# Patient Record
Sex: Female | Born: 1970
Health system: Southern US, Community
[De-identification: ages and names within clinical notes are randomized; demographics above are authoritative.]

## PROBLEM LIST (undated history)

## (undated) DIAGNOSIS — M5430 Sciatica, unspecified side: Secondary | ICD-10-CM

## (undated) DIAGNOSIS — M719 Bursopathy, unspecified: Secondary | ICD-10-CM

## (undated) DIAGNOSIS — R569 Unspecified convulsions: Secondary | ICD-10-CM

## (undated) DIAGNOSIS — F329 Major depressive disorder, single episode, unspecified: Secondary | ICD-10-CM

## (undated) DIAGNOSIS — F4310 Post-traumatic stress disorder, unspecified: Secondary | ICD-10-CM

## (undated) DIAGNOSIS — D259 Leiomyoma of uterus, unspecified: Secondary | ICD-10-CM

## (undated) DIAGNOSIS — I10 Essential (primary) hypertension: Secondary | ICD-10-CM

## (undated) DIAGNOSIS — M1990 Unspecified osteoarthritis, unspecified site: Secondary | ICD-10-CM

## (undated) DIAGNOSIS — M62838 Other muscle spasm: Secondary | ICD-10-CM

## (undated) DIAGNOSIS — K219 Gastro-esophageal reflux disease without esophagitis: Secondary | ICD-10-CM

## (undated) DIAGNOSIS — F32A Depression, unspecified: Secondary | ICD-10-CM

## (undated) DIAGNOSIS — F419 Anxiety disorder, unspecified: Secondary | ICD-10-CM

## (undated) HISTORY — PX: OTHER SURGICAL HISTORY: SHX169

---

## 1998-01-03 ENCOUNTER — Other Ambulatory Visit: Admission: RE | Admit: 1998-01-03 | Discharge: 1998-01-03 | Payer: Self-pay | Admitting: Obstetrics

## 1998-11-27 ENCOUNTER — Other Ambulatory Visit: Admission: RE | Admit: 1998-11-27 | Discharge: 1998-11-27 | Payer: Self-pay | Admitting: Registered Nurse

## 1999-02-18 ENCOUNTER — Inpatient Hospital Stay (HOSPITAL_COMMUNITY): Admission: AD | Admit: 1999-02-18 | Discharge: 1999-02-18 | Payer: Self-pay | Admitting: Obstetrics

## 1999-03-08 ENCOUNTER — Inpatient Hospital Stay (HOSPITAL_COMMUNITY): Admission: AD | Admit: 1999-03-08 | Discharge: 1999-03-08 | Payer: Self-pay | Admitting: Obstetrics

## 1999-04-08 ENCOUNTER — Encounter: Payer: Self-pay | Admitting: Emergency Medicine

## 1999-04-08 ENCOUNTER — Emergency Department (HOSPITAL_COMMUNITY): Admission: EM | Admit: 1999-04-08 | Discharge: 1999-04-08 | Payer: Self-pay | Admitting: Emergency Medicine

## 1999-05-27 ENCOUNTER — Inpatient Hospital Stay (HOSPITAL_COMMUNITY): Admission: AD | Admit: 1999-05-27 | Discharge: 1999-05-27 | Payer: Self-pay | Admitting: Obstetrics

## 1999-05-28 ENCOUNTER — Inpatient Hospital Stay (HOSPITAL_COMMUNITY): Admission: AD | Admit: 1999-05-28 | Discharge: 1999-05-28 | Payer: Self-pay | Admitting: Obstetrics

## 1999-06-22 ENCOUNTER — Inpatient Hospital Stay (HOSPITAL_COMMUNITY): Admission: AD | Admit: 1999-06-22 | Discharge: 1999-06-24 | Payer: Self-pay | Admitting: Obstetrics

## 1999-06-22 ENCOUNTER — Encounter (INDEPENDENT_AMBULATORY_CARE_PROVIDER_SITE_OTHER): Payer: Self-pay | Admitting: Specialist

## 2000-04-28 ENCOUNTER — Emergency Department (HOSPITAL_COMMUNITY): Admission: EM | Admit: 2000-04-28 | Discharge: 2000-04-28 | Payer: Self-pay | Admitting: Internal Medicine

## 2000-10-05 ENCOUNTER — Ambulatory Visit (HOSPITAL_COMMUNITY): Admission: RE | Admit: 2000-10-05 | Discharge: 2000-10-05 | Payer: Self-pay | Admitting: *Deleted

## 2000-10-06 ENCOUNTER — Ambulatory Visit (HOSPITAL_COMMUNITY): Admission: RE | Admit: 2000-10-06 | Discharge: 2000-10-06 | Payer: Self-pay | Admitting: *Deleted

## 2000-10-12 ENCOUNTER — Ambulatory Visit (HOSPITAL_COMMUNITY): Admission: RE | Admit: 2000-10-12 | Discharge: 2000-10-12 | Payer: Self-pay | Admitting: *Deleted

## 2000-10-18 ENCOUNTER — Emergency Department (HOSPITAL_COMMUNITY): Admission: EM | Admit: 2000-10-18 | Discharge: 2000-10-19 | Payer: Self-pay | Admitting: Emergency Medicine

## 2003-07-24 ENCOUNTER — Encounter: Admission: RE | Admit: 2003-07-24 | Discharge: 2003-08-24 | Payer: Self-pay | Admitting: Family Medicine

## 2003-08-06 ENCOUNTER — Encounter: Admission: RE | Admit: 2003-08-06 | Discharge: 2003-11-04 | Payer: Self-pay | Admitting: Family Medicine

## 2004-08-15 ENCOUNTER — Emergency Department (HOSPITAL_COMMUNITY): Admission: EM | Admit: 2004-08-15 | Discharge: 2004-08-15 | Payer: Self-pay | Admitting: Emergency Medicine

## 2004-10-23 ENCOUNTER — Ambulatory Visit (HOSPITAL_COMMUNITY): Admission: RE | Admit: 2004-10-23 | Discharge: 2004-10-23 | Payer: Self-pay | Admitting: Obstetrics

## 2004-10-31 ENCOUNTER — Ambulatory Visit (HOSPITAL_COMMUNITY): Admission: RE | Admit: 2004-10-31 | Discharge: 2004-10-31 | Payer: Self-pay | Admitting: Internal Medicine

## 2005-09-08 ENCOUNTER — Emergency Department (HOSPITAL_COMMUNITY): Admission: EM | Admit: 2005-09-08 | Discharge: 2005-09-08 | Payer: Self-pay | Admitting: *Deleted

## 2006-12-08 ENCOUNTER — Emergency Department (HOSPITAL_COMMUNITY): Admission: EM | Admit: 2006-12-08 | Discharge: 2006-12-09 | Payer: Self-pay | Admitting: Emergency Medicine

## 2007-07-25 ENCOUNTER — Emergency Department: Payer: Self-pay | Admitting: Emergency Medicine

## 2007-08-05 ENCOUNTER — Emergency Department (HOSPITAL_COMMUNITY): Admission: EM | Admit: 2007-08-05 | Discharge: 2007-08-05 | Payer: Self-pay | Admitting: Emergency Medicine

## 2008-07-20 ENCOUNTER — Emergency Department (HOSPITAL_COMMUNITY): Admission: EM | Admit: 2008-07-20 | Discharge: 2008-07-20 | Payer: Self-pay | Admitting: Emergency Medicine

## 2008-12-26 ENCOUNTER — Emergency Department (HOSPITAL_COMMUNITY): Admission: EM | Admit: 2008-12-26 | Discharge: 2008-12-26 | Payer: Self-pay | Admitting: Emergency Medicine

## 2009-02-20 ENCOUNTER — Emergency Department (HOSPITAL_COMMUNITY): Admission: EM | Admit: 2009-02-20 | Discharge: 2009-02-20 | Payer: Self-pay | Admitting: Emergency Medicine

## 2009-12-02 ENCOUNTER — Ambulatory Visit (HOSPITAL_COMMUNITY): Admission: RE | Admit: 2009-12-02 | Discharge: 2009-12-02 | Payer: Self-pay | Admitting: Internal Medicine

## 2010-01-18 ENCOUNTER — Emergency Department (HOSPITAL_COMMUNITY): Admission: EM | Admit: 2010-01-18 | Discharge: 2010-01-19 | Payer: Self-pay | Admitting: Emergency Medicine

## 2010-04-02 ENCOUNTER — Emergency Department (HOSPITAL_COMMUNITY)
Admission: EM | Admit: 2010-04-02 | Discharge: 2010-04-02 | Payer: Self-pay | Source: Home / Self Care | Admitting: Emergency Medicine

## 2010-05-04 ENCOUNTER — Encounter: Payer: Self-pay | Admitting: Obstetrics

## 2010-05-29 ENCOUNTER — Emergency Department (HOSPITAL_COMMUNITY): Payer: Medicaid Other

## 2010-05-29 ENCOUNTER — Emergency Department (HOSPITAL_COMMUNITY)
Admission: EM | Admit: 2010-05-29 | Discharge: 2010-05-29 | Disposition: A | Discharge status: Home / Self Care | Payer: Medicaid Other | Attending: Emergency Medicine | Admitting: Emergency Medicine

## 2010-05-29 DIAGNOSIS — R059 Cough, unspecified: Secondary | ICD-10-CM | POA: Insufficient documentation

## 2010-05-29 DIAGNOSIS — F329 Major depressive disorder, single episode, unspecified: Secondary | ICD-10-CM | POA: Insufficient documentation

## 2010-05-29 DIAGNOSIS — M069 Rheumatoid arthritis, unspecified: Secondary | ICD-10-CM | POA: Insufficient documentation

## 2010-05-29 DIAGNOSIS — K219 Gastro-esophageal reflux disease without esophagitis: Secondary | ICD-10-CM | POA: Insufficient documentation

## 2010-05-29 DIAGNOSIS — R05 Cough: Secondary | ICD-10-CM | POA: Insufficient documentation

## 2010-05-29 DIAGNOSIS — I10 Essential (primary) hypertension: Secondary | ICD-10-CM | POA: Insufficient documentation

## 2010-05-29 DIAGNOSIS — F3289 Other specified depressive episodes: Secondary | ICD-10-CM | POA: Insufficient documentation

## 2010-05-29 DIAGNOSIS — J069 Acute upper respiratory infection, unspecified: Secondary | ICD-10-CM | POA: Insufficient documentation

## 2010-06-23 LAB — POCT I-STAT, CHEM 8
Calcium, Ion: 1.18 mmol/L (ref 1.12–1.32)
Glucose, Bld: 90 mg/dL (ref 70–99)
HCT: 39 % (ref 36.0–46.0)
Sodium: 141 mEq/L (ref 135–145)

## 2010-06-26 LAB — WET PREP, GENITAL: Trich, Wet Prep: NONE SEEN

## 2010-06-26 LAB — GC/CHLAMYDIA PROBE AMP, GENITAL: Chlamydia, DNA Probe: NEGATIVE

## 2010-07-23 LAB — CBC
HCT: 39.2 % (ref 36.0–46.0)
Hemoglobin: 13.1 g/dL (ref 12.0–15.0)
MCV: 84.4 fL (ref 78.0–100.0)
RBC: 4.65 MIL/uL (ref 3.87–5.11)

## 2010-07-23 LAB — URINALYSIS, ROUTINE W REFLEX MICROSCOPIC
Ketones, ur: NEGATIVE mg/dL
Protein, ur: NEGATIVE mg/dL

## 2010-07-23 LAB — DIFFERENTIAL
Basophils Relative: 2 % — ABNORMAL HIGH (ref 0–1)
Eosinophils Absolute: 0.1 10*3/uL (ref 0.0–0.7)
Lymphocytes Relative: 17 % (ref 12–46)
Lymphs Abs: 1.8 10*3/uL (ref 0.7–4.0)
Monocytes Relative: 4 % (ref 3–12)
Neutro Abs: 8.2 10*3/uL — ABNORMAL HIGH (ref 1.7–7.7)
Neutrophils Relative %: 77 % (ref 43–77)

## 2010-07-23 LAB — POCT I-STAT, CHEM 8
BUN: 4 mg/dL — ABNORMAL LOW (ref 6–23)
Calcium, Ion: 1.18 mmol/L (ref 1.12–1.32)
Chloride: 106 mEq/L (ref 96–112)
HCT: 43 % (ref 36.0–46.0)
Hemoglobin: 14.6 g/dL (ref 12.0–15.0)
Potassium: 4.1 mEq/L (ref 3.5–5.1)

## 2010-07-23 LAB — GC/CHLAMYDIA PROBE AMP, GENITAL
Chlamydia, DNA Probe: NEGATIVE
GC Probe Amp, Genital: NEGATIVE

## 2010-07-23 LAB — WET PREP, GENITAL: Clue Cells Wet Prep HPF POC: NONE SEEN

## 2010-07-23 LAB — POCT PREGNANCY, URINE: Preg Test, Ur: NEGATIVE

## 2010-08-29 NOTE — Op Note (Signed)
[facility]  Patient:    Margaret Owens, Margaret Owens                      MRN: [id number] Proc. Date: 06/23/99 Adm. Date:  [id number] Attending:  Venita Sheffield                           Operative Report  PREOPERATIVE DIAGNOSIS:       Multiparity.  OPERATION:                    Postpartum tubal ligation.  ANESTHESIA:                   Epidural.  DESCRIPTION OF PROCEDURE:     Using epidural with the patient in the supine position the abdomen was prepared and draped.  The bladder was emptied with a straight catheter.  A midline subumbilical incision was made and carried out to the fascia and the fascia cleaned, grasped with two corticals of the fascia and the  peritoneum opened with the Mayo scissors.  The left tube was traced to the fimbriated end.  A 0 plain suture was placed in the mesosalpinx below the portion of tube within the Babcock clamp.  Approximately 1 inch of tube was transected ith hemostasis satisfactory.  The procedure was done in a similar fashion on the other side.  The abdomen closed ______ peritoneum fascia continuous of 0 Dexon and the skin closed with subcuticular suture of 3-0 plain.  The patient tolerated the procedure well and was taken to the recovery room in good condition. DD:  06/23/99 TD:  06/23/99 Job: [id number]

## 2010-08-29 NOTE — Discharge Summary (Signed)
[facility]  Patient:    Margaret Owens, Margaret Owens                      MRN: [id number] Adm. Date:  [id number] Disc. Date: 06/24/99 Attending:  Venita Sheffield                           Discharge Summary  HISTORY OF PRESENT ILLNESS:   The patient is a 40 year old, gravida 3, para 2-0-0-2, [facility] June 23, 1999, admitted in labor.  HOSPITAL COURSE:              She had a normal spontaneous vaginal delivery of  female, Apgars of 9 and 9.  First degree perineal repaired with 2-0 Vicryl.  The patient did well and desired a postpartum tubal ligation.  This she underwent on March 12.  Her hemoglobin was 11.1.  She did well and was discharged home on the second postpartum day, ambulatory, on a regular diet, and on Tylenol No.3 one q.4h. p.r.n.  She is to see me in six weeks.  DISCHARGE DIAGNOSIS:          Status post normal spontaneous vaginal delivery at term and postpartum tubal ligation. DD:  06/24/99 TD:  06/24/99 Job: [id number]

## 2011-01-06 LAB — RPR: RPR Ser Ql: NONREACTIVE

## 2011-01-06 LAB — URINALYSIS, ROUTINE W REFLEX MICROSCOPIC
Glucose, UA: NEGATIVE
Protein, ur: NEGATIVE
Specific Gravity, Urine: 1.031 — ABNORMAL HIGH

## 2011-01-06 LAB — WET PREP, GENITAL
Trich, Wet Prep: NONE SEEN
Yeast Wet Prep HPF POC: NONE SEEN

## 2011-01-06 LAB — POCT PREGNANCY, URINE: Operator id: 28014

## 2011-01-06 LAB — GC/CHLAMYDIA PROBE AMP, GENITAL: Chlamydia, DNA Probe: NEGATIVE

## 2011-01-23 LAB — COMPREHENSIVE METABOLIC PANEL
ALT: 13
AST: 16
CO2: 26
Calcium: 9.5
GFR calc Af Amer: >60
GFR calc non Af Amer: >60
Glucose, Bld: 97
Potassium: 4.2
Sodium: 140
Total Protein: 8.1

## 2011-01-23 LAB — COMPREHENSIVE METABOLIC PANEL WITH GFR
Albumin: 3.7
Alkaline Phosphatase: 67
BUN: 6
Chloride: 107
Creatinine, Ser: 0.63
Total Bilirubin: 0.6

## 2011-01-23 LAB — URINALYSIS, ROUTINE W REFLEX MICROSCOPIC
Bilirubin Urine: NEGATIVE
Glucose, UA: NEGATIVE
Hgb urine dipstick: NEGATIVE
Ketones, ur: NEGATIVE
Nitrite: NEGATIVE
Protein, ur: NEGATIVE
Specific Gravity, Urine: 1.021
Urobilinogen, UA: 1
pH: 6

## 2011-01-23 LAB — DIFFERENTIAL
Basophils Absolute: 0.1
Basophils Relative: 1
Eosinophils Absolute: 0.2
Eosinophils Relative: 2
Lymphocytes Relative: 27
Lymphs Abs: 2.8
Monocytes Absolute: 0.6
Monocytes Relative: 5
Neutro Abs: 6.8
Neutrophils Relative %: 65

## 2011-01-23 LAB — CBC
HCT: 38
Hemoglobin: 12.7
MCHC: 33.5
MCV: 83.5
Platelets: 445 — ABNORMAL HIGH
RBC: 4.55
RDW: 14.1 — ABNORMAL HIGH
WBC: 10.5

## 2011-01-23 LAB — RPR: RPR Ser Ql: NONREACTIVE

## 2011-01-23 LAB — GC/CHLAMYDIA PROBE AMP, GENITAL
Chlamydia, DNA Probe: NEGATIVE
GC Probe Amp, Genital: NEGATIVE

## 2011-01-23 LAB — WET PREP, GENITAL: Yeast Wet Prep HPF POC: NONE SEEN

## 2011-01-23 LAB — LIPASE, BLOOD: Lipase: 18

## 2011-01-23 LAB — POCT PREGNANCY, URINE
Operator id: 244461
Preg Test, Ur: NEGATIVE

## 2011-07-23 ENCOUNTER — Other Ambulatory Visit (HOSPITAL_COMMUNITY): Payer: Self-pay | Admitting: Obstetrics

## 2011-07-23 DIAGNOSIS — Z1231 Encounter for screening mammogram for malignant neoplasm of breast: Secondary | ICD-10-CM

## 2011-08-20 ENCOUNTER — Ambulatory Visit (HOSPITAL_COMMUNITY)
Admission: RE | Admit: 2011-08-20 | Discharge: 2011-08-20 | Disposition: A | Discharge status: Home / Self Care | Payer: Medicaid Other | Source: Ambulatory Visit | Attending: Obstetrics | Admitting: Obstetrics

## 2011-08-20 DIAGNOSIS — Z1231 Encounter for screening mammogram for malignant neoplasm of breast: Secondary | ICD-10-CM | POA: Insufficient documentation

## 2011-08-30 ENCOUNTER — Emergency Department (HOSPITAL_COMMUNITY)
Admission: EM | Admit: 2011-08-30 | Discharge: 2011-08-30 | Disposition: A | Discharge status: Home / Self Care | Payer: Medicaid Other | Attending: Emergency Medicine | Admitting: Emergency Medicine

## 2011-08-30 ENCOUNTER — Encounter (HOSPITAL_COMMUNITY): Payer: Self-pay

## 2011-08-30 DIAGNOSIS — S025XXA Fracture of tooth (traumatic), initial encounter for closed fracture: Secondary | ICD-10-CM

## 2011-08-30 DIAGNOSIS — M129 Arthropathy, unspecified: Secondary | ICD-10-CM | POA: Insufficient documentation

## 2011-08-30 DIAGNOSIS — I10 Essential (primary) hypertension: Secondary | ICD-10-CM | POA: Insufficient documentation

## 2011-08-30 DIAGNOSIS — J45909 Unspecified asthma, uncomplicated: Secondary | ICD-10-CM | POA: Insufficient documentation

## 2011-08-30 DIAGNOSIS — X58XXXA Exposure to other specified factors, initial encounter: Secondary | ICD-10-CM | POA: Insufficient documentation

## 2011-08-30 HISTORY — DX: Bursopathy, unspecified: M71.9

## 2011-08-30 HISTORY — DX: Unspecified osteoarthritis, unspecified site: M19.90

## 2011-08-30 HISTORY — DX: Essential (primary) hypertension: I10

## 2011-08-30 HISTORY — DX: Gastro-esophageal reflux disease without esophagitis: K21.9

## 2011-08-30 NOTE — ED Notes (Signed)
Pt states eating peanuts and broke tooth. Today having increase in pain.

## 2011-08-30 NOTE — ED Notes (Signed)
Pt ambulates without distress. NAD

## 2011-08-30 NOTE — Discharge Instructions (Signed)
Apply orthodontic wax to your broken tooth to make it smooth.  Take ibuprofen for tongue irritation.  Call the dentist in the morning to schedule a follow up appointment.  You may return to the ER if your symptoms change or worsen.

## 2011-08-30 NOTE — ED Provider Notes (Signed)
History     CSN: [id number]  Arrival date & time 08/30/11  1112   First MD Initiated Contact with Patient 08/30/11 1224      Chief Complaint  Patient presents with  . Dental Injury    (Consider location/radiation/quality/duration/timing/severity/associated sxs/prior treatment) HPI History provided by pt.   Pt broke a piece of left lower tooth off yesterday evening while eating [employer].  Tooth is not painful but remaining tooth is sharp and irritating the left side of her tongue.  She is unable to eat d/t irritation.  No associated sx. Has not taken anything for pain.  Does not currently have a dentist.    Past Medical History  Diagnosis Date  . Arthritis   . Asthma   . Bursitis   . GERD (gastroesophageal reflux disease)   . Hypertension     No past surgical history on file.  Family History  Problem Relation Age of Onset  . Osteoarthritis Mother   . Cancer Mother     History  Substance Use Topics  . Smoking status: Never Smoker   . Smokeless tobacco: Not on file  . Alcohol Use: No    OB History    Grav Para Term Preterm Abortions TAB SAB Ect Mult Living                  Review of Systems  All other systems reviewed and are negative.    Allergies  Penicillins  Home Medications   Current Outpatient Rx  Name Route Sig Dispense Refill  . CETIRIZINE HCL 10 MG PO TABS Oral Take 10 mg by mouth daily.    Marland Kitchen CITALOPRAM HYDROBROMIDE 10 MG PO TABS Oral Take 10 mg by mouth daily.    Marland Kitchen CLONIDINE HCL 0.1 MG PO TABS Oral Take 0.1 mg by mouth 2 (two) times daily.    Marland Kitchen FLUTICASONE PROPIONATE 50 MCG/ACT NA SUSP Nasal Place 2 sprays into the nose daily.    . MELOXICAM 15 MG PO TABS Oral Take 15 mg by mouth daily.    Marland Kitchen OMEPRAZOLE 20 MG PO CPDR Oral Take 20 mg by mouth daily.    Marland Kitchen PHENTERMINE HCL 37.5 MG PO CAPS Oral Take 37.5 mg by mouth every morning.    . TRAZODONE HCL 50 MG PO TABS Oral Take 50 mg by mouth at bedtime.      BP 114/60  Pulse 74  Temp(Src)  98.6 F (37 C) (Oral)  Resp 18  Ht 5\' 9"  (1.753 m)  Wt 235 lb (106.595 kg)  BMI 34.70 kg/m2  SpO2 100%  LMP 08/11/2011  Physical Exam  Nursing note and vitals reviewed. Constitutional: She is oriented to person, place, and time. She appears well-developed and well-nourished. No distress.  HENT:  Head: Normocephalic and atraumatic.       Partial avulsion medial left lower 1st molar.  Mild decay of remaining tooth.  Mildly ttp.  Tongue appears nml.    Eyes:       Normal appearance  Neck: Normal range of motion.  Pulmonary/Chest: Effort normal.  Musculoskeletal: Normal range of motion.  Neurological: She is alert and oriented to person, place, and time.  Psychiatric: She has a normal mood and affect. Her behavior is normal.    ED Course  Procedures (including critical care time)  Labs Reviewed - No data to display No results found.   1. Avulsion fracture of tooth       MDM  41yo F presents w/ partial  avulsion left lower 1st molar and remaining fragment is irritating her tongue.  Recommended ortho wax to cover tooth and chloraseptic spray to relieve tongue pain.  Referred to dentist on call and provided w/ list of several low cost dentists.        Arie Sabina [place], [place] 08/30/11 1701

## 2011-09-02 NOTE — ED Provider Notes (Signed)
Medical screening examination/treatment/procedure(s) were performed by non-physician practitioner and as supervising physician I was immediately available for consultation/collaboration.  Noe Goyer, MD 09/02/11 0910 

## 2011-12-18 ENCOUNTER — Other Ambulatory Visit: Payer: Self-pay | Admitting: Sports Medicine

## 2011-12-18 DIAGNOSIS — M545 Low back pain, unspecified: Secondary | ICD-10-CM

## 2011-12-18 DIAGNOSIS — M25559 Pain in unspecified hip: Secondary | ICD-10-CM

## 2011-12-24 ENCOUNTER — Other Ambulatory Visit: Payer: Medicaid Other

## 2011-12-24 ENCOUNTER — Inpatient Hospital Stay: Admission: RE | Admit: 2011-12-24 | Payer: Medicaid Other | Source: Ambulatory Visit

## 2012-01-14 ENCOUNTER — Ambulatory Visit: Payer: Medicaid Other | Attending: Sports Medicine | Admitting: Physical Therapy

## 2012-01-14 DIAGNOSIS — R293 Abnormal posture: Secondary | ICD-10-CM | POA: Insufficient documentation

## 2012-01-14 DIAGNOSIS — M2550 Pain in unspecified joint: Secondary | ICD-10-CM | POA: Insufficient documentation

## 2012-01-14 DIAGNOSIS — IMO0001 Reserved for inherently not codable concepts without codable children: Secondary | ICD-10-CM | POA: Insufficient documentation

## 2012-01-29 ENCOUNTER — Ambulatory Visit: Payer: Medicaid Other | Admitting: Rehabilitation

## 2012-02-05 ENCOUNTER — Ambulatory Visit: Payer: Medicaid Other | Admitting: Rehabilitation

## 2012-02-12 ENCOUNTER — Encounter: Payer: Medicaid Other | Admitting: Rehabilitation

## 2012-02-19 ENCOUNTER — Ambulatory Visit: Payer: Medicaid Other | Attending: Sports Medicine | Admitting: Rehabilitation

## 2012-02-19 DIAGNOSIS — R293 Abnormal posture: Secondary | ICD-10-CM | POA: Insufficient documentation

## 2012-02-19 DIAGNOSIS — M2550 Pain in unspecified joint: Secondary | ICD-10-CM | POA: Insufficient documentation

## 2012-02-19 DIAGNOSIS — IMO0001 Reserved for inherently not codable concepts without codable children: Secondary | ICD-10-CM | POA: Insufficient documentation

## 2012-10-23 ENCOUNTER — Encounter (HOSPITAL_COMMUNITY): Payer: Self-pay

## 2012-10-23 ENCOUNTER — Emergency Department (HOSPITAL_COMMUNITY)
Admission: EM | Admit: 2012-10-23 | Discharge: 2012-10-23 | Disposition: A | Discharge status: Home / Self Care | Payer: Medicaid Other | Attending: Emergency Medicine | Admitting: Emergency Medicine

## 2012-10-23 DIAGNOSIS — K219 Gastro-esophageal reflux disease without esophagitis: Secondary | ICD-10-CM | POA: Insufficient documentation

## 2012-10-23 DIAGNOSIS — G8929 Other chronic pain: Secondary | ICD-10-CM | POA: Insufficient documentation

## 2012-10-23 DIAGNOSIS — B351 Tinea unguium: Secondary | ICD-10-CM | POA: Insufficient documentation

## 2012-10-23 DIAGNOSIS — Z79899 Other long term (current) drug therapy: Secondary | ICD-10-CM | POA: Insufficient documentation

## 2012-10-23 DIAGNOSIS — Z880 Allergy status to penicillin: Secondary | ICD-10-CM | POA: Insufficient documentation

## 2012-10-23 DIAGNOSIS — IMO0002 Reserved for concepts with insufficient information to code with codable children: Secondary | ICD-10-CM | POA: Insufficient documentation

## 2012-10-23 DIAGNOSIS — M2550 Pain in unspecified joint: Secondary | ICD-10-CM | POA: Insufficient documentation

## 2012-10-23 DIAGNOSIS — M549 Dorsalgia, unspecified: Secondary | ICD-10-CM | POA: Insufficient documentation

## 2012-10-23 DIAGNOSIS — J45909 Unspecified asthma, uncomplicated: Secondary | ICD-10-CM | POA: Insufficient documentation

## 2012-10-23 DIAGNOSIS — M25559 Pain in unspecified hip: Secondary | ICD-10-CM | POA: Insufficient documentation

## 2012-10-23 DIAGNOSIS — I10 Essential (primary) hypertension: Secondary | ICD-10-CM | POA: Insufficient documentation

## 2012-10-23 MED ORDER — CICLOPIROX OLAMINE 0.77 % EX CREA: TOPICAL_CREAM | Freq: Two times a day (BID) | CUTANEOUS | Status: DC

## 2012-10-23 MED ORDER — MELOXICAM 15 MG PO TABS: 15.0000 mg | ORAL_TABLET | Freq: Every day | ORAL | Status: DC

## 2012-10-23 MED ORDER — HYDROCODONE-ACETAMINOPHEN 5-325 MG PO TABS: 1.0000 | ORAL_TABLET | Freq: Four times a day (QID) | ORAL | Status: DC | PRN

## 2012-10-23 NOTE — ED Notes (Signed)
MD at bedside.  EDPA Herbert Seta present to evaluate this pt

## 2012-10-23 NOTE — ED Notes (Signed)
She c/o worsening of chronic bilat. Hip pain x ~2 weeks.  She sees Dr. Farris Has (ortho) for this.  She also tells [place] she has run out of her hydrocodone recently.  She denies any recent trauma, but cites much walking.

## 2012-10-23 NOTE — ED Provider Notes (Signed)
History  This chart was scribed for non-physician practitioner working with Gerhard Munch, MD by Greggory Stallion, ED scribe. This patient was seen in room WTR7/WTR7 and the patient's care was started at 3:58 PM.  CSN: [id number] Arrival date & time 10/23/12  1205   Chief Complaint  Patient presents with  . Hip Pain   The history is provided by the patient and medical records. No language interpreter was used.    HPI Comments: Margaret Owens is a 42 y.o. female with h/o HTN and arthritis who presents to the Emergency Department complaining of chronic bilateral hip pain that started 2 weeks ago. She states the pain feels like she pulled a muscle. Pt denies injury or trauma. She states she has been having to walk a lot more because she doesn't have her car. 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 She is treated by Dr. Farris Has for her pain management. She also takes Mobic daily and uses both here and gel which are helping. Walking makes the pain worse. She also reports mild low back pain. Pt states the 1st digit on her right foot has some pain because her toenail is loose. She states she has not snagged her toe on anything. Pt states she has run out of her hydrocodone recently. Pt states the orthopedist is calling in more medication. She states she does not have an appointment any time soon with Dr. Farris Has.   Orthopaedist is Dr. Pati Gallo   Past Medical History  Diagnosis Date  . Arthritis   . Asthma   . Bursitis   . GERD (gastroesophageal reflux disease)   . Hypertension    History reviewed. No pertinent past surgical history. Family History  Problem Relation Age of Onset  . Osteoarthritis Mother   . Cancer Mother    History  Substance Use Topics  . Smoking status: Never Smoker   . Smokeless tobacco: Not on file  . Alcohol Use: No   OB History   Grav Para Term Preterm Abortions TAB SAB Ect Mult Living                  Review of Systems  Musculoskeletal: Positive for back pain and arthralgias. Negative for joint swelling.  All other systems reviewed and are negative.    Allergies  Penicillins  Home Medications   Current Outpatient Rx  Name  Route  Sig  Dispense  Refill  . albuterol (PROVENTIL HFA;VENTOLIN HFA) 108 (90 BASE) MCG/ACT inhaler   Inhalation   Inhale 2 puffs into the lungs every 6 (six) hours as needed for wheezing.         . cetirizine (ZYRTEC) 10 MG tablet   Oral   Take 10 mg by mouth daily.         . citalopram (CELEXA) 10 MG tablet   Oral   Take 10 mg by mouth daily.         . diclofenac sodium (VOLTAREN) 1 % GEL   Topical   Apply 2 g topically 4 (four) times daily as needed (apply to leg).         . fluticasone (FLONASE) 50 MCG/ACT nasal spray   Nasal   Place 2 sprays into the nose daily.         Marland Kitchen omeprazole (PRILOSEC) 20 MG capsule   Oral   Take 20 mg by mouth daily.         . phentermine 37.5  MG capsule   Oral   Take 37.5 mg by mouth every morning.         . traZODone (DESYREL) 50 MG tablet   Oral   Take 50 mg by mouth at bedtime.         . ciclopirox (LOPROX) 0.77 % cream   Topical   Apply topically 2 (two) times daily.   15 g   0   . HYDROcodone-acetaminophen (NORCO/VICODIN) 5-325 MG per tablet   Oral   Take 1 tablet by mouth every 6 (six) hours as needed for pain.   10 tablet   0   . meloxicam (MOBIC) 15 MG tablet   Oral   Take 1 tablet (15 mg total) by mouth daily.   30 tablet   0    BP 122/86  Pulse 73  Temp(Src) 98.4 F (36.9 C) (Oral)  Resp 14  SpO2 100%  Physical Exam  Nursing note and vitals reviewed. Constitutional: She appears well-developed and well-nourished. No distress.  HENT:  Head: Normocephalic and atraumatic.  Mouth/Throat: Oropharynx is clear and moist. No oropharyngeal exudate.  Eyes: Conjunctivae are normal.  Neck: Normal range of motion. Neck supple.  Full ROM without pain No paraspinal or  midline tenderness  Cardiovascular: Normal rate, regular rhythm, normal heart sounds and intact distal pulses.   No murmur heard. Capillary refill < 3   Pulmonary/Chest: Effort normal and breath sounds normal. No respiratory distress. She has no wheezes.  Clear and equal breath sounds.   Musculoskeletal: She exhibits tenderness. She exhibits no edema.  Full range of motion of the T-spine and L-spine No tenderness to palpation of the spinous processes of the T-spine or L-spine Mild tenderness to palpation of the paraspinous muscles of the L-spine ROM: full ROM of hips and knees.  Tender to palpation on lateral hips.   Lymphadenopathy:    She has no cervical adenopathy.  Neurological: She is alert. She has normal reflexes. Coordination normal.  Speech is clear and goal oriented, follows commands Normal strength in upper and lower extremities bilaterally including dorsiflexion and plantar flexion, strong and equal grip strength Sensation normal to light and sharp touch Moves extremities without ataxia, coordination intact Normal gait Normal balance   Skin: Skin is warm and dry. No rash noted. She is not diaphoretic. No erythema.  No tenting of the skin Right great toe with discolored (white-brown), thickened, opacified nail; dystrophic edges, hyperkeratosis and mild onycholysis; no other nail involvement   Psychiatric: She has a normal mood and affect.    ED Course  Procedures (including critical care time)  DIAGNOSTIC STUDIES: Oxygen Saturation is 100% on RA, normal by my interpretation.    COORDINATION OF CARE: 4:07 PM-Discussed treatment plan which includes pain medication and fungal cream for her right toe with pt at bedside and pt agreed to plan.   Labs Reviewed - No data to display No results found. 1. Chronic hip pain, left   2. Chronic hip pain, right   3. Onychomycosis     MDM  Margaret Owens presents with acute exacerbation of chronic hip pain likely due to  significant increase in the amount of walking she is doing each day.  X-rays not indicated as there has been no trauma, fall or specific injury. Pain managed in ED. Pt advised to follow up with orthopedics for further chronic pain management. Patient also with onchomycosis of the right great toenail without evidence of spread to other nails. We'll begin topical treatment and  have her discuss with primary care for oral treatment options. Conservative therapy recommended and discussed. Patient will be dc home & is agreeable with above plan.  I personally performed the services described in this documentation, which was scribed in my presence. The recorded information has been reviewed and is accurate.    Dahlia Client Katianna Mcclenney, PA-C 10/23/12 1620

## 2012-10-24 NOTE — ED Provider Notes (Signed)
  Medical screening examination/treatment/procedure(s) were performed by non-physician practitioner and as supervising physician I was immediately available for consultation/collaboration.    Shishir Krantz, MD 10/24/12 0001 

## 2013-01-27 ENCOUNTER — Other Ambulatory Visit: Payer: Self-pay | Admitting: Sports Medicine

## 2013-01-27 DIAGNOSIS — M545 Low back pain, unspecified: Secondary | ICD-10-CM

## 2013-01-27 DIAGNOSIS — M25552 Pain in left hip: Secondary | ICD-10-CM

## 2013-02-05 ENCOUNTER — Ambulatory Visit
Admission: RE | Admit: 2013-02-05 | Discharge: 2013-02-05 | Disposition: A | Discharge status: Home / Self Care | Payer: Medicaid Other | Source: Ambulatory Visit | Attending: Sports Medicine | Admitting: Sports Medicine

## 2013-02-05 ENCOUNTER — Ambulatory Visit: Payer: Medicaid Other

## 2013-02-05 DIAGNOSIS — M25552 Pain in left hip: Secondary | ICD-10-CM

## 2013-02-05 DIAGNOSIS — M545 Low back pain, unspecified: Secondary | ICD-10-CM

## 2013-02-09 ENCOUNTER — Ambulatory Visit
Admission: RE | Admit: 2013-02-09 | Discharge: 2013-02-09 | Disposition: A | Discharge status: Home / Self Care | Payer: Medicaid Other | Source: Ambulatory Visit | Attending: Sports Medicine | Admitting: Sports Medicine

## 2013-02-09 DIAGNOSIS — M545 Low back pain, unspecified: Secondary | ICD-10-CM

## 2013-02-09 DIAGNOSIS — M25552 Pain in left hip: Secondary | ICD-10-CM

## 2013-03-22 ENCOUNTER — Other Ambulatory Visit: Payer: Self-pay | Admitting: Internal Medicine

## 2013-03-22 DIAGNOSIS — K3189 Other diseases of stomach and duodenum: Secondary | ICD-10-CM

## 2013-03-30 ENCOUNTER — Other Ambulatory Visit: Payer: Self-pay

## 2013-03-30 DIAGNOSIS — Z1231 Encounter for screening mammogram for malignant neoplasm of breast: Secondary | ICD-10-CM

## 2013-04-11 ENCOUNTER — Ambulatory Visit
Admission: RE | Admit: 2013-04-11 | Discharge: 2013-04-11 | Disposition: A | Discharge status: Home / Self Care | Payer: Medicaid Other | Source: Ambulatory Visit

## 2013-04-11 DIAGNOSIS — Z1231 Encounter for screening mammogram for malignant neoplasm of breast: Secondary | ICD-10-CM

## 2013-04-19 ENCOUNTER — Ambulatory Visit
Admission: RE | Admit: 2013-04-19 | Discharge: 2013-04-19 | Disposition: A | Discharge status: Home / Self Care | Payer: Medicaid Other | Source: Ambulatory Visit | Attending: Internal Medicine | Admitting: Internal Medicine

## 2013-04-19 DIAGNOSIS — R1013 Epigastric pain: Principal | ICD-10-CM

## 2013-04-19 DIAGNOSIS — K3189 Other diseases of stomach and duodenum: Secondary | ICD-10-CM

## 2013-04-30 ENCOUNTER — Emergency Department (HOSPITAL_COMMUNITY): Payer: Medicaid Other

## 2013-04-30 ENCOUNTER — Emergency Department (HOSPITAL_COMMUNITY)
Admission: EM | Admit: 2013-04-30 | Discharge: 2013-04-30 | Disposition: A | Discharge status: Home / Self Care | Payer: Medicaid Other | Attending: Emergency Medicine | Admitting: Emergency Medicine

## 2013-04-30 ENCOUNTER — Encounter (HOSPITAL_COMMUNITY): Payer: Self-pay | Admitting: Emergency Medicine

## 2013-04-30 DIAGNOSIS — R52 Pain, unspecified: Secondary | ICD-10-CM | POA: Insufficient documentation

## 2013-04-30 DIAGNOSIS — I10 Essential (primary) hypertension: Secondary | ICD-10-CM | POA: Insufficient documentation

## 2013-04-30 DIAGNOSIS — M129 Arthropathy, unspecified: Secondary | ICD-10-CM | POA: Insufficient documentation

## 2013-04-30 DIAGNOSIS — M7150 Other bursitis, not elsewhere classified, unspecified site: Secondary | ICD-10-CM | POA: Insufficient documentation

## 2013-04-30 DIAGNOSIS — Z880 Allergy status to penicillin: Secondary | ICD-10-CM | POA: Insufficient documentation

## 2013-04-30 DIAGNOSIS — J029 Acute pharyngitis, unspecified: Secondary | ICD-10-CM | POA: Insufficient documentation

## 2013-04-30 DIAGNOSIS — J3489 Other specified disorders of nose and nasal sinuses: Secondary | ICD-10-CM | POA: Insufficient documentation

## 2013-04-30 DIAGNOSIS — Z79899 Other long term (current) drug therapy: Secondary | ICD-10-CM | POA: Insufficient documentation

## 2013-04-30 DIAGNOSIS — J111 Influenza due to unidentified influenza virus with other respiratory manifestations: Secondary | ICD-10-CM

## 2013-04-30 DIAGNOSIS — R69 Illness, unspecified: Secondary | ICD-10-CM

## 2013-04-30 DIAGNOSIS — J45909 Unspecified asthma, uncomplicated: Secondary | ICD-10-CM | POA: Insufficient documentation

## 2013-04-30 DIAGNOSIS — R109 Unspecified abdominal pain: Secondary | ICD-10-CM | POA: Insufficient documentation

## 2013-04-30 DIAGNOSIS — R197 Diarrhea, unspecified: Secondary | ICD-10-CM | POA: Insufficient documentation

## 2013-04-30 DIAGNOSIS — R509 Fever, unspecified: Secondary | ICD-10-CM | POA: Insufficient documentation

## 2013-04-30 DIAGNOSIS — K219 Gastro-esophageal reflux disease without esophagitis: Secondary | ICD-10-CM | POA: Insufficient documentation

## 2013-04-30 DIAGNOSIS — R112 Nausea with vomiting, unspecified: Secondary | ICD-10-CM | POA: Insufficient documentation

## 2013-04-30 LAB — PREGNANCY, URINE: PREG TEST UR: NEGATIVE

## 2013-04-30 LAB — CBC WITH DIFFERENTIAL/PLATELET
Basophils Absolute: 0 10*3/uL (ref 0.0–0.1)
Basophils Relative: 0 % (ref 0–1)
EOS PCT: 1 % (ref 0–5)
Eosinophils Absolute: 0.1 10*3/uL (ref 0.0–0.7)
HEMATOCRIT: 37.5 % (ref 36.0–46.0)
HEMOGLOBIN: 12.6 g/dL (ref 12.0–15.0)
LYMPHS ABS: 1.8 10*3/uL (ref 0.7–4.0)
LYMPHS PCT: 35 % (ref 12–46)
MCH: 28.8 pg (ref 26.0–34.0)
MCHC: 33.6 g/dL (ref 30.0–36.0)
MCV: 85.8 fL (ref 78.0–100.0)
MONO ABS: 0.5 10*3/uL (ref 0.1–1.0)
Monocytes Relative: 9 % (ref 3–12)
Neutro Abs: 2.8 10*3/uL (ref 1.7–7.7)
Neutrophils Relative %: 54 % (ref 43–77)
PLATELETS: 335 10*3/uL (ref 150–400)
RBC: 4.37 MIL/uL (ref 3.87–5.11)
RDW: 13.3 % (ref 11.5–15.5)
WBC: 5.2 10*3/uL (ref 4.0–10.5)

## 2013-04-30 LAB — URINALYSIS, ROUTINE W REFLEX MICROSCOPIC
BILIRUBIN URINE: NEGATIVE
Glucose, UA: NEGATIVE mg/dL
Hgb urine dipstick: NEGATIVE
KETONES UR: NEGATIVE mg/dL
Leukocytes, UA: NEGATIVE
NITRITE: NEGATIVE
PROTEIN: NEGATIVE mg/dL
SPECIFIC GRAVITY, URINE: 1.025 (ref 1.005–1.030)
UROBILINOGEN UA: 1 mg/dL (ref 0.0–1.0)
pH: 5 (ref 5.0–8.0)

## 2013-04-30 LAB — COMPREHENSIVE METABOLIC PANEL
ALK PHOS: 65 U/L (ref 39–117)
ALT: 12 U/L (ref 0–35)
AST: 15 U/L (ref 0–37)
Albumin: 3.7 g/dL (ref 3.5–5.2)
BUN: 7 mg/dL (ref 6–23)
CALCIUM: 8.4 mg/dL (ref 8.4–10.5)
CO2: 21 meq/L (ref 19–32)
Chloride: 99 mEq/L (ref 96–112)
Creatinine, Ser: 0.58 mg/dL (ref 0.50–1.10)
GLUCOSE: 84 mg/dL (ref 70–99)
Potassium: 3.6 mEq/L — ABNORMAL LOW (ref 3.7–5.3)
SODIUM: 136 meq/L — AB (ref 137–147)
Total Bilirubin: <0.2 mg/dL — ABNORMAL LOW (ref 0.3–1.2)
Total Protein: 7.9 g/dL (ref 6.0–8.3)

## 2013-04-30 LAB — PRO B NATRIURETIC PEPTIDE: Pro B Natriuretic peptide (BNP): <5 pg/mL (ref 0–125)

## 2013-04-30 LAB — LIPASE, BLOOD: Lipase: 18 U/L (ref 11–59)

## 2013-04-30 LAB — POCT PREGNANCY, URINE: PREG TEST UR: NEGATIVE

## 2013-04-30 MED ORDER — IBUPROFEN 800 MG PO TABS
800.0000 mg | ORAL_TABLET | Freq: Once | ORAL | Status: AC
  Administered 2013-04-30: 800 mg via ORAL
  Filled 2013-04-30: qty 1

## 2013-04-30 MED ORDER — ONDANSETRON 4 MG PO TBDP
4.0000 mg | ORAL_TABLET | Freq: Once | ORAL | Status: AC
  Administered 2013-04-30: 4 mg via ORAL
  Filled 2013-04-30: qty 1

## 2013-04-30 NOTE — Discharge Instructions (Signed)

## 2013-04-30 NOTE — ED Provider Notes (Signed)
CSN: [id number]     Arrival date & time 04/30/13  1143 History   First MD Initiated Contact with Patient 04/30/13 1433     Chief Complaint  Patient presents with  . Cough  . Flu like symptoms    (Consider location/radiation/quality/duration/timing/severity/associated sxs/prior Treatment) HPI Comments: Patient presents with three-day history of nonproductive cough, nausea, vomiting, subjective fevers, rhinorrhea and sore throat. Again Thursday night and progressively got worse. She's had chills at home and checked her temperature. She denies any chest pain, shortness of breath. She has some diffuse abdominal pain and diarrhea. She denies any dysuria hematuria. Denies any vaginal discharge. Denies any sick contacts. She did not get a flu shot. She's a history of asthma and has not been using her medications at home.  The history is provided by the patient.    Past Medical History  Diagnosis Date  . Arthritis   . Asthma   . Bursitis   . GERD (gastroesophageal reflux disease)   . Hypertension    History reviewed. No pertinent past surgical history. Family History  Problem Relation Age of Onset  . Osteoarthritis Mother   . Cancer Mother    History  Substance Use Topics  . Smoking status: Never Smoker   . Smokeless tobacco: Not on file  . Alcohol Use: No   OB History   Grav Para Term Preterm Abortions TAB SAB Ect Mult Living                 Review of Systems  Constitutional: Positive for chills, activity change, appetite change and fatigue. Negative for fever.  HENT: Positive for congestion, rhinorrhea and sore throat.   Respiratory: Positive for cough. Negative for chest tightness and shortness of breath.   Cardiovascular: Negative for chest pain.  Gastrointestinal: Positive for nausea, vomiting, abdominal pain and diarrhea.  Genitourinary: Negative for dysuria, hematuria, vaginal bleeding and vaginal discharge.  Musculoskeletal: Positive for arthralgias and joint swelling.   Neurological: Positive for weakness. Negative for dizziness and headaches.  A complete 10 system review of systems was obtained and all systems are negative except as noted in the HPI and PMH.    Allergies  Penicillins  Home Medications   Current Outpatient Rx  Name  Route  Sig  Dispense  Refill  . albuterol (PROVENTIL HFA;VENTOLIN HFA) 108 (90 BASE) MCG/ACT inhaler   Inhalation   Inhale 2 puffs into the lungs every 6 (six) hours as needed for wheezing.         . cetirizine (ZYRTEC) 10 MG tablet   Oral   Take 10 mg by mouth daily.         . citalopram (CELEXA) 10 MG tablet   Oral   Take 10 mg by mouth daily.         Marland Kitchen desonide (DESOWEN) 0.05 % cream   Topical   Apply 1 application topically 2 (two) times daily.         . diclofenac sodium (VOLTAREN) 1 % GEL   Topical   Apply 2 g topically 4 (four) times daily as needed (apply to leg).         . fluticasone (FLONASE) 50 MCG/ACT nasal spray   Nasal   Place 2 sprays into the nose daily.         Marland Kitchen HYDROcodone-acetaminophen (NORCO/VICODIN) 5-325 MG per tablet   Oral   Take 1 tablet by mouth every 6 (six) hours as needed for pain.   10 tablet   0   .  meloxicam (MOBIC) 15 MG tablet   Oral   Take 1 tablet (15 mg total) by mouth daily.   30 tablet   0   . omeprazole (PRILOSEC) 20 MG capsule   Oral   Take 20 mg by mouth daily.         . phentermine 37.5 MG capsule   Oral   Take 37.5 mg by mouth every morning.         . traZODone (DESYREL) 50 MG tablet   Oral   Take 50 mg by mouth at bedtime.          BP 101/60  Pulse 94  Temp(Src) 98.2 F (36.8 C) (Oral)  Resp 20  SpO2 100%  LMP 03/27/2013 Physical Exam  Constitutional: She is oriented to person, place, and time. She appears well-developed and well-nourished. No distress.  HENT:  Head: Normocephalic and atraumatic.  Right Ear: External ear normal.  Left Ear: External ear normal.  Mouth/Throat: Oropharynx is clear and moist. No  oropharyngeal exudate.  Rhinorrhea and boggy nasal turbinates  Eyes: Conjunctivae and EOM are normal. Pupils are equal, round, and reactive to light.  Neck: Normal range of motion. Neck supple.  No meningismus  Cardiovascular: Normal rate, regular rhythm and normal heart sounds.   No murmur heard. Pulmonary/Chest: Effort normal and breath sounds normal. No respiratory distress. She has no wheezes.  Abdominal: Soft. There is tenderness. There is no rebound and no guarding.  Mild diffuse tenderness, no guarding or rebound  Musculoskeletal: Normal range of motion. She exhibits no edema and no tenderness.  Neurological: She is alert and oriented to person, place, and time. No cranial nerve deficit. She exhibits normal muscle tone. Coordination normal.  Skin: Skin is warm.    ED Course  Procedures (including critical care time) Labs Review Labs Reviewed  COMPREHENSIVE METABOLIC PANEL - Abnormal; Notable for the following:    Sodium 136 (*)    Potassium 3.6 (*)    Total Bilirubin <0.2 (*)    All other components within normal limits  CBC WITH DIFFERENTIAL  PRO B NATRIURETIC PEPTIDE  LIPASE, BLOOD  URINALYSIS, ROUTINE W REFLEX MICROSCOPIC  PREGNANCY, URINE  POCT PREGNANCY, URINE   Imaging Review Dg Chest 2 View  04/30/2013   CLINICAL DATA:  Cough and congestion, history of asthma  EXAM: CHEST  2 VIEW  COMPARISON:  PA and lateral chest x-ray of May 29, 2010  FINDINGS: The lungs are adequately inflated. There is no focal infiltrate. No pulmonary parenchymal mass is demonstrated. The cardiac silhouette is normal in size. The pulmonary vascularity is not engorged. The mediastinum is normal in width. There is no pleural effusion.  IMPRESSION: There is no evidence of pneumonia nor CHF nor other acute cardiopulmonary abnormality. One cannot exclude acute bronchitis in the appropriate clinical setting.   Electronically Signed   By: David  Martinique   On: 04/30/2013 13:41    EKG Interpretation     Date/Time:  Sunday April 30 2013 15:15:36 EST Ventricular Rate:  76 PR Interval:  152 QRS Duration: 88 QT Interval:  403 QTC Calculation: 453 R Axis:   80 Text Interpretation:  Sinus rhythm Borderline T abnormalities, anterior leads No previous ECGs available Confirmed by Wyvonnia Dusky  MD, Mikel Hardgrove ([id number]) on 04/30/2013 3:21:39 PM            MDM   1. Influenza-like illness    3 history of body aches, cough, abdominal pain, vomiting, sore throat and rhinorrhea. Chills at home. She received  a flu shot. Patient appears nontoxic. Vitals are stable. No meningismus. Lungs are clear to auscultation.  Chest x-ray is negative. Patient's lungs are clear. She's able to ambulate and maintain saturations at 100%. She's tolerating by mouth and had no vomiting in ED. Abdomen soft and benign.  Suspect viral syndrome influenza like illness. She is out of the window for Tamiflu to be effective. Supportive care at home with antipyretics and by mouth fluids. Follow up with PCP. Return precautions discussed.   BP 101/60  Pulse 94  Temp(Src) 98.2 F (36.8 C) (Oral)  Resp 20  SpO2 100%  LMP 03/27/2013   Ezequiel Essex, MD 04/30/13 [phone]

## 2013-04-30 NOTE — ED Notes (Signed)
Pt maintained an oxygen saturation of 100% during ambulation. Pt stated she was slightly winded from walking but other than that she had no difficulty.

## 2013-04-30 NOTE — ED Notes (Signed)
Pt reports a cough, abd pain and vomiting for the past 3 days. States she has been unable to keep anything down. Reports a fever at home.

## 2013-08-14 ENCOUNTER — Encounter (HOSPITAL_COMMUNITY): Payer: Self-pay | Admitting: Emergency Medicine

## 2013-08-14 ENCOUNTER — Emergency Department (HOSPITAL_COMMUNITY)
Admission: EM | Admit: 2013-08-14 | Discharge: 2013-08-14 | Disposition: A | Discharge status: Home / Self Care | Payer: Medicaid Other | Attending: Emergency Medicine | Admitting: Emergency Medicine

## 2013-08-14 DIAGNOSIS — L03119 Cellulitis of unspecified part of limb: Secondary | ICD-10-CM | POA: Insufficient documentation

## 2013-08-14 DIAGNOSIS — M129 Arthropathy, unspecified: Secondary | ICD-10-CM | POA: Insufficient documentation

## 2013-08-14 DIAGNOSIS — J45909 Unspecified asthma, uncomplicated: Secondary | ICD-10-CM | POA: Insufficient documentation

## 2013-08-14 DIAGNOSIS — L0291 Cutaneous abscess, unspecified: Secondary | ICD-10-CM

## 2013-08-14 DIAGNOSIS — Z91040 Latex allergy status: Secondary | ICD-10-CM | POA: Insufficient documentation

## 2013-08-14 DIAGNOSIS — K219 Gastro-esophageal reflux disease without esophagitis: Secondary | ICD-10-CM | POA: Insufficient documentation

## 2013-08-14 DIAGNOSIS — I10 Essential (primary) hypertension: Secondary | ICD-10-CM | POA: Insufficient documentation

## 2013-08-14 DIAGNOSIS — Z79899 Other long term (current) drug therapy: Secondary | ICD-10-CM | POA: Insufficient documentation

## 2013-08-14 DIAGNOSIS — IMO0002 Reserved for concepts with insufficient information to code with codable children: Secondary | ICD-10-CM | POA: Insufficient documentation

## 2013-08-14 DIAGNOSIS — L02419 Cutaneous abscess of limb, unspecified: Secondary | ICD-10-CM | POA: Insufficient documentation

## 2013-08-14 DIAGNOSIS — Z791 Long term (current) use of non-steroidal anti-inflammatories (NSAID): Secondary | ICD-10-CM | POA: Insufficient documentation

## 2013-08-14 DIAGNOSIS — Z880 Allergy status to penicillin: Secondary | ICD-10-CM | POA: Insufficient documentation

## 2013-08-14 NOTE — ED Provider Notes (Signed)
Medical screening examination/treatment/procedure(s) were performed by non-physician practitioner and as supervising physician I was immediately available for consultation/collaboration.   EKG Interpretation None        Malvin Johns, MD 08/14/13 2354

## 2013-08-14 NOTE — ED Notes (Signed)
DSD applied

## 2013-08-14 NOTE — Discharge Instructions (Signed)
Abscess Care After An abscess (also called a boil or furuncle) is an infected area that contains a collection of pus. Signs and symptoms of an abscess include pain, tenderness, redness, or hardness, or you may feel a moveable soft area under your skin. An abscess can occur anywhere in the body. The infection may spread to surrounding tissues causing cellulitis. A cut (incision) by the surgeon was made over your abscess and the pus was drained out. Gauze may have been packed into the space to provide a drain that will allow the cavity to heal from the inside outwards. The boil may be painful for 5 to 7 days. Most people with a boil do not have high fevers. Your abscess, if seen early, may not have localized, and may not have been lanced. If not, another appointment may be required for this if it does not get better on its own or with medications. HOME CARE INSTRUCTIONS   Only take over-the-counter or prescription medicines for pain, discomfort, or fever as directed by your caregiver.  When you bathe, soak and then remove gauze or iodoform packs at least daily or as directed by your caregiver. You may then wash the wound gently with mild soapy water. Repack with gauze or do as your caregiver directs. SEEK IMMEDIATE MEDICAL CARE IF:   You develop increased pain, swelling, redness, drainage, or bleeding in the wound site.  You develop signs of generalized infection including muscle aches, chills, fever, or a general ill feeling.  An oral temperature above 102 F (38.9 C) develops, not controlled by medication. See your caregiver for a recheck if you develop any of the symptoms described above. If medications (antibiotics) were prescribed, take them as directed. Document Released: 10/16/2004 Document Revised: 06/22/2011 Document Reviewed: 06/13/2007 [facility] Patient Information 2014 [place]. As discussed.  The wound will drain bloody discharge for the next one to 2 days.  Small piece of packing  has been placed.  Please remove this in 2 days, and allow the skin to heal if you develop worsening pain.  Red streaking, fever, please return for immediate evaluation.  Otherwise, followup with your primary care physician.  Next week

## 2013-08-14 NOTE — ED Notes (Signed)
Pt. reports progressing abscess at right anterior thigh onset 2 weeks ago with swelling / no drainage . Denies fever or chills.

## 2013-08-14 NOTE — ED Notes (Signed)
Pt st's noticed a bump on right upper thigh last week.  St's area has gotton bigger and painful.

## 2013-08-14 NOTE — ED Provider Notes (Signed)
CSN: [id number]     Arrival date & time 08/14/13  1926 History   First MD Initiated Contact with Patient 08/14/13 2315     Chief Complaint  Patient presents with  . Abscess     (Consider location/radiation/quality/duration/timing/severity/associated sxs/prior Treatment) HPI Comments: She noticed an area just adjacent to a mole on the anterior portion of her right thigh.  That has become burning, itching, and painful, with some redness.  That has progressively gotten worse over the past, week, and a half to 2 weeks.  She has a primary care physician, but has not made an appointment with this physician.  She, states she has a history of eczema, so she's been using the Eucerin cream topically without any relief.  Denies any fever  Patient is a 43 y.o. female presenting with abscess. The history is provided by the patient.  Abscess Location:  Leg Leg abscess location:  R upper leg Abscess quality: fluctuance, induration, itching, painful, redness and warmth   Abscess quality: not draining and not weeping   Red streaking: no   Duration:  2 weeks Progression:  Worsening Pain details:    Quality:  Dull, pressure and throbbing   Severity:  Mild   Duration:  2 weeks   Timing:  Constant   Progression:  Worsening Chronicity:  New Context: diabetes   Context: not immunosuppression, not injected drug use, not insect bite/sting and not skin injury   Associated symptoms: no fever     Past Medical History  Diagnosis Date  . Arthritis   . Asthma   . Bursitis   . GERD (gastroesophageal reflux disease)   . Hypertension    History reviewed. No pertinent past surgical history. Family History  Problem Relation Age of Onset  . Osteoarthritis Mother   . Cancer Mother    History  Substance Use Topics  . Smoking status: Never Smoker   . Smokeless tobacco: Not on file  . Alcohol Use: No   OB History   Grav Para Term Preterm Abortions TAB SAB Ect Mult Living                 Review of  Systems  Constitutional: Negative for fever.  Skin: Positive for wound.  All other systems reviewed and are negative.     Allergies  Latex and Penicillins  Home Medications   Prior to Admission medications   Medication Sig Start Date End Date Taking? Authorizing Provider  albuterol (PROVENTIL HFA;VENTOLIN HFA) 108 (90 BASE) MCG/ACT inhaler Inhale 2 puffs into the lungs every 6 (six) hours as needed for wheezing.    Historical Provider, MD  cetirizine (ZYRTEC) 10 MG tablet Take 10 mg by mouth daily.    Historical Provider, MD  citalopram (CELEXA) 10 MG tablet Take 10 mg by mouth daily.    Historical Provider, MD  desonide (DESOWEN) 0.05 % cream Apply 1 application topically 2 (two) times daily.    Historical Provider, MD  diclofenac sodium (VOLTAREN) 1 % GEL Apply 2 g topically 4 (four) times daily as needed (apply to leg).    Historical Provider, MD  fluticasone (FLONASE) 50 MCG/ACT nasal spray Place 2 sprays into the nose daily.    Historical Provider, MD  HYDROcodone-acetaminophen (NORCO/VICODIN) 5-325 MG per tablet Take 1 tablet by mouth every 6 (six) hours as needed for pain. 10/23/12   Hannah Muthersbaugh, PA-C  meloxicam (MOBIC) 15 MG tablet Take 1 tablet (15 mg total) by mouth daily. 10/23/12   Jarrett Soho Muthersbaugh, PA-C  omeprazole (PRILOSEC) 20 MG capsule Take 20 mg by mouth daily.    Historical Provider, MD  phentermine 37.5 MG capsule Take 37.5 mg by mouth every morning.    Historical Provider, MD  traZODone (DESYREL) 50 MG tablet Take 50 mg by mouth at bedtime.    Historical Provider, MD   BP 114/75  Pulse 84  Temp(Src) 98.3 F (36.8 C)  Resp 20  Ht 5\' 9"  (1.753 m)  Wt 245 lb (111.131 kg)  BMI 36.16 kg/m2  SpO2 96%  LMP 07/26/2013 Physical Exam  Nursing note and vitals reviewed. Constitutional: She is oriented to person, place, and time. She appears well-developed and well-nourished.  HENT:  Head: Normocephalic.  Eyes: Pupils are equal, round, and reactive to light.   Neck: Normal range of motion.  Cardiovascular: Normal rate.   Pulmonary/Chest: Effort normal.  Musculoskeletal: Normal range of motion.  Neurological: She is alert and oriented to person, place, and time.  Skin: Skin is warm. There is erythema.       ED Course  INCISION AND DRAINAGE Date/Time: 08/14/2013 11:31 PM Performed by: Garald Balding Authorized by: Garald Balding Consent: Verbal consent obtained. written consent not obtained. Risks and benefits: risks, benefits and alternatives were discussed Consent given by: patient Patient understanding: patient states understanding of the procedure being performed Patient identity confirmed: verbally with patient Type: abscess Body area: lower extremity Location details: right leg Anesthesia: local infiltration Local anesthetic: lidocaine 1% without epinephrine Anesthetic total: 2 ml Patient sedated: no Scalpel size: 11 Needle gauge: 22 Incision type: single straight Complexity: simple Drainage: purulent Drainage amount: moderate Packing material: 1/4 in iodoform gauze Patient tolerance: Patient tolerated the procedure well with no immediate complications.   (including critical care time) Labs Review Labs Reviewed - No data to display  Imaging Review No results found.   EKG Interpretation None      MDM  Small, anterior thigh abscess.  I indeed, with a moderate amount of early drainage 1/4 inch packing placed patient has been instructed to remove this packing in 2 days.  Followup with her primary care physician Final diagnoses:  Abscess         Garald Balding, NP 08/14/13 2333

## 2013-10-13 DIAGNOSIS — Z91040 Latex allergy status: Secondary | ICD-10-CM | POA: Insufficient documentation

## 2013-10-13 DIAGNOSIS — I10 Essential (primary) hypertension: Secondary | ICD-10-CM | POA: Diagnosis not present

## 2013-10-13 DIAGNOSIS — M545 Low back pain, unspecified: Secondary | ICD-10-CM | POA: Insufficient documentation

## 2013-10-13 DIAGNOSIS — G8929 Other chronic pain: Secondary | ICD-10-CM | POA: Insufficient documentation

## 2013-10-13 DIAGNOSIS — Z880 Allergy status to penicillin: Secondary | ICD-10-CM | POA: Insufficient documentation

## 2013-10-13 DIAGNOSIS — Z791 Long term (current) use of non-steroidal anti-inflammatories (NSAID): Secondary | ICD-10-CM | POA: Diagnosis not present

## 2013-10-13 DIAGNOSIS — K219 Gastro-esophageal reflux disease without esophagitis: Secondary | ICD-10-CM | POA: Insufficient documentation

## 2013-10-13 DIAGNOSIS — J45909 Unspecified asthma, uncomplicated: Secondary | ICD-10-CM | POA: Diagnosis not present

## 2013-10-13 DIAGNOSIS — M129 Arthropathy, unspecified: Secondary | ICD-10-CM | POA: Insufficient documentation

## 2013-10-13 DIAGNOSIS — Z3202 Encounter for pregnancy test, result negative: Secondary | ICD-10-CM | POA: Insufficient documentation

## 2013-10-14 ENCOUNTER — Emergency Department (HOSPITAL_COMMUNITY)
Admission: EM | Admit: 2013-10-14 | Discharge: 2013-10-14 | Disposition: A | Discharge status: Home / Self Care | Payer: Medicaid Other | Attending: Emergency Medicine | Admitting: Emergency Medicine

## 2013-10-14 ENCOUNTER — Encounter (HOSPITAL_COMMUNITY): Payer: Self-pay | Admitting: Emergency Medicine

## 2013-10-14 DIAGNOSIS — G8929 Other chronic pain: Secondary | ICD-10-CM

## 2013-10-14 DIAGNOSIS — M549 Dorsalgia, unspecified: Secondary | ICD-10-CM

## 2013-10-14 LAB — COMPREHENSIVE METABOLIC PANEL
ALT: 13 U/L (ref 0–35)
AST: 16 U/L (ref 0–37)
Albumin: 3.9 g/dL (ref 3.5–5.2)
Alkaline Phosphatase: 67 U/L (ref 39–117)
Anion gap: 17 — ABNORMAL HIGH (ref 5–15)
BUN: 8 mg/dL (ref 6–23)
CALCIUM: 9.2 mg/dL (ref 8.4–10.5)
CO2: 22 mEq/L (ref 19–32)
Chloride: 101 mEq/L (ref 96–112)
Creatinine, Ser: 0.58 mg/dL (ref 0.50–1.10)
GLUCOSE: 107 mg/dL — AB (ref 70–99)
Potassium: 3.5 mEq/L — ABNORMAL LOW (ref 3.7–5.3)
Sodium: 140 mEq/L (ref 137–147)
TOTAL PROTEIN: 8 g/dL (ref 6.0–8.3)
Total Bilirubin: 0.3 mg/dL (ref 0.3–1.2)

## 2013-10-14 LAB — CBC WITH DIFFERENTIAL/PLATELET
Basophils Absolute: 0.1 10*3/uL (ref 0.0–0.1)
Basophils Relative: 0 % (ref 0–1)
EOS ABS: 0.1 10*3/uL (ref 0.0–0.7)
EOS PCT: 1 % (ref 0–5)
HEMATOCRIT: 36.5 % (ref 36.0–46.0)
Hemoglobin: 12 g/dL (ref 12.0–15.0)
LYMPHS ABS: 2.7 10*3/uL (ref 0.7–4.0)
LYMPHS PCT: 25 % (ref 12–46)
MCH: 27.8 pg (ref 26.0–34.0)
MCHC: 32.9 g/dL (ref 30.0–36.0)
MCV: 84.7 fL (ref 78.0–100.0)
MONO ABS: 0.5 10*3/uL (ref 0.1–1.0)
Monocytes Relative: 5 % (ref 3–12)
Neutro Abs: 7.8 10*3/uL — ABNORMAL HIGH (ref 1.7–7.7)
Neutrophils Relative %: 69 % (ref 43–77)
Platelets: 421 10*3/uL — ABNORMAL HIGH (ref 150–400)
RBC: 4.31 MIL/uL (ref 3.87–5.11)
RDW: 13.4 % (ref 11.5–15.5)
WBC: 11.2 10*3/uL — AB (ref 4.0–10.5)

## 2013-10-14 LAB — URINALYSIS, ROUTINE W REFLEX MICROSCOPIC
Bilirubin Urine: NEGATIVE
GLUCOSE, UA: NEGATIVE mg/dL
Ketones, ur: 15 mg/dL — AB
Leukocytes, UA: NEGATIVE
Nitrite: NEGATIVE
Protein, ur: NEGATIVE mg/dL
Specific Gravity, Urine: 1.03 (ref 1.005–1.030)
Urobilinogen, UA: 1 mg/dL (ref 0.0–1.0)
pH: 5 (ref 5.0–8.0)

## 2013-10-14 LAB — URINE MICROSCOPIC-ADD ON

## 2013-10-14 LAB — I-STAT TROPONIN, ED: TROPONIN I, POC: 0 ng/mL (ref 0.00–0.08)

## 2013-10-14 LAB — POC URINE PREG, ED: Preg Test, Ur: NEGATIVE

## 2013-10-14 LAB — LIPASE, BLOOD: Lipase: 19 U/L (ref 11–59)

## 2013-10-14 MED ORDER — IBUPROFEN 400 MG PO TABS
600.0000 mg | ORAL_TABLET | Freq: Once | ORAL | Status: AC
  Administered 2013-10-14: 600 mg via ORAL
  Filled 2013-10-14 (×2): qty 1

## 2013-10-14 MED ORDER — OXYCODONE-ACETAMINOPHEN 5-325 MG PO TABS
1.0000 | ORAL_TABLET | Freq: Once | ORAL | Status: AC
  Administered 2013-10-14: 1 via ORAL
  Filled 2013-10-14: qty 1

## 2013-10-14 NOTE — ED Notes (Signed)
Pt stating back pain, chest pain, and nausea and SOB.

## 2013-10-14 NOTE — ED Notes (Signed)
The pt arrived by gems  With lower back pain and lt ankle pain for 2 days. lmp thursday

## 2013-10-14 NOTE — ED Provider Notes (Signed)
MSE was initiated and I personally evaluated the patient and placed orders (if any) at  1:06 AM on October 14, 2013.  Margaret Owens is a 43 year old female with past medical history of arthritis, asthma, hypertension, bursitis presenting to the ED with chest pain, shortness of breath, nausea and abdominal pain. Reported that the chest pain started 2 days ago localized to the Center for chest described as a dull aching sensation with intermittent sharp pain worse with motion. Stated that she's been having intermittent shortness of breath. Stated that she's been using her albuterol inhaler with minimal relief. Stated that she's been feeling weak. Stated that she's been a space in generalized abdominal pain with nausea-denied episodes of emesis. Denied fall, injury, cough, fever, chills, neck pain, neck stiffness, travel.  Patient presents to the ED with multiple complaints. Patient is concerned regarding chest pain. Labs ordered. Patient does not meet fast or criteria. Patient to be moved to main ED for further assessment to be performed.  The patient appears stable so that the remainder of the MSE may be completed by another provider.  Jamse Mead, PA-C 10/14/13 [phone]

## 2013-10-14 NOTE — ED Provider Notes (Signed)
CSN: [id number]     Arrival date & time 10/13/13  2356 History   First MD Initiated Contact with Patient 10/14/13 0024     Chief Complaint  Patient presents with  . Back Pain      HPI Patient presents with worsening low back pain over the past 2 days.  She has a long-standing history of chronic back pain with sciatica.  She is normally managed with narcotic medications by her orthopedic team.  She is currently out.  She reports worsening pain over the past several days.  She's tried anti-inflammatories without improvement in her symptoms.  No fevers or chills.  Denies abdominal discomfort.  She stated to nursing staff earlier that she had chest pain nausea shortness of breath however she denies this at time of my evaluation.  She denies new weakness of her lower extremities.  No fevers or chills.  No recent injury or trauma.  She reports pain in her low back with radiation down her bilateral buttocks consistent with her prior sciatica.  She has also received injections by her orthopedic team in the past.   Past Medical History  Diagnosis Date  . Arthritis   . Asthma   . Bursitis   . GERD (gastroesophageal reflux disease)   . Hypertension    History reviewed. No pertinent past surgical history. Family History  Problem Relation Age of Onset  . Osteoarthritis Mother   . Cancer Mother    History  Substance Use Topics  . Smoking status: Never Smoker   . Smokeless tobacco: Not on file  . Alcohol Use: No   OB History   Grav Para Term Preterm Abortions TAB SAB Ect Mult Living                 Review of Systems  All other systems reviewed and are negative.     Allergies  Latex and Penicillins  Home Medications   Prior to Admission medications   Medication Sig Start Date End Date Taking? Authorizing Provider  albuterol (PROVENTIL HFA;VENTOLIN HFA) 108 (90 BASE) MCG/ACT inhaler Inhale 2 puffs into the lungs every 6 (six) hours as needed for wheezing.   Yes Historical Provider,  MD  budesonide-formoterol (SYMBICORT) 160-4.5 MCG/ACT inhaler Inhale 2 puffs into the lungs 2 (two) times daily.   Yes Historical Provider, MD  cetirizine (ZYRTEC) 10 MG tablet Take 10 mg by mouth daily.   Yes Historical Provider, MD  citalopram (CELEXA) 10 MG tablet Take 10 mg by mouth daily.   Yes Historical Provider, MD  desonide (DESOWEN) 0.05 % cream Apply 1 application topically 2 (two) times daily.   Yes Historical Provider, MD  diclofenac sodium (VOLTAREN) 1 % GEL Apply 2 g topically 4 (four) times daily as needed (apply to leg).   Yes Historical Provider, MD  hydroquinone 4 % cream Apply 1 application topically daily as needed (dark spots).   Yes Historical Provider, MD  meloxicam (MOBIC) 15 MG tablet Take 1 tablet (15 mg total) by mouth daily. 10/23/12  Yes Hannah Muthersbaugh, PA-C  pantoprazole (PROTONIX) 40 MG tablet Take 40 mg by mouth daily.   Yes Historical Provider, MD  phentermine (ADIPEX-P) 37.5 MG tablet Take 37.5 mg by mouth daily before breakfast.   Yes Historical Provider, MD  traZODone (DESYREL) 50 MG tablet Take 50 mg by mouth at bedtime as needed for sleep.    Yes Historical Provider, MD   BP 111/65  Pulse 95  Temp(Src) 97.8 F (36.6 C) (Oral)  Ht  5\' 9"  (1.753 m)  Wt 240 lb (108.863 kg)  BMI 35.43 kg/m2  SpO2 100%  LMP 10/12/2013 Physical Exam  Nursing note and vitals reviewed. Constitutional: She is oriented to person, place, and time. She appears well-developed and well-nourished. No distress.  HENT:  Head: Normocephalic and atraumatic.  Eyes: EOM are normal.  Neck: Normal range of motion.  Cardiovascular: Normal rate, regular rhythm and normal heart sounds.   Pulmonary/Chest: Effort normal and breath sounds normal.  Abdominal: Soft. She exhibits no distension. There is no tenderness.  Musculoskeletal: Normal range of motion.  Mild paralumbar tenderness without spasm.  No thoracic or lumbar point tenderness.  No overlying skin changes.  5 out of 5 strength  in bilateral lower extremity major muscle groups  Neurological: She is alert and oriented to person, place, and time.  Skin: Skin is warm and dry.  Psychiatric: She has a normal mood and affect. Judgment normal.    ED Course  Procedures (including critical care time) Labs Review Labs Reviewed  URINALYSIS, ROUTINE W REFLEX MICROSCOPIC - Abnormal; Notable for the following:    Hgb urine dipstick LARGE (*)    Ketones, ur 15 (*)    All other components within normal limits  CBC WITH DIFFERENTIAL - Abnormal; Notable for the following:    WBC 11.2 (*)    Platelets 421 (*)    Neutro Abs 7.8 (*)    All other components within normal limits  COMPREHENSIVE METABOLIC PANEL - Abnormal; Notable for the following:    Potassium 3.5 (*)    Glucose, Bld 107 (*)    Anion gap 17 (*)    All other components within normal limits  URINE MICROSCOPIC-ADD ON - Abnormal; Notable for the following:    Crystals CA OXALATE CRYSTALS (*)    All other components within normal limits  LIPASE, BLOOD  POC URINE PREG, ED  I-STAT TROPOININ, ED    Imaging Review No results found.  ECG interpretation   Date: 10/14/2013  Rate: 91  Rhythm: normal sinus rhythm  QRS Axis: normal  Intervals: normal  ST/T Wave abnormalities: normal  Conduction Disutrbances: none  Narrative Interpretation:   Old EKG Reviewed: No significant changes noted     MDM   Final diagnoses:  None    This appears to be exacerbation of chronic back pain.  Discharge home with primary care and orthopedic followup.  She understands return to the ER for new or worsening symptoms    Hoy Morn, MD 10/14/13 [phone]

## 2013-10-15 NOTE — ED Provider Notes (Signed)
Medical screening examination/treatment/procedure(s) were performed by non-physician practitioner and as supervising physician I was immediately available for consultation/collaboration.   Leota Jacobsen, MD 10/15/13 [phone]

## 2013-11-04 ENCOUNTER — Emergency Department (HOSPITAL_COMMUNITY)
Admission: EM | Admit: 2013-11-04 | Discharge: 2013-11-04 | Disposition: A | Discharge status: Home / Self Care | Payer: Medicaid Other | Attending: Emergency Medicine | Admitting: Emergency Medicine

## 2013-11-04 ENCOUNTER — Encounter (HOSPITAL_COMMUNITY): Payer: Self-pay | Admitting: Emergency Medicine

## 2013-11-04 DIAGNOSIS — M25579 Pain in unspecified ankle and joints of unspecified foot: Secondary | ICD-10-CM | POA: Diagnosis not present

## 2013-11-04 DIAGNOSIS — Z91040 Latex allergy status: Secondary | ICD-10-CM | POA: Diagnosis not present

## 2013-11-04 DIAGNOSIS — E669 Obesity, unspecified: Secondary | ICD-10-CM | POA: Insufficient documentation

## 2013-11-04 DIAGNOSIS — J45909 Unspecified asthma, uncomplicated: Secondary | ICD-10-CM | POA: Insufficient documentation

## 2013-11-04 DIAGNOSIS — M129 Arthropathy, unspecified: Secondary | ICD-10-CM | POA: Diagnosis not present

## 2013-11-04 DIAGNOSIS — IMO0002 Reserved for concepts with insufficient information to code with codable children: Secondary | ICD-10-CM | POA: Insufficient documentation

## 2013-11-04 DIAGNOSIS — M25571 Pain in right ankle and joints of right foot: Secondary | ICD-10-CM

## 2013-11-04 DIAGNOSIS — K219 Gastro-esophageal reflux disease without esophagitis: Secondary | ICD-10-CM | POA: Insufficient documentation

## 2013-11-04 DIAGNOSIS — Z880 Allergy status to penicillin: Secondary | ICD-10-CM | POA: Diagnosis not present

## 2013-11-04 DIAGNOSIS — Z791 Long term (current) use of non-steroidal anti-inflammatories (NSAID): Secondary | ICD-10-CM | POA: Diagnosis not present

## 2013-11-04 DIAGNOSIS — I10 Essential (primary) hypertension: Secondary | ICD-10-CM | POA: Insufficient documentation

## 2013-11-04 DIAGNOSIS — Z79899 Other long term (current) drug therapy: Secondary | ICD-10-CM | POA: Diagnosis not present

## 2013-11-04 MED ORDER — OXYCODONE-ACETAMINOPHEN 5-325 MG PO TABS: ORAL_TABLET | ORAL | Status: DC

## 2013-11-04 MED ORDER — IBUPROFEN 800 MG PO TABS
800.0000 mg | ORAL_TABLET | Freq: Once | ORAL | Status: AC
  Administered 2013-11-04: 800 mg via ORAL
  Filled 2013-11-04: qty 1

## 2013-11-04 MED ORDER — OXYCODONE-ACETAMINOPHEN 5-325 MG PO TABS
1.0000 | ORAL_TABLET | Freq: Once | ORAL | Status: AC
  Administered 2013-11-04: 1 via ORAL
  Filled 2013-11-04: qty 1

## 2013-11-04 NOTE — Discharge Instructions (Signed)
Rest, Ice intermittently (in the first 24-48 hours), Gentle compression with an Ace wrap, and elevate (Limb above the level of the heart)   Take up to 800mg  of ibuprofen (that is usually 4 over the counter pills)  3 times a day for 5 days. Take with food.  Take percocet for breakthrough pain, do not drink alcohol, drive, care for children or do other critical tasks while taking percocet.  Please follow with your primary care doctor in the next 2 days for a check-up. They must obtain records for further management.   Do not hesitate to return to the Emergency Department for any new, worsening or concerning symptoms.    Ankle Pain Ankle pain is a common symptom. The bones, cartilage, tendons, and muscles of the ankle joint perform a lot of work each day. The ankle joint holds your body weight and allows you to move around. Ankle pain can occur on either side or back of 1 or both ankles. Ankle pain may be sharp and burning or dull and aching. There may be tenderness, stiffness, redness, or warmth around the ankle. The pain occurs more often when a person walks or puts pressure on the ankle. CAUSES  There are many reasons ankle pain can develop. It is important to work with your caregiver to identify the cause since many conditions can impact the bones, cartilage, muscles, and tendons. Causes for ankle pain include:  Injury, including a break (fracture), sprain, or strain often due to a fall, sports, or a high-impact activity.  Swelling (inflammation) of a tendon (tendonitis).  Achilles tendon rupture.  Ankle instability after repeated sprains and strains.  Poor foot alignment.  Pressure on a nerve (tarsal tunnel syndrome).  Arthritis in the ankle or the lining of the ankle.  Crystal formation in the ankle (gout or pseudogout). DIAGNOSIS  A diagnosis is based on your medical history, your symptoms, results of your physical exam, and results of diagnostic tests. Diagnostic tests may  include X-ray exams or a computerized magnetic scan (magnetic resonance imaging, MRI). TREATMENT  Treatment will depend on the cause of your ankle pain and may include:  Keeping pressure off the ankle and limiting activities.  Using crutches or other walking support (a cane or brace).  Using rest, ice, compression, and elevation.  Participating in physical therapy or home exercises.  Wearing shoe inserts or special shoes.  Losing weight.  Taking medications to reduce pain or swelling or receiving an injection.  Undergoing surgery. HOME CARE INSTRUCTIONS   Only take over-the-counter or prescription medicines for pain, discomfort, or fever as directed by your caregiver.  Put ice on the injured area.  Put ice in a plastic bag.  Place a towel between your skin and the bag.  Leave the ice on for 15-20 minutes at a time, 03-04 times a day.  Keep your leg raised (elevated) when possible to lessen swelling.  Avoid activities that cause ankle pain.  Follow specific exercises as directed by your caregiver.  Record how often you have ankle pain, the location of the pain, and what it feels like. This information may be helpful to you and your caregiver.  Ask your caregiver about returning to work or sports and whether you should drive.  Follow up with your caregiver for further examination, therapy, or testing as directed. SEEK MEDICAL CARE IF:   Pain or swelling continues or worsens beyond 1 week.  You have an oral temperature above 102 F (38.9 C).  You are feeling  unwell or have chills.  You are having an increasingly difficult time with walking.  You have loss of sensation or other new symptoms.  You have questions or concerns. MAKE SURE YOU:   Understand these instructions.  Will watch your condition.  Will get help right away if you are not doing well or get worse. Document Released: 09/17/2009 Document Revised: 06/22/2011 Document Reviewed:  09/17/2009 [facility] Patient Information 2015 [name], [name]. This information is not intended to replace advice given to you by your health care provider. Make sure you discuss any questions you have with your health care provider.

## 2013-11-04 NOTE — ED Provider Notes (Signed)
CSN: [id number]     Arrival date & time 11/04/13  1256 History  This chart was scribed for non-physician practitioner, Margaret Blitz, PA-C,working with Margaret Rank, MD, by Margaret Owens, ED Scribe.  This patient was seen in room WTR5/WTR5 and the patient's care was started at 1:32 PM.  Chief Complaint  Patient presents with  . Ankle Pain   The history is provided by the patient. No language interpreter was used.   HPI Comments:  Margaret Owens is a 43 y.o. obese female who presents to the Emergency Department complaining of right ankle pain that started yesterday. Pt states she believes she may have sprained it, but does not recall any specific trauma. She states the pain started as a cramp she describes as dull, sharp, and throbbing and has been worsening since. She rates the pain as 10/10. Pt states bearing weight makes the pain worse. She reports taking Hydrocodone and Meloxicam for the pain with no relief. She states she recently hurt her left knee. She denies numbness or tingling. She denies any trauma, injury, or fall. Her orthopedist is Dr. Alfonso Owens.  Past Medical History  Diagnosis Date  . Arthritis   . Asthma   . Bursitis   . GERD (gastroesophageal reflux disease)   . Hypertension    History reviewed. No pertinent past surgical history. Family History  Problem Relation Age of Onset  . Osteoarthritis Mother   . Cancer Mother    History  Substance Use Topics  . Smoking status: Never Smoker   . Smokeless tobacco: Not on file  . Alcohol Use: No   OB History   Grav Para Term Preterm Abortions TAB SAB Ect Mult Living                 Review of Systems  Musculoskeletal: Positive for arthralgias.  Neurological: Negative for numbness.  All other systems reviewed and are negative.   Allergies  Latex and Penicillins  Home Medications   Prior to Admission medications   Medication Sig Start Date End Date Taking? Authorizing Provider  albuterol (PROVENTIL HFA;VENTOLIN  HFA) 108 (90 BASE) MCG/ACT inhaler Inhale 2 puffs into the lungs every 6 (six) hours as needed for wheezing.   Yes Historical Provider, MD  budesonide-formoterol (SYMBICORT) 160-4.5 MCG/ACT inhaler Inhale 2 puffs into the lungs 2 (two) times daily as needed (shortness of breath.).    Yes Historical Provider, MD  cetirizine (ZYRTEC) 10 MG tablet Take 10 mg by mouth daily.   Yes Historical Provider, MD  citalopram (CELEXA) 10 MG tablet Take 10 mg by mouth daily.   Yes Historical Provider, MD  desonide (DESOWEN) 0.05 % cream Apply 1 application topically 2 (two) times daily.   Yes Historical Provider, MD  diclofenac sodium (VOLTAREN) 1 % GEL Apply 2 g topically 4 (four) times daily as needed (apply to leg).   Yes Historical Provider, MD  hydroquinone 4 % cream Apply 1 application topically daily as needed (dark spots).   Yes Historical Provider, MD  meloxicam (MOBIC) 15 MG tablet Take 1 tablet (15 mg total) by mouth daily. 10/23/12  Yes Margaret Muthersbaugh, PA-C  pantoprazole (PROTONIX) 40 MG tablet Take 40 mg by mouth daily.   Yes Historical Provider, MD  phentermine (ADIPEX-P) 37.5 MG tablet Take 37.5 mg by mouth daily before breakfast.   Yes Historical Provider, MD  traZODone (DESYREL) 50 MG tablet Take 50 mg by mouth at bedtime as needed for sleep.    Yes Historical Provider, MD  oxyCODONE-acetaminophen (  PERCOCET/ROXICET) 5-325 MG per tablet 1 to 2 tabs PO q6hrs  PRN for pain 11/04/13   Margaret Blitz, PA-C   Triage Vitals: BP 132/102  Pulse 92  Temp(Src) 98.4 F (36.9 C) (Oral)  Resp 16  SpO2 100%  LMP 10/12/2013 Physical Exam  Nursing note and vitals reviewed. Constitutional: She is oriented to person, place, and time. She appears well-developed and well-nourished.  Obese  HENT:  Head: Normocephalic and atraumatic.  Eyes: EOM are normal.  Neck: Normal range of motion.  Cardiovascular: Normal rate, regular rhythm and intact distal pulses.   Pulmonary/Chest: Effort normal.  Abdominal:  Soft.  Musculoskeletal: Normal range of motion.  Right ankle with no deformity, trace swelling to lateral malleolus, diffusely tender to palpation along the lateral, medial malleoli addition to dorsum. She is neurovascularly intact. Excellent range of motion to both toes and ankle  Neurological: She is alert and oriented to person, place, and time.  Skin: Skin is warm and dry.  Psychiatric: She has a normal mood and affect. Her behavior is normal.    ED Course  Procedures (including critical care time) DIAGNOSTIC STUDIES: Oxygen Saturation is 100% on RA, normal by my interpretation.   COORDINATION OF CARE: 1:37 PM- Will give Percocet prior to discharge and will give prescription to go home with. Advised pt to d/c Meloxicam and take OTC Ibuprofen 800 mg three times daily. Will provide crutches. Advised to RICE the ankle and follow up with Margaret Owens on Monday. Pt verbalizes understanding and agrees to plan.  Medications  oxyCODONE-acetaminophen (PERCOCET/ROXICET) 5-325 MG per tablet 1 tablet (1 tablet Oral Given 11/04/13 1346)  ibuprofen (ADVIL,MOTRIN) tablet 800 mg (800 mg Oral Given 11/04/13 1346)    Labs Review Labs Reviewed - No data to display  Imaging Review No results found.   EKG Interpretation None      MDM   Final diagnoses:  Arthralgia of right ankle    Filed Vitals:   11/04/13 1311 11/04/13 1405  BP: 132/102 129/89  Pulse: 92   Temp: 98.4 F (36.9 C)   TempSrc: Oral   Resp: 16   SpO2: 100%     Medications  oxyCODONE-acetaminophen (PERCOCET/ROXICET) 5-325 MG per tablet 1 tablet (1 tablet Oral Given 11/04/13 1346)  ibuprofen (ADVIL,MOTRIN) tablet 800 mg (800 mg Oral Given 11/04/13 1346)    Margaret Owens is a 43 y.o. female presenting with atraumatic right full pain. I think this is likely secondary to altered mechanics from pain in the contralateral knee. I will give the patient crutches and pain control. I have asked her to follow with her orthopedist and  discussed physical therapy.  Evaluation does not show pathology that would require ongoing emergent intervention or inpatient treatment. Pt is hemodynamically stable and mentating appropriately. Discussed findings and plan with patient/guardian, who agrees with care plan. All questions answered. Return precautions discussed and outpatient follow up given.   Discharge Medication List as of 11/04/2013  1:52 PM    START taking these medications   Details  oxyCODONE-acetaminophen (PERCOCET/ROXICET) 5-325 MG per tablet 1 to 2 tabs PO q6hrs  PRN for pain, Print           I personally performed the services described in this documentation, which was scribed in my presence. The recorded information has been reviewed and is accurate.    Margaret Blitz, PA-C 11/04/13 1744

## 2013-11-04 NOTE — ED Notes (Signed)
Pt A+Ox4, presents with c/o 10/10 R ankle pain since yesterday, onset "felt like a cramp", pain worsening this AM with ambulation.  Pt denies injuries, reports "degenerative joints".  Pt denies other complaints.  Skin PWD.  MAEI, ambulatory with steady gait.  No obvious deformities or swelling noted.  Speaking full/clear sentences.  NAD.

## 2013-11-04 NOTE — Progress Notes (Signed)
Orthopedic Tech Progress Note Patient Details:  Margaret Owens [birth year] [id number] Fit crutches and taught pt. use of same.  Ortho Devices Type of Ortho Device: Crutches   Darrol Poke 11/04/2013, 1:56 PM

## 2013-11-04 NOTE — Progress Notes (Signed)
Orthopedic Tech Progress Note Patient Details:  Margaret Owens [birth year] [id number] Applied ACE bandage to Rt. ankle and foot.  Pulses, sensation, motion intact before and after bandaging.  Capillary refill less than 2 seconds before and after bandaging. Ortho Devices Type of Ortho Device: Ace wrap Ortho Device/Splint Location: RLE Ortho Device/Splint Interventions: Application   Darrol Poke 11/04/2013, 2:10 PM

## 2013-11-05 NOTE — ED Provider Notes (Signed)
Medical screening examination/treatment/procedure(s) were performed by non-physician practitioner and as supervising physician I was immediately available for consultation/collaboration.   Dorie Rank, MD 11/05/13 [phone]

## 2013-12-16 ENCOUNTER — Emergency Department (HOSPITAL_COMMUNITY)
Admission: EM | Admit: 2013-12-16 | Discharge: 2013-12-16 | Disposition: A | Discharge status: Home / Self Care | Payer: Medicaid Other | Attending: Emergency Medicine | Admitting: Emergency Medicine

## 2013-12-16 ENCOUNTER — Encounter (HOSPITAL_COMMUNITY): Payer: Self-pay | Admitting: Emergency Medicine

## 2013-12-16 DIAGNOSIS — I10 Essential (primary) hypertension: Secondary | ICD-10-CM | POA: Insufficient documentation

## 2013-12-16 DIAGNOSIS — M549 Dorsalgia, unspecified: Secondary | ICD-10-CM

## 2013-12-16 DIAGNOSIS — J45909 Unspecified asthma, uncomplicated: Secondary | ICD-10-CM | POA: Diagnosis not present

## 2013-12-16 DIAGNOSIS — R197 Diarrhea, unspecified: Secondary | ICD-10-CM | POA: Insufficient documentation

## 2013-12-16 DIAGNOSIS — M545 Low back pain, unspecified: Secondary | ICD-10-CM | POA: Insufficient documentation

## 2013-12-16 DIAGNOSIS — Z91040 Latex allergy status: Secondary | ICD-10-CM | POA: Diagnosis not present

## 2013-12-16 DIAGNOSIS — Z880 Allergy status to penicillin: Secondary | ICD-10-CM | POA: Insufficient documentation

## 2013-12-16 DIAGNOSIS — R109 Unspecified abdominal pain: Secondary | ICD-10-CM | POA: Insufficient documentation

## 2013-12-16 DIAGNOSIS — K219 Gastro-esophageal reflux disease without esophagitis: Secondary | ICD-10-CM | POA: Diagnosis not present

## 2013-12-16 DIAGNOSIS — G8929 Other chronic pain: Secondary | ICD-10-CM | POA: Insufficient documentation

## 2013-12-16 DIAGNOSIS — Z79899 Other long term (current) drug therapy: Secondary | ICD-10-CM | POA: Diagnosis not present

## 2013-12-16 DIAGNOSIS — M79609 Pain in unspecified limb: Secondary | ICD-10-CM | POA: Insufficient documentation

## 2013-12-16 DIAGNOSIS — M129 Arthropathy, unspecified: Secondary | ICD-10-CM | POA: Diagnosis not present

## 2013-12-16 DIAGNOSIS — Z791 Long term (current) use of non-steroidal anti-inflammatories (NSAID): Secondary | ICD-10-CM | POA: Diagnosis not present

## 2013-12-16 DIAGNOSIS — IMO0002 Reserved for concepts with insufficient information to code with codable children: Secondary | ICD-10-CM | POA: Insufficient documentation

## 2013-12-16 MED ORDER — HYDROMORPHONE HCL PF 1 MG/ML IJ SOLN
1.0000 mg | Freq: Once | INTRAMUSCULAR | Status: AC
  Administered 2013-12-16: 1 mg via INTRAMUSCULAR
  Filled 2013-12-16: qty 1

## 2013-12-16 MED ORDER — DIAZEPAM 5 MG PO TABS
5.0000 mg | ORAL_TABLET | Freq: Once | ORAL | Status: AC
  Administered 2013-12-16: 5 mg via ORAL
  Filled 2013-12-16: qty 1

## 2013-12-16 MED ORDER — OXYCODONE-ACETAMINOPHEN 5-325 MG PO TABS: 1.0000 | ORAL_TABLET | ORAL | Status: DC | PRN

## 2013-12-16 MED ORDER — DIAZEPAM 5 MG PO TABS: 5.0000 mg | ORAL_TABLET | Freq: Three times a day (TID) | ORAL | Status: DC | PRN

## 2013-12-16 NOTE — ED Provider Notes (Signed)
CSN: [id number]     Arrival date & time 12/16/13  1816 History   None   This chart was scribed for non-physician practitioner Jeannett Senior, PA-C working with Threasa Beards, MD, by Thea Alken, ED Scribe. This patient was seen in room WTR7/WTR7 and the patient's care was started at 6:37 PM. Chief Complaint  Patient presents with  . Leg Pain   The history is provided by the patient. No language interpreter was used.   Margaret Owens is a 43 y.o. female brought in by EMS who presents to the Emergency Department complaining of worsening left leg pain with associated back pain, and  abdominal cramping. Pt reports her left leg gave out PTA but was able to catch herself. She reports upper back pain and worsening lower back pain that shoots down left leg. Pt reports pain worsened within the last 3-4 days. Pt has taken maloxicam and hydrocodone with relief . Pt states she was taking prednisone 2 months ago for similar pain. Pt denies new injury. Pt denies fever and loss of bowels.  Pt has h/o of bulging disc in which she had an MRI last year.    Past Medical History  Diagnosis Date  . Arthritis   . Asthma   . Bursitis   . GERD (gastroesophageal reflux disease)   . Hypertension    History reviewed. No pertinent past surgical history. Family History  Problem Relation Age of Onset  . Osteoarthritis Mother   . Cancer Mother    History  Substance Use Topics  . Smoking status: Never Smoker   . Smokeless tobacco: Not on file  . Alcohol Use: No   OB History   Grav Para Term Preterm Abortions TAB SAB Ect Mult Living                 Review of Systems  Constitutional: Negative for fever and chills.  Gastrointestinal: Positive for abdominal pain and diarrhea.  Musculoskeletal: Positive for back pain and myalgias.   Allergies  Latex and Penicillins  Home Medications   Prior to Admission medications   Medication Sig Start Date End Date Taking? Authorizing Provider  albuterol  (PROVENTIL HFA;VENTOLIN HFA) 108 (90 BASE) MCG/ACT inhaler Inhale 2 puffs into the lungs every 6 (six) hours as needed for wheezing.    Historical Provider, MD  budesonide-formoterol (SYMBICORT) 160-4.5 MCG/ACT inhaler Inhale 2 puffs into the lungs 2 (two) times daily as needed (shortness of breath.).     Historical Provider, MD  cetirizine (ZYRTEC) 10 MG tablet Take 10 mg by mouth daily.    Historical Provider, MD  citalopram (CELEXA) 10 MG tablet Take 10 mg by mouth daily.    Historical Provider, MD  desonide (DESOWEN) 0.05 % cream Apply 1 application topically 2 (two) times daily.    Historical Provider, MD  diclofenac sodium (VOLTAREN) 1 % GEL Apply 2 g topically 4 (four) times daily as needed (apply to leg).    Historical Provider, MD  hydroquinone 4 % cream Apply 1 application topically daily as needed (dark spots).    Historical Provider, MD  meloxicam (MOBIC) 15 MG tablet Take 1 tablet (15 mg total) by mouth daily. 10/23/12   Hannah Muthersbaugh, PA-C  oxyCODONE-acetaminophen (PERCOCET/ROXICET) 5-325 MG per tablet 1 to 2 tabs PO q6hrs  PRN for pain 11/04/13   Elmyra Ricks Pisciotta, PA-C  pantoprazole (PROTONIX) 40 MG tablet Take 40 mg by mouth daily.    Historical Provider, MD  phentermine (ADIPEX-P) 37.5 MG tablet Take 37.5  mg by mouth daily before breakfast.    Historical Provider, MD  traZODone (DESYREL) 50 MG tablet Take 50 mg by mouth at bedtime as needed for sleep.     Historical Provider, MD   BP 133/58  Pulse 67  Temp(Src) 98.7 F (37.1 C) (Oral)  Resp 20  SpO2 100% Physical Exam  Nursing note and vitals reviewed. Constitutional: She is oriented to person, place, and time. She appears well-developed and well-nourished. No distress.  HENT:  Head: Normocephalic and atraumatic.  Eyes: Conjunctivae and EOM are normal.  Neck: Neck supple.  Cardiovascular: Normal rate.   Pulmonary/Chest: Effort normal.  Musculoskeletal: Normal range of motion.  Diffuse tenderness over spinal muscles  over entire back. No midline tenderness over thoracic or lumbar spine. Pain with left straight leg raise. Normal right straight leg raise.  Neurological: She is alert and oriented to person, place, and time.  5/5 and equal lower extremity strength. 2+ and equal patellar reflexes bilaterally. Pt able to dorsiflex bilateral toes and feet with good strength against resistance. Equal sensation bilaterally over thighs and lower legs.   Skin: Skin is warm and dry.  Psychiatric: She has a normal mood and affect. Her behavior is normal.    ED Course  Procedures (including critical care time) DIAGNOSTIC STUDIES: Oxygen Saturation is 100% on RA, normal by my interpretation.    COORDINATION OF CARE: 6:48 PM- Pt advised of plan for treatment including a muscle relaxer and pt agrees.  Labs Review Labs Reviewed - No data to display  Imaging Review No results found.   EKG Interpretation None      MDM   Final diagnoses:  Bilateral back pain, unspecified location    patient is here with chronic back pain, but states pain is worsening, all over her back. I suspect given exam findings, patient has spasms. Will treat with IM Dilaudid, Valium 5 mg by mouth in emergency department. Will reassess. I this time no signs of cauda equina, no fever, do not think she needs any further imaging  Patient's pain improved, states it is down to 8 out of 10 from 10 out of 10. I this time patient is stable for discharge, we'll prescribe Percocet for a few days, advised not to take together with Vicodin. Also will prescribe Valium. Home with followup  Filed Vitals:   12/16/13 1822  BP: 133/58  Pulse: 67  Temp: 98.7 F (37.1 C)  TempSrc: Oral  Resp: 20  SpO2: 100%    I personally performed the services described in this documentation, which was scribed in my presence. The recorded information has been reviewed and is accurate.       Renold Genta, PA-C 12/16/13 1956

## 2013-12-16 NOTE — Discharge Instructions (Signed)
Percocet for severe pain. Valium for spasms. Try heating pads. Rest. Follow up with your doctor.   Back Pain, Adult Low back pain is very common. About 1 in 5 people have back pain.The cause of low back pain is rarely dangerous. The pain often gets better over time.About half of people with a sudden onset of back pain feel better in just 2 weeks. About 8 in 10 people feel better by 6 weeks.  CAUSES Some common causes of back pain include:  Strain of the muscles or ligaments supporting the spine.  Wear and tear (degeneration) of the spinal discs.  Arthritis.  Direct injury to the back. DIAGNOSIS Most of the time, the direct cause of low back pain is not known.However, back pain can be treated effectively even when the exact cause of the pain is unknown.Answering your caregiver's questions about your overall health and symptoms is one of the most accurate ways to make sure the cause of your pain is not dangerous. If your caregiver needs more information, he or she may order lab work or imaging tests (X-rays or MRIs).However, even if imaging tests show changes in your back, this usually does not require surgery. HOME CARE INSTRUCTIONS For many people, back pain returns.Since low back pain is rarely dangerous, it is often a condition that people can learn to [facility] their own.   Remain active. It is stressful on the back to sit or stand in one place. Do not sit, drive, or stand in one place for more than 30 minutes at a time. Take short walks on level surfaces as soon as pain allows.Try to increase the length of time you walk each day.  Do not stay in bed.Resting more than 1 or 2 days can delay your recovery.  Do not avoid exercise or work.Your body is made to move.It is not dangerous to be active, even though your back may hurt.Your back will likely heal faster if you return to being active before your pain is gone.  Pay attention to your body when you bend and lift. Many people  have less discomfortwhen lifting if they bend their knees, keep the load close to their bodies,and avoid twisting. Often, the most comfortable positions are those that put less stress on your recovering back.  Find a comfortable position to sleep. Use a firm mattress and lie on your side with your knees slightly bent. If you lie on your back, put a pillow under your knees.  Only take over-the-counter or prescription medicines as directed by your caregiver. Over-the-counter medicines to reduce pain and inflammation are often the most helpful.Your caregiver may prescribe muscle relaxant drugs.These medicines help dull your pain so you can more quickly return to your normal activities and healthy exercise.  Put ice on the injured area.  Put ice in a plastic bag.  Place a towel between your skin and the bag.  Leave the ice on for 15-20 minutes, 03-04 times a day for the first 2 to 3 days. After that, ice and heat may be alternated to reduce pain and spasms.  Ask your caregiver about trying back exercises and gentle massage. This may be of some benefit.  Avoid feeling anxious or stressed.Stress increases muscle tension and can worsen back pain.It is important to recognize when you are anxious or stressed and learn ways to manage it.Exercise is a great option. SEEK MEDICAL CARE IF:  You have pain that is not relieved with rest or medicine.  You have pain that does  not improve in 1 week.  You have new symptoms.  You are generally not feeling well. SEEK IMMEDIATE MEDICAL CARE IF:   You have pain that radiates from your back into your legs.  You develop new bowel or bladder control problems.  You have unusual weakness or numbness in your arms or legs.  You develop nausea or vomiting.  You develop abdominal pain.  You feel faint. Document Released: 03/30/2005 Document Revised: 09/29/2011 Document Reviewed: 08/01/2013 [facility] Patient Information 2015 [place], [name]. This  information is not intended to replace advice given to you by your health care provider. Make sure you discuss any questions you have with your health care provider.  Back Exercises Back exercises help treat and prevent back injuries. The goal of back exercises is to increase the strength of your abdominal and back muscles and the flexibility of your back. These exercises should be started when you no longer have back pain. Back exercises include:  Pelvic Tilt. Lie on your back with your knees bent. Tilt your pelvis until the lower part of your back is against the floor. Hold this position 5 to 10 sec and repeat 5 to 10 times.  Knee to Chest. Pull first 1 knee up against your chest and hold for 20 to 30 seconds, repeat this with the other knee, and then both knees. This may be done with the other leg straight or bent, whichever feels better.  Sit-Ups or Curl-Ups. Bend your knees 90 degrees. Start with tilting your pelvis, and do a partial, slow sit-up, lifting your trunk only 30 to 45 degrees off the floor. Take at least 2 to 3 seconds for each sit-up. Do not do sit-ups with your knees out straight. If partial sit-ups are difficult, simply do the above but with only tightening your abdominal muscles and holding it as directed.  Hip-Lift. Lie on your back with your knees flexed 90 degrees. Push down with your feet and shoulders as you raise your hips a couple inches off the floor; hold for 10 seconds, repeat 5 to 10 times.  Back arches. Lie on your stomach, propping yourself up on bent elbows. Slowly press on your hands, causing an arch in your low back. Repeat 3 to 5 times. Any initial stiffness and discomfort should lessen with repetition over time.  Shoulder-Lifts. Lie face down with arms beside your body. Keep hips and torso pressed to floor as you slowly lift your head and shoulders off the floor. Do not overdo your exercises, especially in the beginning. Exercises may cause you some mild back  discomfort which lasts for a few minutes; however, if the pain is more severe, or lasts for more than 15 minutes, do not continue exercises until you see your caregiver. Improvement with exercise therapy for back problems is slow.  See your caregivers for assistance with developing a proper back exercise program. Document Released: 05/07/2004 Document Revised: 06/22/2011 Document Reviewed: 01/29/2011 [facility] Patient Information 2015 [place], [place]. This information is not intended to replace advice given to you by your health care provider. Make sure you discuss any questions you have with your health care provider.

## 2013-12-16 NOTE — ED Provider Notes (Signed)
Medical screening examination/treatment/procedure(s) were performed by non-physician practitioner and as supervising physician I was immediately available for consultation/collaboration.   EKG Interpretation None       Threasa Beards, MD 12/16/13 2003

## 2013-12-16 NOTE — ED Notes (Addendum)
Per EMS: Pt reports having bilateral leg pain that starts in the hips and radiates downwards. Pt states her left knee gave out while in the shower, but caught herself before falling causing the left leg pain. Pt states the right leg pain began today. Pt is ambulatory, however reports increased pain. Pt also reports generalized pain.

## 2014-06-22 ENCOUNTER — Ambulatory Visit (HOSPITAL_BASED_OUTPATIENT_CLINIC_OR_DEPARTMENT_OTHER): Payer: Medicaid Other | Attending: Internal Medicine | Admitting: Radiology

## 2014-06-22 VITALS — Ht 69.0 in | Wt 240.0 lb

## 2014-06-22 DIAGNOSIS — G4730 Sleep apnea, unspecified: Secondary | ICD-10-CM | POA: Diagnosis not present

## 2014-06-23 ENCOUNTER — Ambulatory Visit (HOSPITAL_BASED_OUTPATIENT_CLINIC_OR_DEPARTMENT_OTHER): Payer: Medicaid Other | Admitting: Internal Medicine

## 2014-06-23 DIAGNOSIS — G4730 Sleep apnea, unspecified: Secondary | ICD-10-CM

## 2014-06-23 NOTE — Sleep Study (Signed)
   NAME: Margaret Owens DATE OF BIRTH:  05/22/1970 MEDICAL RECORD NUMBER [id number]  LOCATION: [facility]  PHYSICIAN: Blaise Grieshaber D  DATE OF STUDY: 06/22/2014  SLEEP STUDY TYPE: Nocturnal Polysomnogram               REFERRING PHYSICIAN: Nolene Ebbs, MD  INDICATION FOR STUDY: Insomnia with sleep apnea  EPWORTH SLEEPINESS SCORE:   19/24 HEIGHT: $RemoveBefor'5\' 9"'DkLWVaadzXNP$  (175.3 cm)  WEIGHT: 240 lb (108.863 kg)    Body mass index is 35.43 kg/(m^2).  NECK SIZE: 15 in.  MEDICATIONS: Charted for review  SLEEP ARCHITECTURE: Total sleep time 365.5 minutes with sleep efficiency 92.8%. Stage I was 7.8%, stage II 86.9%, stage III absent, REM 5.3% of total sleep time. Sleep latency 7.5 minutes, REM latency 284 minutes, awake after sleep onset 19.5 minutes, arousal index 9.8, bedtime medication: Cetirizine, ibuprofen, tizanidine  RESPIRATORY DATA: Apnea hypopnea index (AHI) 0.0 per hour. No respiratory events met scoring criteria.  OXYGEN DATA: Mild snoring with oxygen desaturation to a nadir of 95% and mean saturation 97.2% on room air  CARDIAC DATA: Sinus rhythm with PVCs  MOVEMENT/PARASOMNIA: No significant movement disturbance, no bathroom trips  IMPRESSION/ RECOMMENDATION:   1) No significant respiratory sleep disturbance. AHI 0.0 per hour. The normal range for adults is an AHI from 0-5 events per hour. Mild snoring with oxygen desaturation to a nadir of 95% and mean saturation 97.2% on room air 2) Sleep architecture reflected bedtime medications as listed above, with preponderance of stage II sleep.   Deneise Lever Diplomate, American Board of Sleep Medicine  ELECTRONICALLY SIGNED ON:  06/23/2014, 11:39 AM [place] PH: [phone]   FX: [phone] [place]

## 2014-06-29 ENCOUNTER — Emergency Department (HOSPITAL_COMMUNITY): Payer: Medicaid Other

## 2014-06-29 ENCOUNTER — Encounter (HOSPITAL_COMMUNITY): Payer: Self-pay | Admitting: Emergency Medicine

## 2014-06-29 ENCOUNTER — Emergency Department (HOSPITAL_COMMUNITY)
Admission: EM | Admit: 2014-06-29 | Discharge: 2014-06-29 | Disposition: A | Discharge status: Home / Self Care | Payer: Medicaid Other | Attending: Emergency Medicine | Admitting: Emergency Medicine

## 2014-06-29 DIAGNOSIS — Z91040 Latex allergy status: Secondary | ICD-10-CM | POA: Diagnosis not present

## 2014-06-29 DIAGNOSIS — Z791 Long term (current) use of non-steroidal anti-inflammatories (NSAID): Secondary | ICD-10-CM | POA: Diagnosis not present

## 2014-06-29 DIAGNOSIS — M1990 Unspecified osteoarthritis, unspecified site: Secondary | ICD-10-CM | POA: Insufficient documentation

## 2014-06-29 DIAGNOSIS — Z79899 Other long term (current) drug therapy: Secondary | ICD-10-CM | POA: Diagnosis not present

## 2014-06-29 DIAGNOSIS — I10 Essential (primary) hypertension: Secondary | ICD-10-CM | POA: Diagnosis not present

## 2014-06-29 DIAGNOSIS — Z7952 Long term (current) use of systemic steroids: Secondary | ICD-10-CM | POA: Insufficient documentation

## 2014-06-29 DIAGNOSIS — M79644 Pain in right finger(s): Secondary | ICD-10-CM | POA: Insufficient documentation

## 2014-06-29 DIAGNOSIS — Z880 Allergy status to penicillin: Secondary | ICD-10-CM | POA: Insufficient documentation

## 2014-06-29 DIAGNOSIS — Z8719 Personal history of other diseases of the digestive system: Secondary | ICD-10-CM | POA: Diagnosis not present

## 2014-06-29 MED ORDER — CEPHALEXIN 500 MG PO CAPS: 500.0000 mg | ORAL_CAPSULE | Freq: Four times a day (QID) | ORAL | Status: DC

## 2014-06-29 MED ORDER — CEPHALEXIN 500 MG PO CAPS
500.0000 mg | ORAL_CAPSULE | Freq: Once | ORAL | Status: AC
  Administered 2014-06-29: 500 mg via ORAL
  Filled 2014-06-29: qty 1

## 2014-06-29 NOTE — ED Provider Notes (Signed)
CSN: [id number]     Arrival date & time 06/29/14  1914 History  This chart was scribed for Charlann Lange, PA-C, working with Noemi Chapel, MD by Starleen Arms, ED Scribe. This patient was seen in room [place] and the patient's care was started at 8:30 PM.   Chief Complaint  Patient presents with  . Hand Pain  . Finger Injury   The history is provided by the patient. No language interpreter was used.   HPI Comments: Margaret Owens is a 44 y.o. female with a history of osteoarthritis who presents to the Emergency Department complaining of right 3rd finger pain described as throbbing onset 2 days ago.  She is unable to remove her ring from the finger of complaint.  Patient has taken 800 mg ibuprofen, meloxicam, and hydrocodone.  Patient denies history of rheumatoid arthritis.  Patient does not recall an injury  Orthopeadist: Dr. Alfonso Ramus Past Medical History  Diagnosis Date  . Arthritis   . Asthma   . Bursitis   . GERD (gastroesophageal reflux disease)   . Hypertension    No past surgical history on file. Family History  Problem Relation Age of Onset  . Osteoarthritis Mother   . Cancer Mother    History  Substance Use Topics  . Smoking status: Never Smoker   . Smokeless tobacco: Not on file  . Alcohol Use: No   OB History    No data available     Review of Systems  Musculoskeletal: Positive for joint swelling and arthralgias.      Allergies  Latex and Penicillins  Home Medications   Prior to Admission medications   Medication Sig Start Date End Date Taking? Authorizing Provider  albuterol (PROVENTIL HFA;VENTOLIN HFA) 108 (90 BASE) MCG/ACT inhaler Inhale 2 puffs into the lungs every 6 (six) hours as needed for wheezing.    Historical Provider, MD  budesonide-formoterol (SYMBICORT) 160-4.5 MCG/ACT inhaler Inhale 2 puffs into the lungs 2 (two) times daily as needed (shortness of breath.).     Historical Provider, MD  cetirizine (ZYRTEC) 10 MG tablet Take 10 mg by mouth  daily.    Historical Provider, MD  citalopram (CELEXA) 10 MG tablet Take 10 mg by mouth daily.    Historical Provider, MD  desonide (DESOWEN) 0.05 % cream Apply 1 application topically 2 (two) times daily.    Historical Provider, MD  diazepam (VALIUM) 5 MG tablet Take 1 tablet (5 mg total) by mouth every 8 (eight) hours as needed for anxiety. 12/16/13   Tatyana Kirichenko, PA-C  diclofenac sodium (VOLTAREN) 1 % GEL Apply 2 g topically 4 (four) times daily as needed (apply to leg).    Historical Provider, MD  hydroquinone 4 % cream Apply 1 application topically daily as needed (dark spots).    Historical Provider, MD  meloxicam (MOBIC) 15 MG tablet Take 1 tablet (15 mg total) by mouth daily. 10/23/12   Hannah Muthersbaugh, PA-C  oxyCODONE-acetaminophen (PERCOCET) 5-325 MG per tablet Take 1 tablet by mouth every 4 (four) hours as needed for severe pain. 12/16/13   Tatyana Kirichenko, PA-C  oxyCODONE-acetaminophen (PERCOCET/ROXICET) 5-325 MG per tablet 1 to 2 tabs PO q6hrs  PRN for pain 11/04/13   Elmyra Ricks Pisciotta, PA-C  pantoprazole (PROTONIX) 40 MG tablet Take 40 mg by mouth daily.    Historical Provider, MD  phentermine (ADIPEX-P) 37.5 MG tablet Take 37.5 mg by mouth daily before breakfast.    Historical Provider, MD  traZODone (DESYREL) 50 MG tablet Take 50 mg by  mouth at bedtime as needed for sleep.     Historical Provider, MD   There were no vitals taken for this visit. Physical Exam  Constitutional: She is oriented to person, place, and time. She appears well-developed and well-nourished. No distress.  HENT:  Head: Normocephalic and atraumatic.  Eyes: Conjunctivae and EOM are normal.  Neck: Neck supple. No tracheal deviation present.  Cardiovascular: Normal rate.   Pulmonary/Chest: Effort normal. No respiratory distress.  Musculoskeletal: Normal range of motion.  Neurological: She is alert and oriented to person, place, and time.  Skin: Skin is warm and dry.  Psychiatric: She has a normal mood  and affect. Her behavior is normal.  Nursing note and vitals reviewed.   ED Course  Procedures (including critical care time)  DIAGNOSTIC STUDIES: Oxygen Saturation is 100% on RA, normal by my interpretation.    COORDINATION OF CARE:  8:36 PM Discussed treatment plan with patient at bedside.  Patient acknowledges and agrees with plan.    Labs Review Labs Reviewed - No data to display  Imaging Review No results found.   EKG Interpretation None      MDM   Final diagnoses:  None     1. Finger pain  No evidence fracture on x-ray. Dr. Sabra Heck in to see patient. Will opt to cover with abx considering tenderness. No focal redness or abscess. No paronychia. No tendon deficit. Minimal redness, but very tender.     I personally performed the services described in this documentation, which was scribed in my presence. The recorded information has been reviewed and is accurate.     Charlann Lange, PA-C 06/29/14 2153  Noemi Chapel, MD 06/30/14 Lurline Hare

## 2014-06-29 NOTE — ED Notes (Signed)
Per EMS- c/o right middle finger pain-believes she "fractured it." Full ROM. No obvious deformities/bruising. VS: 132/70 HR 90 RR 16.

## 2014-06-29 NOTE — ED Notes (Signed)
Patient came in by EMS for right middle finger injury and pain.  Patient describes it as throbbing at at an 8/10.  States she does not remember an exact account of an injury to cause it to be painful.  The finger is slightly enlarged compared to the other middle finger and it has a mild curvature noticed.  The patient believes it could have been from lifting her bag yesterday.  Patient denies taking any pain medication for it; however she did take her arthritis medication which included ibuprofen 800 mg last night before she went to bed.  The right middle finger has limited range of motion. Her hands have good capillary refill <3seconds.

## 2014-06-29 NOTE — Discharge Instructions (Signed)
FOLLOW UP WITH YOUR DOCTOR FOR RECHECK IF NO BETTER IN 2 DAYS.

## 2014-08-23 ENCOUNTER — Emergency Department (HOSPITAL_COMMUNITY)
Admission: EM | Admit: 2014-08-23 | Discharge: 2014-08-23 | Disposition: A | Discharge status: Home / Self Care | Payer: Medicaid Other | Attending: Emergency Medicine | Admitting: Emergency Medicine

## 2014-08-23 ENCOUNTER — Encounter (HOSPITAL_COMMUNITY): Payer: Self-pay | Admitting: *Deleted

## 2014-08-23 DIAGNOSIS — K219 Gastro-esophageal reflux disease without esophagitis: Secondary | ICD-10-CM | POA: Insufficient documentation

## 2014-08-23 DIAGNOSIS — Z91040 Latex allergy status: Secondary | ICD-10-CM | POA: Insufficient documentation

## 2014-08-23 DIAGNOSIS — G8929 Other chronic pain: Secondary | ICD-10-CM | POA: Diagnosis not present

## 2014-08-23 DIAGNOSIS — M1990 Unspecified osteoarthritis, unspecified site: Secondary | ICD-10-CM | POA: Diagnosis not present

## 2014-08-23 DIAGNOSIS — Z880 Allergy status to penicillin: Secondary | ICD-10-CM | POA: Insufficient documentation

## 2014-08-23 DIAGNOSIS — H109 Unspecified conjunctivitis: Secondary | ICD-10-CM | POA: Diagnosis not present

## 2014-08-23 DIAGNOSIS — I10 Essential (primary) hypertension: Secondary | ICD-10-CM | POA: Diagnosis not present

## 2014-08-23 DIAGNOSIS — J45909 Unspecified asthma, uncomplicated: Secondary | ICD-10-CM | POA: Insufficient documentation

## 2014-08-23 DIAGNOSIS — M25551 Pain in right hip: Secondary | ICD-10-CM | POA: Insufficient documentation

## 2014-08-23 DIAGNOSIS — M25552 Pain in left hip: Secondary | ICD-10-CM

## 2014-08-23 DIAGNOSIS — Z791 Long term (current) use of non-steroidal anti-inflammatories (NSAID): Secondary | ICD-10-CM | POA: Insufficient documentation

## 2014-08-23 DIAGNOSIS — Z760 Encounter for issue of repeat prescription: Secondary | ICD-10-CM | POA: Insufficient documentation

## 2014-08-23 DIAGNOSIS — Z7951 Long term (current) use of inhaled steroids: Secondary | ICD-10-CM | POA: Diagnosis not present

## 2014-08-23 DIAGNOSIS — Z79899 Other long term (current) drug therapy: Secondary | ICD-10-CM | POA: Diagnosis not present

## 2014-08-23 HISTORY — DX: Sciatica, unspecified side: M54.30

## 2014-08-23 MED ORDER — ERYTHROMYCIN 5 MG/GM OP OINT: TOPICAL_OINTMENT | OPHTHALMIC | Status: DC

## 2014-08-23 MED ORDER — FLUORESCEIN SODIUM 1 MG OP STRP
1.0000 | ORAL_STRIP | Freq: Once | OPHTHALMIC | Status: AC
  Administered 2014-08-23: 1 via OPHTHALMIC
  Filled 2014-08-23: qty 1

## 2014-08-23 MED ORDER — ERYTHROMYCIN 5 MG/GM OP OINT
1.0000 "application " | TOPICAL_OINTMENT | Freq: Once | OPHTHALMIC | Status: AC
  Administered 2014-08-23: 1 via OPHTHALMIC
  Filled 2014-08-23: qty 3.5

## 2014-08-23 MED ORDER — OXYCODONE-ACETAMINOPHEN 5-325 MG PO TABS
2.0000 | ORAL_TABLET | Freq: Once | ORAL | Status: AC
  Administered 2014-08-23: 2 via ORAL
  Filled 2014-08-23: qty 2

## 2014-08-23 MED ORDER — IBUPROFEN 800 MG PO TABS: 800.0000 mg | ORAL_TABLET | Freq: Three times a day (TID) | ORAL | Status: DC

## 2014-08-23 MED ORDER — IBUPROFEN 800 MG PO TABS
800.0000 mg | ORAL_TABLET | Freq: Once | ORAL | Status: AC
  Administered 2014-08-23: 800 mg via ORAL
  Filled 2014-08-23: qty 1

## 2014-08-23 MED ORDER — TETRACAINE HCL 0.5 % OP SOLN
2.0000 [drp] | Freq: Once | OPHTHALMIC | Status: AC
  Administered 2014-08-23: 2 [drp] via OPHTHALMIC
  Filled 2014-08-23: qty 2

## 2014-08-23 NOTE — Discharge Instructions (Signed)

## 2014-08-23 NOTE — ED Notes (Signed)
MD at bedside. 

## 2014-08-23 NOTE — ED Notes (Signed)
Pt presents via POV c/o waking up this AM with her eye swollen shut, red and itchy.  Also c/o BL hip pain and running out of her ibuprofen and oxycodone.  Pt states she has bursitis in her hips.  Pt a x 4, NAD.

## 2014-08-23 NOTE — ED Provider Notes (Signed)
CSN: [id number]     Arrival date & time 08/23/14  1884 History   First MD Initiated Contact with Patient 08/23/14 [phone]     Chief Complaint  Patient presents with  . Hip Pain  . Medication Refill  . Eye Problem     (Consider location/radiation/quality/duration/timing/severity/associated sxs/prior Treatment) HPI Comments: Out of her medications, is having worsening chronic hip pain.  R eye irritation began a few days ago, patient is having worsening clear discharge today and new redness.  Patient is a 44 y.o. female presenting with hip pain, eye problem, and eye pain. The history is provided by the patient.  Hip Pain This is a chronic problem. The current episode started more than 1 week ago. The problem occurs constantly. The problem has been gradually worsening. Pertinent negatives include no abdominal pain and no shortness of breath. Nothing aggravates the symptoms. Nothing relieves the symptoms.  Eye Problem Associated symptoms: no vomiting   Eye Pain This is a new problem. The current episode started more than 2 days ago. The problem occurs constantly. The problem has been gradually worsening. Pertinent negatives include no abdominal pain and no shortness of breath. Nothing aggravates the symptoms. Nothing relieves the symptoms. She has tried nothing for the symptoms.    Past Medical History  Diagnosis Date  . Arthritis   . Asthma   . Bursitis   . GERD (gastroesophageal reflux disease)   . Hypertension   . Sciatica    Past Surgical History  Procedure Laterality Date  . Colonoscopy     Family History  Problem Relation Age of Onset  . Osteoarthritis Mother   . Cancer Mother    History  Substance Use Topics  . Smoking status: Never Smoker   . Smokeless tobacco: Not on file  . Alcohol Use: No   OB History    No data available     Review of Systems  Constitutional: Negative for fever.  Eyes: Positive for pain.  Respiratory: Negative for cough and shortness of  breath.   Gastrointestinal: Negative for vomiting and abdominal pain.  All other systems reviewed and are negative.     Allergies  Latex and Penicillins  Home Medications   Prior to Admission medications   Medication Sig Start Date End Date Taking? Authorizing Provider  albuterol (PROVENTIL HFA;VENTOLIN HFA) 108 (90 BASE) MCG/ACT inhaler Inhale 2 puffs into the lungs every 6 (six) hours as needed for wheezing (wheezing).     Historical Provider, MD  budesonide-formoterol (SYMBICORT) 160-4.5 MCG/ACT inhaler Inhale 2 puffs into the lungs 2 (two) times daily as needed (shortness of breath.).     Historical Provider, MD  cephALEXin (KEFLEX) 500 MG capsule Take 1 capsule (500 mg total) by mouth 4 (four) times daily. 06/29/14   Charlann Lange, PA-C  cetirizine (ZYRTEC) 10 MG tablet Take 10 mg by mouth daily.    Historical Provider, MD  citalopram (CELEXA) 10 MG tablet Take 10 mg by mouth daily.    Historical Provider, MD  desonide (DESOWEN) 0.05 % cream Apply 1 application topically 2 (two) times daily.    Historical Provider, MD  diazepam (VALIUM) 5 MG tablet Take 1 tablet (5 mg total) by mouth every 8 (eight) hours as needed for anxiety. Patient not taking: Reported on 06/29/2014 12/16/13   Jeannett Senior, PA-C  diclofenac sodium (VOLTAREN) 1 % GEL Apply 2 g topically 4 (four) times daily.     Historical Provider, MD  hydroquinone 4 % cream Apply 1 application topically daily  as needed (dark spots).    Historical Provider, MD  ibuprofen (ADVIL,MOTRIN) 800 MG tablet Take 800 mg by mouth 2 (two) times daily as needed for moderate pain (pain).    Historical Provider, MD  meloxicam (MOBIC) 15 MG tablet Take 1 tablet (15 mg total) by mouth daily. 10/23/12   Hannah Muthersbaugh, PA-C  oxyCODONE-acetaminophen (PERCOCET) 5-325 MG per tablet Take 1 tablet by mouth every 4 (four) hours as needed for severe pain. Patient not taking: Reported on 06/29/2014 12/16/13   Jeannett Senior, PA-C   oxyCODONE-acetaminophen (PERCOCET/ROXICET) 5-325 MG per tablet 1 to 2 tabs PO q6hrs  PRN for pain Patient not taking: Reported on 06/29/2014 11/04/13   Elmyra Ricks Pisciotta, PA-C  pantoprazole (PROTONIX) 40 MG tablet Take 40 mg by mouth daily.    Historical Provider, MD  phentermine (ADIPEX-P) 37.5 MG tablet Take 37.5 mg by mouth daily before breakfast.    Historical Provider, MD  tiZANidine (ZANAFLEX) 4 MG tablet Take 4 mg by mouth at bedtime as needed for muscle spasms (muscle spasms).    Historical Provider, MD  traZODone (DESYREL) 50 MG tablet Take 50 mg by mouth at bedtime as needed for sleep (sleep).     Historical Provider, MD   BP 128/75 mmHg  Pulse 66  Temp(Src) 98.3 F (36.8 C) (Oral)  Resp 16  Ht 5\' 9"  (1.753 m)  Wt 248 lb (112.492 kg)  BMI 36.61 kg/m2  SpO2 100%  LMP 08/08/2014 Physical Exam  Constitutional: She is oriented to person, place, and time. She appears well-developed and well-nourished. No distress.  HENT:  Head: Normocephalic and atraumatic.  Mouth/Throat: Oropharynx is clear and moist.  Eyes: EOM are normal. Pupils are equal, round, and reactive to light. Right conjunctiva is injected (moderate). Right conjunctiva has no hemorrhage. Left conjunctiva is not injected. Left conjunctiva has no hemorrhage.  Neck: Normal range of motion. Neck supple.  Cardiovascular: Normal rate and regular rhythm.  Exam reveals no friction rub.   No murmur heard. Pulmonary/Chest: Effort normal and breath sounds normal. No respiratory distress. She has no wheezes. She has no rales.  Abdominal: Soft. She exhibits no distension. There is no tenderness. There is no rebound.  Musculoskeletal: Normal range of motion. She exhibits no edema.  Neurological: She is alert and oriented to person, place, and time.  Skin: She is not diaphoretic.  Nursing note and vitals reviewed.   ED Course  Procedures (including critical care time) Labs Review Labs Reviewed - No data to display  Imaging  Review No results found.   EKG Interpretation None      MDM   Final diagnoses:  Conjunctivitis, right eye  Hip pain, bilateral    53F here with multiple complaints. R eye irritation: No sick contacts. Began as itching sensation in her R eye, she has rubbed it and it has progressed to a very injected R eye. No vision complaints. No trauma. On exam, R eye severely injected, normal EOMs and pupillary response. Will stain her eyes. Fluorescein stain negative. Given erythromycin.   Bilateral hip pain: Chronic, she has run out of her meds - ibuprofen and percocet. She reports she has hydrocodone at home. Will give PO oxycodone here, however informed her I cannot refill her chronic pain prescription. She is comfortable with this plan. She denies any new trauma.  Stable for discharge.   Evelina Bucy, MD 08/23/14 1051

## 2014-09-27 ENCOUNTER — Encounter (HOSPITAL_COMMUNITY): Payer: Self-pay | Admitting: *Deleted

## 2014-09-27 DIAGNOSIS — Z791 Long term (current) use of non-steroidal anti-inflammatories (NSAID): Secondary | ICD-10-CM | POA: Diagnosis not present

## 2014-09-27 DIAGNOSIS — J45909 Unspecified asthma, uncomplicated: Secondary | ICD-10-CM | POA: Insufficient documentation

## 2014-09-27 DIAGNOSIS — Z91040 Latex allergy status: Secondary | ICD-10-CM | POA: Insufficient documentation

## 2014-09-27 DIAGNOSIS — G8929 Other chronic pain: Secondary | ICD-10-CM | POA: Insufficient documentation

## 2014-09-27 DIAGNOSIS — Z880 Allergy status to penicillin: Secondary | ICD-10-CM | POA: Diagnosis not present

## 2014-09-27 DIAGNOSIS — Z7951 Long term (current) use of inhaled steroids: Secondary | ICD-10-CM | POA: Insufficient documentation

## 2014-09-27 DIAGNOSIS — M545 Low back pain: Secondary | ICD-10-CM | POA: Insufficient documentation

## 2014-09-27 DIAGNOSIS — M1990 Unspecified osteoarthritis, unspecified site: Secondary | ICD-10-CM | POA: Diagnosis not present

## 2014-09-27 DIAGNOSIS — Z79899 Other long term (current) drug therapy: Secondary | ICD-10-CM | POA: Insufficient documentation

## 2014-09-27 DIAGNOSIS — K219 Gastro-esophageal reflux disease without esophagitis: Secondary | ICD-10-CM | POA: Diagnosis not present

## 2014-09-27 DIAGNOSIS — Z792 Long term (current) use of antibiotics: Secondary | ICD-10-CM | POA: Insufficient documentation

## 2014-09-27 DIAGNOSIS — I10 Essential (primary) hypertension: Secondary | ICD-10-CM | POA: Insufficient documentation

## 2014-09-27 NOTE — ED Notes (Signed)
Pt presents with c/o back spasms x 3 days, hx of same. Pt has tried meds and heat with no relief. Ambulatory with assistance.

## 2014-09-28 ENCOUNTER — Emergency Department (HOSPITAL_COMMUNITY)
Admission: EM | Admit: 2014-09-28 | Discharge: 2014-09-28 | Disposition: A | Discharge status: Home / Self Care | Payer: Medicaid Other | Attending: Emergency Medicine | Admitting: Emergency Medicine

## 2014-09-28 DIAGNOSIS — M545 Low back pain, unspecified: Secondary | ICD-10-CM

## 2014-09-28 DIAGNOSIS — G8929 Other chronic pain: Secondary | ICD-10-CM

## 2014-09-28 MED ORDER — PREDNISONE 20 MG PO TABS: 40.0000 mg | ORAL_TABLET | Freq: Every day | ORAL | Status: DC

## 2014-09-28 MED ORDER — KETOROLAC TROMETHAMINE 60 MG/2ML IM SOLN
60.0000 mg | Freq: Once | INTRAMUSCULAR | Status: AC
  Administered 2014-09-28: 60 mg via INTRAMUSCULAR
  Filled 2014-09-28: qty 2

## 2014-09-28 MED ORDER — HYDROMORPHONE HCL 1 MG/ML IJ SOLN
1.0000 mg | Freq: Once | INTRAMUSCULAR | Status: AC
  Administered 2014-09-28: 1 mg via INTRAMUSCULAR
  Filled 2014-09-28: qty 1

## 2014-09-28 NOTE — Discharge Instructions (Signed)
Back Pain, Adult Low back pain is very common. About 1 in 5 people have back pain.The cause of low back pain is rarely dangerous. The pain often gets better over time.About half of people with a sudden onset of back pain feel better in just 2 weeks. About 8 in 10 people feel better by 6 weeks.  CAUSES Some common causes of back pain include:  Strain of the muscles or ligaments supporting the spine.  Wear and tear (degeneration) of the spinal discs.  Arthritis.  Direct injury to the back. DIAGNOSIS Most of the time, the direct cause of low back pain is not known.However, back pain can be treated effectively even when the exact cause of the pain is unknown.Answering your caregiver's questions about your overall health and symptoms is one of the most accurate ways to make sure the cause of your pain is not dangerous. If your caregiver needs more information, he or she may order lab work or imaging tests (X-rays or MRIs).However, even if imaging tests show changes in your back, this usually does not require surgery. HOME CARE INSTRUCTIONS For many people, back pain returns.Since low back pain is rarely dangerous, it is often a condition that people can learn to manageon their own.   Remain active. It is stressful on the back to sit or stand in one place. Do not sit, drive, or stand in one place for more than 30 minutes at a time. Take short walks on level surfaces as soon as pain allows.Try to increase the length of time you walk each day.  Do not stay in bed.Resting more than 1 or 2 days can delay your recovery.  Do not avoid exercise or work.Your body is made to move.It is not dangerous to be active, even though your back may hurt.Your back will likely heal faster if you return to being active before your pain is gone.  Pay attention to your body when you bend and lift. Many people have less discomfortwhen lifting if they bend their knees, keep the load close to their bodies,and  avoid twisting. Often, the most comfortable positions are those that put less stress on your recovering back.  Find a comfortable position to sleep. Use a firm mattress and lie on your side with your knees slightly bent. If you lie on your back, put a pillow under your knees.  Only take over-the-counter or prescription medicines as directed by your caregiver. Over-the-counter medicines to reduce pain and inflammation are often the most helpful.Your caregiver may prescribe muscle relaxant drugs.These medicines help dull your pain so you can more quickly return to your normal activities and healthy exercise.  Put ice on the injured area.  Put ice in a plastic bag.  Place a towel between your skin and the bag.  Leave the ice on for 15-20 minutes, 03-04 times a day for the first 2 to 3 days. After that, ice and heat may be alternated to reduce pain and spasms.  Ask your caregiver about trying back exercises and gentle massage. This may be of some benefit.  Avoid feeling anxious or stressed.Stress increases muscle tension and can worsen back pain.It is important to recognize when you are anxious or stressed and learn ways to manage it.Exercise is a great option. SEEK MEDICAL CARE IF:  You have pain that is not relieved with rest or medicine.  You have pain that does not improve in 1 week.  You have new symptoms.  You are generally not feeling well. SEEK   IMMEDIATE MEDICAL CARE IF:   You have pain that radiates from your back into your legs.  You develop new bowel or bladder control problems.  You have unusual weakness or numbness in your arms or legs.  You develop nausea or vomiting.  You develop abdominal pain.  You feel faint. Document Released: 03/30/2005 Document Revised: 09/29/2011 Document Reviewed: 08/01/2013 [employer] Patient Information 2015 [employer]. This information is not intended to replace advice given to you by your health care provider. Make sure you  discuss any questions you have with your health care provider.  

## 2014-09-28 NOTE — ED Provider Notes (Signed)
CSN: [id number]     Arrival date & time 09/27/14  2224 History   First MD Initiated Contact with Patient 09/28/14 0109     Chief Complaint  Patient presents with  . Back spasms      (Consider location/radiation/quality/duration/timing/severity/associated sxs/prior Treatment) HPI Comments: 44 year old female with a history of arthritis, asthma, esophageal reflux, hypertension, and sciatica presents to the emergency department for further evaluation of back pain. Patient states that she has had worsening back pain over the last 3-5 days which has been shooting to her bilateral feet. She feels pain in her right and left hip as well. She reports similar pain with past exacerbations of her known back pain. Patient is followed by Dr. Alfonso Ramus orthopedics who provides her cortisone shots in her spine. She has not been able to receive a shot in many months. Patient states that she has been taking hydrocodone, ibuprofen, and Zanaflex for symptoms without relief. No associated fever, dysuria, hematuria, extremity numbness/weakness, or bowel/bladder incontinence. No new trauma or injury to patient's low back.  The history is provided by the patient. No language interpreter was used.    Past Medical History  Diagnosis Date  . Arthritis   . Asthma   . Bursitis   . GERD (gastroesophageal reflux disease)   . Hypertension   . Sciatica    Past Surgical History  Procedure Laterality Date  . Colonoscopy     Family History  Problem Relation Age of Onset  . Osteoarthritis Mother   . Cancer Mother    History  Substance Use Topics  . Smoking status: Never Smoker   . Smokeless tobacco: Not on file  . Alcohol Use: No   OB History    No data available      Review of Systems  Musculoskeletal: Positive for myalgias, back pain and arthralgias.  All other systems reviewed and are negative.   Allergies  Latex and Penicillins  Home Medications   Prior to Admission medications   Medication Sig  Start Date End Date Taking? Authorizing Provider  albuterol (PROVENTIL HFA;VENTOLIN HFA) 108 (90 BASE) MCG/ACT inhaler Inhale 2 puffs into the lungs every 6 (six) hours as needed for wheezing (wheezing).    Yes Historical Provider, MD  budesonide-formoterol (SYMBICORT) 160-4.5 MCG/ACT inhaler Inhale 2 puffs into the lungs 2 (two) times daily as needed (shortness of breath.).    Yes Historical Provider, MD  cephALEXin (KEFLEX) 500 MG capsule Take 1 capsule (500 mg total) by mouth 4 (four) times daily. 06/29/14  Yes Shari Upstill, PA-C  cetirizine (ZYRTEC) 10 MG tablet Take 10 mg by mouth daily.   Yes Historical Provider, MD  citalopram (CELEXA) 10 MG tablet Take 10 mg by mouth daily.   Yes Historical Provider, MD  desonide (DESOWEN) 0.05 % cream Apply 1 application topically 2 (two) times daily.   Yes Historical Provider, MD  diclofenac sodium (VOLTAREN) 1 % GEL Apply 2 g topically 4 (four) times daily.    Yes Historical Provider, MD  erythromycin ophthalmic ointment Place a 1/2 inch ribbon of ointment into the lower eyelid twice daily 08/23/14  Yes Evelina Bucy, MD  fluticasone [facility]) 50 MCG/ACT nasal spray Place 2 sprays into both nostrils daily.   Yes Historical Provider, MD  HYDROcodone-acetaminophen (NORCO) 10-325 MG per tablet Take 1 tablet by mouth every 6 (six) hours as needed for moderate pain.   Yes Historical Provider, MD  hydroquinone 4 % cream Apply 1 application topically daily as needed (dark spots).   Yes  Historical Provider, MD  ibuprofen (ADVIL,MOTRIN) 800 MG tablet Take 800 mg by mouth 2 (two) times daily as needed for moderate pain (pain).   Yes Historical Provider, MD  ibuprofen (ADVIL,MOTRIN) 800 MG tablet Take 1 tablet (800 mg total) by mouth 3 (three) times daily. 08/23/14  Yes Evelina Bucy, MD  meloxicam (MOBIC) 15 MG tablet Take 1 tablet (15 mg total) by mouth daily. 10/23/12  Yes Hannah Muthersbaugh, PA-C  pantoprazole (PROTONIX) 40 MG tablet Take 40 mg by mouth daily.   Yes  Historical Provider, MD  phentermine (ADIPEX-P) 37.5 MG tablet Take 37.5 mg by mouth daily before breakfast.   Yes Historical Provider, MD  tiZANidine (ZANAFLEX) 4 MG tablet Take 4 mg by mouth at bedtime as needed for muscle spasms (muscle spasms).   Yes Historical Provider, MD  traZODone (DESYREL) 50 MG tablet Take 50 mg by mouth at bedtime as needed for sleep (sleep).    Yes Historical Provider, MD  Vitamin D, Ergocalciferol, (DRISDOL) 50000 UNITS CAPS capsule Take 50,000 Units by mouth every 7 (seven) days.   Yes Historical Provider, MD  diazepam (VALIUM) 5 MG tablet Take 1 tablet (5 mg total) by mouth every 8 (eight) hours as needed for anxiety. Patient not taking: Reported on 06/29/2014 12/16/13   Jeannett Senior, PA-C  oxyCODONE-acetaminophen (PERCOCET) 5-325 MG per tablet Take 1 tablet by mouth every 4 (four) hours as needed for severe pain. Patient not taking: Reported on 06/29/2014 12/16/13   Jeannett Senior, PA-C  oxyCODONE-acetaminophen (PERCOCET/ROXICET) 5-325 MG per tablet 1 to 2 tabs PO q6hrs  PRN for pain Patient not taking: Reported on 06/29/2014 11/04/13   Elmyra Ricks Pisciotta, PA-C  predniSONE (DELTASONE) 20 MG tablet Take 2 tablets (40 mg total) by mouth daily. 09/28/14   Antonietta Breach, PA-C   BP 131/109 mmHg  Pulse 64  Temp(Src) 98.5 F (36.9 C) (Oral)  Resp 16  SpO2 100%  LMP 09/03/2014 (Approximate)   Physical Exam  Constitutional: She is oriented to person, place, and time. She appears well-developed and well-nourished. No distress.  Nontoxic/nonseptic appearing  HENT:  Head: Normocephalic and atraumatic.  Eyes: Conjunctivae and EOM are normal. No scleral icterus.  Neck: Normal range of motion.  Cardiovascular: Normal rate, regular rhythm and intact distal pulses.   DP and PT pulses 2+ bilaterally  Pulmonary/Chest: Effort normal. No respiratory distress.  Respirations even and unlabored  Musculoskeletal: Normal range of motion. She exhibits tenderness.       Thoracic  back: Normal.       Lumbar back: She exhibits tenderness and pain. She exhibits normal range of motion, no swelling, no deformity and no spasm.       Back:  Tenderness to palpation to right lumbar paraspinal muscles. No bony deformities, step-offs, or crepitus. Positive straight leg raise and crossed straight leg raise.  Neurological: She is alert and oriented to person, place, and time. She exhibits normal muscle tone. Coordination normal.  Sensation to light touch intact in bilateral lower extremities. Patient moving all extremities without ataxia.  Skin: Skin is warm and dry. No rash noted. She is not diaphoretic. No erythema. No pallor.  Psychiatric: She has a normal mood and affect. Her behavior is normal.  Nursing note and vitals reviewed.   ED Course  Procedures (including critical care time) Labs Review Labs Reviewed - No data to display  Imaging Review No results found.   EKG Interpretation None      MDM   Final diagnoses:  Acute exacerbation of  chronic low back pain    Patient with back pain. She has a history of chronic back pain and reports that her pain today feels similar to exacerbation of her chronic back pain. Patient neurovascularly intact in the emergency department. No loss of bowel or bladder control. No concern for cauda equina. No reported fevers, history of cancer, or history of IV drug use. No recent falls.   RICE protocol and pain medicine indicated and discussed with patient. Will discharge with course of prednisone as patient is already on an anti-inflammatory, muscle relaxer, and narcotic pain medication for management of her back pain. Orthopedic follow-up recommended and return precautions given. Patient agreeable to plan with no unaddressed concerns. Patient discharged in good condition.   Filed Vitals:   09/27/14 2226 09/28/14 0352  BP: 135/75 131/109  Pulse: 72 64  Temp: 98.5 F (36.9 C)   TempSrc: Oral   Resp: 20 16  SpO2: 99% 100%      Antonietta Breach, PA-C 09/28/14 1660  Linton Flemings, MD 09/28/14 [phone]

## 2014-09-28 NOTE — ED Notes (Signed)
Pt ambulating independently w/ steady gait on d/c in no acute distress, A&Ox4. D/c instructions reviewed w/ pt - pt denies any further questions or concerns at present. Rx given x1  

## 2014-10-05 ENCOUNTER — Other Ambulatory Visit: Payer: Self-pay | Admitting: Sports Medicine

## 2014-10-05 DIAGNOSIS — M545 Low back pain: Secondary | ICD-10-CM

## 2014-10-14 ENCOUNTER — Ambulatory Visit
Admission: RE | Admit: 2014-10-14 | Discharge: 2014-10-14 | Disposition: A | Discharge status: Home / Self Care | Payer: Medicaid Other | Source: Ambulatory Visit | Attending: Sports Medicine | Admitting: Sports Medicine

## 2014-10-14 DIAGNOSIS — M545 Low back pain: Secondary | ICD-10-CM

## 2015-01-02 ENCOUNTER — Emergency Department (HOSPITAL_COMMUNITY)
Admission: EM | Admit: 2015-01-02 | Discharge: 2015-01-02 | Disposition: A | Discharge status: Home / Self Care | Payer: Medicaid Other | Attending: Emergency Medicine | Admitting: Emergency Medicine

## 2015-01-02 ENCOUNTER — Emergency Department (HOSPITAL_COMMUNITY): Payer: Medicaid Other

## 2015-01-02 ENCOUNTER — Encounter (HOSPITAL_COMMUNITY): Payer: Self-pay | Admitting: *Deleted

## 2015-01-02 DIAGNOSIS — Z7951 Long term (current) use of inhaled steroids: Secondary | ICD-10-CM | POA: Diagnosis not present

## 2015-01-02 DIAGNOSIS — R2231 Localized swelling, mass and lump, right upper limb: Secondary | ICD-10-CM | POA: Insufficient documentation

## 2015-01-02 DIAGNOSIS — Z7952 Long term (current) use of systemic steroids: Secondary | ICD-10-CM | POA: Insufficient documentation

## 2015-01-02 DIAGNOSIS — R202 Paresthesia of skin: Secondary | ICD-10-CM | POA: Diagnosis not present

## 2015-01-02 DIAGNOSIS — Z91040 Latex allergy status: Secondary | ICD-10-CM | POA: Diagnosis not present

## 2015-01-02 DIAGNOSIS — I10 Essential (primary) hypertension: Secondary | ICD-10-CM | POA: Insufficient documentation

## 2015-01-02 DIAGNOSIS — M79644 Pain in right finger(s): Secondary | ICD-10-CM | POA: Diagnosis not present

## 2015-01-02 DIAGNOSIS — J45909 Unspecified asthma, uncomplicated: Secondary | ICD-10-CM | POA: Insufficient documentation

## 2015-01-02 DIAGNOSIS — Z792 Long term (current) use of antibiotics: Secondary | ICD-10-CM | POA: Diagnosis not present

## 2015-01-02 DIAGNOSIS — K219 Gastro-esophageal reflux disease without esophagitis: Secondary | ICD-10-CM | POA: Diagnosis not present

## 2015-01-02 DIAGNOSIS — M1990 Unspecified osteoarthritis, unspecified site: Secondary | ICD-10-CM | POA: Insufficient documentation

## 2015-01-02 DIAGNOSIS — Z791 Long term (current) use of non-steroidal anti-inflammatories (NSAID): Secondary | ICD-10-CM | POA: Diagnosis not present

## 2015-01-02 DIAGNOSIS — Z880 Allergy status to penicillin: Secondary | ICD-10-CM | POA: Diagnosis not present

## 2015-01-02 DIAGNOSIS — Z79899 Other long term (current) drug therapy: Secondary | ICD-10-CM | POA: Insufficient documentation

## 2015-01-02 NOTE — ED Provider Notes (Signed)
CSN: [id number]     Arrival date & time 01/02/15  1244 History  This chart was scribed for non-physician practitioner, Okey Regal, PA-C working with Deno Etienne, DO by Tula Nakayama, ED scribe. This patient was seen in room TR08C/TR08C and the patient's care was started at 2:33 PM    Chief Complaint  Patient presents with  . Finger Injury   The history is provided by the patient. No language interpreter was used.    HPI Comments:  Margaret Owens is a 44 y.o. female who presents to the Emergency Department complaining of constant, moderate, throbbing right 2nd finger pain that started this morning. She states intermittent tingling as an associated symptom. Pt notes intermittent coldness of her fingers, but denies any trigger for her symptoms. She denies smoking or any recent injuries or falls. Pt also denies numbness. Patient has not tried any over-the-counter therapies. No insect bites, no signs of infection.    Past Medical History  Diagnosis Date  . Arthritis   . Asthma   . Bursitis   . GERD (gastroesophageal reflux disease)   . Hypertension   . Sciatica    Past Surgical History  Procedure Laterality Date  . Colonoscopy     Family History  Problem Relation Age of Onset  . Osteoarthritis Mother   . Cancer Mother    Social History  Substance Use Topics  . Smoking status: Never Smoker   . Smokeless tobacco: None  . Alcohol Use: No   OB History    No data available     Review of Systems  All other systems reviewed and are negative.   Allergies  Latex and Penicillins  Home Medications   Prior to Admission medications   Medication Sig Start Date End Date Taking? Authorizing Provider  albuterol (PROVENTIL HFA;VENTOLIN HFA) 108 (90 BASE) MCG/ACT inhaler Inhale 2 puffs into the lungs every 6 (six) hours as needed for wheezing (wheezing).     Historical Provider, MD  budesonide-formoterol (SYMBICORT) 160-4.5 MCG/ACT inhaler Inhale 2 puffs into the lungs 2 (two)  times daily as needed (shortness of breath.).     Historical Provider, MD  cephALEXin (KEFLEX) 500 MG capsule Take 1 capsule (500 mg total) by mouth 4 (four) times daily. 06/29/14   Charlann Lange, PA-C  cetirizine (ZYRTEC) 10 MG tablet Take 10 mg by mouth daily.    Historical Provider, MD  citalopram (CELEXA) 10 MG tablet Take 10 mg by mouth daily.    Historical Provider, MD  desonide (DESOWEN) 0.05 % cream Apply 1 application topically 2 (two) times daily.    Historical Provider, MD  diazepam (VALIUM) 5 MG tablet Take 1 tablet (5 mg total) by mouth every 8 (eight) hours as needed for anxiety. Patient not taking: Reported on 06/29/2014 12/16/13   Jeannett Senior, PA-C  diclofenac sodium (VOLTAREN) 1 % GEL Apply 2 g topically 4 (four) times daily.     Historical Provider, MD  erythromycin ophthalmic ointment Place a 1/2 inch ribbon of ointment into the lower eyelid twice daily 08/23/14   Evelina Bucy, MD  fluticasone [facility]) 50 MCG/ACT nasal spray Place 2 sprays into both nostrils daily.    Historical Provider, MD  HYDROcodone-acetaminophen (NORCO) 10-325 MG per tablet Take 1 tablet by mouth every 6 (six) hours as needed for moderate pain.    Historical Provider, MD  hydroquinone 4 % cream Apply 1 application topically daily as needed (dark spots).    Historical Provider, MD  ibuprofen (ADVIL,MOTRIN) 800 MG tablet  Take 800 mg by mouth 2 (two) times daily as needed for moderate pain (pain).    Historical Provider, MD  ibuprofen (ADVIL,MOTRIN) 800 MG tablet Take 1 tablet (800 mg total) by mouth 3 (three) times daily. 08/23/14   Evelina Bucy, MD  meloxicam (MOBIC) 15 MG tablet Take 1 tablet (15 mg total) by mouth daily. 10/23/12   Hannah Muthersbaugh, PA-C  oxyCODONE-acetaminophen (PERCOCET) 5-325 MG per tablet Take 1 tablet by mouth every 4 (four) hours as needed for severe pain. Patient not taking: Reported on 06/29/2014 12/16/13   Jeannett Senior, PA-C  oxyCODONE-acetaminophen (PERCOCET/ROXICET) 5-325  MG per tablet 1 to 2 tabs PO q6hrs  PRN for pain Patient not taking: Reported on 06/29/2014 11/04/13   Elmyra Ricks Pisciotta, PA-C  pantoprazole (PROTONIX) 40 MG tablet Take 40 mg by mouth daily.    Historical Provider, MD  phentermine (ADIPEX-P) 37.5 MG tablet Take 37.5 mg by mouth daily before breakfast.    Historical Provider, MD  predniSONE (DELTASONE) 20 MG tablet Take 2 tablets (40 mg total) by mouth daily. 09/28/14   Antonietta Breach, PA-C  tiZANidine (ZANAFLEX) 4 MG tablet Take 4 mg by mouth at bedtime as needed for muscle spasms (muscle spasms).    Historical Provider, MD  traZODone (DESYREL) 50 MG tablet Take 50 mg by mouth at bedtime as needed for sleep (sleep).     Historical Provider, MD  Vitamin D, Ergocalciferol, (DRISDOL) 50000 UNITS CAPS capsule Take 50,000 Units by mouth every 7 (seven) days.    Historical Provider, MD   BP 135/79 mmHg  Pulse 85  Temp(Src) 97.9 F (36.6 C) (Oral)  Resp 18  Ht 5\' 9"  (1.753 m)  Wt 243 lb (110.224 kg)  BMI 35.87 kg/m2  SpO2 100%  LMP 12/27/2014   Physical Exam  Constitutional: She appears well-developed and well-nourished. No distress.  HENT:  Head: Normocephalic and atraumatic.  Eyes: Conjunctivae and EOM are normal.  Neck: Neck supple. No tracheal deviation present.  Cardiovascular: Normal rate.   Pulmonary/Chest: Effort normal. No respiratory distress.  Musculoskeletal: Normal range of motion. She exhibits tenderness.  Very minimal swelling of the right second digit, only slightly larger compared to the left No redness, warmth or signs of infection Sensation intact Cap refill less than 3 s Pain with palpation of entire finger Intact ROM  Skin: Skin is warm and dry.  Psychiatric: She has a normal mood and affect. Her behavior is normal.  Nursing note and vitals reviewed.   ED Course  Procedures   DIAGNOSTIC STUDIES: Oxygen Saturation is 100% on RA, normal by my interpretation.    COORDINATION OF CARE: 2:39 PM Discussed treatment  plan with pt. Pt agreed to plan.  Imaging Review Dg Finger Index Right  01/02/2015   CLINICAL DATA:  Right second digit pain and swelling, no trauma  EXAM: RIGHT INDEX FINGER 2+V  COMPARISON:  None.  FINDINGS: There is no evidence of fracture or dislocation. There is no evidence of arthropathy or other focal bone abnormality. Soft tissues are unremarkable.  IMPRESSION: Negative.   Electronically Signed   By: Conchita Paris M.D.   On: 01/02/2015 14:21   I have personally reviewed and evaluated these images as part of my medical decision-making.  MDM   Final diagnoses:  Finger pain, right   Labs:    Imaging: x-ray right index finger- negative  Consults:   Therapeutics:   Discharge Meds:   Assessment/Plan: Discussed with pt no signs of infection and x-ray negative for fracture.  Pt to use OTC Ibuprofen and Tylenol for pain, as needed. Apply ice and warm compresses. Pt agreed to plan and has no further questions at this time.  I personally performed the services described in this documentation, which was scribed in my presence. The recorded information has been reviewed and is accurate.    Okey Regal, PA-C 01/03/15 [place], DO 01/03/15 2339

## 2015-01-02 NOTE — ED Notes (Signed)
Pt reports rt index finger pain/swelling that extends up arm. Pt unsure of any injury.

## 2015-01-02 NOTE — Discharge Instructions (Signed)
Please monitor for new or worsening signs or symptoms, if any present please return for further evaluation and management.

## 2015-01-05 ENCOUNTER — Emergency Department (HOSPITAL_COMMUNITY)
Admission: EM | Admit: 2015-01-05 | Discharge: 2015-01-06 | Disposition: A | Discharge status: Home / Self Care | Payer: Medicaid Other | Attending: Emergency Medicine | Admitting: Emergency Medicine

## 2015-01-05 DIAGNOSIS — Z3202 Encounter for pregnancy test, result negative: Secondary | ICD-10-CM | POA: Insufficient documentation

## 2015-01-05 DIAGNOSIS — Z91040 Latex allergy status: Secondary | ICD-10-CM | POA: Insufficient documentation

## 2015-01-05 DIAGNOSIS — K219 Gastro-esophageal reflux disease without esophagitis: Secondary | ICD-10-CM | POA: Insufficient documentation

## 2015-01-05 DIAGNOSIS — Z7951 Long term (current) use of inhaled steroids: Secondary | ICD-10-CM | POA: Insufficient documentation

## 2015-01-05 DIAGNOSIS — N939 Abnormal uterine and vaginal bleeding, unspecified: Secondary | ICD-10-CM | POA: Diagnosis present

## 2015-01-05 DIAGNOSIS — Z880 Allergy status to penicillin: Secondary | ICD-10-CM | POA: Diagnosis not present

## 2015-01-05 DIAGNOSIS — M549 Dorsalgia, unspecified: Secondary | ICD-10-CM | POA: Insufficient documentation

## 2015-01-05 DIAGNOSIS — Z79899 Other long term (current) drug therapy: Secondary | ICD-10-CM | POA: Diagnosis not present

## 2015-01-05 DIAGNOSIS — J45909 Unspecified asthma, uncomplicated: Secondary | ICD-10-CM | POA: Diagnosis not present

## 2015-01-05 DIAGNOSIS — A5901 Trichomonal vulvovaginitis: Secondary | ICD-10-CM | POA: Diagnosis not present

## 2015-01-05 DIAGNOSIS — A599 Trichomoniasis, unspecified: Secondary | ICD-10-CM

## 2015-01-05 DIAGNOSIS — M1990 Unspecified osteoarthritis, unspecified site: Secondary | ICD-10-CM | POA: Diagnosis not present

## 2015-01-05 DIAGNOSIS — R42 Dizziness and giddiness: Secondary | ICD-10-CM | POA: Diagnosis not present

## 2015-01-05 DIAGNOSIS — R197 Diarrhea, unspecified: Secondary | ICD-10-CM | POA: Insufficient documentation

## 2015-01-05 DIAGNOSIS — I10 Essential (primary) hypertension: Secondary | ICD-10-CM | POA: Insufficient documentation

## 2015-01-05 DIAGNOSIS — Z792 Long term (current) use of antibiotics: Secondary | ICD-10-CM | POA: Insufficient documentation

## 2015-01-05 DIAGNOSIS — N938 Other specified abnormal uterine and vaginal bleeding: Secondary | ICD-10-CM

## 2015-01-05 NOTE — ED Notes (Signed)
Pt called to be triage with no response.  RN notified.  Will call pt again.

## 2015-01-06 ENCOUNTER — Encounter (HOSPITAL_COMMUNITY): Payer: Self-pay | Admitting: Emergency Medicine

## 2015-01-06 LAB — CBC WITH DIFFERENTIAL/PLATELET
Basophils Absolute: 0 10*3/uL (ref 0.0–0.1)
Basophils Relative: 0 %
EOS PCT: 0 %
Eosinophils Absolute: 0 10*3/uL (ref 0.0–0.7)
HEMATOCRIT: 39.5 % (ref 36.0–46.0)
Hemoglobin: 13.2 g/dL (ref 12.0–15.0)
Lymphocytes Relative: 30 %
Lymphs Abs: 2.7 10*3/uL (ref 0.7–4.0)
MCH: 29 pg (ref 26.0–34.0)
MCHC: 33.4 g/dL (ref 30.0–36.0)
MCV: 86.8 fL (ref 78.0–100.0)
MONOS PCT: 7 %
Monocytes Absolute: 0.7 10*3/uL (ref 0.1–1.0)
NEUTROS ABS: 5.8 10*3/uL (ref 1.7–7.7)
Neutrophils Relative %: 63 %
PLATELETS: 409 10*3/uL — AB (ref 150–400)
RBC: 4.55 MIL/uL (ref 3.87–5.11)
RDW: 14.1 % (ref 11.5–15.5)
WBC: 9.3 10*3/uL (ref 4.0–10.5)

## 2015-01-06 LAB — URINALYSIS, ROUTINE W REFLEX MICROSCOPIC
Glucose, UA: NEGATIVE mg/dL
KETONES UR: NEGATIVE mg/dL
Leukocytes, UA: NEGATIVE
NITRITE: NEGATIVE
Protein, ur: NEGATIVE mg/dL
Specific Gravity, Urine: 1.034 — ABNORMAL HIGH (ref 1.005–1.030)
UROBILINOGEN UA: 1 mg/dL (ref 0.0–1.0)
pH: 5 (ref 5.0–8.0)

## 2015-01-06 LAB — RPR: RPR: NONREACTIVE

## 2015-01-06 LAB — URINE MICROSCOPIC-ADD ON

## 2015-01-06 LAB — BASIC METABOLIC PANEL
Anion gap: 12 (ref 5–15)
BUN: 11 mg/dL (ref 6–20)
CALCIUM: 9.1 mg/dL (ref 8.9–10.3)
CO2: 23 mmol/L (ref 22–32)
CREATININE: 0.65 mg/dL (ref 0.44–1.00)
Chloride: 106 mmol/L (ref 101–111)
GFR calc Af Amer: >60 mL/min (ref >60)
GLUCOSE: 103 mg/dL — AB (ref 65–99)
Potassium: 3.7 mmol/L (ref 3.5–5.1)
Sodium: 141 mmol/L (ref 135–145)

## 2015-01-06 LAB — WET PREP, GENITAL
Clue Cells Wet Prep HPF POC: NONE SEEN
Yeast Wet Prep HPF POC: NONE SEEN

## 2015-01-06 LAB — I-STAT BETA HCG BLOOD, ED (MC, WL, AP ONLY): I-stat hCG, quantitative: <5 m[IU]/mL (ref <5)

## 2015-01-06 LAB — HIV ANTIBODY (ROUTINE TESTING W REFLEX): HIV Screen 4th Generation wRfx: NONREACTIVE

## 2015-01-06 MED ORDER — NAPROXEN 500 MG PO TABS: ORAL_TABLET | ORAL | Status: DC

## 2015-01-06 MED ORDER — KETOROLAC TROMETHAMINE 30 MG/ML IJ SOLN
30.0000 mg | Freq: Once | INTRAMUSCULAR | Status: AC
  Administered 2015-01-06: 30 mg via INTRAVENOUS
  Filled 2015-01-06: qty 1

## 2015-01-06 MED ORDER — METRONIDAZOLE 500 MG PO TABS: 500.0000 mg | ORAL_TABLET | Freq: Two times a day (BID) | ORAL | Status: DC

## 2015-01-06 NOTE — ED Provider Notes (Signed)
CSN: [id number]     Arrival date & time 01/05/15  2308 History  This chart was scribed for Margaret Porter, MD by Hansel Feinstein, ED Scribe. This patient was seen in room WA17/WA17 and the patient's care was started at 1:20 AM.     Chief Complaint  Patient presents with  . Vaginal Bleeding  . Abdominal Pain   The history is provided by the patient. No language interpreter was used.    HPI Comments: Margaret Owens is a 44 y.o. female who presents to the Emergency Department complaining of moderate vaginal bleeding, described as an extended menstrual cycle, starting on September 14th with associated abdominal pain, lower back pain, one episode of sharp right flank pain, a feeling in her throat like she wants throw up, diarrhea (2x the day before yesterday), frequency of urination onset 2 weeks ago, intermittent lightheadedness and dizziness. She notes that her normal periods last 5 days and her current cycle has intermittent heaviness increased from normal. She also notes that she had 2 irregular cycles in August. LNMP in July. She has taken Tylenol with moderate relief of abdominal pain. No Hx of similar irregularity in her menstrual cycles. She has not been seen by her PCP for her current Sx. Pt is not on oral contraceptives, is not currently sexually active. No hx of tubal ligation. Hx of uterine fibroids, cysts. Hx of anemia when pregnant controlled by Vitamin B-2. She denies vomiting, nausea, dysuria.   G: 3 P: 3 A: 0     PCP is Dr. Jeanie Cooks No current OB/GYN; prior Ob was Dr. Ruthann Cancer.   Past Medical History  Diagnosis Date  . Arthritis   . Asthma   . Bursitis   . GERD (gastroesophageal reflux disease)   . Hypertension   . Sciatica    Past Surgical History  Procedure Laterality Date  . Colonoscopy     Family History  Problem Relation Age of Onset  . Osteoarthritis Mother   . Cancer Mother    Social History  Substance Use Topics  . Smoking status: Never Smoker   . Smokeless tobacco:  None  . Alcohol Use: No     OB History    No data available     Review of Systems  Gastrointestinal: Positive for diarrhea. Negative for nausea and vomiting.  Genitourinary: Positive for frequency, flank pain and vaginal bleeding. Negative for dysuria.  Musculoskeletal: Positive for back pain.  Neurological: Positive for dizziness and light-headedness.  All other systems reviewed and are negative.  Allergies  Latex and Penicillins  Home Medications   Prior to Admission medications   Medication Sig Start Date End Date Taking? Authorizing Provider  albuterol (PROVENTIL HFA;VENTOLIN HFA) 108 (90 BASE) MCG/ACT inhaler Inhale 2 puffs into the lungs every 6 (six) hours as needed for wheezing (wheezing).    Yes Historical Provider, MD  budesonide-formoterol (SYMBICORT) 160-4.5 MCG/ACT inhaler Inhale 2 puffs into the lungs 2 (two) times daily as needed (shortness of breath.).    Yes Historical Provider, MD  cetirizine (ZYRTEC) 10 MG tablet Take 10 mg by mouth daily.   Yes Historical Provider, MD  citalopram (CELEXA) 10 MG tablet Take 10 mg by mouth daily.   Yes Historical Provider, MD  desonide (DESOWEN) 0.05 % cream Apply 1 application topically 2 (two) times daily.   Yes Historical Provider, MD  diclofenac sodium (VOLTAREN) 1 % GEL Apply 2 g topically 4 (four) times daily as needed (apply to leg).    Yes Historical Provider,  MD  fluticasone (FLONASE) 50 MCG/ACT nasal spray Place 2 sprays into both nostrils daily.   Yes Historical Provider, MD  hydroquinone 4 % cream Apply 1 application topically daily as needed (dark spots).   Yes Historical Provider, MD  oxyCODONE-acetaminophen (PERCOCET/ROXICET) 5-325 MG per tablet 1 to 2 tabs PO q6hrs  PRN for pain Patient taking differently: Take 1 tablet by mouth every 8 (eight) hours as needed for severe pain. 1 to 2 tabs PO q6hrs  PRN for pain 11/04/13  Yes Nicole Pisciotta, PA-C  pantoprazole (PROTONIX) 40 MG tablet Take 40 mg by mouth daily.   Yes  Historical Provider, MD  phentermine (ADIPEX-P) 37.5 MG tablet Take 37.5 mg by mouth daily before breakfast.   Yes Historical Provider, MD  tiZANidine (ZANAFLEX) 4 MG tablet Take 4 mg by mouth at bedtime as needed for muscle spasms (muscle spasms).   Yes Historical Provider, MD  traZODone (DESYREL) 50 MG tablet Take 50 mg by mouth at bedtime as needed for sleep (sleep).    Yes Historical Provider, MD  Vitamin D, Ergocalciferol, (DRISDOL) 50000 UNITS CAPS capsule Take 50,000 Units by mouth every 7 (seven) days.   Yes Historical Provider, MD  cephALEXin (KEFLEX) 500 MG capsule Take 1 capsule (500 mg total) by mouth 4 (four) times daily. 06/29/14   Charlann Lange, PA-C  diazepam (VALIUM) 5 MG tablet Take 1 tablet (5 mg total) by mouth every 8 (eight) hours as needed for anxiety. Patient not taking: Reported on 06/29/2014 12/16/13   Jeannett Senior, PA-C  erythromycin ophthalmic ointment Place a 1/2 inch ribbon of ointment into the lower eyelid twice daily Patient not taking: Reported on 01/06/2015 08/23/14   Evelina Bucy, MD  ibuprofen (ADVIL,MOTRIN) 800 MG tablet Take 1 tablet (800 mg total) by mouth 3 (three) times daily. Patient not taking: Reported on 01/06/2015 08/23/14   Evelina Bucy, MD  meloxicam (MOBIC) 15 MG tablet Take 1 tablet (15 mg total) by mouth daily. Patient not taking: Reported on 01/06/2015 10/23/12   Jarrett Soho Muthersbaugh, PA-C  oxyCODONE-acetaminophen (PERCOCET) 5-325 MG per tablet Take 1 tablet by mouth every 4 (four) hours as needed for severe pain. Patient not taking: Reported on 06/29/2014 12/16/13   Lahoma Rocker Kirichenko, PA-C  predniSONE (DELTASONE) 20 MG tablet Take 2 tablets (40 mg total) by mouth daily. Patient not taking: Reported on 01/06/2015 09/28/14   Antonietta Breach, PA-C   BP 136/98 mmHg  Temp(Src) 98.1 F (36.7 C) (Oral)  Resp 16  SpO2 99%  LMP 12/27/2014 (Exact Date)  Vital signs normal    Physical Exam  Constitutional: She is oriented to person, place, and time. She  appears well-developed and well-nourished.  Non-toxic appearance. She does not appear ill. No distress.  HENT:  Head: Normocephalic and atraumatic.  Right Ear: External ear normal.  Left Ear: External ear normal.  Nose: Nose normal. No mucosal edema or rhinorrhea.  Mouth/Throat: Oropharynx is clear and moist and mucous membranes are normal. No dental abscesses or uvula swelling.  Eyes: Conjunctivae and EOM are normal. Pupils are equal, round, and reactive to light.  Neck: Normal range of motion and full passive range of motion without pain. Neck supple.  Cardiovascular: Normal rate, regular rhythm and normal heart sounds.  Exam reveals no gallop and no friction rub.   No murmur heard. Pulmonary/Chest: Effort normal and breath sounds normal. No respiratory distress. She has no wheezes. She has no rhonchi. She has no rales. She exhibits no tenderness and no crepitus.  Abdominal: Soft.  Normal appearance and bowel sounds are normal. She exhibits no distension. There is no tenderness. There is no rebound and no guarding.  Genitourinary:  There is some dark blood in the vault. Her uterus felt mildly enlarged but was non tender. She had bilateral adenexal tender without enlargement.   Musculoskeletal: Normal range of motion. She exhibits no edema or tenderness.  Moves all extremities well.   Neurological: She is alert and oriented to person, place, and time. She has normal strength. No cranial nerve deficit.  Skin: Skin is warm, dry and intact. No rash noted. No erythema. No pallor.  Psychiatric: She has a normal mood and affect. Her speech is normal and behavior is normal. Her mood appears not anxious.  Nursing note and vitals reviewed.   ED Course  Procedures (including critical care time)  Medications  ketorolac (TORADOL) 30 MG/ML injection 30 mg (30 mg Intravenous Given 01/06/15 0148)    DIAGNOSTIC STUDIES: Oxygen Saturation is 99% on RA, normal by my interpretation.    COORDINATION OF  CARE: 1:31 AM Discussed treatment plan with pt at bedside and pt agreed to plan.   Recheck at time of discharge. Patient was given her test results. She states the Toradol has helped with her pain. She's advised to follow-up with Dr. Ruthann Cancer she's not improving. She also was advised if her symptoms get worse to go to [facility] to the maternity admissions unit.  Labs Review Results for orders placed or performed during the hospital encounter of 01/05/15  Wet prep, genital  Result Value Ref Range   Yeast Wet Prep HPF POC NONE SEEN NONE SEEN   Trich, Wet Prep FEW (A) NONE SEEN   Clue Cells Wet Prep HPF POC NONE SEEN NONE SEEN   WBC, Wet Prep HPF POC FEW (A) NONE SEEN  CBC with Differential  Result Value Ref Range   WBC 9.3 4.0 - 10.5 K/uL   RBC 4.55 3.87 - 5.11 MIL/uL   Hemoglobin 13.2 12.0 - 15.0 g/dL   HCT 39.5 36.0 - 46.0 %   MCV 86.8 78.0 - 100.0 fL   MCH 29.0 26.0 - 34.0 pg   MCHC 33.4 30.0 - 36.0 g/dL   RDW 14.1 11.5 - 15.5 %   Platelets 409 (H) 150 - 400 K/uL   Neutrophils Relative % 63 %   Neutro Abs 5.8 1.7 - 7.7 K/uL   Lymphocytes Relative 30 %   Lymphs Abs 2.7 0.7 - 4.0 K/uL   Monocytes Relative 7 %   Monocytes Absolute 0.7 0.1 - 1.0 K/uL   Eosinophils Relative 0 %   Eosinophils Absolute 0.0 0.0 - 0.7 K/uL   Basophils Relative 0 %   Basophils Absolute 0.0 0.0 - 0.1 K/uL  Basic metabolic panel  Result Value Ref Range   Sodium 141 135 - 145 mmol/L   Potassium 3.7 3.5 - 5.1 mmol/L   Chloride 106 101 - 111 mmol/L   CO2 23 22 - 32 mmol/L   Glucose, Bld 103 (H) 65 - 99 mg/dL   BUN 11 6 - 20 mg/dL   Creatinine, Ser 0.65 0.44 - 1.00 mg/dL   Calcium 9.1 8.9 - 10.3 mg/dL   GFR calc non Af Amer >60 >60 mL/min   GFR calc Af Amer >60 >60 mL/min   Anion gap 12 5 - 15  Urinalysis, Routine w reflex microscopic  Result Value Ref Range   Color, Urine YELLOW YELLOW   APPearance CLOUDY (A) CLEAR   Specific Gravity, Urine  1.034 (H) 1.005 - 1.030   pH 5.0 5.0 - 8.0    Glucose, UA NEGATIVE NEGATIVE mg/dL   Hgb urine dipstick SMALL (A) NEGATIVE   Bilirubin Urine SMALL (A) NEGATIVE   Ketones, ur NEGATIVE NEGATIVE mg/dL   Protein, ur NEGATIVE NEGATIVE mg/dL   Urobilinogen, UA 1.0 0.0 - 1.0 mg/dL   Nitrite NEGATIVE NEGATIVE   Leukocytes, UA NEGATIVE NEGATIVE  Urine microscopic-add on  Result Value Ref Range   RBC / HPF 0-2 <3 RBC/hpf   Bacteria, UA MANY (A) RARE   Urine-Other MUCOUS PRESENT   I-Stat beta hCG blood, ED  Result Value Ref Range   I-stat hCG, quantitative <5.0 <5 mIU/mL   Comment 3           Laboratory interpretation all normal except concentrated urine, some hematuria however patient voided in a cup while on menses, plus trichomoniasis   MDM   Final diagnoses:  DUB (dysfunctional uterine bleeding)  Trichomoniasis    New Prescriptions   METRONIDAZOLE (FLAGYL) 500 MG TABLET    Take 1 tablet (500 mg total) by mouth 2 (two) times daily.   NAPROXEN (NAPROSYN) 500 MG TABLET    Take 1 po BID with food prn pain    Plan discharge  Margaret Porter, MD, Barbette Or, MD 01/06/15 [phone]

## 2015-01-06 NOTE — Discharge Instructions (Signed)
Take the naproxen for pain and bleeding. Take the antibiotics until gone. Please call Dr. Marcheta Grammes office to get an appointment to have him recheck you this week. However if you seem to be getting worse instead of better, go to [facility] to the maternity admissions unit. Abnormal Uterine Bleeding Abnormal uterine bleeding can affect women at various stages in life, including teenagers, women in their reproductive years, pregnant women, and women who have reached menopause. Several kinds of uterine bleeding are considered abnormal, including:  Bleeding or spotting between periods.   Bleeding after sexual intercourse.   Bleeding that is heavier or more than normal.   Periods that last longer than usual.  Bleeding after menopause.  Many cases of abnormal uterine bleeding are minor and simple to treat, while others are more serious. Any type of abnormal bleeding should be evaluated by your health care provider. Treatment will depend on the cause of the bleeding. HOME CARE INSTRUCTIONS Monitor your condition for any changes. The following actions may help to alleviate any discomfort you are experiencing:  Avoid the use of tampons and douches as directed by your health care provider.  Change your pads frequently. You should get regular pelvic exams and Pap tests. Keep all follow-up appointments for diagnostic tests as directed by your health care provider.  SEEK MEDICAL CARE IF:   Your bleeding lasts more than 1 week.   You feel dizzy at times.  SEEK IMMEDIATE MEDICAL CARE IF:   You pass out.   You are changing pads every 15 to 30 minutes.   You have abdominal pain.  You have a fever.   You become sweaty or weak.   You are passing large blood clots from the vagina.   You start to feel nauseous and vomit. MAKE SURE YOU:   Understand these instructions.  Will watch your condition.  Will get help right away if you are not doing well or get worse. Document  Released: 03/30/2005 Document Revised: 04/04/2013 Document Reviewed: 10/27/2012 [facility] Patient Information 2015 [place], [name]. This information is not intended to replace advice given to you by your health care provider. Make sure you discuss any questions you have with your health care provider.

## 2015-01-06 NOTE — ED Notes (Signed)
Pt aware urine specimen is needed. 

## 2015-01-06 NOTE — ED Notes (Signed)
Pt states that she began her normal cycle on the 9/14. Says her cramping, bleeding, discomfort usually pass after no more than 5 days. This time though, she has continued to have cramping pain, nausea, discomfort and vaginal bleeding. Approximates using a new pad every 3hrs each day. Has had imaging done in the past which revealed a "fibroid and a cyst in the uterus and on an ovary". She worries that one of them may have ruptured.

## 2015-01-06 NOTE — ED Notes (Signed)
Pt alert,oriented, and ambulatory upon DC. She was advised to follow up with OB-GYN this week adn ot go to [facility] if not better.

## 2015-01-07 LAB — GC/CHLAMYDIA PROBE AMP (~~LOC~~) NOT AT ARMC
CHLAMYDIA, DNA PROBE: NEGATIVE
NEISSERIA GONORRHEA: NEGATIVE

## 2015-01-28 LAB — PROCEDURE REPORT - SCANNED: Pap: NEGATIVE

## 2015-02-13 ENCOUNTER — Other Ambulatory Visit (HOSPITAL_COMMUNITY): Payer: Self-pay | Admitting: Obstetrics

## 2015-02-13 DIAGNOSIS — N938 Other specified abnormal uterine and vaginal bleeding: Secondary | ICD-10-CM

## 2015-02-18 ENCOUNTER — Ambulatory Visit (HOSPITAL_COMMUNITY)
Admission: RE | Admit: 2015-02-18 | Discharge: 2015-02-18 | Disposition: A | Discharge status: Home / Self Care | Payer: Medicaid Other | Source: Ambulatory Visit | Attending: Obstetrics | Admitting: Obstetrics

## 2015-02-18 DIAGNOSIS — D251 Intramural leiomyoma of uterus: Secondary | ICD-10-CM | POA: Diagnosis not present

## 2015-02-18 DIAGNOSIS — N938 Other specified abnormal uterine and vaginal bleeding: Secondary | ICD-10-CM | POA: Diagnosis not present

## 2015-03-04 ENCOUNTER — Emergency Department (HOSPITAL_COMMUNITY): Payer: Medicaid Other

## 2015-03-04 ENCOUNTER — Emergency Department (HOSPITAL_COMMUNITY)
Admission: EM | Admit: 2015-03-04 | Discharge: 2015-03-05 | Disposition: A | Discharge status: Home / Self Care | Payer: Medicaid Other | Attending: Emergency Medicine | Admitting: Emergency Medicine

## 2015-03-04 ENCOUNTER — Encounter (HOSPITAL_COMMUNITY): Payer: Self-pay | Admitting: *Deleted

## 2015-03-04 DIAGNOSIS — M5442 Lumbago with sciatica, left side: Secondary | ICD-10-CM

## 2015-03-04 DIAGNOSIS — S79911A Unspecified injury of right hip, initial encounter: Secondary | ICD-10-CM | POA: Diagnosis not present

## 2015-03-04 DIAGNOSIS — Z91040 Latex allergy status: Secondary | ICD-10-CM | POA: Insufficient documentation

## 2015-03-04 DIAGNOSIS — G8929 Other chronic pain: Secondary | ICD-10-CM | POA: Diagnosis not present

## 2015-03-04 DIAGNOSIS — Z7951 Long term (current) use of inhaled steroids: Secondary | ICD-10-CM | POA: Diagnosis not present

## 2015-03-04 DIAGNOSIS — J45909 Unspecified asthma, uncomplicated: Secondary | ICD-10-CM | POA: Diagnosis not present

## 2015-03-04 DIAGNOSIS — M7061 Trochanteric bursitis, right hip: Secondary | ICD-10-CM | POA: Diagnosis not present

## 2015-03-04 DIAGNOSIS — Z79899 Other long term (current) drug therapy: Secondary | ICD-10-CM | POA: Diagnosis not present

## 2015-03-04 DIAGNOSIS — Z880 Allergy status to penicillin: Secondary | ICD-10-CM | POA: Insufficient documentation

## 2015-03-04 DIAGNOSIS — K219 Gastro-esophageal reflux disease without esophagitis: Secondary | ICD-10-CM | POA: Diagnosis not present

## 2015-03-04 DIAGNOSIS — S3992XA Unspecified injury of lower back, initial encounter: Secondary | ICD-10-CM | POA: Diagnosis not present

## 2015-03-04 DIAGNOSIS — Y9289 Other specified places as the place of occurrence of the external cause: Secondary | ICD-10-CM | POA: Insufficient documentation

## 2015-03-04 DIAGNOSIS — S79912A Unspecified injury of left hip, initial encounter: Secondary | ICD-10-CM | POA: Diagnosis not present

## 2015-03-04 DIAGNOSIS — Z3202 Encounter for pregnancy test, result negative: Secondary | ICD-10-CM | POA: Insufficient documentation

## 2015-03-04 DIAGNOSIS — Z792 Long term (current) use of antibiotics: Secondary | ICD-10-CM | POA: Diagnosis not present

## 2015-03-04 DIAGNOSIS — Z7952 Long term (current) use of systemic steroids: Secondary | ICD-10-CM | POA: Diagnosis not present

## 2015-03-04 DIAGNOSIS — M1990 Unspecified osteoarthritis, unspecified site: Secondary | ICD-10-CM | POA: Insufficient documentation

## 2015-03-04 DIAGNOSIS — Y998 Other external cause status: Secondary | ICD-10-CM | POA: Diagnosis not present

## 2015-03-04 DIAGNOSIS — W108XXA Fall (on) (from) other stairs and steps, initial encounter: Secondary | ICD-10-CM | POA: Diagnosis not present

## 2015-03-04 DIAGNOSIS — M7062 Trochanteric bursitis, left hip: Secondary | ICD-10-CM | POA: Diagnosis not present

## 2015-03-04 DIAGNOSIS — M25552 Pain in left hip: Secondary | ICD-10-CM

## 2015-03-04 DIAGNOSIS — S80812A Abrasion, left lower leg, initial encounter: Secondary | ICD-10-CM | POA: Insufficient documentation

## 2015-03-04 DIAGNOSIS — M5441 Lumbago with sciatica, right side: Secondary | ICD-10-CM

## 2015-03-04 DIAGNOSIS — Y9301 Activity, walking, marching and hiking: Secondary | ICD-10-CM | POA: Diagnosis not present

## 2015-03-04 DIAGNOSIS — M25551 Pain in right hip: Secondary | ICD-10-CM

## 2015-03-04 DIAGNOSIS — M5440 Lumbago with sciatica, unspecified side: Secondary | ICD-10-CM | POA: Diagnosis not present

## 2015-03-04 DIAGNOSIS — I10 Essential (primary) hypertension: Secondary | ICD-10-CM | POA: Insufficient documentation

## 2015-03-04 HISTORY — DX: Other muscle spasm: M62.838

## 2015-03-04 MED ORDER — HYDROCODONE-ACETAMINOPHEN 5-325 MG PO TABS
2.0000 | ORAL_TABLET | Freq: Once | ORAL | Status: AC
  Administered 2015-03-05: 2 via ORAL
  Filled 2015-03-04: qty 2

## 2015-03-04 NOTE — ED Notes (Signed)
Pt talking on phone at this time. Pt was told she has medications for pain whenever she is finished with her phone conversation

## 2015-03-04 NOTE — ED Notes (Signed)
Pt was told she needs to give a urine sample prior to her xray. Pt still on cell phone

## 2015-03-04 NOTE — ED Notes (Signed)
PT arrives to the ER via EMS for complaints of bilateral hip pain; pt states that she has chronic hip pain with arthritis and bursitis to her hips; pt states that she fell going down the stairs approx 1 hr ago and states that it has aggravated her chronic pain; pt c/o throbbing and burning to rt hip and thigh area

## 2015-03-05 ENCOUNTER — Emergency Department (HOSPITAL_COMMUNITY): Payer: Medicaid Other

## 2015-03-05 LAB — POC URINE PREG, ED: PREG TEST UR: NEGATIVE

## 2015-03-05 MED ORDER — METHOCARBAMOL 500 MG PO TABS: 500.0000 mg | ORAL_TABLET | Freq: Two times a day (BID) | ORAL | Status: DC

## 2015-03-05 MED ORDER — NAPROXEN 500 MG PO TABS: 500.0000 mg | ORAL_TABLET | Freq: Two times a day (BID) | ORAL | Status: DC

## 2015-03-05 NOTE — ED Provider Notes (Signed)
CSN: [id number]     Arrival date & time 03/04/15  2242 History   First MD Initiated Contact with Patient 03/04/15 2340     Chief Complaint  Patient presents with  . Hip Pain     (Consider location/radiation/quality/duration/timing/severity/associated sxs/prior Treatment) Patient is a 44 y.o. female presenting with hip pain. The history is provided by the patient and medical records. No language interpreter was used.  Hip Pain Associated symptoms include arthralgias. Pertinent negatives include no abdominal pain, chest pain, coughing, diaphoresis, fatigue, fever, headaches, nausea, rash or vomiting.     Margaret Owens is a 44 y.o. female  with a hx of arthritis, asthma, bursitis, GERD, hypertension, sciatica, chronic muscle spasms of both lower extremities presents to the Emergency Department complaining of an acute fall approx 1 hour PTA.  Pt reports a long history of bilateral hip pain or back pain and sciatica. She reports she normally walks with a cane. She reports she was walking down a set of stairs when she had a muscle spasm of her right leg causing her to fall down 2 stairs. She denies hitting her head or loss of consciousness. She denies neck pain or headache. She does report low back pain. Patient reports she has had intermittent radiation of pain and paresthesias from the low back down the bilateral posterior thighs for the last several days.  No treatments prior to arrival. No known aggravating or alleviating factors. Nothing seems to make the symptoms better or worse. Patient also reports that she has a history of chronic trochanteric bursitis in the fall today has aggravated her hip pain. She reports an increase in right hip pain described as throbbing and burning, rated at a 6/10.  She denies numbness in her legs, weakness, loss of bowel or bladder control, foot drop, increased difficulty walking.  She reports an associated abrasion to the left lower leg.  Past Medical History   Diagnosis Date  . Arthritis   . Asthma   . Bursitis   . GERD (gastroesophageal reflux disease)   . Hypertension   . Sciatica   . Muscle spasms of both lower extremities    Past Surgical History  Procedure Laterality Date  . Colonoscopy     Family History  Problem Relation Age of Onset  . Osteoarthritis Mother   . Cancer Mother    Social History  Substance Use Topics  . Smoking status: Never Smoker   . Smokeless tobacco: None  . Alcohol Use: No   OB History    No data available     Review of Systems  Constitutional: Negative for fever, diaphoresis, appetite change, fatigue and unexpected weight change.  HENT: Negative for mouth sores.   Eyes: Negative for visual disturbance.  Respiratory: Negative for cough, chest tightness, shortness of breath and wheezing.   Cardiovascular: Negative for chest pain.  Gastrointestinal: Negative for nausea, vomiting, abdominal pain, diarrhea and constipation.  Endocrine: Negative for polydipsia, polyphagia and polyuria.  Genitourinary: Negative for dysuria, urgency, frequency and hematuria.  Musculoskeletal: Positive for back pain and arthralgias. Negative for neck stiffness.       Hip pain  Skin: Positive for wound. Negative for rash.  Allergic/Immunologic: Negative for immunocompromised state.  Neurological: Negative for syncope, light-headedness and headaches.  Hematological: Does not bruise/bleed easily.  Psychiatric/Behavioral: Negative for sleep disturbance. The patient is not nervous/anxious.       Allergies  Latex and Penicillins  Home Medications   Prior to Admission medications   Medication Sig  Start Date End Date Taking? Authorizing Provider  albuterol (PROVENTIL HFA;VENTOLIN HFA) 108 (90 BASE) MCG/ACT inhaler Inhale 2 puffs into the lungs every 6 (six) hours as needed for wheezing (wheezing).     Historical Provider, MD  budesonide-formoterol (SYMBICORT) 160-4.5 MCG/ACT inhaler Inhale 2 puffs into the lungs 2 (two)  times daily as needed (shortness of breath.).     Historical Provider, MD  cephALEXin (KEFLEX) 500 MG capsule Take 1 capsule (500 mg total) by mouth 4 (four) times daily. 06/29/14   Charlann Lange, PA-C  cetirizine (ZYRTEC) 10 MG tablet Take 10 mg by mouth daily.    Historical Provider, MD  citalopram (CELEXA) 10 MG tablet Take 10 mg by mouth daily.    Historical Provider, MD  desonide (DESOWEN) 0.05 % cream Apply 1 application topically 2 (two) times daily.    Historical Provider, MD  diclofenac sodium (VOLTAREN) 1 % GEL Apply 2 g topically 4 (four) times daily as needed (apply to leg).     Historical Provider, MD  fluticasone (FLONASE) 50 MCG/ACT nasal spray Place 2 sprays into both nostrils daily.    Historical Provider, MD  hydroquinone 4 % cream Apply 1 application topically daily as needed (dark spots).    Historical Provider, MD  methocarbamol (ROBAXIN) 500 MG tablet Take 1 tablet (500 mg total) by mouth 2 (two) times daily. 03/05/15   Natali Lavallee, PA-C  metroNIDAZOLE (FLAGYL) 500 MG tablet Take 1 tablet (500 mg total) by mouth 2 (two) times daily. 01/06/15   [name], MD  naproxen (NAPROSYN) 500 MG tablet Take 1 tablet (500 mg total) by mouth 2 (two) times daily with a meal. 03/05/15   Omaya Nieland, PA-C  pantoprazole (PROTONIX) 40 MG tablet Take 40 mg by mouth daily.    Historical Provider, MD  phentermine (ADIPEX-P) 37.5 MG tablet Take 37.5 mg by mouth daily before breakfast.    Historical Provider, MD  tiZANidine (ZANAFLEX) 4 MG tablet Take 4 mg by mouth at bedtime as needed for muscle spasms (muscle spasms).    Historical Provider, MD  traZODone (DESYREL) 50 MG tablet Take 50 mg by mouth at bedtime as needed for sleep (sleep).     Historical Provider, MD  Vitamin D, Ergocalciferol, (DRISDOL) 50000 UNITS CAPS capsule Take 50,000 Units by mouth every 7 (seven) days.    Historical Provider, MD   BP 106/77 mmHg  Pulse 66  Temp(Src) 98.3 F (36.8 C) (Oral)  Resp 16  Ht 5\' 9"   (1.753 m)  Wt 110.224 kg  BMI 35.87 kg/m2  SpO2 100%  LMP 02/15/2015 Physical Exam  Constitutional: She appears well-developed and well-nourished. No distress.  HENT:  Head: Normocephalic and atraumatic.  Mouth/Throat: Oropharynx is clear and moist. No oropharyngeal exudate.  Eyes: Conjunctivae are normal.  Neck: Normal range of motion. Neck supple.  Full ROM without pain  Cardiovascular: Normal rate, regular rhythm, normal heart sounds and intact distal pulses.   Pulmonary/Chest: Effort normal and breath sounds normal. No respiratory distress. She has no wheezes.  Abdominal: Soft. She exhibits no distension. There is no tenderness.  Musculoskeletal:  Full range of motion of the T-spine and L-spine No tenderness to palpation of the spinous processes of the T-spine or L-spine Mild tenderness to palpation of the paraspinous muscles of the L-spine Full range of motion of all joints of the lower extremity including hips, knees and ankles  Lymphadenopathy:    She has no cervical adenopathy.  Neurological: She is alert. She has normal reflexes.  Reflex Scores:      Bicep reflexes are 2+ on the right side and 2+ on the left side.      Brachioradialis reflexes are 2+ on the right side and 2+ on the left side.      Patellar reflexes are 2+ on the right side and 2+ on the left side.      Achilles reflexes are 2+ on the right side and 2+ on the left side. Speech is clear and goal oriented, follows commands Normal 5/5 strength in upper and lower extremities bilaterally including dorsiflexion and plantar flexion, strong and equal grip strength Sensation normal to light and sharp touch Moves extremities without ataxia, coordination intact Antalgic gait with cane; no foot drop or dragging of either leg Normal balance No Clonus   Skin: Skin is warm and dry. No rash noted. She is not diaphoretic. No erythema.  Small abrasion to the left lower leg  Psychiatric: She has a normal mood and affect.  Her behavior is normal.  Nursing note and vitals reviewed.   ED Course  Procedures (including critical care time) Labs Review Labs Reviewed  POC URINE PREG, ED    Imaging Review Dg Lumbar Spine Complete  03/05/2015  CLINICAL DATA:  Acutely worsening chronic lower back pain. Initial encounter. EXAM: LUMBAR SPINE - COMPLETE 4+ VIEW COMPARISON:  MRI of the lumbar spine performed 10/14/2014 FINDINGS: There is no evidence of acute fracture or subluxation. Vertebral bodies demonstrate normal height. Minimal grade 1 anterolisthesis is noted of L4 on L5, with mild associated disc space narrowing. The visualized neural foramina are grossly unremarkable in appearance. The visualized bowel gas pattern is unremarkable in appearance; air and stool are noted within the colon. The sacroiliac joints are within normal limits. IMPRESSION: No evidence of acute fracture or subluxation along the lumbar spine. Electronically Signed   By: Garald Balding M.D.   On: 03/05/2015 01:54   Dg Hips Bilat With Pelvis 2v  03/05/2015  CLINICAL DATA:  Acute onset of bilateral hip pain, worse on the right, status post fall down stairs. Initial encounter. EXAM: DG HIP (WITH OR WITHOUT PELVIS) 2V BILAT COMPARISON:  None. FINDINGS: There is no evidence of fracture or dislocation. Both femoral heads are seated normally within their respective acetabula. The proximal femurs appear intact. No significant degenerative change is appreciated. The sacroiliac joints are unremarkable in appearance. The visualized bowel gas pattern is grossly unremarkable in appearance. IMPRESSION: No evidence of fracture or dislocation. Electronically Signed   By: Garald Balding M.D.   On: 03/05/2015 00:59   I have personally reviewed and evaluated these images and lab results as part of my medical decision-making.   EKG Interpretation None      MDM   Final diagnoses:  Bilateral hip pain  Bilateral low back pain with sciatica, sciatica laterality  unspecified  Trochanteric bursitis of both hips   Margaret Owens presents with exacerbation of her chronic pain after fall today. Patient with history of sciatica and has had radiation of paresthesias done the bilateral legs for the last several days. On exam legs are strong at a 5/5 and sensation is intact.  X-rays of the hip and low back show no acute fracture or subluxation. No palpable deformity to either.  3:49 AM Pt ambulatory without difficulty in the ED.  No further paresthesia of the legs.  No weakness.  Pain controlled. Recommend close follow-up with primary care physician.  BP 106/77 mmHg  Pulse 66  Temp(Src) 98.3 F (  36.8 C) (Oral)  Resp 16  Ht 5\' 9"  (1.753 m)  Wt 110.224 kg  BMI 35.87 kg/m2  SpO2 100%  LMP 02/15/2015   Abigail Rosete, PA-C 03/05/15 [id number]  [name], MD 03/05/15 [phone]

## 2015-03-05 NOTE — ED Notes (Signed)
Pt ambulated with her cane for about 15 ft.

## 2015-03-05 NOTE — Discharge Instructions (Signed)

## 2015-04-01 ENCOUNTER — Encounter (HOSPITAL_COMMUNITY): Payer: Self-pay | Admitting: Emergency Medicine

## 2015-04-01 ENCOUNTER — Emergency Department (HOSPITAL_COMMUNITY): Payer: Medicaid Other

## 2015-04-01 ENCOUNTER — Emergency Department (HOSPITAL_COMMUNITY)
Admission: EM | Admit: 2015-04-01 | Discharge: 2015-04-01 | Disposition: A | Discharge status: Home / Self Care | Payer: Medicaid Other | Attending: Emergency Medicine | Admitting: Emergency Medicine

## 2015-04-01 DIAGNOSIS — Z880 Allergy status to penicillin: Secondary | ICD-10-CM | POA: Insufficient documentation

## 2015-04-01 DIAGNOSIS — M1990 Unspecified osteoarthritis, unspecified site: Secondary | ICD-10-CM | POA: Insufficient documentation

## 2015-04-01 DIAGNOSIS — J45909 Unspecified asthma, uncomplicated: Secondary | ICD-10-CM | POA: Diagnosis not present

## 2015-04-01 DIAGNOSIS — Z79899 Other long term (current) drug therapy: Secondary | ICD-10-CM | POA: Insufficient documentation

## 2015-04-01 DIAGNOSIS — Z91040 Latex allergy status: Secondary | ICD-10-CM | POA: Insufficient documentation

## 2015-04-01 DIAGNOSIS — M79645 Pain in left finger(s): Secondary | ICD-10-CM | POA: Insufficient documentation

## 2015-04-01 DIAGNOSIS — Z792 Long term (current) use of antibiotics: Secondary | ICD-10-CM | POA: Insufficient documentation

## 2015-04-01 DIAGNOSIS — Z7951 Long term (current) use of inhaled steroids: Secondary | ICD-10-CM | POA: Insufficient documentation

## 2015-04-01 DIAGNOSIS — I10 Essential (primary) hypertension: Secondary | ICD-10-CM | POA: Insufficient documentation

## 2015-04-01 DIAGNOSIS — Z791 Long term (current) use of non-steroidal anti-inflammatories (NSAID): Secondary | ICD-10-CM | POA: Diagnosis not present

## 2015-04-01 MED ORDER — IBUPROFEN 800 MG PO TABS
800.0000 mg | ORAL_TABLET | Freq: Once | ORAL | Status: AC
  Administered 2015-04-01: 800 mg via ORAL
  Filled 2015-04-01: qty 1

## 2015-04-01 MED ORDER — DOXYCYCLINE HYCLATE 100 MG PO CAPS: 100.0000 mg | ORAL_CAPSULE | Freq: Two times a day (BID) | ORAL | Status: DC

## 2015-04-01 NOTE — ED Provider Notes (Signed)
CSN: [id number]     Arrival date & time 04/01/15  1009 History   First MD Initiated Contact with Patient 04/01/15 1024     Chief Complaint  Patient presents with  . index finger pain     HPI   Margaret Owens is a 43 y.o. female with a PMH of arthritis, bursitis, HTN who presents to the ED with left index finger pain x 3 days. She states she initially noticed her pain after waking up in the morning. She reports her pain is constant. She denies exacerbating factors. She has not tried anything for symptom relief. She denies numbness, weakness, paresthesia, recent injury.   Past Medical History  Diagnosis Date  . Arthritis   . Asthma   . Bursitis   . GERD (gastroesophageal reflux disease)   . Hypertension   . Sciatica   . Muscle spasms of both lower extremities    Past Surgical History  Procedure Laterality Date  . Colonoscopy     Family History  Problem Relation Age of Onset  . Osteoarthritis Mother   . Cancer Mother    Social History  Substance Use Topics  . Smoking status: Never Smoker   . Smokeless tobacco: None  . Alcohol Use: No   OB History    No data available      Review of Systems  Musculoskeletal: Positive for arthralgias.  Neurological: Negative for weakness and numbness.      Allergies  Latex and Penicillins  Home Medications   Prior to Admission medications   Medication Sig Start Date End Date Taking? Authorizing Provider  albuterol (PROVENTIL HFA;VENTOLIN HFA) 108 (90 BASE) MCG/ACT inhaler Inhale 2 puffs into the lungs every 6 (six) hours as needed for wheezing (wheezing).     Historical Provider, MD  budesonide-formoterol (SYMBICORT) 160-4.5 MCG/ACT inhaler Inhale 2 puffs into the lungs 2 (two) times daily as needed (shortness of breath.).     Historical Provider, MD  cephALEXin (KEFLEX) 500 MG capsule Take 1 capsule (500 mg total) by mouth 4 (four) times daily. 06/29/14   Charlann Lange, PA-C  cetirizine (ZYRTEC) 10 MG tablet Take 10 mg by mouth  daily.    Historical Provider, MD  citalopram (CELEXA) 10 MG tablet Take 10 mg by mouth daily.    Historical Provider, MD  desonide (DESOWEN) 0.05 % cream Apply 1 application topically 2 (two) times daily.    Historical Provider, MD  diclofenac sodium (VOLTAREN) 1 % GEL Apply 2 g topically 4 (four) times daily as needed (apply to leg).     Historical Provider, MD  doxycycline (VIBRAMYCIN) 100 MG capsule Take 1 capsule (100 mg total) by mouth 2 (two) times daily. 04/01/15   Marella Chimes, PA-C  fluticasone (FLONASE) 50 MCG/ACT nasal spray Place 2 sprays into both nostrils daily.    Historical Provider, MD  hydroquinone 4 % cream Apply 1 application topically daily as needed (dark spots).    Historical Provider, MD  methocarbamol (ROBAXIN) 500 MG tablet Take 1 tablet (500 mg total) by mouth 2 (two) times daily. 03/05/15   Hannah Muthersbaugh, PA-C  metroNIDAZOLE (FLAGYL) 500 MG tablet Take 1 tablet (500 mg total) by mouth 2 (two) times daily. 01/06/15   [name], MD  naproxen (NAPROSYN) 500 MG tablet Take 1 tablet (500 mg total) by mouth 2 (two) times daily with a meal. 03/05/15   Hannah Muthersbaugh, PA-C  pantoprazole (PROTONIX) 40 MG tablet Take 40 mg by mouth daily.    Historical Provider,  MD  phentermine (ADIPEX-P) 37.5 MG tablet Take 37.5 mg by mouth daily before breakfast.    Historical Provider, MD  tiZANidine (ZANAFLEX) 4 MG tablet Take 4 mg by mouth at bedtime as needed for muscle spasms (muscle spasms).    Historical Provider, MD  traZODone (DESYREL) 50 MG tablet Take 50 mg by mouth at bedtime as needed for sleep (sleep).     Historical Provider, MD  Vitamin D, Ergocalciferol, (DRISDOL) 50000 UNITS CAPS capsule Take 50,000 Units by mouth every 7 (seven) days.    Historical Provider, MD    BP 114/92 mmHg  Pulse 93  Temp(Src) 98.2 F (36.8 C) (Oral)  Resp 16  SpO2 98%  LMP 03/03/2015 Physical Exam  Constitutional: She is oriented to person, place, and time. She appears  well-developed and well-nourished. No distress.  HENT:  Head: Normocephalic and atraumatic.  Right Ear: External ear normal.  Left Ear: External ear normal.  Nose: Nose normal.  Eyes: Conjunctivae and EOM are normal. Right eye exhibits no discharge. Left eye exhibits no discharge. No scleral icterus.  Neck: Normal range of motion. Neck supple.  Cardiovascular: Normal rate and regular rhythm.   Pulmonary/Chest: Effort normal and breath sounds normal. No respiratory distress.  Musculoskeletal: Normal range of motion. She exhibits no edema or tenderness.  Diffuse TTP of palmar aspect of distal left index finger with decreased ROM of DIP due to pain. Mild erythema. No fluctuance. Cap refill <3 seconds.  Neurological: She is alert and oriented to person, place, and time.  Skin: Skin is warm and dry. She is not diaphoretic.  Psychiatric: She has a normal mood and affect. Her behavior is normal.  Nursing note and vitals reviewed.   ED Course  Procedures (including critical care time)  Labs Review Labs Reviewed - No data to display  Imaging Review Dg Hand Complete Left  04/01/2015  CLINICAL DATA:  Worsening of left index finger pain for 3 days. No known injury. Initial encounter. EXAM: LEFT HAND - COMPLETE 3+ VIEW COMPARISON:  None. FINDINGS: There is no evidence of fracture or dislocation. There is no evidence of arthropathy or other focal bone abnormality. Soft tissues are unremarkable. IMPRESSION: Normal examination. Electronically Signed   By: Inge Rise M.D.   On: 04/01/2015 11:31   I have personally reviewed and evaluated these images as part of my medical decision-making.   EKG Interpretation None      MDM   Final diagnoses:  Finger pain, left    44 year old female presents with left index finger pain x 3 days. Denies injury. Denies numbness, weakness, paresthesia. Patient is afebrile. Vital signs stable. On exam, patient has diffuse tenderness to palpation of the  palmar aspect of her distal left index finger with decreased range of motion of DIP due to pain and mild erythema. No fluctuance. Capillary refill less than 3 seconds. Imaging of left hand negative for fracture, dislocation, arthropathy, focal bone abnormality, soft tissue swelling. No evidence of abscess, however given erythema, will treat with doxycycline for possible cellulitis. Patient to follow up with hand surgery for persistent or worsening symptoms. Return precautions discussed. Patient verbalizes her understanding and is in agreement with plan.  BP 114/92 mmHg  Pulse 93  Temp(Src) 98.2 F (36.8 C) (Oral)  Resp 16  SpO2 98%  LMP 03/03/2015     Marella Chimes, PA-C 04/01/15 1303  Pattricia Boss, MD 04/03/15 [phone]

## 2015-04-01 NOTE — ED Notes (Signed)
Per pt, states left index finger pain for 3 days-no injury

## 2015-04-01 NOTE — Discharge Instructions (Signed)
1. Medications: doxycycline, usual home medications 2. Treatment: rest, drink plenty of fluids, warm compresses, elevate 3. Follow Up: please followup with your primary doctor for discussion of your diagnoses and further evaluation after today's visit; please follow-up with hand surgery for persistent symptoms; if you do not have a primary care doctor use the resource guide provided to find one; please return to the ER for new or worsening symptoms, increased pain/redness/swelling   Emergency Department Resource Guide 1) Find a Doctor and Pay Out of Pocket Although you won't have to find out who is covered by your insurance plan, it is a good idea to ask around and get recommendations. You will then need to call the office and see if the doctor you have chosen will accept you as a new patient and what types of options they offer for patients who are self-pay. Some doctors offer discounts or will set up payment plans for their patients who do not have insurance, but you will need to ask so you aren't surprised when you get to your appointment.  2) Contact Your Local Health Department Not all health departments have doctors that can see patients for sick visits, but many do, so it is worth a call to see if yours does. If you don't know where your local health department is, you can check in your phone book. The CDC also has a tool to help you locate your state's health department, and many state websites also have listings of all of their local health departments.  3) Find a [facility] If your illness is not likely to be very severe or complicated, you may want to try a walk in clinic. These are popping up all over the country in pharmacies, drugstores, and shopping centers. They're usually staffed by nurse practitioners or physician assistants that have been trained to treat common illnesses and complaints. They're usually fairly quick and inexpensive. However, if you have serious medical issues or  chronic medical problems, these are probably not your best option.  No Primary Care Doctor: - Call Health Connect at  [phone] - they can help you locate a primary care doctor that  accepts your insurance, provides certain services, etc. - Physician Referral Service- [phone]  Chronic Pain Problems: Organization         Address  Phone   Notes  [facility]  [phone] Patients need to be referred by their primary care doctor.   Medication Assistance: Organization         Address  Phone   Notes  [facility] Medication [facility] [address] [place], [place] [address] [phone] --Must be a resident of [facility] -- Must have NO insurance coverage whatsoever (no Medicaid/ Medicare, etc.) -- The pt. MUST have a primary care doctor that directs their care regularly and follows them in the community   MedAssist  [phone]   [employer]  [phone]    Agencies that provide inexpensive medical care: Organization         Address  Phone   Notes  [place]  [phone]   [facility]    [phone]   [facility] [place], [place] [address] [phone]   [facility] [address], [place] [phone]   Planned Parenthood    [phone]   [facility]    [phone]   Community  Health and [place]  [address], [place] Phone:  [phone], Fax:  [phone] Hours of Operation:  9 am - 6 pm, M-F.  Also accepts Medicaid/Medicare and self-pay.  [facility] for [place] [place], [address], [place] Phone: [phone], Fax: [phone]. Hours of Operation:  8:30 am - 5:30 pm, M-F.  Also accepts Medicaid and self-pay.  [facility] [place] [address], [place] Phone: [phone]   [place], [place], [place]  [phone], Ext. [phone] Mondays & Thursdays: 7-9 AM.  First 15 patients are seen on a first come, first serve basis.    [place] Providers:  Organization         Address  Phone   Notes  [facility] [address], [place] [phone] Also accepts self-pay patients.  [facility] [id number] [place], [place]  [phone]   [place], [address], [place] [phone]   [facility] [address], [place] [phone]   Lucianne Lei [address], [place]   [phone] Only accepts [place] Access [place] patients after they have their name applied to their card.   Self-Pay (no insurance) in [facility]:  Organization         Address  Phone   Notes  Sickle Cell Patients, [facility] [place] [phone]   [facility] [place] [phone]   [facility] [place]  [place], [place], [place] [phone]   [facility]/Dr. Osei-Bonsu  [address], [place] or [address], [place] [phone] Phone number for both [place] and [place] locations is the same.  Urgent Medical and [facility] [address] [place] [phone]   [facility] [address] [place] or [address] [phone]   [facility] [address], [place] [phone], phone; [phone], fax Sees patients 1st and 3rd Saturday of every month.  Must not qualify for public or private insurance (i.e. Medicaid, Medicare, [place] Health Choice, Veterans' Benefits)  Household income should be no more than 200% of the poverty level The clinic cannot treat you if you are pregnant or think you are pregnant  Sexually transmitted  diseases are not treated at the clinic.    Dental Care: Organization         Address  Phone  Notes  [facility] Department of [facility] [phone] Accepts children up to age 98 who are enrolled in [place] or [place]; pregnant women with a Medicaid card; and children who have applied for Medicaid or [place] Health Choice, but were declined, whose parents can pay a reduced fee at time of service.  [facility] Department of [facility]  [address], [place] [phone] Accepts children up to age 60 who are enrolled in [place] or [place]; pregnant women with a Medicaid card; and children who have applied for Medicaid or [place] Health Choice, but were declined, whose parents can pay a reduced fee at time of service.  [place] Adult Dental Access PROGRAM  [phone]  Lady [place], [place] [phone] Patients are seen by appointment only. Walk-ins are not accepted. [place] will see patients 61 years of age and older. Monday - Tuesday (8am-5pm) Most Wednesdays (8:30-5pm) $30 per visit, cash only  [facility] Adult Dental Access PROGRAM  [address], [facility] [phone] Patients are seen by appointment only. Walk-ins are not accepted. [place] will see patients 27 years of age and older. One Wednesday Evening (Monthly: Volunteer Based).  $30 per visit, cash only  [place]  [phone] for adults; Children under age 16, call Graduate Pediatric Dentistry at [phone]. Children aged 44-14, please call [phone] to request a pediatric application.  Dental services are provided in all areas of dental care including fillings, crowns and bridges, complete and partial dentures, implants, gum treatment, root canals, and extractions. Preventive care is also provided. Treatment is provided to both adults and children. Patients are selected via a  lottery and there is often a waiting list.   [facility] [address] [place]  [phone] [email or web address]   [facility] [address] [place], [place] [phone], Ext. [phone] Second and Fourth Thursday of each month, opens at 6:30 AM; Clinic ends at 9 AM.  Patients are seen on a first-come first-served basis, and a limited number are seen during each clinic.   [facility]  [address] Hillard Danker [place], [place] [phone]   Eligibility Requirements You must have lived in [place], [place], or [place] counties for at least the last three months.   You cannot be eligible for state or federal sponsored [employer], including [employer], [place], or [employer].   You generally cannot be eligible for healthcare insurance through your employer.    How to apply: Eligibility screenings are held every Tuesday and Wednesday afternoon from 1:00 pm until 4:00 pm. You do not need an appointment for the interview!  [facility] [address] [place], [place]   [place]  [place] Department  [place]  [phone]    Behavioral Health Resources in the Community: Intensive Outpatient Programs Organization         Address  Phone  Notes  [place]. [address] [place], [place] [phone]   [facility] [address], [place], [place]   ADS: Alcohol & Drug S[facility] [address] [place], [place]   [place] [address],  [place], [place] or [phone]   Substance Abuse Resources Organization         Address  Phone  Notes  Alcohol and Drug Services  [phone]   [place]  [phone]   [facility] [place]   Chinita Pester  [phone]   Residential &  Outpatient Substance Abuse Program  [phone]   Psychological Services Organization         Address  Phone  Notes  [facility] [place]  [place]  [phone]   [place] [address] [place] or [phone]    Mobile Crisis Teams Organization         Address  Phone  Notes  [facility], Mobile Crisis Care Unit  [phone]   [facility]  [address] [place], [place]   Ivin Booty  [place] [phone]    Self-Help/Support Groups Organization         Address  Phone             Notes  [facility] [place] - variety of support groups  [place] Call for more information  Narcotics Anonymous (NA), [facility] [address], [employer] [place]  2 meetings at this location   Special educational needs teacher         Address  Phone  Notes  [facility] Residential [facility] [place],    [place]  [phone]   [facility]  [address], [place] [address], [place], [place]   [place], [place] [phone] Admissions: 8am-3pm M-F  Incentives Substance [place] [address],    [place], [place] [phone]   [facility] [address] [place], [place], [place]   [facility] [address]  [place], [place]   [facility] - Intensive Outpatient [address] Kristeen Mans [address], [place], [place]   [facility] ([place].) [place].,  [place], [place] [phone] or [phone]   [facility] (RTS) [address] [place], [place] Accepts Medicaid  [facility] [address]  [place] [phone] Substance Abuse/Addiction Treatment   [facility] Organization          Address  Phone  Notes  [facility]  [phone]   Domenic Schwab, PhD [address] Arlis Porta [place], [place]   [phone] or [phone]   [place] Champion [place], [place] [phone]   [facility] [address] [place], [place] [phone] Insurance/Medicaid/sponsorship through [facility] and Families [address], [address] [place]                                    [place], [place] [phone] [place] [address], [place] [phone]    Dr. Adele Schilder  [phone]   [facility] Dept. 1) [address] [place] 2) [place] 3)  [place] [address], [place] [phone]  [phone]   [place] [phone] or [phone] (After Hours)

## 2015-04-27 ENCOUNTER — Emergency Department (HOSPITAL_COMMUNITY)
Admission: EM | Admit: 2015-04-27 | Discharge: 2015-04-28 | Disposition: A | Discharge status: Home / Self Care | Payer: Medicaid Other | Attending: Emergency Medicine | Admitting: Emergency Medicine

## 2015-04-27 ENCOUNTER — Encounter (HOSPITAL_COMMUNITY): Payer: Self-pay | Admitting: Emergency Medicine

## 2015-04-27 DIAGNOSIS — Z91040 Latex allergy status: Secondary | ICD-10-CM | POA: Diagnosis not present

## 2015-04-27 DIAGNOSIS — Z792 Long term (current) use of antibiotics: Secondary | ICD-10-CM | POA: Diagnosis not present

## 2015-04-27 DIAGNOSIS — M1990 Unspecified osteoarthritis, unspecified site: Secondary | ICD-10-CM | POA: Diagnosis not present

## 2015-04-27 DIAGNOSIS — I10 Essential (primary) hypertension: Secondary | ICD-10-CM | POA: Insufficient documentation

## 2015-04-27 DIAGNOSIS — K219 Gastro-esophageal reflux disease without esophagitis: Secondary | ICD-10-CM | POA: Insufficient documentation

## 2015-04-27 DIAGNOSIS — Z79899 Other long term (current) drug therapy: Secondary | ICD-10-CM | POA: Insufficient documentation

## 2015-04-27 DIAGNOSIS — J45901 Unspecified asthma with (acute) exacerbation: Secondary | ICD-10-CM | POA: Insufficient documentation

## 2015-04-27 DIAGNOSIS — Z880 Allergy status to penicillin: Secondary | ICD-10-CM | POA: Insufficient documentation

## 2015-04-27 DIAGNOSIS — R0602 Shortness of breath: Secondary | ICD-10-CM | POA: Diagnosis present

## 2015-04-27 DIAGNOSIS — R062 Wheezing: Secondary | ICD-10-CM

## 2015-04-27 DIAGNOSIS — Z7951 Long term (current) use of inhaled steroids: Secondary | ICD-10-CM | POA: Insufficient documentation

## 2015-04-27 MED ORDER — PREDNISONE 20 MG PO TABS
60.0000 mg | ORAL_TABLET | Freq: Once | ORAL | Status: AC
Start: 2015-04-27 — End: 2015-04-27
  Administered 2015-04-27: 60 mg via ORAL
  Filled 2015-04-27: qty 3

## 2015-04-27 MED ORDER — PREDNISONE 20 MG PO TABS: 40.0000 mg | ORAL_TABLET | Freq: Every day | ORAL | Status: DC

## 2015-04-27 NOTE — ED Notes (Signed)
Brought in by EMS from home with c/o shortness of breath.  Pt reports that she started having shortness of breath an hour ago while "cleaning".  Pt took her "rescue inhaler" but without relief.  Pt was wheezing on EMS' arrival.  Was given Albuterol neb tx initially, then Albuterol plus Atrovent neb tx en route.  Arrived to ED with decreased wheezing and relief of shortness of breath.

## 2015-04-27 NOTE — ED Provider Notes (Signed)
CSN: [id number]     Arrival date & time 04/27/15  2253 History   First MD Initiated Contact with Patient 04/27/15 2302     Chief Complaint  Patient presents with  . Shortness of Breath     (Consider location/radiation/quality/duration/timing/severity/associated sxs/prior Treatment) HPI Comments: Patient presents to the emergency department with chief complaint of wheezing and shortness of breath. Symptoms started earlier this evening. She states that she was cleaning. She reports that part of her ceiling collapsed, and she believes that the dust caused her to have an asthma attack. She used rescue inhaler with no relief. Called EMS, and was given a nebulizer treatment. Her symptoms have improved dramatically. She denies any more chest tightness. States the wheezing has resolved. She has an inhaler at home. Denies any fevers or chills or productive cough.  The history is provided by the patient. No language interpreter was used.    Past Medical History  Diagnosis Date  . Arthritis   . Asthma   . Bursitis   . GERD (gastroesophageal reflux disease)   . Hypertension   . Sciatica   . Muscle spasms of both lower extremities    Past Surgical History  Procedure Laterality Date  . Colonoscopy     Family History  Problem Relation Age of Onset  . Osteoarthritis Mother   . Cancer Mother    Social History  Substance Use Topics  . Smoking status: Never Smoker   . Smokeless tobacco: None  . Alcohol Use: No   OB History    No data available     Review of Systems  Constitutional: Negative for fever and chills.  Respiratory: Positive for shortness of breath and wheezing.   Cardiovascular: Negative for chest pain.  Gastrointestinal: Negative for nausea, vomiting, diarrhea and constipation.  Genitourinary: Negative for dysuria.      Allergies  Latex and Penicillins  Home Medications   Prior to Admission medications   Medication Sig Start Date End Date Taking? Authorizing  Provider  albuterol (PROVENTIL HFA;VENTOLIN HFA) 108 (90 BASE) MCG/ACT inhaler Inhale 2 puffs into the lungs every 6 (six) hours as needed for wheezing (wheezing).     Historical Provider, MD  budesonide-formoterol (SYMBICORT) 160-4.5 MCG/ACT inhaler Inhale 2 puffs into the lungs 2 (two) times daily as needed (shortness of breath.).     Historical Provider, MD  cephALEXin (KEFLEX) 500 MG capsule Take 1 capsule (500 mg total) by mouth 4 (four) times daily. 06/29/14   Charlann Lange, PA-C  cetirizine (ZYRTEC) 10 MG tablet Take 10 mg by mouth daily.    Historical Provider, MD  citalopram (CELEXA) 10 MG tablet Take 10 mg by mouth daily.    Historical Provider, MD  desonide (DESOWEN) 0.05 % cream Apply 1 application topically 2 (two) times daily.    Historical Provider, MD  diclofenac sodium (VOLTAREN) 1 % GEL Apply 2 g topically 4 (four) times daily as needed (apply to leg).     Historical Provider, MD  doxycycline (VIBRAMYCIN) 100 MG capsule Take 1 capsule (100 mg total) by mouth 2 (two) times daily. 04/01/15   Marella Chimes, PA-C  fluticasone (FLONASE) 50 MCG/ACT nasal spray Place 2 sprays into both nostrils daily.    Historical Provider, MD  hydroquinone 4 % cream Apply 1 application topically daily as needed (dark spots).    Historical Provider, MD  methocarbamol (ROBAXIN) 500 MG tablet Take 1 tablet (500 mg total) by mouth 2 (two) times daily. 03/05/15   Jarrett Soho Muthersbaugh, PA-C  metroNIDAZOLE (FLAGYL) 500 MG tablet Take 1 tablet (500 mg total) by mouth 2 (two) times daily. 01/06/15   [name], MD  naproxen (NAPROSYN) 500 MG tablet Take 1 tablet (500 mg total) by mouth 2 (two) times daily with a meal. 03/05/15   Hannah Muthersbaugh, PA-C  pantoprazole (PROTONIX) 40 MG tablet Take 40 mg by mouth daily.    Historical Provider, MD  phentermine (ADIPEX-P) 37.5 MG tablet Take 37.5 mg by mouth daily before breakfast.    Historical Provider, MD  tiZANidine (ZANAFLEX) 4 MG tablet Take 4 mg by mouth at  bedtime as needed for muscle spasms (muscle spasms).    Historical Provider, MD  traZODone (DESYREL) 50 MG tablet Take 50 mg by mouth at bedtime as needed for sleep (sleep).     Historical Provider, MD  Vitamin D, Ergocalciferol, (DRISDOL) 50000 UNITS CAPS capsule Take 50,000 Units by mouth every 7 (seven) days.    Historical Provider, MD   BP 102/66 mmHg  Pulse 84  Temp(Src) 98.1 F (36.7 C) (Oral)  Resp 20  SpO2 100%  LMP 03/03/2015 Physical Exam  Constitutional: She is oriented to person, place, and time. She appears well-developed and well-nourished.  HENT:  Head: Normocephalic and atraumatic.  Eyes: Conjunctivae and EOM are normal. Pupils are equal, round, and reactive to light.  Neck: Normal range of motion. Neck supple.  Cardiovascular: Normal rate and regular rhythm.  Exam reveals no gallop and no friction rub.   No murmur heard. Pulmonary/Chest: Effort normal and breath sounds normal. No respiratory distress. She has no wheezes. She has no rales. She exhibits no tenderness.  CTAB  Abdominal: Soft. Bowel sounds are normal. She exhibits no distension and no mass. There is no tenderness. There is no rebound and no guarding.  Musculoskeletal: Normal range of motion. She exhibits no edema or tenderness.  Neurological: She is alert and oriented to person, place, and time.  Skin: Skin is warm and dry.  Psychiatric: She has a normal mood and affect. Her behavior is normal. Judgment and thought content normal.  Nursing note and vitals reviewed.   ED Course  Procedures (including critical care time)   MDM   Final diagnoses:  Wheezing    Patient with wheezing, following being exposed to some dust. Wheezing and chest tightness resolved after nebulizer treatment. She is clear to auscultation now. Her O2 sats ration is 100%. She is well-appearing. Will discharged home.    Montine Circle, PA-C 04/27/15 2347  Tanna Furry, MD 05/01/15 [phone]

## 2015-04-27 NOTE — Discharge Instructions (Signed)
Bronchospasm, Adult A bronchospasm is when the tubes that carry air in and out of your lungs (airways) spasm or tighten. During a bronchospasm it is hard to breathe. This is because the airways get smaller. A bronchospasm can be triggered by:  Allergies. These may be to animals, pollen, food, or mold.  Infection. This is a common cause of bronchospasm.  Exercise.  Irritants. These include pollution, cigarette smoke, strong odors, aerosol sprays, and paint fumes.  Weather changes.  Stress.  Being emotional. HOME CARE   Always have a plan for getting help. Know when to call your doctor and local emergency services (911 in the [place] Know where you can get emergency care.  Only take medicines as told by your doctor.  If you were prescribed an inhaler or nebulizer machine, ask your doctor how to use it correctly. Always use a spacer with your inhaler if you were given one.  Stay calm during an attack. Try to relax and breathe more slowly.  Control your home environment:  Change your heating and air conditioning filter at least once a month.  Limit your use of fireplaces and wood stoves.  Do not  smoke. Do not  allow smoking in your home.  Avoid perfumes and fragrances.  Get rid of pests (such as roaches and mice) and their droppings.  Throw away plants if you see mold on them.  Keep your house clean and dust free.  Replace carpet with wood, tile, or vinyl flooring. Carpet can trap dander and dust.  Use allergy-proof pillows, mattress covers, and box spring covers.  Wash bed sheets and blankets every week in hot water. Dry them in a dryer.  Use blankets that are made of polyester or cotton.  Wash hands frequently. GET HELP IF:  You have muscle aches.  You have chest pain.  The thick spit you spit or cough up (sputum) changes from clear or white to yellow, green, gray, or bloody.  The thick spit you spit or cough up gets thicker.  There are problems that may be  related to the medicine you are given such as:  A rash.  Itching.  Swelling.  Trouble breathing. GET HELP RIGHT AWAY IF:  You feel you cannot breathe or catch your breath.  You cannot stop coughing.  Your treatment is not helping you breathe better.  You have very bad chest pain. MAKE SURE YOU:   Understand these instructions.  Will watch your condition.  Will get help right away if you are not doing well or get worse.   This information is not intended to replace advice given to you by your health care provider. Make sure you discuss any questions you have with your health care provider.   Document Released: 01/25/2009 Document Revised: 04/20/2014 Document Reviewed: 09/20/2012 Elsevier Interactive Patient Education 2016 Elsevier Inc.  

## 2015-04-27 NOTE — ED Notes (Signed)
Bed: [id number] Expected date:  Expected time:  Means of arrival:  Comments: EMS 44y/oF SOB

## 2015-05-24 ENCOUNTER — Emergency Department (HOSPITAL_COMMUNITY)
Admission: EM | Admit: 2015-05-24 | Discharge: 2015-05-24 | Disposition: A | Discharge status: Home / Self Care | Payer: Medicaid Other | Attending: Emergency Medicine | Admitting: Emergency Medicine

## 2015-05-24 ENCOUNTER — Encounter (HOSPITAL_COMMUNITY): Payer: Self-pay | Admitting: Oncology

## 2015-05-24 DIAGNOSIS — X58XXXA Exposure to other specified factors, initial encounter: Secondary | ICD-10-CM | POA: Diagnosis not present

## 2015-05-24 DIAGNOSIS — Y998 Other external cause status: Secondary | ICD-10-CM | POA: Diagnosis not present

## 2015-05-24 DIAGNOSIS — Z880 Allergy status to penicillin: Secondary | ICD-10-CM | POA: Diagnosis not present

## 2015-05-24 DIAGNOSIS — Y9289 Other specified places as the place of occurrence of the external cause: Secondary | ICD-10-CM | POA: Insufficient documentation

## 2015-05-24 DIAGNOSIS — K219 Gastro-esophageal reflux disease without esophagitis: Secondary | ICD-10-CM | POA: Insufficient documentation

## 2015-05-24 DIAGNOSIS — Z79899 Other long term (current) drug therapy: Secondary | ICD-10-CM | POA: Diagnosis not present

## 2015-05-24 DIAGNOSIS — Z91040 Latex allergy status: Secondary | ICD-10-CM | POA: Insufficient documentation

## 2015-05-24 DIAGNOSIS — J45909 Unspecified asthma, uncomplicated: Secondary | ICD-10-CM | POA: Diagnosis not present

## 2015-05-24 DIAGNOSIS — M5432 Sciatica, left side: Secondary | ICD-10-CM | POA: Insufficient documentation

## 2015-05-24 DIAGNOSIS — Z7951 Long term (current) use of inhaled steroids: Secondary | ICD-10-CM | POA: Insufficient documentation

## 2015-05-24 DIAGNOSIS — Z791 Long term (current) use of non-steroidal anti-inflammatories (NSAID): Secondary | ICD-10-CM | POA: Diagnosis not present

## 2015-05-24 DIAGNOSIS — M1990 Unspecified osteoarthritis, unspecified site: Secondary | ICD-10-CM | POA: Diagnosis not present

## 2015-05-24 DIAGNOSIS — Y9301 Activity, walking, marching and hiking: Secondary | ICD-10-CM | POA: Diagnosis not present

## 2015-05-24 DIAGNOSIS — S79921A Unspecified injury of right thigh, initial encounter: Secondary | ICD-10-CM | POA: Insufficient documentation

## 2015-05-24 DIAGNOSIS — I10 Essential (primary) hypertension: Secondary | ICD-10-CM | POA: Insufficient documentation

## 2015-05-24 DIAGNOSIS — S3992XA Unspecified injury of lower back, initial encounter: Secondary | ICD-10-CM | POA: Diagnosis present

## 2015-05-24 DIAGNOSIS — M5431 Sciatica, right side: Secondary | ICD-10-CM

## 2015-05-24 MED ORDER — PREDNISONE 20 MG PO TABS: ORAL_TABLET | ORAL | Status: DC

## 2015-05-24 MED ORDER — PREDNISONE 20 MG PO TABS
60.0000 mg | ORAL_TABLET | Freq: Once | ORAL | Status: AC
  Administered 2015-05-24: 60 mg via ORAL
  Filled 2015-05-24: qty 3

## 2015-05-24 NOTE — ED Provider Notes (Signed)
CSN: [id number]     Arrival date & time 05/24/15  1939 History  By signing my name below, I, Jolayne Panther, attest that this documentation has been prepared under the direction and in the presence of Junius Creamer, NP. Electronically Signed: Jolayne Panther, Scribe. 05/24/2015. 9:05 PM.   Chief Complaint  Patient presents with  . Back Pain   The history is provided by the patient. No language interpreter was used.   HPI Comments: Margaret Owens is a 45 y.o. female with a hx of sciatica and lower back spasms and spasms in her lateral right thigh, who presents to the Emergency Department complaining of sudden onset, 10/10, throbbing back pain and lateral right thigh pain which began this morning while she was walking to class and felt something "twist" in her right thigh. Pt normally takes a muscle relaxer three times a day which she has taken twice today so far and she takes 800 mg ibuprofen. She reports that she also received an injection of steroids three weeks ago, that only lasted 2 weeks,and has also taken prednisone by mouth in the past for treatment of her pain.   Past Medical History  Diagnosis Date  . Arthritis   . Asthma   . Bursitis   . GERD (gastroesophageal reflux disease)   . Hypertension   . Sciatica   . Muscle spasms of both lower extremities    Past Surgical History  Procedure Laterality Date  . Colonoscopy     Family History  Problem Relation Age of Onset  . Osteoarthritis Mother   . Cancer Mother    Social History  Substance Use Topics  . Smoking status: Never Smoker   . Smokeless tobacco: Never Used  . Alcohol Use: No   OB History    No data available     Review of Systems  Respiratory: Negative for cough and shortness of breath.   Gastrointestinal: Negative for diarrhea and constipation.  Genitourinary: Negative for dysuria, urgency and decreased urine volume.  Musculoskeletal: Positive for myalgias and arthralgias.       Lower  back Lateral right thigh  Neurological: Negative for weakness.  All other systems reviewed and are negative.  Allergies  Latex and Penicillins  Home Medications   Prior to Admission medications   Medication Sig Start Date End Date Taking? Authorizing Provider  albuterol (PROVENTIL HFA;VENTOLIN HFA) 108 (90 BASE) MCG/ACT inhaler Inhale 2 puffs into the lungs every 6 (six) hours as needed for wheezing (wheezing).     Historical Provider, MD  budesonide-formoterol (SYMBICORT) 160-4.5 MCG/ACT inhaler Inhale 2 puffs into the lungs 2 (two) times daily as needed (shortness of breath.).     Historical Provider, MD  cetirizine (ZYRTEC) 10 MG tablet Take 10 mg by mouth daily.    Historical Provider, MD  citalopram (CELEXA) 10 MG tablet Take 10 mg by mouth daily.    Historical Provider, MD  desonide (DESOWEN) 0.05 % cream Apply 1 application topically 2 (two) times daily.    Historical Provider, MD  diclofenac sodium (VOLTAREN) 1 % GEL Apply 2 g topically 4 (four) times daily as needed (apply to leg).     Historical Provider, MD  doxycycline (VIBRAMYCIN) 100 MG capsule Take 1 capsule (100 mg total) by mouth 2 (two) times daily. Patient not taking: Reported on 04/27/2015 04/01/15   Marella Chimes, PA-C  fluticasone [facility]) 50 MCG/ACT nasal spray Place 2 sprays into both nostrils daily.    Historical Provider, MD  ibuprofen (  ADVIL,MOTRIN) 800 MG tablet Take 800 mg by mouth every 8 (eight) hours as needed.    Historical Provider, MD  meloxicam (MOBIC) 15 MG tablet Take 15 mg by mouth daily.    Historical Provider, MD  methocarbamol (ROBAXIN) 500 MG tablet Take 1 tablet (500 mg total) by mouth 2 (two) times daily. Patient not taking: Reported on 04/27/2015 03/05/15   Jarrett Soho Muthersbaugh, PA-C  metroNIDAZOLE (FLAGYL) 500 MG tablet Take 1 tablet (500 mg total) by mouth 2 (two) times daily. Patient not taking: Reported on 04/27/2015 01/06/15   [name], MD  naproxen (NAPROSYN) 500 MG tablet Take 1  tablet (500 mg total) by mouth 2 (two) times daily with a meal. Patient not taking: Reported on 04/27/2015 03/05/15   Jarrett Soho Muthersbaugh, PA-C  pantoprazole (PROTONIX) 40 MG tablet Take 40 mg by mouth 2 (two) times daily.     Historical Provider, MD  phentermine (ADIPEX-P) 37.5 MG tablet Take 37.5 mg by mouth daily before breakfast.    Historical Provider, MD  predniSONE (DELTASONE) 20 MG tablet 3 Tabs PO Days 1-3, then 2 tabs PO Days 4-6, then 1 tab PO Day 7-9, then Half Tab PO Day 10-12 05/24/15   Junius Creamer, NP  tiZANidine (ZANAFLEX) 4 MG tablet Take 4 mg by mouth at bedtime as needed for muscle spasms (muscle spasms).    Historical Provider, MD  traZODone (DESYREL) 50 MG tablet Take 50 mg by mouth at bedtime as needed for sleep (sleep).     Historical Provider, MD  Vitamin D, Ergocalciferol, (DRISDOL) 50000 UNITS CAPS capsule Take 50,000 Units by mouth every 7 (seven) days.    Historical Provider, MD   BP 130/80 mmHg  Pulse 78  Temp(Src) 98.1 F (36.7 C) (Oral)  Resp 20  Ht 5\' 9"  (1.753 m)  Wt 111.131 kg  BMI 36.16 kg/m2  SpO2 100%  LMP 05/08/2015 Physical Exam  Constitutional: She is oriented to person, place, and time. She appears well-developed and well-nourished. No distress.  HENT:  Head: Normocephalic and atraumatic.  Eyes: Pupils are equal, round, and reactive to light.  Neck: Normal range of motion.  Cardiovascular: Normal rate, regular rhythm and normal heart sounds.   Pulmonary/Chest: Effort normal and breath sounds normal.  Abdominal: Soft. She exhibits no distension. There is no tenderness.  Musculoskeletal: Normal range of motion. She exhibits tenderness. She exhibits no edema.       Back:  Neurological: She is alert and oriented to person, place, and time.  Skin: Skin is warm and dry.  Psychiatric: She has a normal mood and affect.  Nursing note and vitals reviewed.   ED Course  Procedures  DIAGNOSTIC STUDIES:    Oxygen Saturation is 100% on RA, normal by my  interpretation.   COORDINATION OF CARE:  9:06 PM Will administer pt course of steroids. Discussed treatment plan with pt at bedside and pt agreed to plan.  Will treat with PO Prednisone patient cautioned to decrease use of Ibuprofen while on Prednisone.  MDM   Final diagnoses:  Sciatica neuralgia, right   I personally performed the services described in this documentation, which was scribed in my presence. The recorded information has been reviewed and is accurate.   Junius Creamer, NP 05/24/15 2121  Lacretia Leigh, MD 05/24/15 [phone]

## 2015-05-24 NOTE — Discharge Instructions (Signed)
You have been given a prescription for Prednisone that this as directed until the entire course of medication is complete   Cut down on the Ibuprofen as these are both antiinflammatories  Follow up with  Your PCP

## 2015-05-24 NOTE — ED Notes (Addendum)
Pt stated "I had the spinal injection about 3 wks ago.  I take tizanidine and I took one today."

## 2015-05-24 NOTE — ED Notes (Signed)
Per pt as she was walking to class when she felt something "twist" in her right thigh.  Pt is ambulatory is triage.  Rates pain 10/10, throbbing in nature.

## 2015-08-08 ENCOUNTER — Emergency Department (HOSPITAL_COMMUNITY)
Admission: EM | Admit: 2015-08-08 | Discharge: 2015-08-08 | Disposition: A | Discharge status: Home / Self Care | Payer: Medicaid Other | Attending: Emergency Medicine | Admitting: Emergency Medicine

## 2015-08-08 ENCOUNTER — Encounter (HOSPITAL_COMMUNITY): Payer: Self-pay | Admitting: Emergency Medicine

## 2015-08-08 ENCOUNTER — Emergency Department (HOSPITAL_COMMUNITY): Payer: Medicaid Other

## 2015-08-08 DIAGNOSIS — Z791 Long term (current) use of non-steroidal anti-inflammatories (NSAID): Secondary | ICD-10-CM | POA: Insufficient documentation

## 2015-08-08 DIAGNOSIS — J189 Pneumonia, unspecified organism: Secondary | ICD-10-CM

## 2015-08-08 DIAGNOSIS — I10 Essential (primary) hypertension: Secondary | ICD-10-CM | POA: Diagnosis not present

## 2015-08-08 DIAGNOSIS — R000 Tachycardia, unspecified: Secondary | ICD-10-CM | POA: Diagnosis not present

## 2015-08-08 DIAGNOSIS — J159 Unspecified bacterial pneumonia: Secondary | ICD-10-CM | POA: Diagnosis not present

## 2015-08-08 DIAGNOSIS — Z7951 Long term (current) use of inhaled steroids: Secondary | ICD-10-CM | POA: Insufficient documentation

## 2015-08-08 DIAGNOSIS — Z880 Allergy status to penicillin: Secondary | ICD-10-CM | POA: Diagnosis not present

## 2015-08-08 DIAGNOSIS — Z3202 Encounter for pregnancy test, result negative: Secondary | ICD-10-CM | POA: Diagnosis not present

## 2015-08-08 DIAGNOSIS — M1990 Unspecified osteoarthritis, unspecified site: Secondary | ICD-10-CM | POA: Diagnosis not present

## 2015-08-08 DIAGNOSIS — K219 Gastro-esophageal reflux disease without esophagitis: Secondary | ICD-10-CM | POA: Diagnosis not present

## 2015-08-08 DIAGNOSIS — Z79899 Other long term (current) drug therapy: Secondary | ICD-10-CM | POA: Diagnosis not present

## 2015-08-08 DIAGNOSIS — J45901 Unspecified asthma with (acute) exacerbation: Secondary | ICD-10-CM | POA: Diagnosis not present

## 2015-08-08 DIAGNOSIS — Z91040 Latex allergy status: Secondary | ICD-10-CM | POA: Insufficient documentation

## 2015-08-08 DIAGNOSIS — R05 Cough: Secondary | ICD-10-CM | POA: Diagnosis present

## 2015-08-08 LAB — URINALYSIS, ROUTINE W REFLEX MICROSCOPIC
Glucose, UA: NEGATIVE mg/dL
Ketones, ur: 15 mg/dL — AB
NITRITE: NEGATIVE
PROTEIN: 30 mg/dL — AB
Specific Gravity, Urine: 1.036 — ABNORMAL HIGH (ref 1.005–1.030)
pH: 5 (ref 5.0–8.0)

## 2015-08-08 LAB — URINE MICROSCOPIC-ADD ON

## 2015-08-08 LAB — I-STAT TROPONIN, ED: Troponin i, poc: 0 ng/mL (ref 0.00–0.08)

## 2015-08-08 LAB — CBC
HCT: 35.2 % — ABNORMAL LOW (ref 36.0–46.0)
Hemoglobin: 12.1 g/dL (ref 12.0–15.0)
MCH: 28.1 pg (ref 26.0–34.0)
MCHC: 34.4 g/dL (ref 30.0–36.0)
MCV: 81.9 fL (ref 78.0–100.0)
PLATELETS: 400 10*3/uL (ref 150–400)
RBC: 4.3 MIL/uL (ref 3.87–5.11)
RDW: 13.3 % (ref 11.5–15.5)
WBC: 24.9 10*3/uL — AB (ref 4.0–10.5)

## 2015-08-08 LAB — BASIC METABOLIC PANEL
Anion gap: 12 (ref 5–15)
BUN: 10 mg/dL (ref 6–20)
CHLORIDE: 103 mmol/L (ref 101–111)
CO2: 23 mmol/L (ref 22–32)
CREATININE: 0.72 mg/dL (ref 0.44–1.00)
Calcium: 9.1 mg/dL (ref 8.9–10.3)
GFR calc Af Amer: >60 mL/min (ref >60)
GFR calc non Af Amer: >60 mL/min (ref >60)
Glucose, Bld: 103 mg/dL — ABNORMAL HIGH (ref 65–99)
Potassium: 3.8 mmol/L (ref 3.5–5.1)
SODIUM: 138 mmol/L (ref 135–145)

## 2015-08-08 LAB — CBG MONITORING, ED: Glucose-Capillary: 93 mg/dL (ref 65–99)

## 2015-08-08 LAB — POC URINE PREG, ED: Preg Test, Ur: NEGATIVE

## 2015-08-08 MED ORDER — AMOXICILLIN-POT CLAVULANATE 875-125 MG PO TABS: 1.0000 | ORAL_TABLET | Freq: Two times a day (BID) | ORAL | Status: DC

## 2015-08-08 MED ORDER — HYDROCODONE-HOMATROPINE 5-1.5 MG/5ML PO SYRP
5.0000 mL | ORAL_SOLUTION | Freq: Once | ORAL | Status: AC
  Administered 2015-08-08: 5 mL via ORAL
  Filled 2015-08-08: qty 5

## 2015-08-08 MED ORDER — SODIUM CHLORIDE 0.9 % IV BOLUS (SEPSIS)
1000.0000 mL | Freq: Once | INTRAVENOUS | Status: AC
  Administered 2015-08-08: 1000 mL via INTRAVENOUS

## 2015-08-08 MED ORDER — HYDROCODONE-HOMATROPINE 5-1.5 MG/5ML PO SYRP: 5.0000 mL | ORAL_SOLUTION | Freq: Four times a day (QID) | ORAL | Status: DC | PRN

## 2015-08-08 MED ORDER — ONDANSETRON HCL 4 MG/2ML IJ SOLN
4.0000 mg | Freq: Once | INTRAMUSCULAR | Status: AC
  Administered 2015-08-08: 4 mg via INTRAVENOUS
  Filled 2015-08-08: qty 2

## 2015-08-08 MED ORDER — AMOXICILLIN-POT CLAVULANATE 875-125 MG PO TABS
1.0000 | ORAL_TABLET | Freq: Once | ORAL | Status: AC
  Administered 2015-08-08: 1 via ORAL
  Filled 2015-08-08: qty 1

## 2015-08-08 MED ORDER — IPRATROPIUM-ALBUTEROL 0.5-2.5 (3) MG/3ML IN SOLN
3.0000 mL | Freq: Once | RESPIRATORY_TRACT | Status: AC
  Administered 2015-08-08: 3 mL via RESPIRATORY_TRACT
  Filled 2015-08-08: qty 3

## 2015-08-08 NOTE — ED Notes (Signed)
Pt states she's feeling SOB and having generalized aching, also c/o cough, runny nose, sore throat, weakness x 1 week.

## 2015-08-08 NOTE — ED Notes (Signed)
Below orders not completed by EW. 

## 2015-08-08 NOTE — Discharge Instructions (Signed)

## 2015-08-08 NOTE — ED Provider Notes (Signed)
CSN: [id number]     Arrival date & time 08/08/15  1539 History   First MD Initiated Contact with Patient 08/08/15 1713     Chief Complaint  Patient presents with  . Shortness of Breath  . Generalized Body Aches  . Cough     (Consider location/radiation/quality/duration/timing/severity/associated sxs/prior Treatment) HPI Comments: Patient with past medical history of asthma, hypertension, and GERD presents emergency department with chief complaint of cough, shortness of breath, and generalized body aches. She states that she has had worsening symptoms for the past 4-5 days. She reports some associated nausea and vomiting. She also reports productive cough. There are no modifying factors. She has not tried taking anything to alleviate her symptoms. She denies any associated fever.  The history is provided by the patient. No language interpreter was used.    Past Medical History  Diagnosis Date  . Arthritis   . Asthma   . Bursitis   . GERD (gastroesophageal reflux disease)   . Hypertension   . Sciatica   . Muscle spasms of both lower extremities    Past Surgical History  Procedure Laterality Date  . Colonoscopy     Family History  Problem Relation Age of Onset  . Osteoarthritis Mother   . Cancer Mother    Social History  Substance Use Topics  . Smoking status: Never Smoker   . Smokeless tobacco: Never Used  . Alcohol Use: No   OB History    No data available     Review of Systems  Constitutional: Positive for fever and chills.  HENT: Positive for postnasal drip, rhinorrhea, sinus pressure, sneezing and sore throat.   Respiratory: Positive for cough. Negative for shortness of breath.   Cardiovascular: Negative for chest pain.  Gastrointestinal: Positive for nausea and vomiting. Negative for abdominal pain, diarrhea and constipation.  Genitourinary: Negative for dysuria.  All other systems reviewed and are negative.     Allergies  Latex and Penicillins  Home  Medications   Prior to Admission medications   Medication Sig Start Date End Date Taking? Authorizing Provider  budesonide-formoterol (SYMBICORT) 160-4.5 MCG/ACT inhaler Inhale 2 puffs into the lungs 2 (two) times daily as needed (shortness of breath.).    Yes Historical Provider, MD  cetirizine (ZYRTEC) 10 MG tablet Take 10 mg by mouth daily.   Yes Historical Provider, MD  citalopram (CELEXA) 10 MG tablet Take 10 mg by mouth daily.   Yes Historical Provider, MD  desonide (DESOWEN) 0.05 % cream Apply 1 application topically 2 (two) times daily.   Yes Historical Provider, MD  diclofenac sodium (VOLTAREN) 1 % GEL Apply 2 g topically 4 (four) times daily as needed (apply to leg).    Yes Historical Provider, MD  fluticasone (FLONASE) 50 MCG/ACT nasal spray Place 2 sprays into both nostrils daily.   Yes Historical Provider, MD  ibuprofen (ADVIL,MOTRIN) 800 MG tablet Take 800 mg by mouth every 8 (eight) hours as needed.   Yes Historical Provider, MD  meloxicam (MOBIC) 15 MG tablet Take 15 mg by mouth daily.   Yes Historical Provider, MD  naproxen sodium (ANAPROX) 220 MG tablet Take 220 mg by mouth 2 (two) times daily as needed (body aches.).   Yes Historical Provider, MD  pantoprazole (PROTONIX) 40 MG tablet Take 40 mg by mouth 2 (two) times daily.    Yes Historical Provider, MD  phentermine (ADIPEX-P) 37.5 MG tablet Take 37.5 mg by mouth daily before breakfast.   Yes Historical Provider, MD  tiZANidine (ZANAFLEX)  4 MG tablet Take 4 mg by mouth at bedtime as needed for muscle spasms (muscle spasms).   Yes Historical Provider, MD  traZODone (DESYREL) 50 MG tablet Take 50 mg by mouth at bedtime as needed for sleep (sleep).    Yes Historical Provider, MD  Vitamin D, Ergocalciferol, (DRISDOL) 50000 UNITS CAPS capsule Take 50,000 Units by mouth every 7 (seven) days.   Yes Historical Provider, MD  albuterol (PROVENTIL HFA;VENTOLIN HFA) 108 (90 BASE) MCG/ACT inhaler Inhale 2 puffs into the lungs every 6 (six)  hours as needed for wheezing (wheezing).     Historical Provider, MD  doxycycline (VIBRAMYCIN) 100 MG capsule Take 1 capsule (100 mg total) by mouth 2 (two) times daily. Patient not taking: Reported on 04/27/2015 04/01/15   Marella Chimes, PA-C  methocarbamol (ROBAXIN) 500 MG tablet Take 1 tablet (500 mg total) by mouth 2 (two) times daily. Patient not taking: Reported on 04/27/2015 03/05/15   Jarrett Soho Muthersbaugh, PA-C  metroNIDAZOLE (FLAGYL) 500 MG tablet Take 1 tablet (500 mg total) by mouth 2 (two) times daily. Patient not taking: Reported on 04/27/2015 01/06/15   [name], MD  naproxen (NAPROSYN) 500 MG tablet Take 1 tablet (500 mg total) by mouth 2 (two) times daily with a meal. Patient not taking: Reported on 04/27/2015 03/05/15   Jarrett Soho Muthersbaugh, PA-C  predniSONE (DELTASONE) 20 MG tablet 3 Tabs PO Days 1-3, then 2 tabs PO Days 4-6, then 1 tab PO Day 7-9, then Half Tab PO Day 10-12 Patient not taking: Reported on 08/08/2015 05/24/15   Junius Creamer, NP   BP 115/63 mmHg  Pulse 110  Temp(Src) 99 F (37.2 C) (Oral)  Resp 26  SpO2 100% Physical Exam  Constitutional: She appears well-developed and well-nourished. No distress.  HENT:  Head: Normocephalic.  Right Ear: External ear normal.  Left Ear: External ear normal.  Mildly erythematous, no tonsillar exudate, no abscess, no stridor, uvula is midline  TMs clear bilaterally  Eyes: Conjunctivae and EOM are normal. Pupils are equal, round, and reactive to light.  Neck: Normal range of motion. Neck supple.  Cardiovascular: Regular rhythm and normal heart sounds.  Exam reveals no gallop and no friction rub.   No murmur heard. tachycardic  Pulmonary/Chest: Effort normal and breath sounds normal. No stridor. No respiratory distress. She has no wheezes. She has no rales. She exhibits no tenderness.  Diminished lung sounds, no wheezes  Abdominal: Soft. Bowel sounds are normal. She exhibits no distension and no mass. There is no  tenderness. There is no rebound and no guarding.  Musculoskeletal: Normal range of motion. She exhibits no tenderness.  Neurological: She is alert.  Skin: Skin is warm and dry. No rash noted. She is not diaphoretic.  Psychiatric: She has a normal mood and affect. Her behavior is normal. Judgment and thought content normal.  Nursing note and vitals reviewed.   ED Course  Procedures (including critical care time) Results for orders placed or performed during the hospital encounter of [id number]  Basic metabolic panel  Result Value Ref Range   Sodium 138 135 - 145 mmol/L   Potassium 3.8 3.5 - 5.1 mmol/L   Chloride 103 101 - 111 mmol/L   CO2 23 22 - 32 mmol/L   Glucose, Bld 103 (H) 65 - 99 mg/dL   BUN 10 6 - 20 mg/dL   Creatinine, Ser 0.72 0.44 - 1.00 mg/dL   Calcium 9.1 8.9 - 10.3 mg/dL   GFR calc non Af Amer >60 >60 mL/min  GFR calc Af Amer >60 >60 mL/min   Anion gap 12 5 - 15  CBC  Result Value Ref Range   WBC 24.9 (H) 4.0 - 10.5 K/uL   RBC 4.30 3.87 - 5.11 MIL/uL   Hemoglobin 12.1 12.0 - 15.0 g/dL   HCT 35.2 (L) 36.0 - 46.0 %   MCV 81.9 78.0 - 100.0 fL   MCH 28.1 26.0 - 34.0 pg   MCHC 34.4 30.0 - 36.0 g/dL   RDW 13.3 11.5 - 15.5 %   Platelets 400 150 - 400 K/uL  Urinalysis, Routine w reflex microscopic (not at [facility])  Result Value Ref Range   Color, Urine AMBER (A) YELLOW   APPearance CLOUDY (A) CLEAR   Specific Gravity, Urine 1.036 (H) 1.005 - 1.030   pH 5.0 5.0 - 8.0   Glucose, UA NEGATIVE NEGATIVE mg/dL   Hgb urine dipstick SMALL (A) NEGATIVE   Bilirubin Urine SMALL (A) NEGATIVE   Ketones, ur 15 (A) NEGATIVE mg/dL   Protein, ur 30 (A) NEGATIVE mg/dL   Nitrite NEGATIVE NEGATIVE   Leukocytes, UA TRACE (A) NEGATIVE  Urine microscopic-add on  Result Value Ref Range   Squamous Epithelial / LPF 0-5 (A) NONE SEEN   WBC, UA 0-5 0 - 5 WBC/hpf   RBC / HPF 6-30 0 - 5 RBC/hpf   Bacteria, UA RARE (A) NONE SEEN   Urine-Other MUCOUS PRESENT   CBG monitoring, ED  Result Value  Ref Range   Glucose-Capillary 93 65 - 99 mg/dL  POC urine preg, ED (not at [facility])  Result Value Ref Range   Preg Test, Ur NEGATIVE NEGATIVE  I-Stat Troponin, ED (not at [facility])  Result Value Ref Range   Troponin i, poc 0.00 0.00 - 0.08 ng/mL   Comment 3           Dg Chest 2 View  08/08/2015  CLINICAL DATA:  Cough and chest pain for 2 days.  Fever. EXAM: CHEST  2 VIEW COMPARISON:  04/30/2013 FINDINGS: Left lower lobe consolidation in the retrocardiac position especially involving the posterior basal segment. Linear subsegmental atelectasis at the right lung base. Cardiac and mediastinal margins appear normal. IMPRESSION: 1. Consolidation in the left lower lobe. In this clinical context the appearance favors pneumonia. Followup PA and lateral chest X-ray is recommended in 3-4 weeks following trial of antibiotic therapy to ensure resolution and exclude underlying malignancy. Electronically Signed   By: Van Clines M.D.   On: 08/08/2015 18:30    I have personally reviewed and evaluated these images and lab results as part of my medical decision-making.    MDM   Final diagnoses:  Community acquired pneumonia    Patient with cough, SOB.  Hx of asthma. Generalized body aches.  WBC is 24.9.  Will check CXR, give fluids and reassess.  Chest x-ray consistent with focal pneumonia. Will start patient on Augmentin. Her vital signs are stable. She is not in any apparent distress. Will ambulate with pulse ox. Plan for discharge to home on Augmentin. Recheck with primary care provider.  Patient ambulates maintaining greater than 95% pulse oxygenation. Negative to be discharged home. Strict return precautions given. Patient is stable and ready for discharge.    Montine Circle, PA-C 08/08/15 1952

## 2015-08-08 NOTE — ED Notes (Signed)
Pt ambulated without assistance Pule rate was at 110 and O2 stayed between 96-100

## 2015-09-26 ENCOUNTER — Ambulatory Visit: Payer: Medicaid Other | Attending: Internal Medicine

## 2015-09-26 DIAGNOSIS — M545 Low back pain, unspecified: Secondary | ICD-10-CM

## 2015-09-26 DIAGNOSIS — R293 Abnormal posture: Secondary | ICD-10-CM | POA: Insufficient documentation

## 2015-09-26 DIAGNOSIS — M6281 Muscle weakness (generalized): Secondary | ICD-10-CM

## 2015-09-26 NOTE — Patient Instructions (Addendum)

## 2015-09-26 NOTE — Therapy (Signed)
[place], [place], [address] Phone: [phone]   Fax:  [phone]  Physical Therapy Evaluation/Discharge  Patient Details  Name: Margaret Owens MRN: [id number] Date of Birth: 04/18/1970 Referring Provider: Nolene Ebbs, MD  Encounter Date: 09/26/2015      PT End of Session - 09/26/15 1322    Visit Number 1   Number of Visits 1   Authorization Type Medicaid   PT Start Time 1228   PT Stop Time 1320   PT Time Calculation (min) 52 min   Activity Tolerance Patient tolerated treatment well   Behavior During Therapy [facility] for tasks assessed/performed      Past Medical History  Diagnosis Date  . Arthritis   . Asthma   . Bursitis   . GERD (gastroesophageal reflux disease)   . Hypertension   . Sciatica   . Muscle spasms of both lower extremities     Past Surgical History  Procedure Laterality Date  . Colonoscopy      There were no vitals filed for this visit.       Subjective Assessment - 09/26/15 1231    Subjective She reports back pain with DDD and sciatica. She was seen in past and now has TENS unit.  She reports she does yoga in community.   Wants to have some exercises from PT. Does home tasks as able . She walks daily 15-20 min.    Limitations --  She limits her time with activity but limits with fatigue and pain.    Diagnostic tests None lately   Patient Stated Goals Exervcises to improve   Currently in Pain? Yes   Pain Score 8    Pain Location Back   Pain Orientation Lower;Right;Left   Pain Descriptors / Indicators Dull;Sharp  aggravating   Pain Type Chronic pain   Pain Onset More than a month ago   Pain Frequency Constant   Aggravating Factors  Lying down, standing   Pain Relieving Factors medication,  TENS   Multiple Pain Sites No            [facility] PT Assessment - 09/26/15 1240    Assessment   Medical Diagnosis DDD, chronic back pain   Referring Provider Nolene Ebbs, MD   Onset Date/Surgical Date --  3 years, worsening in past year   Next MD Visit 10/2015   Prior Therapy Yes with limited benefit   Precautions   Precautions None   Restrictions   Weight Bearing Restrictions No   Balance Screen   Has the patient fallen in the past 6 months No   Has the patient had a decrease in activity level because of a fear of falling?  No   Is the patient reluctant to leave their home because of a fear of falling?  No   Home Environment   Living Environment Private residence   Living Arrangements Children   Type of Home Apartment   Home Access Level entry   Home Layout One level   Prior Function   Level of Independence Independent   Cognition   Overall Cognitive Status Within Functional Limits for tasks assessed   Posture/Postural Control   Posture Comments Head tilted RT and RT shoulder lower than LT   pedulous abdomen   ROM / Strength   AROM / PROM / Strength AROM;Strength   AROM   AROM Assessment Site Lumbar   Lumbar Flexion 60   Lumbar Extension 10   Lumbar -  Right Side Bend 10   Lumbar - Left Side Bend 10   Strength   Overall Strength Comments Both quads and ahmstrings 4=/5 ankle 4+/5 , hips 4/5                           PT Education - 09/26/15 1321    Education provided Yes   Education Details Medicaid limits and HEP   Person(s) Educated Patient   Methods Explanation;Demonstration;Tactile cues;Verbal cues;Handout   Comprehension Returned demonstration;Verbalized understanding                    Plan - 09/26/15 1322    Clinical Impression Statement [name] presents with chronic back pain , DDD of 3 years time with back pain weakness and abnormal posture limitng tolerance to activity. She was She was encouraged to be consistent with exercise to see results in 6-8 weeks.  She did not want to be self pay without source of income.  instructed in HEP and limiting bending and stretching and improtance of  posture.    PT Frequency One time visit   PT Treatment/Interventions Patient/family education;Therapeutic exercise   PT Next Visit Plan N   PT Home Exercise Plan Eval and HEP only    Consulted and Agree with Plan of Care Patient      Patient will benefit from skilled therapeutic intervention in order to improve the following deficits and impairments:  Decreased activity tolerance, Decreased strength, Postural dysfunction, Pain, Increased muscle spasms, Decreased endurance, Decreased range of motion  Visit Diagnosis: Bilateral low back pain without sciatica - Plan: PT plan of care cert/re-cert  Abnormal posture - Plan: PT plan of care cert/re-cert  Muscle weakness (generalized) - Plan: PT plan of care cert/re-cert     Problem List There are no active problems to display for this patient.   Darrel Hoover PT  09/26/2015, 1:27 PM  [facility] [address] [place], [place], [address] Phone: [phone]   Fax:  [phone]  Name: Margaret Owens MRN: [id number] Date of Birth: 10-28-1970

## 2015-10-23 NOTE — ED Provider Notes (Signed)
Medical screening examination/treatment/procedure(s) were performed by non-physician practitioner and as supervising physician I was immediately available for consultation/collaboration.   EKG Interpretation   Date/Time:  Thursday August 08 2015 15:51:29 EDT Ventricular Rate:  101 PR Interval:  138 QRS Duration: 65 QT Interval:  418 QTC Calculation: 542 R Axis:   79 Text Interpretation:  Sinus tachycardia Right atrial enlargement  Borderline repolarization abnormality Prolonged QT interval No significant  change since last tracing Confirmed by YAO  MD, DAVID ([id number]) on 08/10/2015  1:27:00 PM       Isla Pence, MD 10/23/15 1402

## 2016-01-03 ENCOUNTER — Other Ambulatory Visit: Payer: Self-pay | Admitting: Internal Medicine

## 2016-01-03 DIAGNOSIS — Z1231 Encounter for screening mammogram for malignant neoplasm of breast: Secondary | ICD-10-CM

## 2016-01-06 ENCOUNTER — Encounter: Payer: Self-pay | Admitting: *Deleted

## 2016-01-10 ENCOUNTER — Ambulatory Visit
Admission: RE | Admit: 2016-01-10 | Discharge: 2016-01-10 | Disposition: A | Discharge status: Home / Self Care | Payer: Medicaid Other | Source: Ambulatory Visit | Attending: Internal Medicine | Admitting: Internal Medicine

## 2016-01-10 DIAGNOSIS — Z1231 Encounter for screening mammogram for malignant neoplasm of breast: Secondary | ICD-10-CM

## 2016-02-09 ENCOUNTER — Emergency Department (HOSPITAL_COMMUNITY): Payer: Medicaid Other

## 2016-02-09 ENCOUNTER — Encounter (HOSPITAL_COMMUNITY): Payer: Self-pay

## 2016-02-09 ENCOUNTER — Emergency Department (HOSPITAL_COMMUNITY)
Admission: EM | Admit: 2016-02-09 | Discharge: 2016-02-10 | Disposition: A | Discharge status: Home / Self Care | Payer: Medicaid Other | Attending: Emergency Medicine | Admitting: Emergency Medicine

## 2016-02-09 DIAGNOSIS — Z91040 Latex allergy status: Secondary | ICD-10-CM | POA: Insufficient documentation

## 2016-02-09 DIAGNOSIS — I10 Essential (primary) hypertension: Secondary | ICD-10-CM | POA: Insufficient documentation

## 2016-02-09 DIAGNOSIS — J040 Acute laryngitis: Secondary | ICD-10-CM | POA: Diagnosis not present

## 2016-02-09 DIAGNOSIS — B9789 Other viral agents as the cause of diseases classified elsewhere: Secondary | ICD-10-CM

## 2016-02-09 DIAGNOSIS — J45909 Unspecified asthma, uncomplicated: Secondary | ICD-10-CM | POA: Insufficient documentation

## 2016-02-09 DIAGNOSIS — R05 Cough: Secondary | ICD-10-CM | POA: Diagnosis present

## 2016-02-09 MED ORDER — PREDNISONE 20 MG PO TABS: 40.0000 mg | ORAL_TABLET | Freq: Every day | ORAL | 0 refills | Status: DC

## 2016-02-09 MED ORDER — HYDROCODONE-ACETAMINOPHEN 5-325 MG PO TABS: ORAL_TABLET | ORAL | 0 refills | Status: DC

## 2016-02-09 MED ORDER — ONDANSETRON 8 MG PO TBDP
8.0000 mg | ORAL_TABLET | Freq: Once | ORAL | Status: DC
  Filled 2016-02-09: qty 1

## 2016-02-09 MED ORDER — SODIUM CHLORIDE 0.9 % IV BOLUS (SEPSIS): 30.0000 mL/kg | Freq: Once | INTRAVENOUS | Status: DC

## 2016-02-09 NOTE — Discharge Instructions (Signed)
Your x-ray today does not show any pneumonia.  Please perform vocal rest and do not talk as much as possible over the next 48 hours, drink plenty of water and humidify the air.  Take vicodin for breakthrough pain, do not drink alcohol, drive, care for children or do other critical tasks while taking vicodin.  Please follow with your primary care doctor in the next 2 days for a check-up. They must obtain records for further management.   Do not hesitate to return to the Emergency Department for any new, worsening or concerning symptoms.

## 2016-02-09 NOTE — ED Notes (Signed)
Bed: [id number] Expected date:  Expected time:  Means of arrival:  Comments: 45 yo/F Cough/congestion

## 2016-02-09 NOTE — ED Triage Notes (Signed)
Per EMS- Pt c/o cough with congestion for 3 days. No SOB currently. Hx of pneumonia.

## 2016-02-09 NOTE — ED Provider Notes (Signed)
[facility] DEPT Provider Note   CSN: [id number] Arrival date & time: 02/09/16  2121     History   Chief Complaint Chief Complaint  Patient presents with  . Cough    HPI   Blood pressure 128/70, pulse 72, temperature 98.2 F (36.8 C), temperature source Oral, resp. rate 20, height 5\' 9"  (1.753 m), weight 108.9 kg, SpO2 100 %.  Margaret Owens is a 45 y.o. female complaining of dry cough, pleuritic chest pain, hoarse voice worsening over the course of 3 days. Patient denies fever, chills, nausea, vomiting, shortness of breath. She has history of asthma but has not been using her inhaler more frequently than normal. No change in bowel or bladder habits. She is concerned because she had pneumonia in the past.  Past Medical History:  Diagnosis Date  . Arthritis   . Asthma   . Bursitis   . GERD (gastroesophageal reflux disease)   . Hypertension   . Muscle spasms of both lower extremities   . Sciatica     There are no active problems to display for this patient.   Past Surgical History:  Procedure Laterality Date  . colonoscopy      OB History    No data available       Home Medications    Prior to Admission medications   Medication Sig Start Date End Date Taking? Authorizing Provider  albuterol (PROVENTIL HFA;VENTOLIN HFA) 108 (90 BASE) MCG/ACT inhaler Inhale 2 puffs into the lungs every 6 (six) hours as needed for wheezing (wheezing).    Yes Historical Provider, MD  cetirizine (ZYRTEC) 10 MG tablet Take 10 mg by mouth daily.   Yes Historical Provider, MD  citalopram (CELEXA) 10 MG tablet Take 10 mg by mouth daily.   Yes Historical Provider, MD  desonide (DESOWEN) 0.05 % cream Apply 1 application topically 2 (two) times daily.   Yes Historical Provider, MD  diclofenac sodium (VOLTAREN) 1 % GEL Apply 2 g topically 4 (four) times daily as needed (apply to leg).    Yes Historical Provider, MD  fluticasone (FLONASE) 50 MCG/ACT nasal spray Place 2 sprays into both  nostrils daily.   Yes Historical Provider, MD  LYRICA 25 MG capsule Take 25 mg by mouth 3 (three) times daily as needed for pain. 02/03/16  Yes Historical Provider, MD  meloxicam (MOBIC) 15 MG tablet Take 15 mg by mouth daily.   Yes Historical Provider, MD  naproxen sodium (ANAPROX) 220 MG tablet Take 220 mg by mouth 2 (two) times daily as needed (body aches.). Reported on 09/26/2015   Yes Historical Provider, MD  oxyCODONE-acetaminophen (PERCOCET/ROXICET) 5-325 MG tablet Take 1 tablet by mouth every 4 (four) hours as needed for severe pain.    Yes Historical Provider, MD  pantoprazole (PROTONIX) 40 MG tablet Take 40 mg by mouth 2 (two) times daily.    Yes Historical Provider, MD  phentermine (ADIPEX-P) 37.5 MG tablet Take 37.5 mg by mouth daily before breakfast.   Yes Historical Provider, MD  tiZANidine (ZANAFLEX) 4 MG tablet Take 4 mg by mouth at bedtime as needed for muscle spasms (muscle spasms).   Yes Historical Provider, MD  traZODone (DESYREL) 50 MG tablet Take 50 mg by mouth at bedtime as needed for sleep (sleep).    Yes Historical Provider, MD  Vitamin D, Ergocalciferol, (DRISDOL) 50000 UNITS CAPS capsule Take 50,000 Units by mouth every 7 (seven) days.   Yes Historical Provider, MD  amoxicillin-clavulanate (AUGMENTIN) 875-125 MG tablet Take 1 tablet  by mouth every 12 (twelve) hours. Patient not taking: Reported on 02/09/2016 08/08/15   Montine Circle, PA-C  budesonide-formoterol [facility]) 160-4.5 MCG/ACT inhaler Inhale 2 puffs into the lungs 2 (two) times daily as needed (shortness of breath.).     Historical Provider, MD  HYDROcodone-acetaminophen (NORCO/VICODIN) 5-325 MG tablet Take 1-2 tablets by mouth every 6 hours as needed for pain and/or cough. 02/09/16   Beckem Tomberlin, PA-C  HYDROcodone-homatropine (HYCODAN) 5-1.5 MG/5ML syrup Take 5 mLs by mouth every 6 (six) hours as needed for cough. Patient not taking: Reported on 02/09/2016 08/08/15   Montine Circle, PA-C  predniSONE  (DELTASONE) 20 MG tablet Take 2 tablets (40 mg total) by mouth daily. 02/09/16   Elmyra Ricks Phoenix Riesen, PA-C    Family History Family History  Problem Relation Age of Onset  . Osteoarthritis Mother   . Cancer Mother     Social History Social History  Substance Use Topics  . Smoking status: Never Smoker  . Smokeless tobacco: Never Used  . Alcohol use No     Allergies   Latex and Penicillins   Review of Systems Review of Systems  10 systems reviewed and found to be negative, except as noted in the HPI.   Physical Exam Updated Vital Signs BP 110/58   Pulse 73   Temp 98.3 F (36.8 C) (Oral)   Resp 20   Ht 5\' 9"  (1.753 m)   Wt 108.9 kg   SpO2 100%   BMI 35.44 kg/m   Physical Exam  Constitutional: She is oriented to person, place, and time. She appears well-developed and well-nourished. No distress.  Hoarse voice  HENT:  Head: Normocephalic and atraumatic.  Right Ear: External ear normal.  Left Ear: External ear normal.  Mouth/Throat: Oropharynx is clear and moist. No oropharyngeal exudate.  No drooling or stridor. Posterior pharynx mildly erythematous no significant tonsillar hypertrophy. No exudate. Soft palate rises symmetrically. No TTP or induration under tongue.   No tenderness to palpation of frontal or bilateral maxillary sinuses.  Mild mucosal edema in the nares with scant rhinorrhea.  Bilateral tympanic membranes with normal architecture and good light reflex.    Eyes: Conjunctivae and EOM are normal. Pupils are equal, round, and reactive to light.  Neck: Normal range of motion. Neck supple.  Cardiovascular: Normal rate, regular rhythm and intact distal pulses.   Pulmonary/Chest: Effort normal and breath sounds normal. No stridor. No respiratory distress. She has no wheezes. She has no rales. She exhibits no tenderness.  Abdominal: Soft. She exhibits no distension and no mass. There is no tenderness. There is no rebound and no guarding. No hernia.    Musculoskeletal: Normal range of motion.  Neurological: She is alert and oriented to person, place, and time.  Skin: Capillary refill takes less than 2 seconds. She is not diaphoretic.  Psychiatric: She has a normal mood and affect.  Nursing note and vitals reviewed.    ED Treatments / Results  Labs (all labs ordered are listed, but only abnormal results are displayed) Labs Reviewed - No data to display  EKG  EKG Interpretation None       Radiology Dg Chest 2 View  Result Date: 02/09/2016 CLINICAL DATA:  Cough and congestion for 3 days. EXAM: CHEST  2 VIEW COMPARISON:  08/08/2015 FINDINGS: The lungs are clear. The pulmonary vasculature is normal. Heart size is normal. Hilar and mediastinal contours are unremarkable. There is no pleural effusion. IMPRESSION: No active cardiopulmonary disease. Electronically Signed   By: Shaune Pascal  Alroy Dust M.D.   On: 02/09/2016 22:12    Procedures Procedures (including critical care time)  Medications Ordered in ED Medications - No data to display   Initial Impression / Assessment and Plan / ED Course  I have reviewed the triage vital signs and the nursing notes.  Pertinent labs & imaging results that were available during my care of the patient were reviewed by me and considered in my medical decision making (see chart for details).  Clinical Course    Vitals:   02/09/16 2127 02/09/16 2131 02/09/16 2258  BP:  128/70 110/58  Pulse:  72 73  Resp:  20 20  Temp:  98.2 F (36.8 C) 98.3 F (36.8 C)  TempSrc:  Oral Oral  SpO2: 100% 100% 100%  Weight:  108.9 kg   Height:  5\' 9"  (1.753 m)     Medications - No data to display  Margaret Owens is 45 y.o. female presenting with dry cough, pleuritic chest pain, worse voice worsening over the course of 3 days. Symptom constellation more consistent with a viral laryngitis. Lung sounds are clear to auscultation, she's not short of breath, no wheezing, doubt this is a exacerbating her asthma.  Chest x-rays without infiltrate.  Evaluation does not show pathology that would require ongoing emergent intervention or inpatient treatment. Pt is hemodynamically stable and mentating appropriately. Discussed findings and plan with patient/guardian, who agrees with care plan. All questions answered. Return precautions discussed and outpatient follow up given.    Final Clinical Impressions(s) / ED Diagnoses   Final diagnoses:  Viral laryngitis    New Prescriptions New Prescriptions   HYDROCODONE-ACETAMINOPHEN (NORCO/VICODIN) 5-325 MG TABLET    Take 1-2 tablets by mouth every 6 hours as needed for pain and/or cough.   PREDNISONE (DELTASONE) 20 MG TABLET    Take 2 tablets (40 mg total) by mouth daily.     Monico Blitz, PA-C 02/09/16 [id number]    Tanna Furry, MD 02/18/16 [phone]

## 2016-07-08 ENCOUNTER — Other Ambulatory Visit (HOSPITAL_BASED_OUTPATIENT_CLINIC_OR_DEPARTMENT_OTHER): Payer: Self-pay | Admitting: Nurse Practitioner

## 2016-07-08 DIAGNOSIS — M545 Low back pain, unspecified: Secondary | ICD-10-CM

## 2016-07-09 ENCOUNTER — Other Ambulatory Visit (HOSPITAL_BASED_OUTPATIENT_CLINIC_OR_DEPARTMENT_OTHER): Payer: Self-pay | Admitting: Nurse Practitioner

## 2016-07-09 DIAGNOSIS — M5440 Lumbago with sciatica, unspecified side: Secondary | ICD-10-CM

## 2016-07-11 ENCOUNTER — Encounter (HOSPITAL_BASED_OUTPATIENT_CLINIC_OR_DEPARTMENT_OTHER): Payer: Self-pay

## 2016-07-11 ENCOUNTER — Encounter (INDEPENDENT_AMBULATORY_CARE_PROVIDER_SITE_OTHER): Payer: Self-pay

## 2016-07-11 ENCOUNTER — Ambulatory Visit (HOSPITAL_BASED_OUTPATIENT_CLINIC_OR_DEPARTMENT_OTHER)
Admission: RE | Admit: 2016-07-11 | Discharge: 2016-07-11 | Disposition: A | Discharge status: Home / Self Care | Payer: Medicaid Other | Source: Ambulatory Visit | Attending: Nurse Practitioner | Admitting: Nurse Practitioner

## 2016-07-11 DIAGNOSIS — M5440 Lumbago with sciatica, unspecified side: Secondary | ICD-10-CM

## 2016-07-18 ENCOUNTER — Ambulatory Visit (HOSPITAL_BASED_OUTPATIENT_CLINIC_OR_DEPARTMENT_OTHER)
Admission: RE | Admit: 2016-07-18 | Discharge: 2016-07-18 | Disposition: A | Discharge status: Home / Self Care | Payer: Medicaid Other | Source: Ambulatory Visit | Attending: Nurse Practitioner | Admitting: Nurse Practitioner

## 2016-07-18 DIAGNOSIS — R200 Anesthesia of skin: Secondary | ICD-10-CM | POA: Diagnosis not present

## 2016-07-18 DIAGNOSIS — M4316 Spondylolisthesis, lumbar region: Secondary | ICD-10-CM | POA: Diagnosis not present

## 2016-07-18 DIAGNOSIS — M47816 Spondylosis without myelopathy or radiculopathy, lumbar region: Secondary | ICD-10-CM | POA: Insufficient documentation

## 2016-07-18 DIAGNOSIS — M5440 Lumbago with sciatica, unspecified side: Secondary | ICD-10-CM | POA: Diagnosis not present

## 2016-07-18 DIAGNOSIS — R202 Paresthesia of skin: Secondary | ICD-10-CM | POA: Insufficient documentation

## 2016-08-13 ENCOUNTER — Emergency Department (HOSPITAL_COMMUNITY)
Admission: EM | Admit: 2016-08-13 | Discharge: 2016-08-13 | Disposition: A | Discharge status: Home / Self Care | Payer: Medicaid Other | Attending: Emergency Medicine | Admitting: Emergency Medicine

## 2016-08-13 ENCOUNTER — Emergency Department (HOSPITAL_COMMUNITY): Payer: Medicaid Other

## 2016-08-13 ENCOUNTER — Encounter (HOSPITAL_COMMUNITY): Payer: Self-pay | Admitting: Emergency Medicine

## 2016-08-13 DIAGNOSIS — R110 Nausea: Secondary | ICD-10-CM | POA: Diagnosis not present

## 2016-08-13 DIAGNOSIS — T402X1A Poisoning by other opioids, accidental (unintentional), initial encounter: Secondary | ICD-10-CM | POA: Insufficient documentation

## 2016-08-13 DIAGNOSIS — K5903 Drug induced constipation: Secondary | ICD-10-CM | POA: Diagnosis not present

## 2016-08-13 DIAGNOSIS — N760 Acute vaginitis: Secondary | ICD-10-CM | POA: Diagnosis not present

## 2016-08-13 DIAGNOSIS — Z91040 Latex allergy status: Secondary | ICD-10-CM | POA: Insufficient documentation

## 2016-08-13 DIAGNOSIS — J45909 Unspecified asthma, uncomplicated: Secondary | ICD-10-CM | POA: Insufficient documentation

## 2016-08-13 DIAGNOSIS — B9689 Other specified bacterial agents as the cause of diseases classified elsewhere: Secondary | ICD-10-CM | POA: Insufficient documentation

## 2016-08-13 DIAGNOSIS — R1084 Generalized abdominal pain: Secondary | ICD-10-CM | POA: Diagnosis present

## 2016-08-13 DIAGNOSIS — Y639 Failure in dosage during unspecified surgical and medical care: Secondary | ICD-10-CM | POA: Insufficient documentation

## 2016-08-13 DIAGNOSIS — Z79899 Other long term (current) drug therapy: Secondary | ICD-10-CM | POA: Insufficient documentation

## 2016-08-13 DIAGNOSIS — I10 Essential (primary) hypertension: Secondary | ICD-10-CM | POA: Insufficient documentation

## 2016-08-13 HISTORY — DX: Leiomyoma of uterus, unspecified: D25.9

## 2016-08-13 LAB — CBC WITH DIFFERENTIAL/PLATELET
BASOS ABS: 0.1 10*3/uL (ref 0.0–0.1)
BASOS PCT: 1 %
EOS ABS: 0.2 10*3/uL (ref 0.0–0.7)
EOS PCT: 2 %
HCT: 35.8 % — ABNORMAL LOW (ref 36.0–46.0)
HEMOGLOBIN: 11.9 g/dL — AB (ref 12.0–15.0)
LYMPHS ABS: 2.7 10*3/uL (ref 0.7–4.0)
Lymphocytes Relative: 34 %
MCH: 28.4 pg (ref 26.0–34.0)
MCHC: 33.2 g/dL (ref 30.0–36.0)
MCV: 85.4 fL (ref 78.0–100.0)
Monocytes Absolute: 0.4 10*3/uL (ref 0.1–1.0)
Monocytes Relative: 5 %
NEUTROS PCT: 58 %
Neutro Abs: 4.6 10*3/uL (ref 1.7–7.7)
PLATELETS: 414 10*3/uL — AB (ref 150–400)
RBC: 4.19 MIL/uL (ref 3.87–5.11)
RDW: 14.6 % (ref 11.5–15.5)
WBC: 7.9 10*3/uL (ref 4.0–10.5)

## 2016-08-13 LAB — WET PREP, GENITAL
SPERM: NONE SEEN
TRICH WET PREP: NONE SEEN
YEAST WET PREP: NONE SEEN

## 2016-08-13 LAB — URINALYSIS, ROUTINE W REFLEX MICROSCOPIC
Bilirubin Urine: NEGATIVE
GLUCOSE, UA: NEGATIVE mg/dL
Hgb urine dipstick: NEGATIVE
KETONES UR: NEGATIVE mg/dL
LEUKOCYTES UA: NEGATIVE
NITRITE: NEGATIVE
PROTEIN: NEGATIVE mg/dL
Specific Gravity, Urine: 1.023 (ref 1.005–1.030)
pH: 6 (ref 5.0–8.0)

## 2016-08-13 LAB — COMPREHENSIVE METABOLIC PANEL
ALBUMIN: 3.9 g/dL (ref 3.5–5.0)
ALT: 12 U/L — ABNORMAL LOW (ref 14–54)
ANION GAP: 7 (ref 5–15)
AST: 18 U/L (ref 15–41)
Alkaline Phosphatase: 61 U/L (ref 38–126)
BILIRUBIN TOTAL: 0.5 mg/dL (ref 0.3–1.2)
BUN: 9 mg/dL (ref 6–20)
CO2: 25 mmol/L (ref 22–32)
Calcium: 8.7 mg/dL — ABNORMAL LOW (ref 8.9–10.3)
Chloride: 107 mmol/L (ref 101–111)
Creatinine, Ser: 0.56 mg/dL (ref 0.44–1.00)
GFR calc non Af Amer: >60 mL/min (ref >60)
GLUCOSE: 115 mg/dL — AB (ref 65–99)
POTASSIUM: 4.2 mmol/L (ref 3.5–5.1)
SODIUM: 139 mmol/L (ref 135–145)
TOTAL PROTEIN: 7.8 g/dL (ref 6.5–8.1)

## 2016-08-13 LAB — I-STAT BETA HCG BLOOD, ED (MC, WL, AP ONLY)

## 2016-08-13 LAB — LIPASE, BLOOD: LIPASE: 21 U/L (ref 11–51)

## 2016-08-13 MED ORDER — METRONIDAZOLE 500 MG PO TABS: 500.0000 mg | ORAL_TABLET | Freq: Two times a day (BID) | ORAL | 0 refills | Status: AC

## 2016-08-13 MED ORDER — ONDANSETRON 4 MG PO TBDP
4.0000 mg | ORAL_TABLET | Freq: Once | ORAL | Status: AC
Start: 2016-08-13 — End: 2016-08-13
  Administered 2016-08-13: 4 mg via ORAL
  Filled 2016-08-13: qty 1

## 2016-08-13 MED ORDER — METRONIDAZOLE 500 MG PO TABS
500.0000 mg | ORAL_TABLET | Freq: Once | ORAL | Status: AC
  Administered 2016-08-13: 500 mg via ORAL
  Filled 2016-08-13: qty 1

## 2016-08-13 MED ORDER — SODIUM CHLORIDE 0.9 % IV BOLUS (SEPSIS)
500.0000 mL | Freq: Once | INTRAVENOUS | Status: AC
  Administered 2016-08-13: 500 mL via INTRAVENOUS

## 2016-08-13 MED ORDER — POLYETHYLENE GLYCOL 3350 17 G PO PACK: 17.0000 g | PACK | Freq: Every day | ORAL | 0 refills | Status: DC | PRN

## 2016-08-13 MED ORDER — FENTANYL CITRATE (PF) 100 MCG/2ML IJ SOLN
50.0000 ug | Freq: Once | INTRAMUSCULAR | Status: AC
  Administered 2016-08-13: 50 ug via INTRAVENOUS
  Filled 2016-08-13: qty 2

## 2016-08-13 MED ORDER — IOPAMIDOL (ISOVUE-300) INJECTION 61%
INTRAVENOUS | Status: AC
  Filled 2016-08-13: qty 100

## 2016-08-13 MED ORDER — IOPAMIDOL (ISOVUE-300) INJECTION 61%
100.0000 mL | Freq: Once | INTRAVENOUS | Status: AC | PRN
  Administered 2016-08-13: 100 mL via INTRAVENOUS

## 2016-08-13 NOTE — ED Triage Notes (Signed)
Patient reports irregular menstrual cycle (reports her menstrual period was a few days late and only light spotting). Patient is complaining of abdominal pain, n/v/d, intermittent headaches x3-4 weeks.

## 2016-08-13 NOTE — ED Notes (Signed)
UNABLE TO COLLECT LABS AT THIS TIME MD AT BEDSIDE

## 2016-08-13 NOTE — ED Provider Notes (Signed)
[facility] DEPT Provider Note   CSN: [id number] Arrival date & time: 08/13/16  0954     History   Chief Complaint Chief Complaint  Patient presents with  . Abdominal Pain  . Headache    HPI Margaret Owens is a 46 y.o. female.   Abdominal Pain   This is a new problem. Episode onset: several weeks. The problem occurs daily (intermittent and fluctuating). The problem has not changed since onset.The pain is located in the generalized abdominal region. The quality of the pain is cramping. The pain is moderate. Associated symptoms include anorexia, nausea, vomiting (once last week) and headaches. Pertinent negatives include fever. Nothing aggravates the symptoms. Nothing relieves the symptoms.  Headache   This is a recurrent problem. Episode frequency: daily. The problem has been resolved. Associated with: sinus pressure. The pain is located in the frontal region. The pain is mild. Associated symptoms include anorexia, nausea and vomiting (once last week). Pertinent negatives include no fever. Treatments tried: nasal steroids. The treatment provided significant relief.    Past Medical History:  Diagnosis Date  . Arthritis   . Asthma   . Bursitis   . GERD (gastroesophageal reflux disease)   . Hypertension   . Muscle spasms of both lower extremities   . Sciatica   . Uterine fibroid     There are no active problems to display for this patient.   Past Surgical History:  Procedure Laterality Date  . colonoscopy      OB History    No data available       Home Medications    Prior to Admission medications   Medication Sig Start Date End Date Taking? Authorizing Provider  albuterol (PROVENTIL HFA;VENTOLIN HFA) 108 (90 BASE) MCG/ACT inhaler Inhale 2 puffs into the lungs every 6 (six) hours as needed for wheezing (wheezing).    Yes Historical Provider, MD  budesonide-formoterol (SYMBICORT) 160-4.5 MCG/ACT inhaler Inhale 2 puffs into the lungs 2 (two) times daily as needed  (shortness of breath.).    Yes Historical Provider, MD  cetirizine (ZYRTEC) 10 MG tablet Take 10 mg by mouth daily.   Yes Historical Provider, MD  citalopram (CELEXA) 10 MG tablet Take 10 mg by mouth daily.   Yes Historical Provider, MD  desonide (DESOWEN) 0.05 % cream Apply 1 application topically 2 (two) times daily.   Yes Historical Provider, MD  diclofenac sodium (VOLTAREN) 1 % GEL Apply 2 g topically 4 (four) times daily as needed (apply to leg).    Yes Historical Provider, MD  fluticasone (FLONASE) 50 MCG/ACT nasal spray Place 2 sprays into both nostrils daily.   Yes Historical Provider, MD  LYRICA 25 MG capsule Take 25 mg by mouth 3 (three) times daily as needed for pain. 02/03/16  Yes Historical Provider, MD  meloxicam (MOBIC) 15 MG tablet Take 15 mg by mouth daily.   Yes Historical Provider, MD  oxyCODONE-acetaminophen (PERCOCET/ROXICET) 5-325 MG tablet Take 1 tablet by mouth every 4 (four) hours as needed for severe pain.    Yes Historical Provider, MD  pantoprazole (PROTONIX) 40 MG tablet Take 40 mg by mouth 2 (two) times daily.    Yes Historical Provider, MD  phentermine (ADIPEX-P) 37.5 MG tablet Take 37.5 mg by mouth daily before breakfast.   Yes Historical Provider, MD  tiZANidine (ZANAFLEX) 4 MG tablet Take 4 mg by mouth 2 (two) times daily.    Yes Historical Provider, MD  traZODone (DESYREL) 50 MG tablet Take 50 mg by mouth at  bedtime as needed for sleep (sleep).    Yes Historical Provider, MD  Vitamin D, Ergocalciferol, (DRISDOL) 50000 UNITS CAPS capsule Take 50,000 Units by mouth every 7 (seven) days.   Yes Historical Provider, MD  metroNIDAZOLE (FLAGYL) 500 MG tablet Take 1 tablet (500 mg total) by mouth 2 (two) times daily. 08/13/16 08/20/16  Fatima Blank, MD  polyethylene glycol [facility]) packet Take 17 g by mouth daily as needed. 08/13/16   Fatima Blank, MD    Family History Family History  Problem Relation Age of Onset  . Osteoarthritis Mother   . Cancer  Mother     Social History Social History  Substance Use Topics  . Smoking status: Never Smoker  . Smokeless tobacco: Never Used  . Alcohol use No     Allergies   Latex and Penicillins   Review of Systems Review of Systems  Constitutional: Negative for fever.  Gastrointestinal: Positive for abdominal pain, anorexia, nausea and vomiting (once last week).  Genitourinary: Positive for pelvic pain and vaginal discharge. Negative for vaginal bleeding.  Neurological: Positive for headaches.  All other systems are reviewed and are negative for acute change except as noted in the HPI   Physical Exam Updated Vital Signs BP (!) 134/102 (BP Location: Left Arm)   Pulse 82   Temp 98.1 F (36.7 C) (Oral)   Resp 16   Ht 5\' 9"  (1.753 m)   Wt 240 lb (108.9 kg)   LMP 07/31/2016   SpO2 100%   BMI 35.44 kg/m   Physical Exam  Constitutional: She is oriented to person, place, and time. She appears well-developed and well-nourished. No distress.  HENT:  Head: Normocephalic and atraumatic.  Nose: Nose normal.  Eyes: Conjunctivae and EOM are normal. Pupils are equal, round, and reactive to light. Right eye exhibits no discharge. Left eye exhibits no discharge. No scleral icterus.  Neck: Normal range of motion. Neck supple.  Cardiovascular: Normal rate and regular rhythm.  Exam reveals no gallop and no friction rub.   No murmur heard. Pulmonary/Chest: Effort normal and breath sounds normal. No stridor. No respiratory distress. She has no rales.  Abdominal: Soft. She exhibits no distension. There is tenderness in the suprapubic area and left lower quadrant. There is no rigidity, no rebound, no guarding and no CVA tenderness.    Genitourinary: Cervix exhibits no motion tenderness, no discharge and no friability. Right adnexum displays no mass and no tenderness. Left adnexum displays no tenderness. No erythema, tenderness or bleeding in the vagina. No foreign body in the vagina. No signs of  injury around the vagina. Vaginal discharge found.  Genitourinary Comments: Chaperone present during pelvic exam.  Musculoskeletal: She exhibits no edema or tenderness.  Neurological: She is alert and oriented to person, place, and time.  Skin: Skin is warm and dry. No rash noted. She is not diaphoretic. No erythema.  Psychiatric: She has a normal mood and affect.  Vitals reviewed.    ED Treatments / Results  Labs (all labs ordered are listed, but only abnormal results are displayed) Labs Reviewed  WET PREP, GENITAL - Abnormal; Notable for the following:       Result Value   Clue Cells Wet Prep HPF POC PRESENT (*)    WBC, Wet Prep HPF POC FEW (*)    All other components within normal limits  COMPREHENSIVE METABOLIC PANEL - Abnormal; Notable for the following:    Glucose, Bld 115 (*)    Calcium 8.7 (*)  ALT 12 (*)    All other components within normal limits  CBC WITH DIFFERENTIAL/PLATELET - Abnormal; Notable for the following:    Hemoglobin 11.9 (*)    HCT 35.8 (*)    Platelets 414 (*)    All other components within normal limits  LIPASE, BLOOD  URINALYSIS, ROUTINE W REFLEX MICROSCOPIC  I-STAT BETA HCG BLOOD, ED ([facility], [facility], [facility] ONLY)  GC/CHLAMYDIA PROBE AMP ([place]) NOT AT [facility]    EKG  EKG Interpretation None       Radiology Ct Abdomen Pelvis W Contrast  Result Date: 08/13/2016 CLINICAL DATA:  Irregular menstrual cycle, abdominal pain, nausea, vomiting, diarrhea, intermittent headaches for 1 month. EXAM: CT ABDOMEN AND PELVIS WITH CONTRAST TECHNIQUE: Multidetector CT imaging of the abdomen and pelvis was performed using the standard protocol following bolus administration of intravenous contrast. CONTRAST:  100 cc ISOVUE-300 IOPAMIDOL (ISOVUE-300) INJECTION 61% COMPARISON:  None. FINDINGS: LOWER CHEST: Focal RIGHT lower lobe mild bronchiectasis and atelectasis. Included heart size is normal. No pericardial effusion. HEPATOBILIARY: Liver and gallbladder are normal.  PANCREAS: Normal. SPLEEN: Normal. ADRENALS/URINARY TRACT: Kidneys are orthotopic, demonstrating symmetric enhancement. No nephrolithiasis, hydronephrosis or solid renal masses. The unopacified ureters are normal in course and caliber. Delayed imaging through the kidneys demonstrates symmetric prompt contrast excretion within the proximal urinary collecting system. Urinary bladder is partially distended and unremarkable. Normal adrenal glands. STOMACH/BOWEL: The stomach, small and large bowel are normal in course and caliber without inflammatory changes, sensitivity decreased without oral contrast. Moderate retained large bowel stool. Normal appendix. VASCULAR/LYMPHATIC: Aortoiliac vessels are normal in course and caliber. No lymphadenopathy by CT size criteria. REPRODUCTIVE: Normal. 2.5 cm benign-appearing LEFT adnexal cyst/dominant follicle. OTHER: Trace free fluid in RIGHT pelvis. No drainable fluid collection or intraperitoneal free air. MUSCULOSKELETAL: Nonacute. Mild sacroiliac osteoarthrosis. Stable grade 1 L4-5 anterolisthesis with widened facets and degenerative change of the lower lumbar spine. Severe L4-5 and L5-S1 neural foraminal narrowing better demonstrate on recent MRI. Small fat containing umbilical hernia. IMPRESSION: Moderate retained large bowel stool without bowel obstruction or acute intra-abdominal/ pelvic process. 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 Electronically Signed   By: Elon Alas M.D.   On: 08/13/2016 14:06    Procedures Procedures (including critical care time)  Medications Ordered in ED Medications  ondansetron (ZOFRAN-ODT) disintegrating tablet 4 mg (4 mg Oral Given 08/13/16 1047)  metroNIDAZOLE (FLAGYL) tablet 500 mg (500 mg Oral Given 08/13/16 1207)  fentaNYL (SUBLIMAZE) injection 50 mcg (50 mcg Intravenous Given 08/13/16 1206)  sodium chloride 0.9 % bolus 500 mL (500 mLs Intravenous New  Bag/Given 08/13/16 1206)  iopamidol (ISOVUE-300) 61 % injection 100 mL (100 mLs Intravenous Contrast Given 08/13/16 1339)     Initial Impression / Assessment and Plan / ED Course  I have reviewed the triage vital signs and the nursing notes.  Pertinent labs & imaging results that were available during my care of the patient were reviewed by me and considered in my medical decision making (see chart for details).     Workup consistent with bacterial vaginosis without evidence of Trichomonas. Exam findings without evidence suggestive for cervicitis/PID. Do not feel that empiric treatment for GC/chlamydia is warranted at this time. Culture sent and will await the results.  Given the left lower quadrant tenderness to palpation, CT scan was obtained which ruled out diverticulitis, small bowel obstruction, appendicitis. Did note large stool burden, consistent with constipation which is likely secondary to the patient's opioid  use. Patient already on constipation medicine but I recommended use of MiraLAX in addition to this.  Otherwise workup including labs are grossly reassuring.  Patient safe for discharge with strict return precautions.  Final Clinical Impressions(s) / ED Diagnoses   Final diagnoses:  Drug-induced constipation  Generalized abdominal pain  Nausea  Bacterial vaginosis   Disposition: Discharge  Condition: Good  I have discussed the results, Dx and Tx plan with the patient who expressed understanding and agree(s) with the plan. Discharge instructions discussed at great length. The patient was given strict return precautions who verbalized understanding of the instructions. No further questions at time of discharge.    New Prescriptions   METRONIDAZOLE (FLAGYL) 500 MG TABLET    Take 1 tablet (500 mg total) by mouth 2 (two) times daily.   POLYETHYLENE GLYCOL (MIRALAX) PACKET    Take 17 g by mouth daily as needed.    Follow Up: Nolene Ebbs, MD [place]  [id number] [phone]  Schedule an appointment as soon as possible for a visit  As needed       Fatima Blank, MD 08/13/16 1442

## 2016-08-14 LAB — GC/CHLAMYDIA PROBE AMP (~~LOC~~) NOT AT ARMC
Chlamydia: NEGATIVE
Neisseria Gonorrhea: NEGATIVE

## 2017-02-03 ENCOUNTER — Emergency Department (HOSPITAL_COMMUNITY)
Admission: EM | Admit: 2017-02-03 | Discharge: 2017-02-04 | Disposition: A | Discharge status: Home / Self Care | Payer: Medicaid Other | Attending: Emergency Medicine | Admitting: Emergency Medicine

## 2017-02-03 ENCOUNTER — Encounter (HOSPITAL_COMMUNITY): Payer: Self-pay | Admitting: *Deleted

## 2017-02-03 DIAGNOSIS — Z79899 Other long term (current) drug therapy: Secondary | ICD-10-CM | POA: Diagnosis not present

## 2017-02-03 DIAGNOSIS — M79672 Pain in left foot: Secondary | ICD-10-CM | POA: Diagnosis not present

## 2017-02-03 DIAGNOSIS — I10 Essential (primary) hypertension: Secondary | ICD-10-CM | POA: Insufficient documentation

## 2017-02-03 NOTE — ED Triage Notes (Signed)
Pt has had burning, sharp pains to L foot and heel for 3-4 weeks. Denies injury. Reports this 'being my good side because of sciatica and bursitis in my R hip and leg." Pt uses a cane to ambulate.

## 2017-02-04 ENCOUNTER — Emergency Department (HOSPITAL_COMMUNITY): Payer: Medicaid Other

## 2017-02-04 MED ORDER — NAPROXEN 375 MG PO TABS: 375.0000 mg | ORAL_TABLET | Freq: Two times a day (BID) | ORAL | 0 refills | Status: DC

## 2017-02-04 MED ORDER — NAPROXEN 250 MG PO TABS
500.0000 mg | ORAL_TABLET | Freq: Once | ORAL | Status: AC
  Administered 2017-02-04: 500 mg via ORAL
  Filled 2017-02-04: qty 2

## 2017-02-04 NOTE — ED Notes (Signed)
Patient transported to X-ray 

## 2017-02-04 NOTE — ED Notes (Signed)
Ortho tech paged and aware of need for CAM walker

## 2017-02-04 NOTE — ED Provider Notes (Signed)
[facility] EMERGENCY DEPARTMENT Provider Note   CSN: [id number] Arrival date & time: 02/03/17  1905     History   Chief Complaint Chief Complaint  Patient presents with  . Foot Pain    HPI Margaret Owens is a 46 y.o. female.  The patient is here for evaluation of progressively worsening left heel pain and swelling for the past 3-4 weeks. She has not been to her doctor for evaluation. She states that tonight she was unable to bear weight so she decided to come to the emergency department for evaluation. She has chronic right LE pain and weakness from sciatica and has to use a cane to walk, so walking has become more difficult with the left foot pain. No falls. No numbness, redness or fever.    The history is provided by the patient. No language interpreter was used.  Foot Pain     Past Medical History:  Diagnosis Date  . Arthritis   . Asthma   . Bursitis   . GERD (gastroesophageal reflux disease)   . Hypertension   . Muscle spasms of both lower extremities   . Sciatica   . Uterine fibroid     There are no active problems to display for this patient.   Past Surgical History:  Procedure Laterality Date  . colonoscopy      OB History    No data available       Home Medications    Prior to Admission medications   Medication Sig Start Date End Date Taking? Authorizing Provider  albuterol (PROVENTIL HFA;VENTOLIN HFA) 108 (90 BASE) MCG/ACT inhaler Inhale 2 puffs into the lungs every 6 (six) hours as needed for wheezing (wheezing).     [provider]  budesonide-formoterol (SYMBICORT) 160-4.5 MCG/ACT inhaler Inhale 2 puffs into the lungs 2 (two) times daily as needed (shortness of breath.).     [provider]  cetirizine (ZYRTEC) 10 MG tablet Take 10 mg by mouth daily.    [provider]  citalopram (CELEXA) 10 MG tablet Take 10 mg by mouth daily.    [provider]  desonide (DESOWEN) 0.05 % cream Apply 1  application topically 2 (two) times daily.    [provider]  diclofenac sodium (VOLTAREN) 1 % GEL Apply 2 g topically 4 (four) times daily as needed (apply to leg).     [provider]  fluticasone (FLONASE) 50 MCG/ACT nasal spray Place 2 sprays into both nostrils daily.    [provider]  LYRICA 25 MG capsule Take 25 mg by mouth 3 (three) times daily as needed for pain. 02/03/16   [provider]  meloxicam (MOBIC) 15 MG tablet Take 15 mg by mouth daily.    [provider]  oxyCODONE-acetaminophen (PERCOCET/ROXICET) 5-325 MG tablet Take 1 tablet by mouth every 4 (four) hours as needed for severe pain.     [provider]  pantoprazole (PROTONIX) 40 MG tablet Take 40 mg by mouth 2 (two) times daily.     [provider]  phentermine (ADIPEX-P) 37.5 MG tablet Take 37.5 mg by mouth daily before breakfast.    [provider]  polyethylene glycol (MIRALAX) packet Take 17 g by mouth daily as needed. 08/13/16   Cardama, Grayce Sessions, MD  tiZANidine (ZANAFLEX) 4 MG tablet Take 4 mg by mouth 2 (two) times daily.     [provider]  traZODone (DESYREL) 50 MG tablet Take 50 mg by mouth at bedtime as  needed for sleep (sleep).     [provider]  Vitamin D, Ergocalciferol, (DRISDOL) 50000 UNITS CAPS capsule Take 50,000 Units by mouth every 7 (seven) days.    [provider]    Family History Family History  Problem Relation Age of Onset  . Osteoarthritis Mother   . Cancer Mother     Social History Social History  Substance Use Topics  . Smoking status: Never Smoker  . Smokeless tobacco: Never Used  . Alcohol use No     Allergies   Latex and Penicillins   Review of Systems Review of Systems  Constitutional: Negative for fever.  Gastrointestinal: Negative.  Negative for nausea.  Musculoskeletal:       See HPI.  Skin: Negative.  Negative for color change and wound.  Neurological: Negative  for weakness and numbness.     Physical Exam Updated Vital Signs BP 132/80   Pulse 81   Temp 98.4 F (36.9 C) (Oral)   Resp 18   LMP 01/14/2017   SpO2 100%   Physical Exam  Constitutional: She is oriented to person, place, and time. She appears well-developed and well-nourished.  Neck: Normal range of motion.  Cardiovascular: Intact distal pulses.   Pulmonary/Chest: Effort normal.  Musculoskeletal:  Left foot is swollen over medial heel. No redness or warmth. Significantly tender. Remainder foot is unremarkable in appearance and nontender. No calf tenderness. Achilles intact.   Neurological: She is alert and oriented to person, place, and time.  Skin: Skin is warm and dry.     ED Treatments / Results  Labs (all labs ordered are listed, but only abnormal results are displayed) Labs Reviewed - No data to display  EKG  EKG Interpretation None       Radiology Dg Foot Complete Left  Result Date: 02/04/2017 CLINICAL DATA:  Dorsal foot pain for week.  No injury. EXAM: LEFT FOOT - COMPLETE 3+ VIEW COMPARISON:  None. FINDINGS: No acute fracture deformity or dislocation. No destructive bony lesions. Mild first metatarsophalangeal joint space narrowing and mild cystic changes of the first metatarsal head compatible with osteoarthrosis. Small plantar calcaneal spur. Soft tissue planes are not suspicious. IMPRESSION: No acute osseous process. Mild first MTP osteoarthrosis. Electronically Signed   By: Elon Alas M.D.   On: 02/04/2017 00:55    Procedures Procedures (including critical care time)  Medications Ordered in ED Medications  naproxen (NAPROSYN) tablet 500 mg (500 mg Oral Given 02/04/17 0015)     Initial Impression / Assessment and Plan / ED Course  I have reviewed the triage vital signs and the nursing notes.  Pertinent labs & imaging results that were available during my care of the patient were reviewed by me and considered in my medical decision making  (see chart for details).     Patient with swelling of the left heel with pain x 4 weeks, worse tonight.  Nothing remarkable seen on imaging. No infection suspected. Will provide supportive care and encourage close follow up with PCP. She is already on chronic opiate medications for pain. Will suggest anti-inflammatory medication be added to her regimen.  Final Clinical Impressions(s) / ED Diagnoses   Final diagnoses:  None   1. Left heel pain  New Prescriptions New Prescriptions   No medications on file     Charlann Lange, Hershal Coria 02/04/17 0129    Forde Dandy, MD 02/04/17 [phone]

## 2017-02-04 NOTE — Progress Notes (Signed)
Orthopedic Tech Progress Note Patient Details:  Margaret Owens 1970-06-26 [id number]  Ortho Devices Type of Ortho Device: CAM walker Ortho Device/Splint Location: lle Ortho Device/Splint Interventions: Ordered, Application, Adjustment   Karolee Stamps 02/04/2017, 3:06 AM

## 2017-03-15 ENCOUNTER — Encounter (HOSPITAL_COMMUNITY): Payer: Self-pay | Admitting: Emergency Medicine

## 2017-03-15 ENCOUNTER — Emergency Department (HOSPITAL_COMMUNITY)
Admission: EM | Admit: 2017-03-15 | Discharge: 2017-03-15 | Disposition: A | Discharge status: Home / Self Care | Payer: Medicaid Other | Attending: Emergency Medicine | Admitting: Emergency Medicine

## 2017-03-15 ENCOUNTER — Other Ambulatory Visit: Payer: Self-pay

## 2017-03-15 DIAGNOSIS — Z79899 Other long term (current) drug therapy: Secondary | ICD-10-CM | POA: Insufficient documentation

## 2017-03-15 DIAGNOSIS — J019 Acute sinusitis, unspecified: Secondary | ICD-10-CM | POA: Diagnosis not present

## 2017-03-15 DIAGNOSIS — I10 Essential (primary) hypertension: Secondary | ICD-10-CM | POA: Diagnosis not present

## 2017-03-15 DIAGNOSIS — J329 Chronic sinusitis, unspecified: Secondary | ICD-10-CM

## 2017-03-15 DIAGNOSIS — Z91040 Latex allergy status: Secondary | ICD-10-CM | POA: Insufficient documentation

## 2017-03-15 DIAGNOSIS — J45909 Unspecified asthma, uncomplicated: Secondary | ICD-10-CM | POA: Diagnosis not present

## 2017-03-15 DIAGNOSIS — H9203 Otalgia, bilateral: Secondary | ICD-10-CM | POA: Diagnosis present

## 2017-03-15 MED ORDER — DOXYCYCLINE HYCLATE 100 MG PO CAPS: 100.0000 mg | ORAL_CAPSULE | Freq: Two times a day (BID) | ORAL | 0 refills | Status: DC

## 2017-03-15 NOTE — Discharge Instructions (Signed)
Return if any problems.

## 2017-03-15 NOTE — ED Triage Notes (Signed)
Patient c/o bilateral ear pain with head "pressure" and congestion x3 days. Reports "dizzy spells" that increase with movement. Denies N/V/D, chest pain, SOB, and abdominal pain.

## 2017-03-15 NOTE — ED Notes (Signed)
Pt states that she has been having ear pain x 3 weeks with dizziness. Pt states that today she had a Rush of pressure and throbbing  10/10 sensation in her head that caused her to become evaluated today

## 2017-03-15 NOTE — ED Provider Notes (Signed)
[facility] DEPT Provider Note   CSN: [id number] Arrival date & time: 03/15/17  1638     History   Chief Complaint Chief Complaint  Patient presents with  . Otalgia    HPI Margaret Owens is a 46 y.o. female.  The history is provided by the patient. No language interpreter was used.  Otalgia  This is a new problem. The current episode started more than 1 week ago. There is pain in both ears. The problem occurs constantly. The problem has been gradually worsening. There has been no fever. The pain is moderate. Pertinent negatives include no rhinorrhea and no sore throat. Her past medical history does not include hearing loss.   Pt reports sinus pressure and drainage.  Pt reports choking and coughing second to drainage.  Pt reports no relief with zyrtec and nasal spray Past Medical History:  Diagnosis Date  . Arthritis   . Asthma   . Bursitis   . GERD (gastroesophageal reflux disease)   . Hypertension   . Muscle spasms of both lower extremities   . Sciatica   . Uterine fibroid     There are no active problems to display for this patient.   Past Surgical History:  Procedure Laterality Date  . colonoscopy      OB History    No data available       Home Medications    Prior to Admission medications   Medication Sig Start Date End Date Taking? Authorizing Provider  albuterol (PROVENTIL HFA;VENTOLIN HFA) 108 (90 BASE) MCG/ACT inhaler Inhale 2 puffs into the lungs every 6 (six) hours as needed for wheezing (wheezing).    Yes [provider]  budesonide-formoterol (SYMBICORT) 160-4.5 MCG/ACT inhaler Inhale 2 puffs into the lungs 2 (two) times daily as needed (shortness of breath.).    Yes [provider]  cetirizine (ZYRTEC) 10 MG tablet Take 10 mg by mouth daily.   Yes [provider]  citalopram (CELEXA) 10 MG tablet Take 10 mg by mouth daily.   Yes [provider]  desonide (DESOWEN) 0.05 % cream  Apply 1 application topically 2 (two) times daily.   Yes [provider]  diclofenac sodium (VOLTAREN) 1 % GEL Apply 2 g topically 4 (four) times daily as needed (apply to leg).    Yes [provider]  fluticasone (FLONASE) 50 MCG/ACT nasal spray Place 2 sprays into both nostrils daily.   Yes [provider]  LYRICA 25 MG capsule Take 25 mg by mouth 3 (three) times daily as needed for pain. 02/03/16  Yes [provider]  meloxicam (MOBIC) 15 MG tablet Take 15 mg by mouth daily.   Yes [provider]  naproxen (NAPROSYN) 375 MG tablet Take 1 tablet (375 mg total) by mouth 2 (two) times daily. 02/04/17  Yes Charlann Lange, PA-C  oxyCODONE-acetaminophen (PERCOCET/ROXICET) 5-325 MG tablet Take 1 tablet by mouth every 4 (four) hours as needed for severe pain.    Yes [provider]  pantoprazole (PROTONIX) 40 MG tablet Take 40 mg by mouth 2 (two) times daily.    Yes [provider]  phentermine (ADIPEX-P) 37.5 MG tablet Take 37.5 mg by mouth daily before breakfast.   Yes [provider]  polyethylene glycol (MIRALAX) packet Take 17 g by mouth daily as needed. 08/13/16  Yes Cardama, Grayce Sessions, MD  tiZANidine (ZANAFLEX) 4 MG tablet Take 4 mg by mouth 2 (two) times daily.    Yes [provider]  traZODone (DESYREL) 50 MG tablet Take 50 mg by mouth at bedtime as needed for sleep (sleep).    Yes [provider]  Vitamin D, Ergocalciferol, (DRISDOL) 50000 UNITS CAPS capsule Take 50,000 Units by mouth every 7 (seven) days.   Yes [provider]  doxycycline (VIBRAMYCIN) 100 MG capsule Take 1 capsule (100 mg total) by mouth 2 (two) times daily. 03/15/17   Fransico Meadow, PA-C    Family History Family History  Problem Relation Age of Onset  . Osteoarthritis Mother   . Cancer Mother     Social History Social History   Tobacco Use  . Smoking status: Never Smoker  . Smokeless tobacco: Never Used    Substance Use Topics  . Alcohol use: No  . Drug use: Yes     Allergies   Latex and Penicillins   Review of Systems Review of Systems  HENT: Positive for ear pain. Negative for rhinorrhea and sore throat.   All other systems reviewed and are negative.    Physical Exam Updated Vital Signs BP 127/83 (BP Location: Right Arm)   Pulse 82   Temp 98.5 F (36.9 C) (Oral)   Resp 20   Ht 5\' 9"  (1.753 m)   Wt 113.4 kg (250 lb)   LMP 03/10/2017   SpO2 100%   BMI 36.92 kg/m   Physical Exam  Constitutional: She appears well-developed and well-nourished.  HENT:  Head: Normocephalic.  Right Ear: External ear normal.  Left Ear: External ear normal.  Nose: Nose normal.  Eyes: Conjunctivae and EOM are normal. Pupils are equal, round, and reactive to light.  Neck: Normal range of motion.  Cardiovascular: Normal rate.  Pulmonary/Chest: Effort normal.  Abdominal: Soft.  Musculoskeletal: Normal range of motion.  Neurological: She is alert.  Skin: Skin is warm.  Psychiatric: She has a normal mood and affect.  Nursing note and vitals reviewed.    ED Treatments / Results  Labs (all labs ordered are listed, but only abnormal results are displayed) Labs Reviewed - No data to display  EKG  EKG Interpretation None       Radiology No results found.  Procedures Procedures (including critical care time)  Medications Ordered in ED Medications - No data to display   Initial Impression / Assessment and Plan / ED Course  I have reviewed the triage vital signs and the nursing notes.  Pertinent labs & imaging results that were available during my care of the patient were reviewed by me and considered in my medical decision making (see chart for details).     I will try treating pt with antibiotics.  Pt is advised to follow up with ENT if symptoms persist after antibiotics.  Final Clinical Impressions(s) / ED Diagnoses   Final diagnoses:  Sinusitis, unspecified  chronicity, unspecified location    ED Discharge Orders        Ordered    doxycycline (VIBRAMYCIN) 100 MG capsule  2 times daily     03/15/17 1847    An After Visit Summary was printed and given to the patient.    Sidney Ace 03/15/17 2004    Sherwood Gambler, MD 03/16/17 [phone]

## 2017-05-04 ENCOUNTER — Other Ambulatory Visit (HOSPITAL_BASED_OUTPATIENT_CLINIC_OR_DEPARTMENT_OTHER): Payer: Self-pay

## 2017-05-04 DIAGNOSIS — R5383 Other fatigue: Secondary | ICD-10-CM

## 2017-05-04 DIAGNOSIS — G4710 Hypersomnia, unspecified: Secondary | ICD-10-CM

## 2017-05-04 DIAGNOSIS — R0683 Snoring: Secondary | ICD-10-CM

## 2017-05-05 ENCOUNTER — Encounter (HOSPITAL_COMMUNITY): Payer: Self-pay | Admitting: Family Medicine

## 2017-05-05 ENCOUNTER — Emergency Department (HOSPITAL_COMMUNITY): Payer: Medicaid Other

## 2017-05-05 DIAGNOSIS — R110 Nausea: Secondary | ICD-10-CM | POA: Insufficient documentation

## 2017-05-05 DIAGNOSIS — I10 Essential (primary) hypertension: Secondary | ICD-10-CM | POA: Diagnosis not present

## 2017-05-05 DIAGNOSIS — M7918 Myalgia, other site: Secondary | ICD-10-CM | POA: Diagnosis not present

## 2017-05-05 DIAGNOSIS — J111 Influenza due to unidentified influenza virus with other respiratory manifestations: Secondary | ICD-10-CM | POA: Diagnosis not present

## 2017-05-05 DIAGNOSIS — Z91040 Latex allergy status: Secondary | ICD-10-CM | POA: Diagnosis not present

## 2017-05-05 DIAGNOSIS — R05 Cough: Secondary | ICD-10-CM | POA: Diagnosis present

## 2017-05-05 DIAGNOSIS — R6883 Chills (without fever): Secondary | ICD-10-CM | POA: Diagnosis not present

## 2017-05-05 DIAGNOSIS — Z79899 Other long term (current) drug therapy: Secondary | ICD-10-CM | POA: Insufficient documentation

## 2017-05-05 NOTE — ED Triage Notes (Signed)
Patient reports she is experiencing a dry cough, body aches, and chills. Symptoms started over the weekend. She reports she has took OTC medication no relief.

## 2017-05-06 ENCOUNTER — Emergency Department (HOSPITAL_COMMUNITY)
Admission: EM | Admit: 2017-05-06 | Discharge: 2017-05-06 | Disposition: A | Discharge status: Home / Self Care | Payer: Medicaid Other | Attending: Emergency Medicine | Admitting: Emergency Medicine

## 2017-05-06 DIAGNOSIS — R6889 Other general symptoms and signs: Secondary | ICD-10-CM

## 2017-05-06 NOTE — Discharge Instructions (Signed)
Recommend Tylenol and ibuprofen for aches and any fever. Push fluids. Return here with any new or concerning symptoms.

## 2017-05-06 NOTE — ED Provider Notes (Signed)
[facility] DEPT Provider Note   CSN: [id number] Arrival date & time: 05/05/17  2109     History   Chief Complaint Chief Complaint  Patient presents with  . Influenza    HPI Margaret Owens is a 47 y.o. female.  Patient presents with 4 days of body aches, chills, cough, congestion and nausea. She has been taking Alka Seltzer without relief. Daughter is at home with same symptoms. She reports cough is non-productive. There are no night sweats. She has lost her appetite and does not feel she is drinking enough fluids. No diarrhea.   The history is provided by the patient. No language interpreter was used.  Influenza  Presenting symptoms: cough, myalgias and nausea   Presenting symptoms: no diarrhea, no fever and no sore throat   Associated symptoms: chills and nasal congestion     Past Medical History:  Diagnosis Date  . Arthritis   . Asthma   . Bursitis   . GERD (gastroesophageal reflux disease)   . Hypertension   . Muscle spasms of both lower extremities   . Sciatica   . Uterine fibroid     There are no active problems to display for this patient.   Past Surgical History:  Procedure Laterality Date  . colonoscopy      OB History    No data available       Home Medications    Prior to Admission medications   Medication Sig Start Date End Date Taking? Authorizing Provider  albuterol (PROVENTIL HFA;VENTOLIN HFA) 108 (90 BASE) MCG/ACT inhaler Inhale 2 puffs into the lungs every 6 (six) hours as needed for wheezing (wheezing).     [provider]  budesonide-formoterol (SYMBICORT) 160-4.5 MCG/ACT inhaler Inhale 2 puffs into the lungs 2 (two) times daily as needed (shortness of breath.).     [provider]  cetirizine (ZYRTEC) 10 MG tablet Take 10 mg by mouth daily.    [provider]  citalopram (CELEXA) 10 MG tablet Take 10 mg by mouth daily.    [provider]  desonide (DESOWEN) 0.05 %  cream Apply 1 application topically 2 (two) times daily.    [provider]  diclofenac sodium (VOLTAREN) 1 % GEL Apply 2 g topically 4 (four) times daily as needed (apply to leg).     [provider]  doxycycline (VIBRAMYCIN) 100 MG capsule Take 1 capsule (100 mg total) by mouth 2 (two) times daily. 03/15/17   Fransico Meadow, PA-C  fluticasone (FLONASE) 50 MCG/ACT nasal spray Place 2 sprays into both nostrils daily.    [provider]  LYRICA 25 MG capsule Take 25 mg by mouth 3 (three) times daily as needed for pain. 02/03/16   [provider]  meloxicam (MOBIC) 15 MG tablet Take 15 mg by mouth daily.    [provider]  naproxen (NAPROSYN) 375 MG tablet Take 1 tablet (375 mg total) by mouth 2 (two) times daily. 02/04/17   Charlann Lange, PA-C  oxyCODONE-acetaminophen (PERCOCET/ROXICET) 5-325 MG tablet Take 1 tablet by mouth every 4 (four) hours as needed for severe pain.     [provider]  pantoprazole (PROTONIX) 40 MG tablet Take 40 mg by mouth 2 (two) times daily.     [provider]  phentermine (ADIPEX-P) 37.5 MG tablet Take 37.5 mg by mouth daily before breakfast.    [provider]  polyethylene glycol (MIRALAX) packet Take 17 g by mouth daily as needed. 08/13/16  Fatima Blank, MD  tiZANidine (ZANAFLEX) 4 MG tablet Take 4 mg by mouth 2 (two) times daily.     [provider]  traZODone (DESYREL) 50 MG tablet Take 50 mg by mouth at bedtime as needed for sleep (sleep).     [provider]  Vitamin D, Ergocalciferol, (DRISDOL) 50000 UNITS CAPS capsule Take 50,000 Units by mouth every 7 (seven) days.    [provider]    Family History Family History  Problem Relation Age of Onset  . Osteoarthritis Mother   . Cancer Mother     Social History Social History   Tobacco Use  . Smoking status: Never Smoker  . Smokeless tobacco: Never Used  Substance Use Topics  . Alcohol use: No    . Drug use: No     Allergies   Latex and Penicillins   Review of Systems Review of Systems  Constitutional: Positive for appetite change and chills. Negative for fever.  HENT: Positive for congestion. Negative for sore throat and trouble swallowing.   Respiratory: Positive for cough.   Cardiovascular: Negative.   Gastrointestinal: Positive for nausea. Negative for diarrhea.  Musculoskeletal: Positive for myalgias.  Skin: Negative.  Negative for rash.  Neurological: Negative.      Physical Exam Updated Vital Signs BP 112/86 (BP Location: Left Arm)   Pulse 88   Temp 99.7 F (37.6 C) (Oral)   Resp 18   Ht 5\' 9"  (1.753 m)   Wt 113.4 kg (250 lb)   LMP 04/11/2017   SpO2 100%   BMI 36.92 kg/m   Physical Exam  Constitutional: She is oriented to person, place, and time. She appears well-developed and well-nourished.  HENT:  Head: Normocephalic.  Mouth/Throat: Oropharynx is clear and moist.  TM's clear bilaterally.  Eyes:  Left eye redness with clear drainage. No conjunctival swelling. No lid swelling. No purulence.   Neck: Normal range of motion. Neck supple.  Cardiovascular: Normal rate and regular rhythm.  Pulmonary/Chest: Effort normal and breath sounds normal.  Abdominal: Soft. Bowel sounds are normal. There is no tenderness. There is no rebound and no guarding.  Musculoskeletal: Normal range of motion.  Neurological: She is alert and oriented to person, place, and time.  Skin: Skin is warm and dry. No rash noted.  Psychiatric: She has a normal mood and affect.     ED Treatments / Results  Labs (all labs ordered are listed, but only abnormal results are displayed) Labs Reviewed - No data to display  EKG  EKG Interpretation None       Radiology Dg Chest 2 View  Result Date: 05/05/2017 CLINICAL DATA:  Cough and fever EXAM: CHEST  2 VIEW COMPARISON:  Chest radiograph 02/09/2016 FINDINGS: The heart size and mediastinal contours are within normal limits.  Both lungs are clear. The visualized skeletal structures are unremarkable. IMPRESSION: Normal chest. Electronically Signed   By: Ulyses Jarred M.D.   On: 05/05/2017 22:55    Procedures Procedures (including critical care time)  Medications Ordered in ED Medications - No data to display   Initial Impression / Assessment and Plan / ED Course  I have reviewed the triage vital signs and the nursing notes.  Pertinent labs & imaging results that were available during my care of the patient were reviewed by me and considered in my medical decision making (see chart for details).     Patient presents with URI symptoms, body aches, loss of appetite and nausea. Symptoms x 4 days. Sick  family members at home.   Suspect flu is likely based on symptoms. She presents after 4 days - Tamiflu not recommended. She can be discharged home with instructions for supportive care.   Final Clinical Impressions(s) / ED Diagnoses   Final diagnoses:  None   1. Flu-like illness  ED Discharge Orders    None       Dennie Bible 05/06/17 [place], April, MD 05/06/17 [phone]

## 2017-05-17 ENCOUNTER — Other Ambulatory Visit: Payer: Self-pay | Admitting: Sports Medicine

## 2017-05-17 DIAGNOSIS — M545 Low back pain: Secondary | ICD-10-CM

## 2017-05-19 ENCOUNTER — Ambulatory Visit (HOSPITAL_BASED_OUTPATIENT_CLINIC_OR_DEPARTMENT_OTHER): Payer: Medicaid Other | Attending: Nurse Practitioner | Admitting: Internal Medicine

## 2017-05-19 VITALS — Ht 69.0 in | Wt 246.0 lb

## 2017-05-19 DIAGNOSIS — G4719 Other hypersomnia: Secondary | ICD-10-CM | POA: Diagnosis present

## 2017-05-19 DIAGNOSIS — R0683 Snoring: Secondary | ICD-10-CM | POA: Diagnosis not present

## 2017-05-19 DIAGNOSIS — Z79899 Other long term (current) drug therapy: Secondary | ICD-10-CM | POA: Insufficient documentation

## 2017-05-19 DIAGNOSIS — I493 Ventricular premature depolarization: Secondary | ICD-10-CM | POA: Insufficient documentation

## 2017-05-19 DIAGNOSIS — R05 Cough: Secondary | ICD-10-CM | POA: Diagnosis not present

## 2017-05-19 DIAGNOSIS — G4761 Periodic limb movement disorder: Secondary | ICD-10-CM | POA: Insufficient documentation

## 2017-05-19 DIAGNOSIS — R5383 Other fatigue: Secondary | ICD-10-CM | POA: Diagnosis not present

## 2017-05-19 DIAGNOSIS — G4710 Hypersomnia, unspecified: Secondary | ICD-10-CM

## 2017-05-21 ENCOUNTER — Other Ambulatory Visit: Payer: Medicaid Other

## 2017-05-23 DIAGNOSIS — R0683 Snoring: Secondary | ICD-10-CM

## 2017-05-23 NOTE — Procedures (Signed)
    Patient Name: Margaret Owens, Margaret Owens Date: 05/19/2017 Gender: Female D.O.B: 1970/09/04 Age (years): 46 Referring Provider: Joyce Copa Height (inches): 64 Interpreting Physician: Baird Lyons MD, ABSM Weight (lbs): 246 RPSGT: Jorge Ny BMI: 36 MRN: [id number] Neck Size: 15.25 <br> <br> CLINICAL INFORMATION Sleep Study Type: NPSG  Indication for sleep study: Excessive Daytime Sleepiness, Fatigue, Snoring  Epworth Sleepiness Score: 23  SLEEP STUDY TECHNIQUE As per the AASM Manual for the Scoring of Sleep and Associated Events v2.3 (April 2016) with a hypopnea requiring 4% desaturations.  The channels recorded and monitored were frontal, central and occipital EEG, electrooculogram (EOG), submentalis EMG (chin), nasal and oral airflow, thoracic and abdominal wall motion, anterior tibialis EMG, snore microphone, electrocardiogram, and pulse oximetry.  MEDICATIONS Medications self-administered by patient taken the night of the study : PREDNISONE, TRAZODONE  SLEEP ARCHITECTURE The study was initiated at 10:55:54 PM and ended at 5:30:05 AM.  Sleep onset time was 58.9 minutes and the sleep efficiency was 74.8%. The total sleep time was 295.0 minutes.  Stage REM latency was 70.0 minutes.  The patient spent 3.56% of the night in stage N1 sleep, 91.02% in stage N2 sleep, 0.00% in stage N3 and 5.42% in REM.  Alpha intrusion was absent.  Supine sleep was 24.07%.  RESPIRATORY PARAMETERS The overall apnea/hypopnea index (AHI) was 1.0 per hour. There were 0 total apneas, including 0 obstructive, 0 central and 0 mixed apneas. There were 5 hypopneas and 10 RERAs.  The AHI during Stage REM sleep was 0.0 per hour.  AHI while supine was 3.4 per hour.  The mean oxygen saturation was 96.97%. The minimum SpO2 during sleep was 93.00%.  loud snoring was noted during this study.  CARDIAC DATA The 2 lead EKG demonstrated sinus rhythm. The mean heart rate was 65.22 beats per  minute. Other EKG findings include: PVCs.  LEG MOVEMENT DATA The total PLMS were 66 with a resulting PLMS index of 13.42. Associated arousal with leg movement index was 6.3 .  IMPRESSIONS - No significant obstructive sleep apnea occurred during this study (AHI = 1.0/h). - No significant central sleep apnea occurred during this study (CAI = 0.0/h). - The patient had minimal or no oxygen desaturation during the study (Min O2 = 93.00%) - The patient snored with loud snoring volume. - EKG findings include PVCs. - Mild periodic limb movements of sleep occurred during the study. Associated arousals were significant. - Sleep was interrupted by cough. Consider possible reflux.  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 - Consider possibility that reflux or other cause of cough may be significant. - Sleep hygiene should be reviewed to assess factors that may improve sleep quality. - Weight management and regular exercise should be initiated or continued if appropriate.  [Electronically signed] 05/23/2017 10:06 AM  Baird Lyons MD, ABSM Diplomate, American Board of Sleep Medicine   NPI: [id number]                         [place]  ELECTRONICALLY SIGNED ON:  05/23/2017, 10:08 AM [place] PH: [phone]   FX: [phone] [place]

## 2017-05-28 ENCOUNTER — Inpatient Hospital Stay (HOSPITAL_COMMUNITY)
Admission: AD | Admit: 2017-05-28 | Discharge: 2017-05-28 | Disposition: A | Discharge status: Home / Self Care | Payer: Medicaid Other | Source: Ambulatory Visit | Attending: Obstetrics and Gynecology | Admitting: Obstetrics and Gynecology

## 2017-05-28 ENCOUNTER — Encounter (HOSPITAL_COMMUNITY): Payer: Self-pay | Admitting: *Deleted

## 2017-05-28 ENCOUNTER — Other Ambulatory Visit: Payer: Self-pay

## 2017-05-28 ENCOUNTER — Inpatient Hospital Stay (HOSPITAL_COMMUNITY): Payer: Medicaid Other

## 2017-05-28 DIAGNOSIS — Z7951 Long term (current) use of inhaled steroids: Secondary | ICD-10-CM | POA: Insufficient documentation

## 2017-05-28 DIAGNOSIS — N946 Dysmenorrhea, unspecified: Secondary | ICD-10-CM | POA: Diagnosis not present

## 2017-05-28 DIAGNOSIS — K529 Noninfective gastroenteritis and colitis, unspecified: Secondary | ICD-10-CM

## 2017-05-28 DIAGNOSIS — Z880 Allergy status to penicillin: Secondary | ICD-10-CM | POA: Diagnosis not present

## 2017-05-28 DIAGNOSIS — R1084 Generalized abdominal pain: Secondary | ICD-10-CM | POA: Diagnosis not present

## 2017-05-28 DIAGNOSIS — R0602 Shortness of breath: Secondary | ICD-10-CM

## 2017-05-28 DIAGNOSIS — Z91040 Latex allergy status: Secondary | ICD-10-CM | POA: Diagnosis not present

## 2017-05-28 DIAGNOSIS — J45909 Unspecified asthma, uncomplicated: Secondary | ICD-10-CM | POA: Diagnosis not present

## 2017-05-28 DIAGNOSIS — M1990 Unspecified osteoarthritis, unspecified site: Secondary | ICD-10-CM | POA: Diagnosis not present

## 2017-05-28 DIAGNOSIS — K219 Gastro-esophageal reflux disease without esophagitis: Secondary | ICD-10-CM | POA: Insufficient documentation

## 2017-05-28 DIAGNOSIS — R140 Abdominal distension (gaseous): Secondary | ICD-10-CM | POA: Diagnosis not present

## 2017-05-28 DIAGNOSIS — Z79899 Other long term (current) drug therapy: Secondary | ICD-10-CM | POA: Diagnosis not present

## 2017-05-28 DIAGNOSIS — Z8261 Family history of arthritis: Secondary | ICD-10-CM | POA: Diagnosis not present

## 2017-05-28 DIAGNOSIS — D259 Leiomyoma of uterus, unspecified: Secondary | ICD-10-CM | POA: Diagnosis not present

## 2017-05-28 DIAGNOSIS — I10 Essential (primary) hypertension: Secondary | ICD-10-CM | POA: Diagnosis not present

## 2017-05-28 LAB — CBC WITH DIFFERENTIAL/PLATELET
BASOS ABS: 0 10*3/uL (ref 0.0–0.1)
BASOS PCT: 0 %
EOS ABS: 0.1 10*3/uL (ref 0.0–0.7)
Eosinophils Relative: 1 %
HEMATOCRIT: 36.8 % (ref 36.0–46.0)
HEMOGLOBIN: 12.3 g/dL (ref 12.0–15.0)
Lymphocytes Relative: 28 %
Lymphs Abs: 3.2 10*3/uL (ref 0.7–4.0)
MCH: 27.6 pg (ref 26.0–34.0)
MCHC: 33.4 g/dL (ref 30.0–36.0)
MCV: 82.7 fL (ref 78.0–100.0)
MONOS PCT: 6 %
Monocytes Absolute: 0.7 10*3/uL (ref 0.1–1.0)
NEUTROS ABS: 7.2 10*3/uL (ref 1.7–7.7)
NEUTROS PCT: 65 %
Platelets: 455 10*3/uL — ABNORMAL HIGH (ref 150–400)
RBC: 4.45 MIL/uL (ref 3.87–5.11)
RDW: 14.6 % (ref 11.5–15.5)
WBC: 11.2 10*3/uL — ABNORMAL HIGH (ref 4.0–10.5)

## 2017-05-28 LAB — COMPREHENSIVE METABOLIC PANEL
ALBUMIN: 3.9 g/dL (ref 3.5–5.0)
ALK PHOS: 61 U/L (ref 38–126)
ALT: 18 U/L (ref 14–54)
ANION GAP: 12 (ref 5–15)
AST: 19 U/L (ref 15–41)
BILIRUBIN TOTAL: 0.6 mg/dL (ref 0.3–1.2)
BUN: 10 mg/dL (ref 6–20)
CALCIUM: 8.6 mg/dL — AB (ref 8.9–10.3)
CO2: 21 mmol/L — AB (ref 22–32)
Chloride: 102 mmol/L (ref 101–111)
Creatinine, Ser: 0.59 mg/dL (ref 0.44–1.00)
GFR calc Af Amer: >60 mL/min (ref >60)
GFR calc non Af Amer: >60 mL/min (ref >60)
GLUCOSE: 105 mg/dL — AB (ref 65–99)
POTASSIUM: 3.2 mmol/L — AB (ref 3.5–5.1)
SODIUM: 135 mmol/L (ref 135–145)
TOTAL PROTEIN: 7.9 g/dL (ref 6.5–8.1)

## 2017-05-28 MED ORDER — ALUM & MAG HYDROXIDE-SIMETH 200-200-20 MG/5ML PO SUSP
30.0000 mL | Freq: Once | ORAL | Status: AC
  Administered 2017-05-28: 30 mL via ORAL
  Filled 2017-05-28: qty 30

## 2017-05-28 MED ORDER — KETOROLAC TROMETHAMINE 60 MG/2ML IM SOLN
60.0000 mg | Freq: Once | INTRAMUSCULAR | Status: AC
  Administered 2017-05-28: 60 mg via INTRAMUSCULAR
  Filled 2017-05-28: qty 2

## 2017-05-28 MED ORDER — GI COCKTAIL ~~LOC~~: 30.0000 mL | Freq: Once | ORAL | Status: DC

## 2017-05-28 MED ORDER — HYOSCYAMINE SULFATE 0.125 MG SL SUBL
0.1250 mg | SUBLINGUAL_TABLET | Freq: Once | SUBLINGUAL | Status: AC
  Administered 2017-05-28: 0.125 mg via SUBLINGUAL
  Filled 2017-05-28: qty 1

## 2017-05-28 NOTE — MAU Provider Note (Signed)
Chief Complaint:  Shortness of Breath and Abdominal Pain   First Provider Initiated Contact with Patient 05/28/17 2012      HPI: Margaret Owens is a 47 y.o. [id number] who presents to maternity admissions reporting dry cough, burning and colicky abdominal pain in upper and middle abdomen, GERD, and shortness of breath. Also has uterine cramping with onset of menses this week. Hx of fibroids and "a cyst blocking my ovary". She reports vaginal bleeding, no vaginal itching/burning, urinary symptoms, h/a, dizziness, n/v, or fever/chills.    Shortness of Breath  This is a new problem. The current episode started 1 to 4 weeks ago. The problem occurs intermittently. The problem has been unchanged. Associated symptoms include abdominal pain and wheezing. Pertinent negatives include no fever, headaches, hemoptysis, leg swelling, orthopnea, rhinorrhea, sputum production or vomiting. Nothing aggravates the symptoms. She has tried beta agonist inhalers for the symptoms. The treatment provided mild relief.  Abdominal Pain  This is a new problem. The current episode started 1 to 4 weeks ago. The onset quality is gradual. The problem occurs intermittently. The problem has been unchanged. The pain is located in the generalized abdominal region. The quality of the pain is colicky, cramping and burning. The abdominal pain does not radiate. Associated symptoms include diarrhea (last week). Pertinent negatives include no constipation, fever, headaches, myalgias, nausea or vomiting. Nothing aggravates the pain. The pain is relieved by nothing. She has tried antacids and H2 blockers for the symptoms. The treatment provided no relief. Her past medical history is significant for GERD.    RN Note: Had flu several wks ago. Cont to have dry cough. Burning pain in upper and mid section. Has GERD and taking medicine as prescribed. SOB since Tuesday with some tightness in chest    Past Medical History: Past Medical History:   Diagnosis Date  . Arthritis   . Asthma   . Bursitis   . GERD (gastroesophageal reflux disease)   . Hypertension   . Muscle spasms of both lower extremities   . Sciatica   . Uterine fibroid     Past obstetric history: OB History  Gravida Para Term Preterm AB Living  3 3 3     3   SAB TAB Ectopic Multiple Live Births          3    # Outcome Date GA Lbr Len/2nd Weight Sex Delivery Anes PTL Lv  3 Term     M Vag-Spont   LIV  2 Term     F Vag-Spont   LIV  1 Term     M Vag-Spont   LIV      Past Surgical History: Past Surgical History:  Procedure Laterality Date  . colonoscopy      Family History: Family History  Problem Relation Age of Onset  . Osteoarthritis Mother   . Cancer Mother     Social History: Social History   Tobacco Use  . Smoking status: Never Smoker  . Smokeless tobacco: Never Used  Substance Use Topics  . Alcohol use: No  . Drug use: No    Allergies:  Allergies  Allergen Reactions  . Latex Hives  . Penicillins Hives    Has patient had a PCN reaction causing immediate rash, facial/tongue/throat swelling, SOB or lightheadedness with hypotension: No Has patient had a PCN reaction causing severe rash involving mucus membranes or skin necrosis: No Has patient had a PCN reaction that required hospitalization No Has patient had a PCN reaction occurring within  the last 10 years: No If all of the above answers are "NO", then may proceed with Cephalosporin use.    Meds:  Medications Prior to Admission  Medication Sig Dispense Refill Last Dose  . albuterol (PROVENTIL HFA;VENTOLIN HFA) 108 (90 BASE) MCG/ACT inhaler Inhale 2 puffs into the lungs every 6 (six) hours as needed for wheezing (wheezing).    05/28/2017 at Unknown time  . budesonide-formoterol (SYMBICORT) 160-4.5 MCG/ACT inhaler Inhale 2 puffs into the lungs 2 (two) times daily as needed (shortness of breath.).    05/28/2017 at Unknown time  . cetirizine (ZYRTEC) 10 MG tablet Take 10 mg by mouth  daily.   05/28/2017 at Unknown time  . citalopram (CELEXA) 10 MG tablet Take 10 mg by mouth daily.   05/28/2017 at Unknown time  . diclofenac sodium (VOLTAREN) 1 % GEL Apply 2 g topically 4 (four) times daily as needed (apply to leg).    05/27/2017 at Unknown time  . fluticasone (FLONASE) 50 MCG/ACT nasal spray Place 2 sprays into both nostrils daily.   05/28/2017 at Unknown time  . LYRICA 25 MG capsule Take 25 mg by mouth 3 (three) times daily as needed for pain.  0 Past Month at Unknown time  . meloxicam (MOBIC) 15 MG tablet Take 15 mg by mouth daily.   05/28/2017 at Unknown time  . oxyCODONE-acetaminophen (PERCOCET/ROXICET) 5-325 MG tablet Take 1 tablet by mouth every 4 (four) hours as needed for severe pain.    05/27/2017 at Unknown time  . pantoprazole (PROTONIX) 40 MG tablet Take 40 mg by mouth 2 (two) times daily.    Past Week at Unknown time  . phentermine (ADIPEX-P) 37.5 MG tablet Take 37.5 mg by mouth daily before breakfast.   05/28/2017 at Unknown time  . tiZANidine (ZANAFLEX) 4 MG tablet Take 4 mg by mouth 2 (two) times daily.    Past Week at Unknown time  . traZODone (DESYREL) 50 MG tablet Take 50 mg by mouth at bedtime as needed for sleep (sleep).    Past Month at Unknown time  . Vitamin D, Ergocalciferol, (DRISDOL) 50000 UNITS CAPS capsule Take 50,000 Units by mouth every 7 (seven) days.   Past Week at Unknown time  . desonide (DESOWEN) 0.05 % cream Apply 1 application topically 2 (two) times daily.   03/15/2017 at Unknown time  . doxycycline (VIBRAMYCIN) 100 MG capsule Take 1 capsule (100 mg total) by mouth 2 (two) times daily. 20 capsule 0   . naproxen (NAPROSYN) 375 MG tablet Take 1 tablet (375 mg total) by mouth 2 (two) times daily. 20 tablet 0 Past Week at Unknown time  . polyethylene glycol (MIRALAX) packet Take 17 g by mouth daily as needed. 51 each 0 Past Week at Unknown time    I have reviewed patient's Past Medical Hx, Surgical Hx, Family Hx, Social Hx, medications and  allergies.  ROS:  Review of Systems  Constitutional: Negative for fever.  HENT: Negative for rhinorrhea.   Respiratory: Positive for shortness of breath and wheezing. Negative for hemoptysis and sputum production.   Cardiovascular: Negative for orthopnea and leg swelling.  Gastrointestinal: Positive for abdominal pain and diarrhea (last week). Negative for constipation, nausea and vomiting.  Musculoskeletal: Negative for myalgias.  Neurological: Negative for headaches.   Other systems negative     Physical Exam   Patient Vitals for the past 24 hrs:  BP Temp Pulse Resp SpO2 Height Weight  05/28/17 1932 125/62 98.3 F (36.8 C) 98 (!) 22 100 %  5\' 9"  (1.753 m) 253 lb (114.8 kg)   Constitutional: Well-developed, well-nourished female in no acute distress.  Cardiovascular: normal rate and rhythm, no ectopy audible, S1 & S2 heard, no murmur Respiratory: normal effort, no distress. Lungs CTAB with no wheezes or crackles GI: Abd soft, non-tender.  Nondistended.  No rebound, No guarding.  Bowel Sounds audible  MS: Extremities nontender, no edema, normal ROM Neurologic: Alert and oriented x 4.   Grossly nonfocal. GU: Neg CVAT. Skin:  Warm and Dry Psych:  Affect appropriate.  PELVIC EXAM: Cervix pink, visually closed, without lesion, small blood, vaginal walls and external genitalia normal Bimanual exam: Cervix firm, anterior, neg CMT, uterus mildly tender, somewhat enlarged and irregular (c/w fibroid), adnexa without tenderness, enlargement, or mass    Labs: Results for orders placed or performed during the hospital encounter of 05/28/17 (from the past 24 hour(s))  CBC with Differential/Platelet     Status: Abnormal   Collection Time: 05/28/17  8:44 PM  Result Value Ref Range   WBC 11.2 (H) 4.0 - 10.5 K/uL   RBC 4.45 3.87 - 5.11 MIL/uL   Hemoglobin 12.3 12.0 - 15.0 g/dL   HCT 36.8 36.0 - 46.0 %   MCV 82.7 78.0 - 100.0 fL   MCH 27.6 26.0 - 34.0 pg   MCHC 33.4 30.0 - 36.0 g/dL    RDW 14.6 11.5 - 15.5 %   Platelets 455 (H) 150 - 400 K/uL   Neutrophils Relative % 65 %   Neutro Abs 7.2 1.7 - 7.7 K/uL   Lymphocytes Relative 28 %   Lymphs Abs 3.2 0.7 - 4.0 K/uL   Monocytes Relative 6 %   Monocytes Absolute 0.7 0.1 - 1.0 K/uL   Eosinophils Relative 1 %   Eosinophils Absolute 0.1 0.0 - 0.7 K/uL   Basophils Relative 0 %   Basophils Absolute 0.0 0.0 - 0.1 K/uL  Comprehensive metabolic panel     Status: Abnormal   Collection Time: 05/28/17  8:44 PM  Result Value Ref Range   Sodium 135 135 - 145 mmol/L   Potassium 3.2 (L) 3.5 - 5.1 mmol/L   Chloride 102 101 - 111 mmol/L   CO2 21 (L) 22 - 32 mmol/L   Glucose, Bld 105 (H) 65 - 99 mg/dL   BUN 10 6 - 20 mg/dL   Creatinine, Ser 0.59 0.44 - 1.00 mg/dL   Calcium 8.6 (L) 8.9 - 10.3 mg/dL   Total Protein 7.9 6.5 - 8.1 g/dL   Albumin 3.9 3.5 - 5.0 g/dL   AST 19 15 - 41 U/L   ALT 18 14 - 54 U/L   Alkaline Phosphatase 61 38 - 126 U/L   Total Bilirubin 0.6 0.3 - 1.2 mg/dL   GFR calc non Af Amer >60 >60 mL/min   GFR calc Af Amer >60 >60 mL/min   Anion gap 12 5 - 15       Imaging:  Dg Chest 2 View  Result Date: 05/05/2017 CLINICAL DATA:  Cough and fever EXAM: CHEST  2 VIEW COMPARISON:  Chest radiograph 02/09/2016 FINDINGS: The heart size and mediastinal contours are within normal limits. Both lungs are clear. The visualized skeletal structures are unremarkable. IMPRESSION: Normal chest. Electronically Signed   By: Ulyses Jarred M.D.   On: 05/05/2017 22:55    MAU Course/MDM: I have ordered labs as follows:  See above. No leukocytosis or elevation in LFTs. K+slightly low, likely due to recent diarrhea Imaging ordered: Chest xray is negative. EKG is  normal per review by Cardiologist on call.  Results reviewed.   Consult Dr Rosana Hoes.   Treatments in MAU included Maalox and Levsin which did not help much.. Toradol brought pain from 9 to 5.  Discussed pain may be left from recent gastroenteritis.  If persists, needs to contact  primary doctor. .   Pt stable at time of discharge.  Assessment: Shortness of breath - Plan: DG Chest 2 View, DG Chest 2 View, Discharge patient  Abdominal bloating - Plan: Discharge patient, US PELVIS TRANSVANGINAL NON-OB (TV ONLY), US PELVIS (TRANSABDOMINAL ONLY)  Generalized abdominal pain - Plan: Discharge patient, US PELVIS TRANSVANGINAL NON-OB (TV ONLY), US PELVIS (TRANSABDOMINAL ONLY)  Gastroenteritis - Plan: Discharge patient  Dysmenorrhea - Plan: Discharge patient, US PELVIS TRANSVANGINAL NON-OB (TV ONLY), US PELVIS (TRANSABDOMINAL ONLY)  Uterine leiomyoma, unspecified location - Plan: Discharge patient, US PELVIS TRANSVANGINAL NON-OB (TV ONLY), US PELVIS (TRANSABDOMINAL ONLY)    Plan: Discharge home Recommend advance diet as tolerated, take protonix as directed Ordered outpatient [place] for review of fibroids with f/u at [facility]  Encouraged to return here or to other Urgent Care/ED if she develops worsening of symptoms, increase in pain, fever, or other concerning symptoms.   Hansel Feinstein CNM, MSN Certified Nurse-Midwife 05/28/2017 8:12 PM

## 2017-05-28 NOTE — MAU Note (Signed)
Had flu several wks ago. Cont to have dry cough. Burning pain in upper and mid section. Has GERD and taking medicine as prescribed. SOB since Tuesday with some tightness in chest

## 2017-05-28 NOTE — Discharge Instructions (Signed)
Abdominal Bloating When you have abdominal bloating, your abdomen may feel full, tight, or painful. It may also look bigger than normal or swollen (distended). Common causes of abdominal bloating include:  Swallowing air.  Constipation.  Problems digesting food.  Eating too much.  Irritable bowel syndrome. This is a condition that affects the large intestine.  Lactose intolerance. This is an inability to digest lactose, a natural sugar in dairy products.  Celiac disease. This is a condition that affects the ability to digest gluten, a protein found in some grains.  Gastroparesis. This is a condition that slows down the movement of food in the stomach and small intestine. It is more common in people with diabetes mellitus.  Gastroesophageal reflux disease (GERD). This is a digestive condition that makes stomach acid flow back into the esophagus.  Urinary retention. This means that the body is holding onto urine, and the bladder cannot be emptied all the way.  Follow these instructions at home: Eating and drinking  Avoid eating too much.  Try not to swallow air while talking or eating.  Avoid eating while lying down.  Avoid these foods and drinks: ? Foods that cause gas, such as broccoli, cabbage, cauliflower, and baked beans. ? Carbonated drinks. ? Hard candy. ? Chewing gum. Medicines  Take over-the-counter and prescription medicines only as told by your health care provider.  Take probiotic medicines. These medicines contain live bacteria or yeasts that can help digestion.  Take coated peppermint oil capsules. Activity  Try to exercise regularly. Exercise may help to relieve bloating that is caused by gas and relieve constipation. General instructions  Keep all follow-up visits as told by your health care provider. This is important. Contact a health care provider if:  You have nausea and vomiting.  You have diarrhea.  You have abdominal pain.  You have  unusual weight loss or weight gain.  You have severe pain, and medicines do not help. Get help right away if:  You have severe chest pain.  You have trouble breathing.  You have shortness of breath.  You have trouble urinating.  You have darker urine than normal.  You have blood in your stools or have dark, tarry stools. Summary  Abdominal bloating means that the abdomen is swollen.  Common causes of abdominal bloating are swallowing air, constipation, and problems digesting food.  Avoid eating too much and avoid swallowing air.  Avoid foods that cause gas, carbonated drinks, hard candy, and chewing gum. This information is not intended to replace advice given to you by your health care provider. Make sure you discuss any questions you have with your health care provider. Document Released: 05/01/2016 Document Revised: 05/01/2016 Document Reviewed: 05/01/2016 Elsevier Interactive Patient Education  2018 [employer].  Uterine Fibroids Uterine fibroids are tissue masses (tumors) that can develop in the womb (uterus). They are also called leiomyomas. This type of tumor is not cancerous (benign) and does not spread to other parts of the body outside of the pelvic area, which is between the hip bones. Occasionally, fibroids may develop in the fallopian tubes, in the cervix, or on the support structures (ligaments) that surround the uterus. You can have one or many fibroids. Fibroids can vary in size, weight, and where they grow in the uterus. Some can become quite large. Most fibroids do not require medical treatment. What are the causes? A fibroid can develop when a single uterine cell keeps growing (replicating). Most cells in the human body have a control mechanism  that keeps them from replicating without control. What are the signs or symptoms? Symptoms may include:  Heavy bleeding during your period.  Bleeding or spotting between periods.  Pelvic pain and  pressure.  Bladder problems, such as needing to urinate more often (urinary frequency) or urgently.  Inability to reproduce offspring (infertility).  Miscarriages.  How is this diagnosed? Uterine fibroids are diagnosed through a physical exam. Your health care provider may feel the lumpy tumors during a pelvic exam. Ultrasonography and an MRI may be done to determine the size, location, and number of fibroids. How is this treated? Treatment may include:  Watchful waiting. This involves getting the fibroid checked by your health care provider to see if it grows or shrinks. Follow your health care provider's recommendations for how often to have this checked.  Hormone medicines. These can be taken by mouth or given through an intrauterine device (IUD).  Surgery. ? Removing the fibroids (myomectomy) or the uterus (hysterectomy). ? Removing blood supply to the fibroids (uterine artery embolization).  If fibroids interfere with your fertility and you want to become pregnant, your health care provider may recommend having the fibroids removed. Follow these instructions at home:  Keep all follow-up visits as directed by your health care provider. This is important.  Take over-the-counter and prescription medicines only as told by your health care provider. ? If you were prescribed a hormone treatment, take the hormone medicines exactly as directed.  Ask your health care provider about taking iron pills and increasing the amount of dark green, leafy vegetables in your diet. These actions can help to boost your blood iron levels, which may be affected by heavy menstrual bleeding.  Pay close attention to your period and tell your health care provider about any changes, such as: ? Increased blood flow that requires you to use more pads or tampons than usual per month. ? A change in the number of days that your period lasts per month. ? A change in symptoms that are associated with your period,  such as abdominal cramping or back pain. Contact a health care provider if:  You have pelvic pain, back pain, or abdominal cramps that cannot be controlled with medicines.  You have an increase in bleeding between and during periods.  You soak tampons or pads in a half hour or less.  You feel lightheaded, extra tired, or weak. Get help right away if:  You faint.  You have a sudden increase in pelvic pain. This information is not intended to replace advice given to you by your health care provider. Make sure you discuss any questions you have with your health care provider. Document Released: 03/27/2000 Document Revised: 11/28/2015 Document Reviewed: 09/26/2013 Elsevier Interactive Patient Education  2018 [employer]. Dysmenorrhea Menstrual cramps (dysmenorrhea) are caused by the muscles of the uterus tightening (contracting) during a menstrual period. For some women, this discomfort is merely bothersome. For others, dysmenorrhea can be severe enough to interfere with everyday activities for a few days each month. Primary dysmenorrhea is menstrual cramps that last a couple of days when you start having menstrual periods or soon after. This often begins after a teenager starts having her period. As a woman gets older or has a baby, the cramps will usually lessen or disappear. Secondary dysmenorrhea begins later in life, lasts longer, and the pain may be stronger than primary dysmenorrhea. The pain may start before the period and last a few days after the period. What are the causes? Dysmenorrhea is  usually caused by an underlying problem, such as:  The tissue lining the uterus grows outside of the uterus in other areas of the body (endometriosis).  The endometrial tissue, which normally lines the uterus, is found in or grows into the muscular walls of the uterus (adenomyosis).  The pelvic blood vessels are engorged with blood just before the menstrual period (pelvic congestive  syndrome).  Overgrowth of cells (polyps) in the lining of the uterus or cervix.  Falling down of the uterus (prolapse) because of loose or stretched ligaments.  Depression.  Bladder problems, infection, or inflammation.  Problems with the intestine, a tumor, or irritable bowel syndrome.  Cancer of the female organs or bladder.  A severely tipped uterus.  A very tight opening or closed cervix.  Noncancerous tumors of the uterus (fibroids).  Pelvic inflammatory disease (PID).  Pelvic scarring (adhesions) from a previous surgery.  Ovarian cyst.  An intrauterine device (IUD) used for birth control.  What increases the risk? You may be at greater risk of dysmenorrhea if:  You are younger than age 35.  You started puberty early.  You have irregular or heavy bleeding.  You have never given birth.  You have a family history of this problem.  You are a smoker.  What are the signs or symptoms?  Cramping or throbbing pain in your lower abdomen.  Headaches.  Lower back pain.  Nausea or vomiting.  Diarrhea.  Sweating or dizziness.  Loose stools. How is this diagnosed? A diagnosis is based on your history, symptoms, physical exam, diagnostic tests, or procedures. Diagnostic tests or procedures may include:  Blood tests.  Ultrasonography.  An examination of the lining of the uterus (dilation and curettage, D&C).  An examination inside your abdomen or pelvis with a scope (laparoscopy).  X-rays.  CT scan.  MRI.  An examination inside the bladder with a scope (cystoscopy).  An examination inside the intestine or stomach with a scope (colonoscopy, gastroscopy).  How is this treated? Treatment depends on the cause of the dysmenorrhea. Treatment may include:  Pain medicine prescribed by your health care provider.  Birth control pills or an IUD with progesterone hormone in it.  Hormone replacement therapy.  Nonsteroidal anti-inflammatory drugs  (NSAIDs). These may help stop the production of prostaglandins.  Surgery to remove adhesions, endometriosis, ovarian cyst, or fibroids.  Removal of the uterus (hysterectomy).  Progesterone shots to stop the menstrual period.  Cutting the nerves on the sacrum that go to the female organs (presacral neurectomy).  Electric current to the sacral nerves (sacral nerve stimulation).  Antidepressant medicine.  Psychiatric therapy, counseling, or group therapy.  Exercise and physical therapy.  Meditation and yoga therapy.  Acupuncture.  Follow these instructions at home:  Only take over-the-counter or prescription medicines as directed by your health care provider.  Place a heating pad or hot water bottle on your lower back or abdomen. Do not sleep with the heating pad.  Use aerobic exercises, walking, swimming, biking, and other exercises to help lessen the cramping.  Massage to the lower back or abdomen may help.  Stop smoking.  Avoid alcohol and caffeine. Contact a health care provider if:  Your pain does not get better with medicine.  You have pain with sexual intercourse.  Your pain increases and is not controlled with medicines.  You have abnormal vaginal bleeding with your period.  You develop nausea or vomiting with your period that is not controlled with medicine. Get help right away if: You pass  out. This information is not intended to replace advice given to you by your health care provider. Make sure you discuss any questions you have with your health care provider. Document Released: 03/30/2005 Document Revised: 09/05/2015 Document Reviewed: 09/15/2012 Elsevier Interactive Patient Education  2017 [employer].

## 2017-05-29 ENCOUNTER — Other Ambulatory Visit: Payer: Medicaid Other

## 2017-06-08 ENCOUNTER — Telehealth: Payer: Self-pay | Admitting: General Practice

## 2017-06-08 ENCOUNTER — Ambulatory Visit (HOSPITAL_COMMUNITY)
Admission: RE | Admit: 2017-06-08 | Discharge: 2017-06-08 | Disposition: A | Discharge status: Home / Self Care | Payer: Medicaid Other | Source: Ambulatory Visit | Attending: Advanced Practice Midwife | Admitting: Advanced Practice Midwife

## 2017-06-08 ENCOUNTER — Encounter (INDEPENDENT_AMBULATORY_CARE_PROVIDER_SITE_OTHER): Payer: Self-pay

## 2017-06-08 DIAGNOSIS — D261 Other benign neoplasm of corpus uteri: Secondary | ICD-10-CM | POA: Insufficient documentation

## 2017-06-08 DIAGNOSIS — D259 Leiomyoma of uterus, unspecified: Secondary | ICD-10-CM | POA: Diagnosis not present

## 2017-06-08 DIAGNOSIS — R1084 Generalized abdominal pain: Secondary | ICD-10-CM | POA: Diagnosis not present

## 2017-06-08 DIAGNOSIS — R140 Abdominal distension (gaseous): Secondary | ICD-10-CM | POA: Insufficient documentation

## 2017-06-08 DIAGNOSIS — N946 Dysmenorrhea, unspecified: Secondary | ICD-10-CM | POA: Diagnosis not present

## 2017-06-08 NOTE — Telephone Encounter (Signed)
Called patient and informed her of ultrasound results. Patient verbalized understanding & had no questions

## 2017-06-09 ENCOUNTER — Encounter: Payer: Self-pay | Admitting: Advanced Practice Midwife

## 2017-06-09 DIAGNOSIS — D261 Other benign neoplasm of corpus uteri: Secondary | ICD-10-CM | POA: Insufficient documentation

## 2017-06-09 DIAGNOSIS — D259 Leiomyoma of uterus, unspecified: Secondary | ICD-10-CM | POA: Insufficient documentation

## 2017-07-21 ENCOUNTER — Other Ambulatory Visit (HOSPITAL_COMMUNITY)
Admission: RE | Admit: 2017-07-21 | Payer: Medicaid Other | Source: Ambulatory Visit | Admitting: Obstetrics and Gynecology

## 2017-07-21 ENCOUNTER — Ambulatory Visit: Payer: Medicaid Other | Admitting: Obstetrics and Gynecology

## 2017-07-21 ENCOUNTER — Encounter: Payer: Self-pay | Admitting: Obstetrics and Gynecology

## 2017-07-21 ENCOUNTER — Encounter: Payer: Self-pay | Admitting: *Deleted

## 2017-07-21 ENCOUNTER — Other Ambulatory Visit (HOSPITAL_COMMUNITY)
Admission: RE | Admit: 2017-07-21 | Discharge: 2017-07-21 | Disposition: A | Discharge status: Home / Self Care | Payer: Medicaid Other | Source: Ambulatory Visit | Attending: Obstetrics and Gynecology | Admitting: Obstetrics and Gynecology

## 2017-07-21 VITALS — BP 130/82 | HR 82 | Ht 69.0 in | Wt 253.0 lb

## 2017-07-21 DIAGNOSIS — D250 Submucous leiomyoma of uterus: Secondary | ICD-10-CM | POA: Diagnosis not present

## 2017-07-21 DIAGNOSIS — Z3202 Encounter for pregnancy test, result negative: Secondary | ICD-10-CM | POA: Diagnosis not present

## 2017-07-21 DIAGNOSIS — B9689 Other specified bacterial agents as the cause of diseases classified elsewhere: Secondary | ICD-10-CM | POA: Diagnosis not present

## 2017-07-21 DIAGNOSIS — Z124 Encounter for screening for malignant neoplasm of cervix: Secondary | ICD-10-CM

## 2017-07-21 DIAGNOSIS — N926 Irregular menstruation, unspecified: Secondary | ICD-10-CM | POA: Insufficient documentation

## 2017-07-21 DIAGNOSIS — N760 Acute vaginitis: Secondary | ICD-10-CM | POA: Insufficient documentation

## 2017-07-21 DIAGNOSIS — D261 Other benign neoplasm of corpus uteri: Secondary | ICD-10-CM | POA: Diagnosis not present

## 2017-07-21 LAB — POCT URINE PREGNANCY: Preg Test, Ur: NEGATIVE

## 2017-07-21 NOTE — Patient Instructions (Addendum)

## 2017-07-21 NOTE — Progress Notes (Signed)
NGYN patient presents for problem visit today to discuss issues w/ irregular periods. Per pt she did not have a cycle in Feb.  LMP: 07/15/17 Contraception : None  Per pt up to date on pap

## 2017-07-21 NOTE — Progress Notes (Signed)
Patient ID: Margaret Owens, female   DOB: 1970/04/29, 47 y.o.   MRN: [id number] Margaret Owens presents today for eval of irregular cycles. She has had irregular cycles for the last 18 months.  She reports having skipped cycles and having more than 1 cycle a month. Cycles have become longer 6-7 days long, with cramps and clots  Previously cycles monthly and last 3-5 days.  History of known uterine fibroids  GYN U/S 2/19 several small fibroids < 2 cm and possible uterine polyp  H/O chronic pain, asthma, depression, PTSD and anxiety  TSVD x 3 (largest 8 # 15 oz)  Denies social habits  Last unknown, no abnormal pap smear, no STD's, sexual active without contraception  PE AF VSS Lungs clear Heart RRR Abd soft + BS GU nl EGBUS, pap smear, obtained uterus < 10 week size non tender no masses  EMBX  Informed consent obtained. UPT negative Cervix cleaned with Betadine. Ant lip of cervix grasped with single tooth tenaculum. Pipelle passed and Bx obtained. Sample to pathology. Pt tolerated well, no complications   A/P Irregular cycles        Uterine fibroids        ? Polyp  EMBX completed without problems. Irregular cycles, fibroids and polyp reviewed with pt. Await  EMbX results.

## 2017-07-23 LAB — CYTOLOGY - PAP
Bacterial vaginitis: POSITIVE — AB
CHLAMYDIA, DNA PROBE: NEGATIVE
Candida vaginitis: NEGATIVE
DIAGNOSIS: NEGATIVE
HPV: NOT DETECTED
Neisseria Gonorrhea: NEGATIVE
TRICH (WINDOWPATH): NEGATIVE

## 2017-07-26 ENCOUNTER — Other Ambulatory Visit: Payer: Self-pay

## 2017-07-26 MED ORDER — METRONIDAZOLE 500 MG PO TABS: 500.0000 mg | ORAL_TABLET | Freq: Two times a day (BID) | ORAL | 0 refills | Status: AC

## 2017-08-04 ENCOUNTER — Emergency Department (HOSPITAL_COMMUNITY)
Admission: EM | Admit: 2017-08-04 | Discharge: 2017-08-04 | Disposition: A | Discharge status: Home / Self Care | Payer: Medicaid Other | Attending: Emergency Medicine | Admitting: Emergency Medicine

## 2017-08-04 ENCOUNTER — Encounter (HOSPITAL_COMMUNITY): Payer: Self-pay | Admitting: Emergency Medicine

## 2017-08-04 ENCOUNTER — Other Ambulatory Visit: Payer: Self-pay

## 2017-08-04 DIAGNOSIS — S59912A Unspecified injury of left forearm, initial encounter: Secondary | ICD-10-CM | POA: Diagnosis present

## 2017-08-04 DIAGNOSIS — Y929 Unspecified place or not applicable: Secondary | ICD-10-CM | POA: Insufficient documentation

## 2017-08-04 DIAGNOSIS — M79632 Pain in left forearm: Secondary | ICD-10-CM | POA: Insufficient documentation

## 2017-08-04 DIAGNOSIS — Y999 Unspecified external cause status: Secondary | ICD-10-CM | POA: Diagnosis not present

## 2017-08-04 DIAGNOSIS — Y939 Activity, unspecified: Secondary | ICD-10-CM | POA: Insufficient documentation

## 2017-08-04 DIAGNOSIS — X500XXA Overexertion from strenuous movement or load, initial encounter: Secondary | ICD-10-CM | POA: Insufficient documentation

## 2017-08-04 DIAGNOSIS — Z79899 Other long term (current) drug therapy: Secondary | ICD-10-CM | POA: Insufficient documentation

## 2017-08-04 DIAGNOSIS — M79602 Pain in left arm: Secondary | ICD-10-CM

## 2017-08-04 MED ORDER — MELOXICAM 7.5 MG PO TABS: 7.5000 mg | ORAL_TABLET | Freq: Every day | ORAL | 0 refills | Status: DC

## 2017-08-04 MED ORDER — METHOCARBAMOL 500 MG PO TABS: 500.0000 mg | ORAL_TABLET | Freq: Every evening | ORAL | 0 refills | Status: DC | PRN

## 2017-08-04 NOTE — ED Provider Notes (Signed)
[facility] DEPT Provider Note   CSN: [id number] Arrival date & time: 08/04/17  1018     History   Chief Complaint Chief Complaint  Patient presents with  . Arm Pain    HPI Margaret Owens is a 47 y.o. female presenting for evaluation of L arm pain.  Patient states 2 days ago she was carrying a heavy bucket when it started to slip and she tries to catch it with her left arm.  She felt a pop in her left elbow.  Since then she has had left arm pain, which is described as a soreness.  She states it feels like an ache that happens after you work out.  She denies numbness or tingling.  There is no one specific spot where it hurts worse, pain is of the entire elbow, forearm, and mid upper arm.  She is right-handed.  Pain is present at rest and with movement, although worse with movement and palpation.  She has taken her oxycodone without improvement of symptoms, and tried Zanaflex without improvement.  She has not tried any anti-inflammatories.  She has an orthopedic doctor, but has not followed up with him.  She denies injury elsewhere.  She denies pain in her shoulder.  She is not dropping things with her arm. It has not improved or worsened over the past 2 days.   HPI  Past Medical History:  Diagnosis Date  . Arthritis   . Asthma   . Bursitis   . GERD (gastroesophageal reflux disease)   . Hypertension   . Muscle spasms of both lower extremities   . Sciatica   . Uterine fibroid     Patient Active Problem List   Diagnosis Date Noted  . Uterine fibroid 06/09/2017  . 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 colonoscopy       OB History    Gravida  3   Para  3   Term  3   Preterm      AB      Living  3     SAB      TAB      Ectopic      Multiple      Live Births  3            Home Medications    Prior to Admission medications   Medication Sig Start Date End Date  Taking? Authorizing Provider  albuterol (PROVENTIL HFA;VENTOLIN HFA) 108 (90 BASE) MCG/ACT inhaler Inhale 2 puffs into the lungs every 6 (six) hours as needed for wheezing (wheezing).     [provider]  budesonide-formoterol (SYMBICORT) 160-4.5 MCG/ACT inhaler Inhale 2 puffs into the lungs 2 (two) times daily as needed (shortness of breath.).     [provider]  cetirizine (ZYRTEC) 10 MG tablet Take 10 mg by mouth daily.    [provider]  citalopram (CELEXA) 10 MG tablet Take 10 mg by mouth daily.    [provider]  desonide (DESOWEN) 0.05 % cream Apply 1 application topically 2 (two) times daily.    [provider]  diclofenac sodium (VOLTAREN) 1 % GEL Apply 2 g topically 4 (four) times daily as needed (apply to leg).     [provider]  fluticasone (FLONASE) 50 MCG/ACT nasal spray Place 2 sprays into both nostrils daily.    [provider]  gabapentin (NEURONTIN) 300 MG capsule  07/03/17  [provider]  LYRICA 25 MG capsule Take 25 mg by mouth 3 (three) times daily as needed for pain. 02/03/16   [provider]  meloxicam (MOBIC) 7.5 MG tablet Take 1 tablet (7.5 mg total) by mouth daily. 08/04/17   Tarrence Enck, PA-C  methocarbamol (ROBAXIN) 500 MG tablet Take 1 tablet (500 mg total) by mouth at bedtime as needed for muscle spasms. 08/04/17   Cesia Orf, PA-C  naproxen (NAPROSYN) 375 MG tablet Take 1 tablet (375 mg total) by mouth 2 (two) times daily. Patient not taking: Reported on 07/21/2017 02/04/17   Charlann Lange, PA-C  oxyCODONE-acetaminophen (PERCOCET/ROXICET) 5-325 MG tablet Take 1 tablet by mouth every 4 (four) hours as needed for severe pain.     [provider]  pantoprazole (PROTONIX) 40 MG tablet Take 40 mg by mouth 2 (two) times daily.     [provider]  phentermine (ADIPEX-P) 37.5 MG tablet Take 37.5 mg by mouth daily before breakfast.    [provider]    polyethylene glycol (MIRALAX) packet Take 17 g by mouth daily as needed. 08/13/16   Cardama, Grayce Sessions, MD  tiZANidine (ZANAFLEX) 4 MG tablet Take 4 mg by mouth 2 (two) times daily.     [provider]  traZODone (DESYREL) 50 MG tablet Take 50 mg by mouth at bedtime as needed for sleep (sleep).     [provider]  Vitamin D, Ergocalciferol, (DRISDOL) 50000 UNITS CAPS capsule Take 50,000 Units by mouth every 7 (seven) days.    [provider]    Family History Family History  Problem Relation Age of Onset  . Osteoarthritis Mother   . Breast cancer Maternal Grandmother        spread to lungs     Social History Social History   Tobacco Use  . Smoking status: Never Smoker  . Smokeless tobacco: Never Used  Substance Use Topics  . Alcohol use: No  . Drug use: No     Allergies   Latex and Penicillins   Review of Systems Review of Systems  Musculoskeletal: Positive for arthralgias and myalgias.  Neurological: Negative for numbness.  Hematological: Does not bruise/bleed easily.     Physical Exam Updated Vital Signs BP 125/77 (BP Location: Right Arm)   Pulse 91   Temp 98 F (36.7 C) (Oral)   Resp 16   Ht 5\' 9"  (1.753 m)   LMP 07/15/2017 (Exact Date) Comment: irregular   SpO2 100%   BMI 37.36 kg/m   Physical Exam  Constitutional: She is oriented to person, place, and time. She appears well-developed and well-nourished. No distress.  HENT:  Head: Normocephalic and atraumatic.  Eyes: EOM are normal.  Neck: Normal range of motion.  Pulmonary/Chest: Effort normal.  Abdominal: She exhibits no distension.  Musculoskeletal: Normal range of motion. She exhibits tenderness. She exhibits no edema or deformity.  No obvious swelling or deformity of the left elbow.  Radial pulses intact bilaterally.  Sensation intact bilaterally.  Grip strength equal bilaterally.  Tenderness to palpation of forearm, elbow, and distal upper arm.  No redness or  warmth.  No tenderness to palpation of the shoulder, full range of motion of the shoulder without pain.  Full active range of motion of the wrist and elbow with pain.  Decreased pain with passive range of motion.  Neurological: She is alert and oriented to person, place, and time. No sensory deficit.  Skin: Skin is warm. No rash noted.  Psychiatric: She has  a normal mood and affect.  Nursing note and vitals reviewed.    ED Treatments / Results  Labs (all labs ordered are listed, but only abnormal results are displayed) Labs Reviewed - No data to display  EKG None  Radiology No results found.  Procedures Procedures (including critical care time)  Medications Ordered in ED Medications - No data to display   Initial Impression / Assessment and Plan / ED Course  I have reviewed the triage vital signs and the nursing notes.  Pertinent labs & imaging results that were available during my care of the patient were reviewed by me and considered in my medical decision making (see chart for details).     Patient presented for evaluation of left arm pain.  Physical examination, no obvious neurologic deficits.  Tenderness palpation in the musculature of the arm, described as a soreness.  Likely muscle strain.  Doubt fracture, dislocation, or infection.  Discussed findings with patient.  I do not believe x-ray at this time would be beneficial.  Discussed treatment with NSAIDs, muscle relaxers, and compression.  Follow-up with orthopedics as needed.  At this time, patient appears safe for discharge.  Return cautions given.  Patient states she understands and agrees to plan.  Final Clinical Impressions(s) / ED Diagnoses   Final diagnoses:  Left arm pain    ED Discharge Orders        Ordered    meloxicam (MOBIC) 7.5 MG tablet  Daily     08/04/17 1308    methocarbamol (ROBAXIN) 500 MG tablet  At bedtime PRN     08/04/17 1308       Keinan Brouillet, PA-C 08/04/17 [place],  Shiner, DO 08/04/17 1527

## 2017-08-04 NOTE — Discharge Instructions (Signed)
Take mobic once a day with meals.  Do not take other anti-inflammatories at the same time open (Advil, Motrin, naproxen, ibuprofen, Aleve). Y You may take Robaxin as needed for muscle pain.  Have caution as this may make you tired or groggy.  Do not drive or operate heavy machinery while taking this medicine.  Do not use Tizanidine at the same time. Use ice packs or heating pads if this helps control your pain. You may use a elbow brace/sleeve to help with compression and support. If your symptoms are not improving in 1 week, follow-up with your orthopedic doctor. Return to the emergency room if you develop numbness, your hand turns white, he starts dropping things with her hand, you have any new or concerning symptoms.

## 2017-08-04 NOTE — ED Triage Notes (Signed)
Pt complaint of left arm pain at point of elbow joint; heard pop when quickly picking up drinks to avoid drinks falling. Event two days ago. Pt denies numbness, tingling, or weakness.

## 2017-09-15 ENCOUNTER — Emergency Department (HOSPITAL_COMMUNITY)
Admission: EM | Admit: 2017-09-15 | Discharge: 2017-09-15 | Disposition: A | Discharge status: Home / Self Care | Payer: Medicaid Other | Attending: Emergency Medicine | Admitting: Emergency Medicine

## 2017-09-15 ENCOUNTER — Emergency Department (HOSPITAL_COMMUNITY): Payer: Medicaid Other

## 2017-09-15 ENCOUNTER — Encounter (HOSPITAL_COMMUNITY): Payer: Self-pay | Admitting: Emergency Medicine

## 2017-09-15 DIAGNOSIS — I10 Essential (primary) hypertension: Secondary | ICD-10-CM | POA: Insufficient documentation

## 2017-09-15 DIAGNOSIS — Z79899 Other long term (current) drug therapy: Secondary | ICD-10-CM | POA: Insufficient documentation

## 2017-09-15 DIAGNOSIS — J45909 Unspecified asthma, uncomplicated: Secondary | ICD-10-CM | POA: Insufficient documentation

## 2017-09-15 DIAGNOSIS — M79602 Pain in left arm: Secondary | ICD-10-CM | POA: Insufficient documentation

## 2017-09-15 NOTE — ED Notes (Signed)
Pt verbalizes understanding of d/c instructions. Pt ambulatory at d/c with all belongings.   

## 2017-09-15 NOTE — Discharge Instructions (Addendum)
Please read attached information. If you experience any new or worsening signs or symptoms please return to the emergency room for evaluation. Please follow-up with your primary care provider or specialist as discussed.  °

## 2017-09-15 NOTE — ED Triage Notes (Signed)
Patient complains of left arm pain that started two months ago. Patient thought she had a sprain and already takes medications for chronic pain (oxycodone and gabapentin) so she went without treatment thinking it would heal on its own. Over time, the pain has gotten worse and she now has trouble bending the arm due to pain. Patient alert, oriented, and in no apparent distress at this time.

## 2017-09-15 NOTE — ED Provider Notes (Signed)
[facility] EMERGENCY DEPARTMENT Provider Note   CSN: [id number] Arrival date & time: 09/15/17  1058    History   Chief Complaint Chief Complaint  Patient presents with  . Arm Pain    HPI Laurella E Eckart is a 47 y.o. female.  HPI 47 year old female presents today with complaints of left arm pain.  Patient notes a several month history of arm pain.  She notes this originally started after lifting something heavy.  She notes this is not localized, notes that starts from the left shoulder radiates down all the way to the arm, she reports pain with range of motion about the wrist elbow and shoulder, notes tenderness to palpation, denies any swelling edema warmth to touch.  No loss of distal sensation strength and motor function.  Patient has been seen for this before without known etiology.  Patient notes taking oxycodone and muscle relaxers at home.  Past Medical History:  Diagnosis Date  . Arthritis   . Asthma   . Bursitis   . GERD (gastroesophageal reflux disease)   . Hypertension   . Muscle spasms of both lower extremities   . Sciatica   . Uterine fibroid     Patient Active Problem List   Diagnosis Date Noted  . Uterine fibroid 06/09/2017  . 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 colonoscopy       OB History    Gravida  3   Para  3   Term  3   Preterm      AB      Living  3     SAB      TAB      Ectopic      Multiple      Live Births  3            Home Medications    Prior to Admission medications   Medication Sig Start Date End Date Taking? Authorizing Provider  albuterol (PROVENTIL HFA;VENTOLIN HFA) 108 (90 BASE) MCG/ACT inhaler Inhale 2 puffs into the lungs every 6 (six) hours as needed for wheezing (wheezing).     [provider]  budesonide-formoterol (SYMBICORT) 160-4.5 MCG/ACT inhaler Inhale 2 puffs into the lungs 2 (two) times daily as needed  (shortness of breath.).     [provider]  cetirizine (ZYRTEC) 10 MG tablet Take 10 mg by mouth daily.    [provider]  citalopram (CELEXA) 10 MG tablet Take 10 mg by mouth daily.    [provider]  desonide (DESOWEN) 0.05 % cream Apply 1 application topically 2 (two) times daily.    [provider]  diclofenac sodium (VOLTAREN) 1 % GEL Apply 2 g topically 4 (four) times daily as needed (apply to leg).     [provider]  fluticasone (FLONASE) 50 MCG/ACT nasal spray Place 2 sprays into both nostrils daily.    [provider]  gabapentin (NEURONTIN) 300 MG capsule  07/03/17   [provider]  LYRICA 25 MG capsule Take 25 mg by mouth 3 (three) times daily as needed for pain. 02/03/16   [provider]  meloxicam (MOBIC) 7.5 MG tablet Take 1 tablet (7.5 mg total) by mouth daily. 08/04/17   Caccavale, Sophia, PA-C  methocarbamol (ROBAXIN) 500 MG tablet Take 1 tablet (500 mg total) by mouth at bedtime as needed for muscle spasms. 08/04/17   Caccavale, Sophia, PA-C  naproxen (NAPROSYN) 375 MG  tablet Take 1 tablet (375 mg total) by mouth 2 (two) times daily. Patient not taking: Reported on 07/21/2017 02/04/17   Charlann Lange, PA-C  oxyCODONE-acetaminophen (PERCOCET/ROXICET) 5-325 MG tablet Take 1 tablet by mouth every 4 (four) hours as needed for severe pain.     [provider]  pantoprazole (PROTONIX) 40 MG tablet Take 40 mg by mouth 2 (two) times daily.     [provider]  phentermine (ADIPEX-P) 37.5 MG tablet Take 37.5 mg by mouth daily before breakfast.    [provider]  polyethylene glycol (MIRALAX) packet Take 17 g by mouth daily as needed. 08/13/16   Cardama, Grayce Sessions, MD  tiZANidine (ZANAFLEX) 4 MG tablet Take 4 mg by mouth 2 (two) times daily.     [provider]  traZODone (DESYREL) 50 MG tablet Take 50 mg by mouth at bedtime as needed for sleep (sleep).     [provider]  Vitamin D, Ergocalciferol, (DRISDOL) 50000 UNITS CAPS capsule Take 50,000 Units by mouth every 7 (seven) days.    [provider]    Family History Family History  Problem Relation Age of Onset  . Osteoarthritis Mother   . Breast cancer Maternal Grandmother        spread to lungs     Social History Social History   Tobacco Use  . Smoking status: Never Smoker  . Smokeless tobacco: Never Used  Substance Use Topics  . Alcohol use: No  . Drug use: No     Allergies   Latex and Penicillins   Review of Systems Review of Systems  All other systems reviewed and are negative.    Physical Exam Updated Vital Signs BP 125/74 (BP Location: Right Arm)   Pulse 86   Temp 98.4 F (36.9 C) (Oral)   Resp 16   LMP 09/12/2017 (Exact Date)   SpO2 100%   Physical Exam  Constitutional: She is oriented to person, place, and time. She appears well-developed and well-nourished.  HENT:  Head: Normocephalic and atraumatic.  Eyes: Pupils are equal, round, and reactive to light. Conjunctivae are normal. Right eye exhibits no discharge. Left eye exhibits no discharge. No scleral icterus.  Neck: Normal range of motion. No JVD present. No tracheal deviation present.  Pulmonary/Chest: Effort normal. No stridor.  Musculoskeletal:  Left arm atraumatic no swelling or edema no redness or warmth to touch, sensation intact full active range of motion, grip strength 5 out of 5 radial pulse 2+, tenderness throughout the entire extremity to the shoulder no cervical thoracic spinal tenderness  Neurological: She is alert and oriented to person, place, and time. Coordination normal.  Psychiatric: She has a normal mood and affect. Her behavior is normal. Judgment and thought content normal.  Nursing note and vitals reviewed.    ED Treatments / Results  Labs (all labs ordered are listed, but only abnormal results are displayed) Labs Reviewed - No data to display  EKG None  Radiology Dg  Forearm Left  Result Date: 09/15/2017 CLINICAL DATA:  Left forearm pain for 2 months. EXAM: LEFT FOREARM - 2 VIEW COMPARISON:  None. FINDINGS: There is no evidence of fracture or other focal bone lesions. Soft tissues are unremarkable. IMPRESSION: Negative. Electronically Signed   By: Earle Gell M.D.   On: 09/15/2017 12:21   Dg Humerus Left  Result Date: 09/15/2017 CLINICAL DATA:  Left upper arm pain for 2 months. EXAM: LEFT HUMERUS - 2+ VIEW COMPARISON:  None. FINDINGS: There is no  evidence of fracture or other focal bone lesions. Soft tissues are unremarkable. IMPRESSION: Negative. Electronically Signed   By: Earle Gell M.D.   On: 09/15/2017 12:21   Dg Hand Complete Left  Result Date: 09/15/2017 CLINICAL DATA:  Left hand and thumb pain for 2 months. EXAM: LEFT HAND - COMPLETE 3+ VIEW COMPARISON:  None. FINDINGS: There is no evidence of fracture or dislocation. Mild degenerative spurring is seen at the 1st carpal-metacarpal joint, without joint space narrowing. No other osseous abnormality identified. Soft tissues are unremarkable. IMPRESSION: No acute findings. Mild degenerative spurring of the 1st carpal-metacarpal joint. Electronically Signed   By: Earle Gell M.D.   On: 09/15/2017 12:23    Procedures Procedures (including critical care time)  Medications Ordered in ED Medications - No data to display   Initial Impression / Assessment and Plan / ED Course  I have reviewed the triage vital signs and the nursing notes.  Pertinent labs & imaging results that were available during my care of the patient were reviewed by me and considered in my medical decision making (see chart for details).     47 year old female presents today with chronic arm pain, unknown etiology, no signs of infection, no radicular signs or symptoms, no trauma.  Patient encouraged to follow-up with an outpatient orthopedic.   Final Clinical Impressions(s) / ED Diagnoses   Final diagnoses:  None    ED Discharge  Orders    None       Francee Gentile 09/15/17 Alasco Nathan, MD 09/15/17 2156

## 2017-10-09 ENCOUNTER — Emergency Department (HOSPITAL_COMMUNITY)
Admission: EM | Admit: 2017-10-09 | Discharge: 2017-10-09 | Disposition: A | Discharge status: Home / Self Care | Payer: Medicaid Other | Attending: Emergency Medicine | Admitting: Emergency Medicine

## 2017-10-09 ENCOUNTER — Other Ambulatory Visit: Payer: Self-pay

## 2017-10-09 ENCOUNTER — Encounter (HOSPITAL_COMMUNITY): Payer: Self-pay | Admitting: Emergency Medicine

## 2017-10-09 DIAGNOSIS — I10 Essential (primary) hypertension: Secondary | ICD-10-CM | POA: Insufficient documentation

## 2017-10-09 DIAGNOSIS — J45909 Unspecified asthma, uncomplicated: Secondary | ICD-10-CM | POA: Insufficient documentation

## 2017-10-09 DIAGNOSIS — Z79899 Other long term (current) drug therapy: Secondary | ICD-10-CM | POA: Insufficient documentation

## 2017-10-09 DIAGNOSIS — B372 Candidiasis of skin and nail: Secondary | ICD-10-CM

## 2017-10-09 DIAGNOSIS — B373 Candidiasis of vulva and vagina: Secondary | ICD-10-CM | POA: Insufficient documentation

## 2017-10-09 LAB — WET PREP, GENITAL
Clue Cells Wet Prep HPF POC: NONE SEEN
Sperm: NONE SEEN
TRICH WET PREP: NONE SEEN
Yeast Wet Prep HPF POC: NONE SEEN

## 2017-10-09 LAB — URINALYSIS, ROUTINE W REFLEX MICROSCOPIC
BACTERIA UA: NONE SEEN
BILIRUBIN URINE: NEGATIVE
Glucose, UA: NEGATIVE mg/dL
Ketones, ur: NEGATIVE mg/dL
Leukocytes, UA: NEGATIVE
NITRITE: NEGATIVE
PROTEIN: NEGATIVE mg/dL
SPECIFIC GRAVITY, URINE: 1.015 (ref 1.005–1.030)
pH: 5 (ref 5.0–8.0)

## 2017-10-09 MED ORDER — CLOTRIMAZOLE 1 % EX CREA: TOPICAL_CREAM | CUTANEOUS | 0 refills | Status: DC

## 2017-10-09 NOTE — ED Triage Notes (Signed)
Patient has irritation on vagina area and underneath breast. Patient states she used a new body wash.

## 2017-10-09 NOTE — ED Provider Notes (Signed)
[facility] DEPT Provider Note   CSN: [id number] Arrival date & time: 10/09/17  0101     History   Chief Complaint Chief Complaint  Patient presents with  . Vaginal Itching    HPI Margaret Owens is a 47 y.o. female.  Patient presents with itching in the vaginal area, lower abdomen and under her breasts for the last several days. No pain, vaginal discharge, drainage from any area of itching or rash. She has not tried anything prior to coming in. She reports history of yeast infection.   The history is provided by the patient. No language interpreter was used.    Past Medical History:  Diagnosis Date  . Arthritis   . Asthma   . Bursitis   . GERD (gastroesophageal reflux disease)   . Hypertension   . Muscle spasms of both lower extremities   . Sciatica   . Uterine fibroid     Patient Active Problem List   Diagnosis Date Noted  . Uterine fibroid 06/09/2017  . 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 colonoscopy       OB History    Gravida  3   Para  3   Term  3   Preterm      AB      Living  3     SAB      TAB      Ectopic      Multiple      Live Births  3            Home Medications    Prior to Admission medications   Medication Sig Start Date End Date Taking? Authorizing Provider  albuterol (PROVENTIL HFA;VENTOLIN HFA) 108 (90 BASE) MCG/ACT inhaler Inhale 2 puffs into the lungs every 6 (six) hours as needed for wheezing (wheezing).     [provider]  budesonide-formoterol (SYMBICORT) 160-4.5 MCG/ACT inhaler Inhale 2 puffs into the lungs 2 (two) times daily as needed (shortness of breath.).     [provider]  cetirizine (ZYRTEC) 10 MG tablet Take 10 mg by mouth daily.    [provider]  citalopram (CELEXA) 10 MG tablet Take 10 mg by mouth daily.    [provider]  desonide (DESOWEN) 0.05 % cream Apply 1  application topically 2 (two) times daily.    [provider]  diclofenac sodium (VOLTAREN) 1 % GEL Apply 2 g topically 4 (four) times daily as needed (apply to leg).     [provider]  fluticasone (FLONASE) 50 MCG/ACT nasal spray Place 2 sprays into both nostrils daily.    [provider]  gabapentin (NEURONTIN) 300 MG capsule  07/03/17   [provider]  LYRICA 25 MG capsule Take 25 mg by mouth 3 (three) times daily as needed for pain. 02/03/16   [provider]  meloxicam (MOBIC) 7.5 MG tablet Take 1 tablet (7.5 mg total) by mouth daily. 08/04/17   Caccavale, Sophia, PA-C  methocarbamol (ROBAXIN) 500 MG tablet Take 1 tablet (500 mg total) by mouth at bedtime as needed for muscle spasms. 08/04/17   Caccavale, Sophia, PA-C  naproxen (NAPROSYN) 375 MG tablet Take 1 tablet (375 mg total) by mouth 2 (two) times daily. Patient not taking: Reported on 07/21/2017 02/04/17   Charlann Lange, PA-C  oxyCODONE-acetaminophen (PERCOCET/ROXICET) 5-325 MG tablet Take 1 tablet by mouth every 4 (four) hours as needed for severe pain.  [provider]  pantoprazole (PROTONIX) 40 MG tablet Take 40 mg by mouth 2 (two) times daily.     [provider]  phentermine (ADIPEX-P) 37.5 MG tablet Take 37.5 mg by mouth daily before breakfast.    [provider]  polyethylene glycol (MIRALAX) packet Take 17 g by mouth daily as needed. 08/13/16   Cardama, Grayce Sessions, MD  tiZANidine (ZANAFLEX) 4 MG tablet Take 4 mg by mouth 2 (two) times daily.     [provider]  traZODone (DESYREL) 50 MG tablet Take 50 mg by mouth at bedtime as needed for sleep (sleep).     [provider]  Vitamin D, Ergocalciferol, (DRISDOL) 50000 UNITS CAPS capsule Take 50,000 Units by mouth every 7 (seven) days.    [provider]    Family History Family History  Problem Relation Age of Onset  . Osteoarthritis Mother   . Breast cancer Maternal  Grandmother        spread to lungs     Social History Social History   Tobacco Use  . Smoking status: Never Smoker  . Smokeless tobacco: Never Used  Substance Use Topics  . Alcohol use: No  . Drug use: No     Allergies   Latex and Penicillins   Review of Systems Review of Systems  Constitutional: Negative for fever.  Gastrointestinal: Negative for abdominal pain.  Genitourinary: Negative for dysuria, pelvic pain and vaginal bleeding.  Musculoskeletal: Negative for myalgias.  Skin: Positive for rash.     Physical Exam Updated Vital Signs BP (!) 123/109 (BP Location: Left Arm)   Pulse 90   Temp 98.3 F (36.8 C) (Oral)   Resp 16   Ht 5\' 9"  (1.753 m)   Wt 127 kg (280 lb)   LMP 09/12/2017 (Exact Date)   SpO2 100%   BMI 41.35 kg/m   Physical Exam  Constitutional: She is oriented to person, place, and time. She appears well-developed and well-nourished.  Neck: Normal range of motion.  Pulmonary/Chest: Effort normal.  Abdominal:  Obese abdomen with large pannus.  Genitourinary:  Genitourinary Comments: Currently menstruating. No vaginal or cervical discharge present. No CMT, adnexal mass or tenderness. External vaginal area is slightly erythematous with some scaling portion of rash. Similar findings under breasts and fold of pannus.   Musculoskeletal: Normal range of motion.  Neurological: She is alert and oriented to person, place, and time.  Skin: Skin is warm and dry.     ED Treatments / Results  Labs (all labs ordered are listed, but only abnormal results are displayed) Labs Reviewed  WET PREP, GENITAL - Abnormal; Notable for the following components:      Result Value   WBC, Wet Prep HPF POC MANY (*)    All other components within normal limits  URINALYSIS, ROUTINE W REFLEX MICROSCOPIC - Abnormal; Notable for the following components:   Hgb urine dipstick MODERATE (*)    All other components within normal limits  GC/CHLAMYDIA PROBE AMP ([place])  NOT AT [facility]    EKG None  Radiology No results found.  Procedures Procedures (including critical care time)  Medications Ordered in ED Medications - No data to display   Initial Impression / Assessment and Plan / ED Course  I have reviewed the triage vital signs and the nursing notes.  Pertinent labs & imaging results that were available during my care of the patient were reviewed by me and considered in my medical decision making (see chart for details).  Patient here for evaluation of itching with rash/skin findings c/w candidal infection. Clotrimazole provided. She reports she has not PCP follow up. Resources provided.  Final Clinical Impressions(s) / ED Diagnoses   Final diagnoses:  None   1. Candidal yeast infection  ED Discharge Orders    None       Charlann Lange, PA-C 10/09/17 [place], [place], MD 10/09/17 [phone]

## 2017-10-11 LAB — GC/CHLAMYDIA PROBE AMP (~~LOC~~) NOT AT ARMC
Chlamydia: NEGATIVE
Neisseria Gonorrhea: NEGATIVE

## 2017-12-01 ENCOUNTER — Inpatient Hospital Stay (HOSPITAL_COMMUNITY)
Admission: AD | Admit: 2017-12-01 | Discharge: 2017-12-01 | Disposition: A | Discharge status: Home / Self Care | Payer: Medicaid Other | Source: Ambulatory Visit | Attending: Obstetrics and Gynecology | Admitting: Obstetrics and Gynecology

## 2017-12-01 ENCOUNTER — Encounter (HOSPITAL_COMMUNITY): Payer: Self-pay | Admitting: *Deleted

## 2017-12-01 DIAGNOSIS — R109 Unspecified abdominal pain: Secondary | ICD-10-CM | POA: Diagnosis present

## 2017-12-01 DIAGNOSIS — Z880 Allergy status to penicillin: Secondary | ICD-10-CM | POA: Insufficient documentation

## 2017-12-01 DIAGNOSIS — R1084 Generalized abdominal pain: Secondary | ICD-10-CM | POA: Insufficient documentation

## 2017-12-01 DIAGNOSIS — Z5989 Other problems related to housing and economic circumstances: Secondary | ICD-10-CM

## 2017-12-01 DIAGNOSIS — D259 Leiomyoma of uterus, unspecified: Secondary | ICD-10-CM | POA: Insufficient documentation

## 2017-12-01 DIAGNOSIS — Z598 Other problems related to housing and economic circumstances: Secondary | ICD-10-CM

## 2017-12-01 HISTORY — DX: Depression, unspecified: F32.A

## 2017-12-01 HISTORY — DX: Major depressive disorder, single episode, unspecified: F32.9

## 2017-12-01 HISTORY — DX: Anxiety disorder, unspecified: F41.9

## 2017-12-01 HISTORY — DX: Post-traumatic stress disorder, unspecified: F43.10

## 2017-12-01 LAB — CBC
HEMATOCRIT: 37.9 % (ref 36.0–46.0)
HEMOGLOBIN: 12.2 g/dL (ref 12.0–15.0)
MCH: 27.2 pg (ref 26.0–34.0)
MCHC: 32.2 g/dL (ref 30.0–36.0)
MCV: 84.4 fL (ref 78.0–100.0)
Platelets: 429 10*3/uL — ABNORMAL HIGH (ref 150–400)
RBC: 4.49 MIL/uL (ref 3.87–5.11)
RDW: 13.9 % (ref 11.5–15.5)
WBC: 7.3 10*3/uL (ref 4.0–10.5)

## 2017-12-01 LAB — COMPREHENSIVE METABOLIC PANEL
ALBUMIN: 3.9 g/dL (ref 3.5–5.0)
ALK PHOS: 70 U/L (ref 38–126)
ALT: 15 U/L (ref 0–44)
ANION GAP: 10 (ref 5–15)
AST: 18 U/L (ref 15–41)
BILIRUBIN TOTAL: 0.4 mg/dL (ref 0.3–1.2)
BUN: 10 mg/dL (ref 6–20)
CALCIUM: 9.1 mg/dL (ref 8.9–10.3)
CO2: 25 mmol/L (ref 22–32)
Chloride: 101 mmol/L (ref 98–111)
Creatinine, Ser: 0.61 mg/dL (ref 0.44–1.00)
GFR calc Af Amer: >60 mL/min (ref >60)
Glucose, Bld: 98 mg/dL (ref 70–99)
Potassium: 3.8 mmol/L (ref 3.5–5.1)
Sodium: 136 mmol/L (ref 135–145)
TOTAL PROTEIN: 7.9 g/dL (ref 6.5–8.1)

## 2017-12-01 LAB — URINALYSIS, ROUTINE W REFLEX MICROSCOPIC
BILIRUBIN URINE: NEGATIVE
GLUCOSE, UA: NEGATIVE mg/dL
Hgb urine dipstick: NEGATIVE
Ketones, ur: NEGATIVE mg/dL
Nitrite: NEGATIVE
PH: 6 (ref 5.0–8.0)
Protein, ur: NEGATIVE mg/dL
SPECIFIC GRAVITY, URINE: 1.023 (ref 1.005–1.030)

## 2017-12-01 LAB — POCT PREGNANCY, URINE: PREG TEST UR: NEGATIVE

## 2017-12-01 MED ORDER — MELOXICAM 7.5 MG PO TABS: 7.5000 mg | ORAL_TABLET | Freq: Every day | ORAL | 0 refills | Status: DC

## 2017-12-01 MED ORDER — KETOROLAC TROMETHAMINE 60 MG/2ML IM SOLN
60.0000 mg | Freq: Once | INTRAMUSCULAR | Status: AC
  Administered 2017-12-01: 60 mg via INTRAMUSCULAR
  Filled 2017-12-01: qty 2

## 2017-12-01 MED ORDER — METHOCARBAMOL 500 MG PO TABS: 500.0000 mg | ORAL_TABLET | Freq: Every evening | ORAL | 0 refills | Status: DC | PRN

## 2017-12-01 NOTE — [facility] Note (Signed)
Pt reports she has a history of fibroids and started her period last Monday and had lower abd pain that she thought was related to her period but the pain has continued and is worsening.

## 2017-12-01 NOTE — Discharge Instructions (Signed)

## 2017-12-01 NOTE — Progress Notes (Signed)
Attempted to call Laverta Baltimore x2 at [phone] to discuss insurance help for patient, but unable to reach anyone on this number. Spoke with Willette Cluster in case management.  She will get attempt to contact her and also plans to come to [facility] to speak with patient about medications and insurance questions.

## 2017-12-01 NOTE — MAU Provider Note (Signed)
Chief Complaint: Abdominal Pain and Back Pain   First Provider Initiated Contact with Patient 12/01/17 1243     SUBJECTIVE HPI: Margaret Owens is a 47 y.o. female who presents to [facility] reporting abdominal pain.  Denies fever/chills, n/v/d, constipation, vaginal discharge. Hx of dysmenorrhea, started period on Monday. States initially thought pain was d/t her menses but now concerned pain is d/t change in her meds. Patient has had issues with her insurance since April. Hasn't been able to see her PCP since April so has had not refills for her medications.   Location: generalized abdominal pain, worse in pelvis Quality: cramping Severity: 10/10 on pain scale Duration: 3 weeks Timing: intermittent Modifying factors: none. Took tylenol without relief.  Associated signs and symptoms: none  Past Medical History:  Diagnosis Date  . Anxiety   . Arthritis   . Asthma   . Bursitis   . Depression   . GERD (gastroesophageal reflux disease)   . Hypertension   . Muscle spasms of both lower extremities   . PTSD (post-traumatic stress disorder)   . Sciatica   . Uterine fibroid    OB History  Gravida Para Term Preterm AB Living  3 3 3     3   SAB TAB Ectopic Multiple Live Births  0 0 0 0 3    # Outcome Date GA Lbr Len/2nd Weight Sex Delivery Anes PTL Lv  3 Term     M Vag-Spont   LIV  2 Term     F Vag-Spont   LIV  1 Term     M Vag-Spont   LIV   Past Surgical History:  Procedure Laterality Date  . colonoscopy     Social History   Socioeconomic History  . Marital status: Single    Spouse name: Not on file  . Number of children: Not on file  . Years of education: Not on file  . Highest education level: Not on file  Occupational History  . Not on file  Social Needs  . Financial resource strain: Not on file  . Food insecurity:    Worry: Not on file    Inability: Not on file  . Transportation needs:    Medical: Not on file    Non-medical: Not on file  Tobacco Use   . Smoking status: Never Smoker  . Smokeless tobacco: Never Used  Substance and Sexual Activity  . Alcohol use: Yes    Comment: 1/week  . Drug use: No  . Sexual activity: Yes    Partners: Male    Birth control/protection: None  Lifestyle  . Physical activity:    Days per week: Not on file    Minutes per session: Not on file  . Stress: Not on file  Relationships  . Social connections:    Talks on phone: Not on file    Gets together: Not on file    Attends religious service: Not on file    Active member of club or organization: Not on file    Attends meetings of clubs or organizations: Not on file    Relationship status: Not on file  . Intimate partner violence:    Fear of current or ex partner: Not on file    Emotionally abused: Not on file    Physically abused: Not on file    Forced sexual activity: Not on file  Other Topics Concern  . Not on file  Social History Narrative  . Not on file  Family History  Problem Relation Age of Onset  . Osteoarthritis Mother   . Breast cancer Maternal Grandmother        spread to lungs    No current facility-administered medications on file prior to encounter.    Current Outpatient Medications on File Prior to Encounter  Medication Sig Dispense Refill  . albuterol (PROVENTIL HFA;VENTOLIN HFA) 108 (90 BASE) MCG/ACT inhaler Inhale 2 puffs into the lungs every 6 (six) hours as needed for wheezing (wheezing).     . budesonide-formoterol (SYMBICORT) 160-4.5 MCG/ACT inhaler Inhale 2 puffs into the lungs 2 (two) times daily as needed (shortness of breath.).     Marland Kitchen cetirizine (ZYRTEC) 10 MG tablet Take 10 mg by mouth daily.    . citalopram (CELEXA) 10 MG tablet Take 10 mg by mouth daily.    . clotrimazole (LOTRIMIN) 1 % cream Apply to affected area 2 times daily 30 g 0  . desonide (DESOWEN) 0.05 % cream Apply 1 application topically 2 (two) times daily.    . diclofenac sodium (VOLTAREN) 1 % GEL Apply 2 g topically 4 (four) times daily as  needed (apply to leg).     . fluticasone (FLONASE) 50 MCG/ACT nasal spray Place 2 sprays into both nostrils daily.    Marland Kitchen gabapentin (NEURONTIN) 300 MG capsule   0  . LYRICA 25 MG capsule Take 25 mg by mouth 3 (three) times daily as needed for pain.  0  . pantoprazole (PROTONIX) 40 MG tablet Take 40 mg by mouth 2 (two) times daily.     . polyethylene glycol (MIRALAX) packet Take 17 g by mouth daily as needed. 14 each 0  . tiZANidine (ZANAFLEX) 4 MG tablet Take 4 mg by mouth 2 (two) times daily.     . traZODone (DESYREL) 50 MG tablet Take 50 mg by mouth at bedtime as needed for sleep (sleep).     . Vitamin D, Ergocalciferol, (DRISDOL) 50000 UNITS CAPS capsule Take 50,000 Units by mouth every 7 (seven) days.     Allergies  Allergen Reactions  . Latex Hives  . Penicillins Hives    Has patient had a PCN reaction causing immediate rash, facial/tongue/throat swelling, SOB or lightheadedness with hypotension: No Has patient had a PCN reaction causing severe rash involving mucus membranes or skin necrosis: No Has patient had a PCN reaction that required hospitalization No Has patient had a PCN reaction occurring within the last 10 years: No If all of the above answers are "NO", then may proceed with Cephalosporin use. Has patient had a PCN reaction causing immediate rash, facial/tongue/throat swelling, SOB or lightheadedness with hypotension: No Has patient had a PCN reaction causing severe rash involving mucus membranes or skin necrosis: No Has patient had a PCN reaction that required hospitalization No Has patient had a PCN reaction occurring within the last 10 years: No If all of the above answers are "NO", then may proceed with Cephalosporin use.    I have reviewed patient's Past Medical Hx, Surgical Hx, Family Hx, Social Hx, medications and allergies.   Review of Systems  Constitutional: Negative.   Gastrointestinal: Positive for abdominal pain. Negative for diarrhea, nausea and vomiting.   Genitourinary: Negative.   Musculoskeletal: Positive for back pain.    OBJECTIVE Patient Vitals for the past 24 hrs:  BP Temp Temp src Pulse Resp SpO2 Height Weight  12/01/17 1421 132/62 97.7 F (36.5 C) Oral 73 18 - - -  12/01/17 1219 133/78 98.4 F (36.9 C) Oral 90 18  98 % 5\' 7"  (1.702 m) 116.6 kg   Constitutional: Well-developed, well-nourished female in no acute distress.  Cardiovascular: normal rate & rhythm, no murmur Respiratory: normal rate and effort. Lung sounds clear throughout GI: Abd soft, non-tender, Pos BS x 4. No guarding or rebound tenderness MS: Extremities nontender, no edema, normal ROM Neurologic: Alert and oriented x 4.     LAB RESULTS Results for orders placed or performed during the hospital encounter of 12/01/17 (from the past 24 hour(s))  Urinalysis, Routine w reflex microscopic     Status: Abnormal   Collection Time: 12/01/17 12:25 PM  Result Value Ref Range   Color, Urine YELLOW YELLOW   APPearance HAZY (A) CLEAR   Specific Gravity, Urine 1.023 1.005 - 1.030   pH 6.0 5.0 - 8.0   Glucose, UA NEGATIVE NEGATIVE mg/dL   Hgb urine dipstick NEGATIVE NEGATIVE   Bilirubin Urine NEGATIVE NEGATIVE   Ketones, ur NEGATIVE NEGATIVE mg/dL   Protein, ur NEGATIVE NEGATIVE mg/dL   Nitrite NEGATIVE NEGATIVE   Leukocytes, UA TRACE (A) NEGATIVE   RBC / HPF 0-5 0 - 5 RBC/hpf   WBC, UA 11-20 0 - 5 WBC/hpf   Bacteria, UA RARE (A) NONE SEEN   Squamous Epithelial / LPF 0-5 0 - 5   Mucus PRESENT   Pregnancy, urine POC     Status: None   Collection Time: 12/01/17 12:27 PM  Result Value Ref Range   Preg Test, Ur NEGATIVE NEGATIVE  CBC     Status: Abnormal   Collection Time: 12/01/17  1:19 PM  Result Value Ref Range   WBC 7.3 4.0 - 10.5 K/uL   RBC 4.49 3.87 - 5.11 MIL/uL   Hemoglobin 12.2 12.0 - 15.0 g/dL   HCT 37.9 36.0 - 46.0 %   MCV 84.4 78.0 - 100.0 fL   MCH 27.2 26.0 - 34.0 pg   MCHC 32.2 30.0 - 36.0 g/dL   RDW 13.9 11.5 - 15.5 %   Platelets 429 (H) 150 -  400 K/uL  Comprehensive metabolic panel     Status: None   Collection Time: 12/01/17  1:19 PM  Result Value Ref Range   Sodium 136 135 - 145 mmol/L   Potassium 3.8 3.5 - 5.1 mmol/L   Chloride 101 98 - 111 mmol/L   CO2 25 22 - 32 mmol/L   Glucose, Bld 98 70 - 99 mg/dL   BUN 10 6 - 20 mg/dL   Creatinine, Ser 0.61 0.44 - 1.00 mg/dL   Calcium 9.1 8.9 - 10.3 mg/dL   Total Protein 7.9 6.5 - 8.1 g/dL   Albumin 3.9 3.5 - 5.0 g/dL   AST 18 15 - 41 U/L   ALT 15 0 - 44 U/L   Alkaline Phosphatase 70 38 - 126 U/L   Total Bilirubin 0.4 0.3 - 1.2 mg/dL   GFR calc non Af Amer >60 >60 mL/min   GFR calc Af Amer >60 >60 mL/min   Anion gap 10 5 - 15    IMAGING No results found.  MAU COURSE Orders Placed This Encounter  Procedures  . Urinalysis, Routine w reflex microscopic  . CBC  . Comprehensive metabolic panel  . Pregnancy, urine POC  . Discharge patient   Meds ordered this encounter  Medications  . ketorolac (TORADOL) injection 60 mg  . meloxicam (MOBIC) 7.5 MG tablet    Sig: Take 1 tablet (7.5 mg total) by mouth daily.    Dispense:  30 tablet  Refill:  0    Order Specific Question:   Supervising Provider    Answer:   Aletha Halim [id number]  . methocarbamol (ROBAXIN) 500 MG tablet    Sig: Take 1 tablet (500 mg total) by mouth at bedtime as needed for muscle spasms.    Dispense:  30 tablet    Refill:  0    Order Specific Question:   Supervising Provider    Answer:   Aletha Halim [[id number]]    MDM UPT negative VSS Toradol 60 mg IM with improvement in symptoms.  Seen by care management (see note) for assistance with f/u and medications.  Pt requesting to be discharged home. Will refill some of her meds. Arranged for f/u with [facility] in 2 weeks.   ASSESSMENT 1. Abdominal pain in female   2. Insurance coverage problems     PLAN Discharge home in stable condition.  Follow-up [facility] Follow up.    Why:  follow up appointment is September 6th at 9:30 am with Dr. Antony Blackbird Contact information: [address] [place] [address] [phone]         Allergies as of 12/01/2017      Reactions   Latex Hives   Penicillins Hives   Has patient had a PCN reaction causing immediate rash, facial/tongue/throat swelling, SOB or lightheadedness with hypotension: No Has patient had a PCN reaction causing severe rash involving mucus membranes or skin necrosis: No Has patient had a PCN reaction that required hospitalization No Has patient had a PCN reaction occurring within the last 10 years: No If all of the above answers are "NO", then may proceed with Cephalosporin use. Has patient had a PCN reaction causing immediate rash, facial/tongue/throat swelling, SOB or lightheadedness with hypotension: No Has patient had a PCN reaction causing severe rash involving mucus membranes or skin necrosis: No Has patient had a PCN reaction that required hospitalization No Has patient had a PCN reaction occurring within the last 10 years: No If all of the above answers are "NO", then may proceed with Cephalosporin use.      Medication List    STOP taking these medications   naproxen 375 MG tablet Commonly known as:  NAPROSYN   oxyCODONE-acetaminophen 5-325 MG tablet Commonly known as:  PERCOCET/ROXICET   phentermine 37.5 MG tablet Commonly known as:  ADIPEX-P     TAKE these medications   albuterol 108 (90 Base) MCG/ACT inhaler Commonly known as:  PROVENTIL HFA;VENTOLIN HFA Inhale 2 puffs into the lungs every 6 (six) hours as needed for wheezing (wheezing).   budesonide-formoterol 160-4.5 MCG/ACT inhaler Commonly known as:  SYMBICORT Inhale 2 puffs into the lungs 2 (two) times daily as needed (shortness of breath.).   cetirizine 10 MG tablet Commonly known as:  ZYRTEC Take 10 mg by mouth daily.   citalopram 10 MG tablet Commonly known as:  CELEXA Take 10 mg by  mouth daily.   clotrimazole 1 % cream Commonly known as:  LOTRIMIN Apply to affected area 2 times daily   desonide 0.05 % cream Commonly known as:  DESOWEN Apply 1 application topically 2 (two) times daily.   diclofenac sodium 1 % Gel Commonly known as:  VOLTAREN Apply 2 g topically 4 (four) times daily as needed (apply to leg).   fluticasone 50 MCG/ACT nasal spray Commonly known as:  FLONASE Place 2 sprays into both nostrils daily.   gabapentin 300 MG capsule Commonly  known as:  NEURONTIN   LYRICA 25 MG capsule Generic drug:  pregabalin Take 25 mg by mouth 3 (three) times daily as needed for pain.   meloxicam 7.5 MG tablet Commonly known as:  MOBIC Take 1 tablet (7.5 mg total) by mouth daily.   methocarbamol 500 MG tablet Commonly known as:  ROBAXIN Take 1 tablet (500 mg total) by mouth at bedtime as needed for muscle spasms.   pantoprazole 40 MG tablet Commonly known as:  PROTONIX Take 40 mg by mouth 2 (two) times daily.   traZODone 50 MG tablet Commonly known as:  DESYREL Take 50 mg by mouth at bedtime as needed for sleep (sleep).   Vitamin D (Ergocalciferol) 50000 units Caps capsule Commonly known as:  DRISDOL Take 50,000 Units by mouth every 7 (seven) days.     ASK your doctor about these medications   polyethylene glycol packet Commonly known as:  MIRALAX / GLYCOLAX Take 17 g by mouth daily as needed.   tiZANidine 4 MG tablet Commonly known as:  ZANAFLEX Take 4 mg by mouth 2 (two) times daily.        Jorje Guild, NP 12/01/2017  7:01 PM

## 2017-12-01 NOTE — Care Management Note (Signed)
Case Management Note  Patient Details  Name: Margaret Owens MRN: [id number] Date of Birth: [birth year]  Subjective/Objective:             Abdominal pain       Action/Plan: PCP- follow up appointment made for [facility] 12/17/17 at 85 am.   In-House Referral:  Financial Counselor  Discharge planning Services  CM Consult       Status of Service:   completed    Additional Comments: CM received call from RN that patient did not have insurance and questions regarding that.  CM called [facility], community and verified that patient did not have Medicaid since 4/19 and only has family planning medicaid- which only pays for birth control pills.  Patient has had a PCP- of Dr. Nolene Ebbs but stated she has not seen him since April b/c the cost each visit  is 95$ with no insurance.  Patient desired to see someone that was cheaper.  CM with patient's permission called [facility]- # [phone] - [place]. [phone] and made an appointment for 12/17/17 at 9:30 am with Dr. Antony Blackbird.  Appointment placed in [facility] and given to patient.  Patient can get her prescriptions the 1st time for free there either when she is discharged from [facility] or her 1st visit there , then she will need to apply for a program through their clinic.  Their hours are 0845-5:15pm.  Patient informed that she would not be able to get narcotics filled there.  Patient verbalized understanding and was in agreement with plan.  CM called and requested Despina Pole - FC to come and see patient regarding medicaid application process and she plans to see patient while in MAU.  No other needs noted by CM.  Yong Channel, RN 12/01/2017, 2:00 PM

## 2017-12-01 NOTE — MAU Provider Note (Signed)
History   Margaret Owens is a 47 year-old G3P3 who presents with abdominal pain. She reports that she first noticed the pain about 3 weeks ago and that it has been getting consistently worse since. She describes the pain as "all of the above" when asked to describe the pain as sharp, dull, or aching. She rates the pain "12" out of 10. She originally thought that her pain may be related to her menstrual cycle but her period has been over for a few days now and the pain persists. She also reports back pain and hip pain, but thinks these may be related to chronic sciatica and bursitis. Her abdominal pain has been unresponsive to Tylenol. She denies pain with urination or blood in her urine. She reports nausea and vomiting with last episode of vomiting last week. She reports one bout of diarrhea a few days ago but otherwise normal bowel movements with no blood.  She reports no new sexual partners. She is sexually active with one female partner who she has been with for several months. She reports a remote history of Chlamydia that was treated.   She reports that her chronic medical problems are uterine fibroids, sciatica, and hip bursitis. She reports taking Lyrica consistently.  CSN: [id number]  Arrival date and time: 12/01/17 1209   First Provider Initiated Contact with Patient 12/01/17 1243      Chief Complaint  Patient presents with  . Abdominal Pain  . Back Pain   HPI  OB History    Gravida  3   Para  3   Term  3   Preterm      AB      Living  3     SAB  0   TAB  0   Ectopic  0   Multiple  0   Live Births  3           Past Medical History:  Diagnosis Date  . Anxiety   . Arthritis   . Asthma   . Bursitis   . Depression   . GERD (gastroesophageal reflux disease)   . Hypertension   . Muscle spasms of both lower extremities   . PTSD (post-traumatic stress disorder)   . Sciatica   . Uterine fibroid     Past Surgical History:  Procedure Laterality Date  .  colonoscopy      Family History  Problem Relation Age of Onset  . Osteoarthritis Mother   . Breast cancer Maternal Grandmother        spread to lungs     Social History   Tobacco Use  . Smoking status: Never Smoker  . Smokeless tobacco: Never Used  Substance Use Topics  . Alcohol use: Yes    Comment: 1/week  . Drug use: No    Allergies:  Allergies  Allergen Reactions  . Latex Hives  . Penicillins Hives    Has patient had a PCN reaction causing immediate rash, facial/tongue/throat swelling, SOB or lightheadedness with hypotension: No Has patient had a PCN reaction causing severe rash involving mucus membranes or skin necrosis: No Has patient had a PCN reaction that required hospitalization No Has patient had a PCN reaction occurring within the last 10 years: No If all of the above answers are "NO", then may proceed with Cephalosporin use. Has patient had a PCN reaction causing immediate rash, facial/tongue/throat swelling, SOB or lightheadedness with hypotension: No Has patient had a PCN reaction causing severe rash involving mucus membranes or  skin necrosis: No Has patient had a PCN reaction that required hospitalization No Has patient had a PCN reaction occurring within the last 10 years: No If all of the above answers are "NO", then may proceed with Cephalosporin use.    Medications Prior to Admission  Medication Sig Dispense Refill Last Dose  . albuterol (PROVENTIL HFA;VENTOLIN HFA) 108 (90 BASE) MCG/ACT inhaler Inhale 2 puffs into the lungs every 6 (six) hours as needed for wheezing (wheezing).    Taking  . budesonide-formoterol (SYMBICORT) 160-4.5 MCG/ACT inhaler Inhale 2 puffs into the lungs 2 (two) times daily as needed (shortness of breath.).    Taking  . cetirizine (ZYRTEC) 10 MG tablet Take 10 mg by mouth daily.   Taking  . citalopram (CELEXA) 10 MG tablet Take 10 mg by mouth daily.   Taking  . clotrimazole (LOTRIMIN) 1 % cream Apply to affected area 2 times  daily 30 g 0   . desonide (DESOWEN) 0.05 % cream Apply 1 application topically 2 (two) times daily.   Taking  . diclofenac sodium (VOLTAREN) 1 % GEL Apply 2 g topically 4 (four) times daily as needed (apply to leg).    Taking  . fluticasone (FLONASE) 50 MCG/ACT nasal spray Place 2 sprays into both nostrils daily.   Taking  . gabapentin (NEURONTIN) 300 MG capsule   0 Taking  . LYRICA 25 MG capsule Take 25 mg by mouth 3 (three) times daily as needed for pain.  0 Not Taking  . meloxicam (MOBIC) 7.5 MG tablet Take 1 tablet (7.5 mg total) by mouth daily. 30 tablet 0   . methocarbamol (ROBAXIN) 500 MG tablet Take 1 tablet (500 mg total) by mouth at bedtime as needed for muscle spasms. 8 tablet 0   . naproxen (NAPROSYN) 375 MG tablet Take 1 tablet (375 mg total) by mouth 2 (two) times daily. (Patient not taking: Reported on 07/21/2017) 20 tablet 0 Not Taking  . oxyCODONE-acetaminophen (PERCOCET/ROXICET) 5-325 MG tablet Take 1 tablet by mouth every 4 (four) hours as needed for severe pain.    Taking  . pantoprazole (PROTONIX) 40 MG tablet Take 40 mg by mouth 2 (two) times daily.    Taking  . phentermine (ADIPEX-P) 37.5 MG tablet Take 37.5 mg by mouth daily before breakfast.   Taking  . polyethylene glycol (MIRALAX) packet Take 17 g by mouth daily as needed. 14 each 0 Taking  . tiZANidine (ZANAFLEX) 4 MG tablet Take 4 mg by mouth 2 (two) times daily.    Taking  . traZODone (DESYREL) 50 MG tablet Take 50 mg by mouth at bedtime as needed for sleep (sleep).    Taking  . Vitamin D, Ergocalciferol, (DRISDOL) 50000 UNITS CAPS capsule Take 50,000 Units by mouth every 7 (seven) days.   Taking    Review of Systems  Gastrointestinal: Positive for abdominal pain, nausea and vomiting. Negative for blood in stool and constipation.  Genitourinary: Negative for difficulty urinating, dysuria and hematuria.   Physical Exam   Blood pressure 133/78, pulse 90, temperature 98.4 F (36.9 C), temperature source Oral, resp.  rate 18, height 5\' 7"  (1.702 m), weight 116.6 kg, last menstrual period 11/22/2017, SpO2 98 %.  Physical Exam  Constitutional: No distress.  HENT:  Head: Normocephalic.  Cardiovascular: Normal rate, regular rhythm and normal heart sounds.  Respiratory: Effort normal. No respiratory distress. She has no wheezes.  GI: Soft. Bowel sounds are normal. There is no rebound and no guarding.  Generalized tenderness to deep palpation  throughout abdomen. Negative Rovsing.  MAU Course  Procedures  MDM UA and Urine pregnancy ordered, revealed no pregnancy and unremarkable UA.  Thorough abdominal exam completed, not concerning for acute abdomen. CBC, CMP ordered to r/o acute infectious process or electrolyte abnormality, results were unremarkable.  Assessment and Plan  Margaret Owens is a 47 year-old G3P3 who presents for abdominal pain. Most likely that pain is related to uterine fibroids, gas, or constipation. Exam not concerning for acute abdomen. Labs reassuring.  Abdominal Pain - UA (unremarkable) - Urine Pregnancy (negative) - CMP, CBC (unremarkable) - Ketorolac 60mg  IM one-time dose given - Refilled - Recommended outpatient follow-up with PCP  Margaret Owens, MS3 12/01/2017, 1:05 PM

## 2017-12-01 NOTE — Progress Notes (Signed)
[place] with CSW about patient's concerns regarding insurance and medications.  Given names and numbers of case manager and insurance person who could come speak with her.  Will call them and see if they can come see patient.

## 2017-12-17 ENCOUNTER — Ambulatory Visit: Payer: Self-pay | Attending: Family Medicine | Admitting: Licensed Clinical Social Worker

## 2017-12-17 ENCOUNTER — Encounter: Payer: Self-pay | Admitting: Family Medicine

## 2017-12-17 ENCOUNTER — Ambulatory Visit: Payer: Medicaid Other | Attending: Family Medicine | Admitting: Family Medicine

## 2017-12-17 VITALS — BP 117/77 | HR 89 | Temp 98.0°F | Ht 69.0 in | Wt 257.6 lb

## 2017-12-17 DIAGNOSIS — F329 Major depressive disorder, single episode, unspecified: Secondary | ICD-10-CM

## 2017-12-17 DIAGNOSIS — F4310 Post-traumatic stress disorder, unspecified: Secondary | ICD-10-CM | POA: Insufficient documentation

## 2017-12-17 DIAGNOSIS — M5441 Lumbago with sciatica, right side: Secondary | ICD-10-CM

## 2017-12-17 DIAGNOSIS — R319 Hematuria, unspecified: Secondary | ICD-10-CM

## 2017-12-17 DIAGNOSIS — D649 Anemia, unspecified: Secondary | ICD-10-CM | POA: Diagnosis not present

## 2017-12-17 DIAGNOSIS — I10 Essential (primary) hypertension: Secondary | ICD-10-CM | POA: Diagnosis not present

## 2017-12-17 DIAGNOSIS — J309 Allergic rhinitis, unspecified: Secondary | ICD-10-CM

## 2017-12-17 DIAGNOSIS — G8929 Other chronic pain: Secondary | ICD-10-CM | POA: Diagnosis not present

## 2017-12-17 DIAGNOSIS — Z803 Family history of malignant neoplasm of breast: Secondary | ICD-10-CM | POA: Insufficient documentation

## 2017-12-17 DIAGNOSIS — F32A Depression, unspecified: Secondary | ICD-10-CM

## 2017-12-17 DIAGNOSIS — K219 Gastro-esophageal reflux disease without esophagitis: Secondary | ICD-10-CM | POA: Diagnosis not present

## 2017-12-17 DIAGNOSIS — M5442 Lumbago with sciatica, left side: Secondary | ICD-10-CM

## 2017-12-17 DIAGNOSIS — F419 Anxiety disorder, unspecified: Principal | ICD-10-CM

## 2017-12-17 DIAGNOSIS — Z79899 Other long term (current) drug therapy: Secondary | ICD-10-CM

## 2017-12-17 DIAGNOSIS — R1030 Lower abdominal pain, unspecified: Secondary | ICD-10-CM

## 2017-12-17 DIAGNOSIS — Z8262 Family history of osteoporosis: Secondary | ICD-10-CM | POA: Diagnosis not present

## 2017-12-17 DIAGNOSIS — Z880 Allergy status to penicillin: Secondary | ICD-10-CM | POA: Diagnosis not present

## 2017-12-17 DIAGNOSIS — J4520 Mild intermittent asthma, uncomplicated: Secondary | ICD-10-CM

## 2017-12-17 DIAGNOSIS — Z91040 Latex allergy status: Secondary | ICD-10-CM | POA: Diagnosis not present

## 2017-12-17 LAB — POCT URINALYSIS DIP (CLINITEK)
Bilirubin, UA: NEGATIVE
Glucose, UA: NEGATIVE mg/dL
Ketones, POC UA: NEGATIVE mg/dL
Leukocytes, UA: NEGATIVE
Nitrite, UA: NEGATIVE
POC,PROTEIN,UA: NEGATIVE
Spec Grav, UA: >=1.03 — AB
Urobilinogen, UA: 0.2 U/dL
pH, UA: 5

## 2017-12-17 MED ORDER — PANTOPRAZOLE SODIUM 40 MG PO TBEC: 40.0000 mg | DELAYED_RELEASE_TABLET | Freq: Two times a day (BID) | ORAL | 6 refills | Status: DC

## 2017-12-17 MED ORDER — CITALOPRAM HYDROBROMIDE 10 MG PO TABS: 10.0000 mg | ORAL_TABLET | Freq: Every day | ORAL | 3 refills | Status: DC

## 2017-12-17 MED ORDER — TIZANIDINE HCL 4 MG PO TABS: 4.0000 mg | ORAL_TABLET | Freq: Every day | ORAL | 0 refills | Status: DC

## 2017-12-17 MED ORDER — TRAMADOL HCL 50 MG PO TABS: 50.0000 mg | ORAL_TABLET | Freq: Two times a day (BID) | ORAL | 0 refills | Status: AC | PRN

## 2017-12-17 MED ORDER — GABAPENTIN 300 MG PO CAPS: ORAL_CAPSULE | ORAL | 3 refills | Status: DC

## 2017-12-17 MED ORDER — CETIRIZINE HCL 10 MG PO TABS: 10.0000 mg | ORAL_TABLET | Freq: Every day | ORAL | 6 refills | Status: DC

## 2017-12-17 MED ORDER — DICLOFENAC SODIUM 1 % TD GEL: 2.0000 g | Freq: Four times a day (QID) | TRANSDERMAL | 6 refills | Status: DC | PRN

## 2017-12-17 MED ORDER — MELOXICAM 7.5 MG PO TABS: 7.5000 mg | ORAL_TABLET | Freq: Every day | ORAL | 4 refills | Status: DC

## 2017-12-17 MED ORDER — ALBUTEROL SULFATE HFA 108 (90 BASE) MCG/ACT IN AERS: 2.0000 | INHALATION_SPRAY | Freq: Four times a day (QID) | RESPIRATORY_TRACT | 6 refills | Status: DC | PRN

## 2017-12-17 MED ORDER — POLYETHYLENE GLYCOL 3350 17 G PO PACK: 17.0000 g | PACK | Freq: Every day | ORAL | 6 refills | Status: DC | PRN

## 2017-12-17 MED ORDER — BUDESONIDE-FORMOTEROL FUMARATE 160-4.5 MCG/ACT IN AERO: INHALATION_SPRAY | RESPIRATORY_TRACT | 6 refills | Status: DC

## 2017-12-17 MED ORDER — FLUTICASONE PROPIONATE 50 MCG/ACT NA SUSP: 2.0000 | Freq: Every day | NASAL | 3 refills | Status: AC

## 2017-12-17 MED FILL — tiZANidine HCL 4 MG TABS: 4 | 30 days supply | Qty: 30 | Fill #0

## 2017-12-17 MED FILL — SYMBICORT 160-4.5 MCG INH: 160-4.5 | 30 days supply | Qty: 10 | Fill #0

## 2017-12-17 MED FILL — !VENTOLIN HFA INHALER: 108 (90 BAS | 25 days supply | Qty: 18 | Fill #0

## 2017-12-17 MED FILL — FLUTICASONE PROP 50 MCG SPR: 50 | 30 days supply | Qty: 16 | Fill #0

## 2017-12-17 MED FILL — PANTOPRAZOLE SOD DR 40 MG T: 40 | 30 days supply | Qty: 60 | Fill #0

## 2017-12-17 MED FILL — DICLOFENAC SODIUM 1% GEL: 1 | 6 days supply | Qty: 100 | Fill #0

## 2017-12-17 MED FILL — MELOXICAM 7.5 MG TABLET: 7.5 | 30 days supply | Qty: 30 | Fill #0

## 2017-12-17 MED FILL — CITALOPRAM HBR 10 MG TABLET: 10 | 30 days supply | Qty: 30 | Fill #0

## 2017-12-17 NOTE — Progress Notes (Signed)
Pain in back legs and hips.
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
## 2017-12-17 NOTE — Progress Notes (Signed)
Subjective:    Patient ID: Margaret Owens, female    DOB: 11-Dec-1970, 47 y.o.   MRN: [id number]  HPI       47 year old female new to the practice who per nursing notes  complains of pain in her back and legs.  Patient states that she has a history of lumbar degenerative disc disease with chronic low back pain with radiation of pain down both legs.  Patient also states that she is recently diagnosed with trochanteric bursitis of her hips.  Patient also with history of asthma which she states was originally diagnosed as exercise-induced asthma but she has shortness of breath with activity.  Patient also with complaint of GERD, eczema and scoliosis.  Patient states that she is also had issues with anemia.  Patient states that she also has depression related to her chronic pain and she is also trying to gain custody of her granddaughter.  Patient states that her depression is followed at [facility].       Patient states that in April of this year she was attending pain management but states that in May she was switched to family-planning Medicaid and therefore was unable to follow-up with her usual primary care doctor or pain management.  Patient states that she has chronically been in pain since she has been unable to obtain her Percocet.  Patient reports that her back pain is a 10 on a 0-to-10 scale.  Patient also now with difficulty sleeping due to pain in both hips.  Patient states that she did take Lyrica in the past and was switched to gabapentin.  Patient believes that she would benefit from a stronger dose of the gabapentin.  Patient believes that she was taking 300 mg at bedtime.  Patient states that she has been unable to work since 2010 secondary to her chronic back pain which then led to depression.      Patient reports that she does not smoke.  Patient does drink wine approximately 2 glasses about 4 out of 7 days.  Patient reports family history of maternal grandmother with lung and breast cancer.   Patient states that her mom has leg edema and arthritis.  Patient is single, she has 3 children and her granddaughter whom she is trying to get custody of, lives with her.  Patient reports that she needs refills of all of her medications.  Patient states that she was initially diagnosed with exercise-induced asthma but she also has shortness of breath with activity such as walking.  Patient however states that her walking ability is limited due to her back pain.  Patient with complaint of fatigue secondary to poor sleep and chronic pain.  Patient has had some acid reflux symptoms and upper abdominal discomfort since being out of her medication for reflux.  Patient denies any blood in the stool or dark stools.  Patient has had some urinary frequency and lower abdominal discomfort.  Patient denies dysuria.  Patient denies any fever or chills.  Past Medical History:  Diagnosis Date  . Anxiety   . Arthritis   . Asthma   . Bursitis   . Depression   . GERD (gastroesophageal reflux disease)   . Hypertension   . Muscle spasms of both lower extremities   . PTSD (post-traumatic stress disorder)   . Sciatica   . Uterine fibroid    Past Surgical History:  Procedure Laterality Date  . colonoscopy     Family History  Problem Relation Age of Onset  .  Osteoarthritis Mother   . Breast cancer Maternal Grandmother        spread to lungs    Social History   Tobacco Use  . Smoking status: Never Smoker  . Smokeless tobacco: Never Used  Substance Use Topics  . Alcohol use: Yes    Comment: 1/week  . Drug use: No   Allergies  Allergen Reactions  . Latex Hives  . Penicillins Hives    Has patient had a PCN reaction causing immediate rash, facial/tongue/throat swelling, SOB or lightheadedness with hypotension: No Has patient had a PCN reaction causing severe rash involving mucus membranes or skin necrosis: No Has patient had a PCN reaction that required hospitalization No Has patient had a PCN  reaction occurring within the last 10 years: No If all of the above answers are "NO", then may proceed with Cephalosporin use. Has patient had a PCN reaction causing immediate rash, facial/tongue/throat swelling, SOB or lightheadedness with hypotension: No Has patient had a PCN reaction causing severe rash involving mucus membranes or skin necrosis: No Has patient had a PCN reaction that required hospitalization No Has patient had a PCN reaction occurring within the last 10 years: No If all of the above answers are "NO", then may proceed with Cephalosporin use.    Review of Systems   Patient's review of systems is positive for fatigue, negative for fever or chills.  Patient with positive nasal congestion, no sinus pressure, no sore throat and no difficulty swallowing.  Patient denies any visual disturbance.  Patient denies any headaches or dizziness.  Patient has some shortness of breath with exertion but denies cough.  No wheezing.  Patient denies any chest pain or palpitations.  Patient denies any current peripheral edema.  Patient has some lower abdominal pain and also with some reflux symptoms of epigastric discomfort.  Patient denies any blood in the stool or dark stools.  Patient denies any polydipsia, polyuria or polyphagia. Objective:   Physical Exam  BP 117/77   Pulse 89   Temp 98 F (36.7 C) (Oral)   Ht '5\' 9"'  (1.753 m)   Wt 257 lb 9.6 oz (116.8 kg)   LMP 11/22/2017   SpO2 100%   BMI 38.04 kg/m  Vital signs and nursing note reviewed General-well-nourished, well-developed, overweight for height female who was initially tearful when talking about her current stressors and chronic pain.  Patient has a single-point cane with her but did not need the cane to ambulate from the chair in the exam room onto the table and patient was able to get onto the exam table without difficulty ENT- TMs gray, patient with edema of the nasal turbinates with mild clear discharge as she was recently crying,  patient with mild posterior pharynx erythema. Neck-patient with some generalized mild thyromegaly, no lymphadenopathy, no carotid bruit Lungs-clear to auscultation bilaterally Cardiovascular-regular rate and rhythm Abdomen-soft, truncal obesity, patient with complaint of some discomfort to palpation in the epigastric area as well as over the suprapubic area, no rebound or guarding Back- patient with complaint of left CVA tenderness.  Patient also with lumbosacral discomfort and SI joint to palpation.  Patient also appears to have some cervicothoracic scoliosis but somewhat difficult to tell secondary to body habitus Extremities- no edema Psychologic- patient appears anxious and tearful initially but was able to compose herself and exhibited normal mood and judgment but with a slightly depressed mood    Assessment & Plan:  1. Chronic bilateral low back pain with bilateral sciatica Patient with complaint  of chronic low back pain with sciatica.  Prescription sent to patient's pharmacy for tramadol to take as needed.  Patient also given refill of Mobic and Zanaflex for nighttime use.  Patient will return to clinic in 1 month for further follow-up.  Patient may benefit from physical therapy or referral to pain management clinic.  2. Mild intermittent asthma without complication Patient with mild intermittent asthma.  Patient provided with refill of her Symbicort and albuterol.  Patient Symbicort was written as as needed per her past records however patient reports that she does have some shortness of breath with activity therefore Symbicort prescription is now written to take daily as this should improve her issues with shortness of breath.  3. Lower abdominal pain Patient with complaint of lower abdominal pain and urinalysis was obtained to look for possible urinary tract infection.  Patient did have hematuria on UA and urine is being sent for culture.  Patient also given refill of MiraLAX to take as  needed for constipation.  4. Anemia, unspecified type Patient's review of systems is patient reports history of anemia and CBC will be done at today's visit  5. Gastroesophageal reflux disease, esophagitis presence not specified Patient is provided with refill of pantoprazole 40 mg twice daily for her acid reflux symptoms  6. Encounter for long-term (current) use of medications  patient will have CMP in follow-up of long-term use of medications  7. Depression, unspecified depression type Patient provided with refill of citalopram.  Patient reports that she is also receiving counseling [facility].  Patient with abnormal depression screen at today's visit as well as tearfulness during initial visit and social worker met with the patient to offer resources  *Patient declined influenza immunization at today's visit  An After Visit Summary was printed and given to the patient.  Allergies as of 12/17/2017      Reactions   Latex Hives   Penicillins Hives   Has patient had a PCN reaction causing immediate rash, facial/tongue/throat swelling, SOB or lightheadedness with hypotension: No Has patient had a PCN reaction causing severe rash involving mucus membranes or skin necrosis: No Has patient had a PCN reaction that required hospitalization No Has patient had a PCN reaction occurring within the last 10 years: No If all of the above answers are "NO", then may proceed with Cephalosporin use. Has patient had a PCN reaction causing immediate rash, facial/tongue/throat swelling, SOB or lightheadedness with hypotension: No Has patient had a PCN reaction causing severe rash involving mucus membranes or skin necrosis: No Has patient had a PCN reaction that required hospitalization No Has patient had a PCN reaction occurring within the last 10 years: No If all of the above answers are "NO", then may proceed with Cephalosporin use.      Medication List        Accurate as  of 12/17/17 12:41 PM. Always use your most recent med list.          albuterol 108 (90 Base) MCG/ACT inhaler Commonly known as:  PROVENTIL HFA;VENTOLIN HFA Inhale 2 puffs into the lungs every 6 (six) hours as needed for wheezing (wheezing).   budesonide-formoterol 160-4.5 MCG/ACT inhaler Commonly known as:  SYMBICORT 2 puffs twice daily to control asthma   cetirizine 10 MG tablet Commonly known as:  ZYRTEC Take 1 tablet (10 mg total) by mouth daily.   citalopram 10 MG tablet Commonly known as:  CELEXA Take 1 tablet (10 mg total) by mouth daily.  clotrimazole 1 % cream Commonly known as:  LOTRIMIN Apply to affected area 2 times daily   desonide 0.05 % cream Commonly known as:  DESOWEN Apply 1 application topically 2 (two) times daily.   diclofenac sodium 1 % Gel Commonly known as:  VOLTAREN Apply 2 g topically 4 (four) times daily as needed (apply to leg).   fluticasone 50 MCG/ACT nasal spray Commonly known as:  FLONASE Place 2 sprays into both nostrils daily.   gabapentin 300 MG capsule Commonly known as:  NEURONTIN Take one pill twice per day and 2 pills at bedtime for nerve pain   meloxicam 7.5 MG tablet Commonly known as:  MOBIC Take 1 tablet (7.5 mg total) by mouth daily.   methocarbamol 500 MG tablet Commonly known as:  ROBAXIN Take 1 tablet (500 mg total) by mouth at bedtime as needed for muscle spasms.   pantoprazole 40 MG tablet Commonly known as:  PROTONIX Take 1 tablet (40 mg total) by mouth 2 (two) times daily.   polyethylene glycol packet Commonly known as:  MIRALAX / GLYCOLAX Take 17 g by mouth daily as needed.   tiZANidine 4 MG tablet Commonly known as:  ZANAFLEX Take 1 tablet (4 mg total) by mouth at bedtime.   traMADol 50 MG tablet Commonly known as:  ULTRAM Take 1 tablet (50 mg total) by mouth every 12 (twelve) hours as needed for moderate pain.   traZODone 50 MG tablet Commonly known as:  DESYREL Take 50 mg by mouth at bedtime as  needed for sleep (sleep).   Vitamin D (Ergocalciferol) 50000 units Caps capsule Commonly known as:  DRISDOL Take 50,000 Units by mouth every 7 (seven) days.      Return in about 4 weeks (around 01/14/2018).

## 2017-12-18 LAB — COMPREHENSIVE METABOLIC PANEL WITH GFR
ALT: 12 IU/L (ref 0–32)
AST: 14 IU/L (ref 0–40)
Albumin/Globulin Ratio: 1.3 (ref 1.2–2.2)
Albumin: 4.6 g/dL (ref 3.5–5.5)
Alkaline Phosphatase: 83 IU/L (ref 39–117)
BUN/Creatinine Ratio: 14 (ref 9–23)
BUN: 10 mg/dL (ref 6–24)
Bilirubin Total: <0.2 mg/dL (ref 0.0–1.2)
CO2: 20 mmol/L (ref 20–29)
Calcium: 9.6 mg/dL (ref 8.7–10.2)
Chloride: 102 mmol/L (ref 96–106)
Creatinine, Ser: 0.69 mg/dL (ref 0.57–1.00)
GFR calc Af Amer: 119 mL/min/1.73
GFR calc non Af Amer: 103 mL/min/1.73
Globulin, Total: 3.6 g/dL (ref 1.5–4.5)
Glucose: 83 mg/dL (ref 65–99)
Potassium: 4.3 mmol/L (ref 3.5–5.2)
Sodium: 138 mmol/L (ref 134–144)
Total Protein: 8.2 g/dL (ref 6.0–8.5)

## 2017-12-18 LAB — CBC WITH DIFFERENTIAL/PLATELET
Basophils Absolute: 0 x10E3/uL (ref 0.0–0.2)
Basos: 0 %
EOS (ABSOLUTE): 0.1 x10E3/uL (ref 0.0–0.4)
Eos: 1 %
Hematocrit: 38.6 % (ref 34.0–46.6)
Hemoglobin: 12.6 g/dL (ref 11.1–15.9)
Immature Grans (Abs): 0 x10E3/uL (ref 0.0–0.1)
Immature Granulocytes: 0 %
Lymphocytes Absolute: 3.4 x10E3/uL — ABNORMAL HIGH (ref 0.7–3.1)
Lymphs: 33 %
MCH: 27.3 pg (ref 26.6–33.0)
MCHC: 32.6 g/dL (ref 31.5–35.7)
MCV: 84 fL (ref 79–97)
Monocytes Absolute: 0.8 x10E3/uL (ref 0.1–0.9)
Monocytes: 8 %
Neutrophils Absolute: 5.9 x10E3/uL (ref 1.4–7.0)
Neutrophils: 58 %
Platelets: 513 x10E3/uL — ABNORMAL HIGH (ref 150–450)
RBC: 4.61 x10E6/uL (ref 3.77–5.28)
RDW: 14.5 % (ref 12.3–15.4)
WBC: 10.3 x10E3/uL (ref 3.4–10.8)

## 2017-12-19 LAB — URINE CULTURE

## 2017-12-20 ENCOUNTER — Other Ambulatory Visit: Payer: Self-pay | Admitting: Family Medicine

## 2017-12-20 DIAGNOSIS — N3001 Acute cystitis with hematuria: Secondary | ICD-10-CM

## 2017-12-20 MED ORDER — NITROFURANTOIN MONOHYD MACRO 100 MG PO CAPS: 100.0000 mg | ORAL_CAPSULE | Freq: Two times a day (BID) | ORAL | 0 refills | Status: AC

## 2017-12-20 NOTE — Progress Notes (Signed)
Patient with urine culture showing growth of E. Coli that is sensitive to macrobid which will be prescribed. Patient will be notified if regarding results and medication

## 2017-12-28 ENCOUNTER — Telehealth: Payer: Self-pay

## 2017-12-28 NOTE — Telephone Encounter (Signed)
-----   Message from Antony Blackbird, MD sent at 12/20/2017  9:02 AM EDT ----- Please let patient know that her urine culture indicates the growth of bacteria consistent with a UTI and RX will be sent to her pharmacy for macrobid

## 2017-12-28 NOTE — Telephone Encounter (Signed)
Patient was called, no answer, patients mailbox was full unable to leave a vm.

## 2017-12-30 ENCOUNTER — Other Ambulatory Visit: Payer: Self-pay | Admitting: Student

## 2017-12-30 DIAGNOSIS — M5442 Lumbago with sciatica, left side: Principal | ICD-10-CM

## 2017-12-30 DIAGNOSIS — G8929 Other chronic pain: Secondary | ICD-10-CM

## 2017-12-30 DIAGNOSIS — M5441 Lumbago with sciatica, right side: Principal | ICD-10-CM

## 2018-01-12 ENCOUNTER — Other Ambulatory Visit: Payer: Self-pay

## 2018-01-12 MED ORDER — TETANUS-DIPHTH-ACELL PERTUSSIS 5-2.5-18.5 LF-MCG/0.5 IM SUSP: 0.5000 mL | INTRAMUSCULAR | 0 refills | Status: DC

## 2018-01-14 ENCOUNTER — Ambulatory Visit: Payer: Medicaid Other | Attending: Family Medicine | Admitting: Family Medicine

## 2018-01-14 ENCOUNTER — Encounter: Payer: Self-pay | Admitting: Family Medicine

## 2018-01-14 DIAGNOSIS — I10 Essential (primary) hypertension: Secondary | ICD-10-CM | POA: Insufficient documentation

## 2018-01-14 DIAGNOSIS — F329 Major depressive disorder, single episode, unspecified: Secondary | ICD-10-CM | POA: Diagnosis not present

## 2018-01-14 DIAGNOSIS — K219 Gastro-esophageal reflux disease without esophagitis: Secondary | ICD-10-CM | POA: Insufficient documentation

## 2018-01-14 DIAGNOSIS — M719 Bursopathy, unspecified: Secondary | ICD-10-CM | POA: Diagnosis not present

## 2018-01-14 DIAGNOSIS — G8929 Other chronic pain: Secondary | ICD-10-CM

## 2018-01-14 DIAGNOSIS — Z880 Allergy status to penicillin: Secondary | ICD-10-CM | POA: Diagnosis not present

## 2018-01-14 DIAGNOSIS — M5442 Lumbago with sciatica, left side: Secondary | ICD-10-CM

## 2018-01-14 DIAGNOSIS — Z91040 Latex allergy status: Secondary | ICD-10-CM | POA: Insufficient documentation

## 2018-01-14 DIAGNOSIS — M1990 Unspecified osteoarthritis, unspecified site: Secondary | ICD-10-CM | POA: Insufficient documentation

## 2018-01-14 DIAGNOSIS — J45909 Unspecified asthma, uncomplicated: Secondary | ICD-10-CM | POA: Insufficient documentation

## 2018-01-14 DIAGNOSIS — M5441 Lumbago with sciatica, right side: Secondary | ICD-10-CM | POA: Diagnosis not present

## 2018-01-14 DIAGNOSIS — F4310 Post-traumatic stress disorder, unspecified: Secondary | ICD-10-CM | POA: Diagnosis not present

## 2018-01-14 DIAGNOSIS — Z79899 Other long term (current) drug therapy: Secondary | ICD-10-CM | POA: Insufficient documentation

## 2018-01-14 MED ORDER — GABAPENTIN 300 MG PO CAPS: ORAL_CAPSULE | ORAL | 4 refills | Status: DC

## 2018-01-14 NOTE — Progress Notes (Signed)
Subjective:    Patient ID: Margaret Owens, female    DOB: 02-11-1971, 47 y.o.   MRN: [id number]  HPI 47 yo female who is seen in follow-up of chronic low back pain with radiation.  Patient states that this is her primary issue at this time.  Patient does feel that her asthma is better controlled after being placed back on medications at her last visit.  Patient states however that she constantly has pain in her lower back that is always an 8 or greater and pain that radiates down both legs that is always a 10 out of 10.  Patient states that so far the only thing that has seemed to slightly less than her pain has been gabapentin.  Patient reports that the tramadol prescribed at her last visit did not seem to make a difference in her pain.  Patient states that in the past tramadol would at least lessen her pain but this time she did not notice any significant improvement in her pain with the use of tramadol.  Patient states that her back pain is a constant dull ache and also feels as if someone is stabbing or pushing something into her back.  Patient states that the pain down her legs is that sharp and sometimes has a burning quality.  Patient states that she is not able to do very much because she has pain with walking or standing but also gets pain after she has been sitting for a while.  Patient denies any symptoms such as urinary frequency or dysuria.  No fever or chills.  No loss/impairment of bowel or bladder function. Past Medical History:  Diagnosis Date  . Anxiety   . Arthritis   . Asthma   . Bursitis   . Depression   . GERD (gastroesophageal reflux disease)   . Hypertension   . Muscle spasms of both lower extremities   . PTSD (post-traumatic stress disorder)   . Sciatica   . Uterine fibroid    Past Surgical History:  Procedure Laterality Date  . colonoscopy     Family History  Problem Relation Age of Onset  . Osteoarthritis Mother   . Breast cancer Maternal Grandmother    spread to lungs    Social History   Tobacco Use  . Smoking status: Never Smoker  . Smokeless tobacco: Never Used  Substance Use Topics  . Alcohol use: Yes    Comment: 1/week  . Drug use: No   Allergies  Allergen Reactions  . Latex Hives  . Penicillins Hives    Has patient had a PCN reaction causing immediate rash, facial/tongue/throat swelling, SOB or lightheadedness with hypotension: No Has patient had a PCN reaction causing severe rash involving mucus membranes or skin necrosis: No Has patient had a PCN reaction that required hospitalization No Has patient had a PCN reaction occurring within the last 10 years: No If all of the above answers are "NO", then may proceed with Cephalosporin use. Has patient had a PCN reaction causing immediate rash, facial/tongue/throat swelling, SOB or lightheadedness with hypotension: No Has patient had a PCN reaction causing severe rash involving mucus membranes or skin necrosis: No Has patient had a PCN reaction that required hospitalization No Has patient had a PCN reaction occurring within the last 10 years: No If all of the above answers are "NO", then may proceed with Cephalosporin use.      Review of Systems  Constitutional: Positive for fatigue. Negative for chills and fever.  Respiratory:  Positive for shortness of breath (Some shortness of breath with activity). Negative for cough.   Cardiovascular: Positive for leg swelling. Negative for chest pain and palpitations.  Gastrointestinal: Negative for abdominal pain and nausea.  Genitourinary: Negative for dysuria and frequency.  Musculoskeletal: Positive for arthralgias, back pain and gait problem. Negative for joint swelling and myalgias.  Neurological: Negative for dizziness and headaches.       Objective:   Physical Exam BP 127/75 (BP Location: Left Arm, Patient Position: Sitting, Cuff Size: Large)   Pulse 83   Temp 98.1 F (36.7 C) (Oral)   Resp 18   Ht 5\' 9"  (1.753 m)   Wt  259 lb (117.5 kg)   SpO2 100%   BMI 38.25 kg/m Vital signs and nurse's notes reviewed General- obese female appearing to not feel well who is sitting in exam chair but is leaning forward with her elbows on her knees Lungs-clear to auscultation bilaterally Cardiovascular-regular rate and rhythm Abdomen-truncal obesity, soft and nontender Back- no CVA tenderness but patient with lower thoracic and lumbosacral discomfort to palpation as well as thoracolumbar paraspinous spasm.  Patient with tenderness at the bilateral SI joints as well as positive seated leg raise bilaterally Extremities- mild nonpitting distal lower extremity puffiness/edema        Assessment & Plan:  1. Chronic bilateral low back pain with bilateral sciatica Patient's dose of gabapentin has been increased from 300 mg twice daily to 600 mg twice daily and patient will continue 600 mg at bedtime.  Patient will also be referred to physical therapy for further evaluation and treatment of her low back pain. - gabapentin (NEURONTIN) 300 MG capsule; Take 2 pills twice per day and 2 pills at bedtime for nerve pain  Dispense: 240 capsule; Refill: 4 - Ambulatory referral to Physical Therapy  An After Visit Summary was printed and given to the patient. Allergies as of 01/14/2018      Reactions   Latex Hives   Penicillins Hives   Has patient had a PCN reaction causing immediate rash, facial/tongue/throat swelling, SOB or lightheadedness with hypotension: No Has patient had a PCN reaction causing severe rash involving mucus membranes or skin necrosis: No Has patient had a PCN reaction that required hospitalization No Has patient had a PCN reaction occurring within the last 10 years: No If all of the above answers are "NO", then may proceed with Cephalosporin use. Has patient had a PCN reaction causing immediate rash, facial/tongue/throat swelling, SOB or lightheadedness with hypotension: No Has patient had a PCN reaction causing  severe rash involving mucus membranes or skin necrosis: No Has patient had a PCN reaction that required hospitalization No Has patient had a PCN reaction occurring within the last 10 years: No If all of the above answers are "NO", then may proceed with Cephalosporin use.      Medication List        Accurate as of 01/14/18 11:59 PM. Always use your most recent med list.          albuterol 108 (90 Base) MCG/ACT inhaler Commonly known as:  PROVENTIL HFA;VENTOLIN HFA Inhale 2 puffs into the lungs every 6 (six) hours as needed for wheezing (wheezing).   budesonide-formoterol 160-4.5 MCG/ACT inhaler Commonly known as:  SYMBICORT 2 puffs twice daily to control asthma   cetirizine 10 MG tablet Commonly known as:  ZYRTEC Take 1 tablet (10 mg total) by mouth daily.   citalopram 10 MG tablet Commonly known as:  CELEXA Take 1 tablet (  10 mg total) by mouth daily.   clotrimazole 1 % cream Commonly known as:  LOTRIMIN Apply to affected area 2 times daily   desonide 0.05 % cream Commonly known as:  DESOWEN Apply 1 application topically 2 (two) times daily.   diclofenac sodium 1 % Gel Commonly known as:  VOLTAREN Apply 2 g topically 4 (four) times daily as needed (apply to leg).   fluticasone 50 MCG/ACT nasal spray Commonly known as:  FLONASE Place 2 sprays into both nostrils daily.   gabapentin 300 MG capsule Commonly known as:  NEURONTIN Take 2 pills twice per day and 2 pills at bedtime for nerve pain   meloxicam 7.5 MG tablet Commonly known as:  MOBIC Take 1 tablet (7.5 mg total) by mouth daily.   methocarbamol 500 MG tablet Commonly known as:  ROBAXIN Take 1 tablet (500 mg total) by mouth at bedtime as needed for muscle spasms.   pantoprazole 40 MG tablet Commonly known as:  PROTONIX Take 1 tablet (40 mg total) by mouth 2 (two) times daily.   phentermine 37.5 MG capsule Take 37.5 mg by mouth every morning.   polyethylene glycol packet Commonly known as:  MIRALAX /  GLYCOLAX Take 17 g by mouth daily as needed.   tiZANidine 4 MG tablet Commonly known as:  ZANAFLEX Take 1 tablet (4 mg total) by mouth at bedtime.   traMADol 50 MG tablet Commonly known as:  ULTRAM Take 1 tablet (50 mg total) by mouth every 12 (twelve) hours as needed for moderate pain.   traZODone 50 MG tablet Commonly known as:  DESYREL Take 50 mg by mouth at bedtime as needed for sleep (sleep).   Vitamin D (Ergocalciferol) 50000 units Caps capsule Commonly known as:  DRISDOL Take 50,000 Units by mouth every 7 (seven) days.     Return in about 3 months (around 04/16/2018) for back pain and asthma.

## 2018-01-25 ENCOUNTER — Telehealth: Payer: Self-pay | Admitting: Licensed Clinical Social Worker

## 2018-01-25 NOTE — Telephone Encounter (Signed)
Call placed to patient in attempt to schedule behavioral health appointment. Pt's voicemail was full; therefore, LCSWA was unable to leave message for a return call.

## 2018-01-28 ENCOUNTER — Telehealth: Payer: Self-pay | Admitting: Licensed Clinical Social Worker

## 2018-01-28 NOTE — Telephone Encounter (Signed)
Call placed to patient in attempt to schedule behavioral health appointment. LCSWA left message for a return call.

## 2018-04-18 ENCOUNTER — Encounter: Payer: Self-pay | Admitting: Family Medicine

## 2018-04-18 ENCOUNTER — Ambulatory Visit: Payer: Medicaid Other | Attending: Family Medicine | Admitting: Family Medicine

## 2018-04-18 ENCOUNTER — Ambulatory Visit (HOSPITAL_BASED_OUTPATIENT_CLINIC_OR_DEPARTMENT_OTHER): Payer: Medicaid Other | Admitting: Licensed Clinical Social Worker

## 2018-04-18 VITALS — BP 138/70 | HR 89 | Temp 98.9°F | Resp 18 | Ht 69.0 in | Wt 277.0 lb

## 2018-04-18 DIAGNOSIS — E669 Obesity, unspecified: Secondary | ICD-10-CM | POA: Diagnosis not present

## 2018-04-18 DIAGNOSIS — Z880 Allergy status to penicillin: Secondary | ICD-10-CM | POA: Diagnosis not present

## 2018-04-18 DIAGNOSIS — Z79899 Other long term (current) drug therapy: Secondary | ICD-10-CM | POA: Diagnosis not present

## 2018-04-18 DIAGNOSIS — Z6841 Body Mass Index (BMI) 40.0 and over, adult: Secondary | ICD-10-CM

## 2018-04-18 DIAGNOSIS — F4310 Post-traumatic stress disorder, unspecified: Secondary | ICD-10-CM | POA: Insufficient documentation

## 2018-04-18 DIAGNOSIS — M62838 Other muscle spasm: Secondary | ICD-10-CM | POA: Insufficient documentation

## 2018-04-18 DIAGNOSIS — Z599 Problem related to housing and economic circumstances, unspecified: Secondary | ICD-10-CM

## 2018-04-18 DIAGNOSIS — J4520 Mild intermittent asthma, uncomplicated: Secondary | ICD-10-CM | POA: Diagnosis not present

## 2018-04-18 DIAGNOSIS — Z791 Long term (current) use of non-steroidal anti-inflammatories (NSAID): Secondary | ICD-10-CM | POA: Insufficient documentation

## 2018-04-18 DIAGNOSIS — K219 Gastro-esophageal reflux disease without esophagitis: Secondary | ICD-10-CM | POA: Insufficient documentation

## 2018-04-18 DIAGNOSIS — F329 Major depressive disorder, single episode, unspecified: Secondary | ICD-10-CM | POA: Diagnosis not present

## 2018-04-18 DIAGNOSIS — R0789 Other chest pain: Secondary | ICD-10-CM | POA: Insufficient documentation

## 2018-04-18 DIAGNOSIS — F331 Major depressive disorder, recurrent, moderate: Secondary | ICD-10-CM

## 2018-04-18 DIAGNOSIS — M5441 Lumbago with sciatica, right side: Secondary | ICD-10-CM | POA: Diagnosis present

## 2018-04-18 DIAGNOSIS — F419 Anxiety disorder, unspecified: Secondary | ICD-10-CM | POA: Diagnosis not present

## 2018-04-18 DIAGNOSIS — J4521 Mild intermittent asthma with (acute) exacerbation: Secondary | ICD-10-CM

## 2018-04-18 DIAGNOSIS — E66813 Obesity, class 3: Secondary | ICD-10-CM

## 2018-04-18 DIAGNOSIS — Z7951 Long term (current) use of inhaled steroids: Secondary | ICD-10-CM | POA: Diagnosis not present

## 2018-04-18 DIAGNOSIS — F32A Depression, unspecified: Secondary | ICD-10-CM

## 2018-04-18 DIAGNOSIS — I10 Essential (primary) hypertension: Secondary | ICD-10-CM | POA: Diagnosis not present

## 2018-04-18 DIAGNOSIS — M5442 Lumbago with sciatica, left side: Secondary | ICD-10-CM

## 2018-04-18 DIAGNOSIS — R269 Unspecified abnormalities of gait and mobility: Secondary | ICD-10-CM

## 2018-04-18 DIAGNOSIS — G8929 Other chronic pain: Secondary | ICD-10-CM

## 2018-04-18 DIAGNOSIS — M1990 Unspecified osteoarthritis, unspecified site: Secondary | ICD-10-CM | POA: Insufficient documentation

## 2018-04-18 DIAGNOSIS — Z598 Other problems related to housing and economic circumstances: Secondary | ICD-10-CM

## 2018-04-18 MED ORDER — GABAPENTIN 300 MG PO CAPS: ORAL_CAPSULE | ORAL | 4 refills | Status: DC

## 2018-04-18 MED ORDER — AZITHROMYCIN 250 MG PO TABS: ORAL_TABLET | ORAL | 0 refills | Status: DC

## 2018-04-18 MED ORDER — ALBUTEROL SULFATE HFA 108 (90 BASE) MCG/ACT IN AERS: 2.0000 | INHALATION_SPRAY | Freq: Four times a day (QID) | RESPIRATORY_TRACT | 6 refills | Status: DC | PRN

## 2018-04-18 MED ORDER — PREDNISONE 20 MG PO TABS: ORAL_TABLET | ORAL | 0 refills | Status: DC

## 2018-04-18 MED ORDER — TIZANIDINE HCL 4 MG PO TABS: 4.0000 mg | ORAL_TABLET | Freq: Every day | ORAL | 4 refills | Status: DC

## 2018-04-18 MED ORDER — DICLOFENAC SODIUM 1 % TD GEL: 2.0000 g | Freq: Four times a day (QID) | TRANSDERMAL | 6 refills | Status: DC | PRN

## 2018-04-18 MED ORDER — MELOXICAM 7.5 MG PO TABS: 7.5000 mg | ORAL_TABLET | Freq: Every day | ORAL | 4 refills | Status: DC

## 2018-04-18 MED ORDER — BUDESONIDE-FORMOTEROL FUMARATE 160-4.5 MCG/ACT IN AERO: INHALATION_SPRAY | RESPIRATORY_TRACT | 6 refills | Status: DC

## 2018-04-18 MED FILL — $VENTOLIN HFA 18G INHALER: 108 (90 BAS | 84 days supply | Qty: 54 | Fill #0

## 2018-04-18 MED FILL — predniSONE 20 MG TABS: 20 | 5 days supply | Qty: 10 | Fill #0

## 2018-04-18 NOTE — Progress Notes (Signed)
Subjective:    Patient ID: Margaret Owens, female    DOB: 1970/08/13, 48 y.o.   MRN: [id number]  HPI       48 year old female who was last seen in the office on 01/14/2018 secondary to complaint of chronic bilateral low back pain with radiation.  Patient also with a history of asthma but she reported that this was better controlled after restarting her medications. Per CMA, patient presents for refills of medications related to her chronic low back pain.  Patient was also referred to physical therapy at her last appointment.  Patient states that she was unable to attend physical therapy due to continued issues with her insurance.  Patient states that she does have upcoming appointment this weekend for disability examination.      Patient reports that she continues to have chronic midline low back pain which radiates to both legs and stops at the knees.  Patient states that her back pain is a 10 on a 0-to-10 scale with 10 being the worst pain possible.  Patient describes her pain as a combination of sharp, dull, throbbing and aching.  Patient also with radiation of pain to both legs left greater than right which is also a 10 on a 0-to-10 scale and she describes it as a combination of sharp dull throbbing, aching and sometimes stinging/burning.  Patient also occasionally gets a sensation that something is crawling on her lower leg.  Patient reports that medications give minimal relief.  Patient would like to have a refill of medications for back pain at today's visit and patient also has recurrent left knee pain.      Patient additionally reports that she has had a increased cough, cough is sometimes productive of yellow sputum.  Patient with nasal congestion with occasional small blood clots.  Patient with sore throat secondary to postnasal drainage.  Patient reports that she has had increased chest tightness, shortness of breath and sensation of wheezing.  Patient reports increased use of her albuterol  inhaler.  Patient states that symptoms have been present for the past 3 weeks.  Patient reports approximately 20 days ago she had recurrent diarrhea after eating but this has resolved.  Patient denies any fever or chills.  Patient has had increased fatigue.  Patient also needs refill of Symbicort.      Patient reports that she would like a refill of phentermine.  Patient reports that she had 2 refills but did not get these filled in time.  Patient had discontinued use of the medication due to not feeling well.  Patient denies any chest pain or palpitations.  No headaches or dizziness. Past Medical History:  Diagnosis Date  . Anxiety   . Arthritis   . Asthma   . Bursitis   . Depression   . GERD (gastroesophageal reflux disease)   . Hypertension   . Muscle spasms of both lower extremities   . PTSD (post-traumatic stress disorder)   . Sciatica   . Uterine fibroid    Past Surgical History:  Procedure Laterality Date  . colonoscopy     Family History  Problem Relation Age of Onset  . Osteoarthritis Mother   . Breast cancer Maternal Grandmother        spread to lungs    Social History   Tobacco Use  . Smoking status: Never Smoker  . Smokeless tobacco: Never Used  Substance Use Topics  . Alcohol use: Yes    Comment: 1/week  . Drug use: No  Allergies  Allergen Reactions  . Latex Hives  . Penicillins Hives    Has patient had a PCN reaction causing immediate rash, facial/tongue/throat swelling, SOB or lightheadedness with hypotension: No Has patient had a PCN reaction causing severe rash involving mucus membranes or skin necrosis: No Has patient had a PCN reaction that required hospitalization No Has patient had a PCN reaction occurring within the last 10 years: No If all of the above answers are "NO", then may proceed with Cephalosporin use. Has patient had a PCN reaction causing immediate rash, facial/tongue/throat swelling, SOB or lightheadedness with hypotension: No Has  patient had a PCN reaction causing severe rash involving mucus membranes or skin necrosis: No Has patient had a PCN reaction that required hospitalization No Has patient had a PCN reaction occurring within the last 10 years: No If all of the above answers are "NO", then may proceed with Cephalosporin use.    Review of Systems  Constitutional: Positive for fatigue. Negative for chills and fever.  HENT: Positive for congestion, postnasal drip, rhinorrhea and sore throat. Negative for ear pain, sinus pressure, sinus pain and trouble swallowing.   Respiratory: Positive for cough, chest tightness, shortness of breath and wheezing.   Cardiovascular: Negative for chest pain, palpitations and leg swelling.  Gastrointestinal: Positive for nausea. Negative for abdominal pain, blood in stool, constipation and diarrhea (No current diarrhea, last episode was 20 days ago).  Genitourinary: Negative for dysuria and frequency.  Musculoskeletal: Positive for arthralgias, back pain and gait problem.  Neurological: Negative for dizziness and headaches.  Hematological: Negative for adenopathy. Does not bruise/bleed easily.  Psychiatric/Behavioral: Positive for sleep disturbance (On trazodone). Negative for self-injury and suicidal ideas. The patient is nervous/anxious.        Objective:   Physical Exam BP 138/70 (BP Location: Left Arm, Patient Position: Sitting, Cuff Size: Normal)   Pulse 89   Temp 98.9 F (37.2 C) (Oral)   Resp 18   Ht 5' 9" (1.753 m)   Wt 277 lb (125.6 kg)   LMP 04/07/2018   SpO2 99%   BMI 40.91 kg/m Nurse's notes and vital signs reviewed General-obese female in no acute distress but who appears fatigued with sitting on a chair in the exam room, in a slightly stooped position and patient is holding onto a cane ENT-TMs dull, nares with edema of the nasal turbinates with mild erythema but no current discharge, patient with mild posterior pharynx erythema with cobblestoning Neck-supple,  no lymphadenopathy, no carotid bruit Lungs- mild decreased air movement, mild decreased breath sounds, no current wheeze, breathing is unlabored, no accessory muscle use Cardiovascular-regular rate and rhythm Abdomen-truncal obesity, soft and nontender Back- patient with complaint of discomfort with palpation of the back from about mid back at the bra line down to L5/S1 area and patient with bilateral SI joint tenderness.  Patient with negative seated leg raise bilaterally.  Patient with marked spasm across the upper back/trapezius area.  Patient with mild thoracolumbar paraspinous spasm Extremities-no edema Psych- patient with slightly flattened affect but interacts appropriately        Assessment & Plan:  1. Chronic bilateral low back pain with bilateral sciatica Patient with complaint of continued chronic bilateral low back pain with radiation.  Patient is provided with refills of Zanaflex to take at bedtime for muscle spasm, Mobic to take as needed for back pain, gabapentin to take for neuropathic pain and Voltaren gel to use as needed as patient also with complaint of some left knee pain.  Patient with current complaint of recent asthma exacerbation for which she will be treated and patient will be seen again in approximately 2 weeks for reevaluation.  Patient reports upcoming disability examination.  At follow-up visit, we will see if patient feels that she would be able to tolerate referral to physical therapy as she was unable to attend after her last visit. - tiZANidine (ZANAFLEX) 4 MG tablet; Take 1 tablet (4 mg total) by mouth at bedtime. As needed for muscle spasm  Dispense: 30 tablet; Refill: 4 - meloxicam (MOBIC) 7.5 MG tablet; Take 1 tablet (7.5 mg total) by mouth daily. As needed for back pain.  Eat prior to taking the medication  Dispense: 30 tablet; Refill: 4 - gabapentin (NEURONTIN) 300 MG capsule; Take 2 pills twice per day and 2 pills at bedtime for nerve pain  Dispense: 240  capsule; Refill: 4 - diclofenac sodium (VOLTAREN) 1 % GEL; Apply 2 g topically 4 (four) times daily as needed (apply to leg).  Dispense: 4 Tube; Refill: 6  2. Mild intermittent asthma without complication Patient is provided with her chronic medications, Symbicort as well as a refill of albuterol however patient reports recent increased chest tightness/wheezing and shortness of breath which will be treated at today's visit. - budesonide-formoterol (SYMBICORT) 160-4.5 MCG/ACT inhaler; 2 puffs twice daily to control asthma  Dispense: 1 Inhaler; Refill: 6 - albuterol (PROVENTIL HFA;VENTOLIN HFA) 108 (90 Base) MCG/ACT inhaler; Inhale 2 puffs into the lungs every 6 (six) hours as needed for wheezing (wheezing).  Dispense: 1 Inhaler; Refill: 6  3. Mild intermittent acute asthmatic bronchitis with acute exacerbation Patient complains of recent increase in cough-occasionally productive, chest tightness, wheeze along with nasal congestion and a recent increase in shortness of breath.  Patient will be placed on short course of prednisone as well as azithromycin Z-Pak and patient should use her inhaler every 4-6 hours x72 hours and then may resume as needed use- predniSONE (DELTASONE) 20 MG tablet; 2 pills once daily x5 days.  Take after eating  Dispense: 10 tablet; Refill: 0 - azithromycin (ZITHROMAX) 250 MG tablet; Take 2 pills on the first day then 1 pill daily x4 days  Dispense: 6 tablet; Refill: 0  4. Gait abnormality Patient with gait abnormality secondary to bilateral low back pain with radiation/lumbar radiculopathy.  Patient unfortunately was unable to attend physical therapy where she was referred after her last visit but this will be readdressed at her follow-up appointment in 2 weeks  5. Anxiety and depression Patient is currently on medication and has follow-up but as patient expresses continued anxiety and depression through abnormal PHQ 9, medical social worker met with the patient at today's  visit in consult - Ambulatory referral to Social Work  6.  Obesity- BMI of 40 Patient with obesity and requested refill of phentermine.  Discussed with the patient that this medication is usually prescribed short-term and that with the help dietary change and exercise that use of the medication is likely to result in future weight gain once medication is discontinued.  Will readdress with the patient when she returns in 2 weeks for follow-up.  An After Visit Summary was printed and given to the patient.  Allergies as of 04/18/2018      Reactions   Latex Hives   Penicillins Hives   Has patient had a PCN reaction causing immediate rash, facial/tongue/throat swelling, SOB or lightheadedness with hypotension: No Has patient had a PCN reaction causing severe rash involving mucus membranes or skin necrosis:  No Has patient had a PCN reaction that required hospitalization No Has patient had a PCN reaction occurring within the last 10 years: No If all of the above answers are "NO", then may proceed with Cephalosporin use. Has patient had a PCN reaction causing immediate rash, facial/tongue/throat swelling, SOB or lightheadedness with hypotension: No Has patient had a PCN reaction causing severe rash involving mucus membranes or skin necrosis: No Has patient had a PCN reaction that required hospitalization No Has patient had a PCN reaction occurring within the last 10 years: No If all of the above answers are "NO", then may proceed with Cephalosporin use.      Medication List       Accurate as of April 18, 2018 12:09 PM. Always use your most recent med list.        albuterol 108 (90 Base) MCG/ACT inhaler Commonly known as:  PROVENTIL HFA;VENTOLIN HFA Inhale 2 puffs into the lungs every 6 (six) hours as needed for wheezing (wheezing).   azithromycin 250 MG tablet Commonly known as:  ZITHROMAX Take 2 pills on the first day then 1 pill daily x4 days   budesonide-formoterol 160-4.5 MCG/ACT  inhaler Commonly known as:  SYMBICORT 2 puffs twice daily to control asthma   cetirizine 10 MG tablet Commonly known as:  ZYRTEC Take 1 tablet (10 mg total) by mouth daily.   citalopram 10 MG tablet Commonly known as:  CELEXA Take 1 tablet (10 mg total) by mouth daily.   clotrimazole 1 % cream Commonly known as:  LOTRIMIN Apply to affected area 2 times daily   desonide 0.05 % cream Commonly known as:  DESOWEN Apply 1 application topically 2 (two) times daily.   diclofenac sodium 1 % Gel Commonly known as:  VOLTAREN Apply 2 g topically 4 (four) times daily as needed (apply to leg).   fluticasone 50 MCG/ACT nasal spray Commonly known as:  FLONASE Place 2 sprays into both nostrils daily.   gabapentin 300 MG capsule Commonly known as:  NEURONTIN Take 2 pills twice per day and 2 pills at bedtime for nerve pain   meloxicam 7.5 MG tablet Commonly known as:  MOBIC Take 1 tablet (7.5 mg total) by mouth daily. As needed for back pain.  Eat prior to taking the medication   methocarbamol 500 MG tablet Commonly known as:  ROBAXIN Take 1 tablet (500 mg total) by mouth at bedtime as needed for muscle spasms.   pantoprazole 40 MG tablet Commonly known as:  PROTONIX Take 1 tablet (40 mg total) by mouth 2 (two) times daily.   phentermine 37.5 MG capsule Take 37.5 mg by mouth every morning.   polyethylene glycol packet Commonly known as:  MIRALAX Take 17 g by mouth daily as needed.   predniSONE 20 MG tablet Commonly known as:  DELTASONE 2 pills once daily x5 days.  Take after eating   tiZANidine 4 MG tablet Commonly known as:  ZANAFLEX Take 1 tablet (4 mg total) by mouth at bedtime. As needed for muscle spasm   traZODone 50 MG tablet Commonly known as:  DESYREL Take 50 mg by mouth at bedtime as needed for sleep (sleep).   Vitamin D (Ergocalciferol) 1.25 MG (50000 UT) Caps capsule Commonly known as:  DRISDOL Take 50,000 Units by mouth every 7 (seven) days.      Return  in about 2 weeks (around 05/02/2018) for Asthma exacerbation follow-up in 1 to 2 weeks.

## 2018-04-19 NOTE — BH Specialist Note (Signed)
Integrated Behavioral Health Follow Up Visit  MRN: [id number] Name: PAIDYN MCFERRAN  Number of [facility] [facility] Clinician visits: 2/6 Session Start time: 11:45 AM  Session End time: 12:15 PM Total time: 30 minutes  Type of Service: [place] Interpretor:No. Interpretor Name and Language: N/A  SUBJECTIVE: Margaret Owens is a 48 y.o. female accompanied by self Patient was referred by Dr. Chapman Fitch for depression and anxiety. Patient reports the following symptoms/concerns: Pt has difficulty managing ongoing medical conditions and mental health. She reports triggers of financial strain and chronic pain Duration of problem: Ongoing; Severity of problem: severe  OBJECTIVE: Mood: Anxious and Depressed and Affect: Appropriate Risk of harm to self or others: No plan to harm self or others Pt scored positive on phq9; however, denies current SI/HI/AVH. Protective factors identified, safety plan discussed, and crisis intervention resources provided  LIFE CONTEXT: Family and Social: Pt cares for granddaughter (18yo) She receives limited support from sons School/Work: Pt has no income. She receives food stamps. Pt has lawyer to appeal disability Self-Care: Pt participates in medication management through PCP Life Changes: Pt has difficulty managing ongoing medical conditions and mental health. She reports triggers of financial strain and chronic pain  GOALS ADDRESSED: Patient will: 1.  Reduce symptoms of: anxiety, depression and stress  2.  Increase knowledge and/or ability of: coping skills and self-management skills  3.  Demonstrate ability to: Increase healthy adjustment to current life circumstances and Increase adequate support systems for patient/family  INTERVENTIONS: Interventions utilized:  Solution-Focused Strategies, Supportive Counseling and Link to [employer] Standardized Assessments completed: GAD-7 and PHQ 2&9 with  C-SSRS  ASSESSMENT: Patient currently experiencing depression and anxiety. Pt has difficulty managing ongoing medical conditions and mental health. She reports triggers of financial strain and chronic pain. She receives limited support while caring for minor granddaughter.   Patient may benefit from psychotherapy. She participates in medication management through PCP. Pt scored positive on phq9; however, denies current SI/HI/AVH. Protective factors identified, safety plan discussed, and crisis intervention resources provided. Therapeutic interventions were discussed to decrease and/or manage symptoms. Pt was encouraged to apply for financial assistance to assist with referrals to physical therapy. Additional information for [facility] was provided.   PLAN: 1. Follow up with behavioral health clinician on : LCSWA encouraged pt to schedule follow up appointment 2. Behavioral recommendations: LCSWA recommends that pt apply healthy coping skills, comply with medication management, and utilize provided resources 3. Referral(s): [facility] (In [facility]) and [employer] Resources:  [employer] 4. "From scale of 1-10, how likely are you to follow plan?":   Rebekah Chesterfield, LCSW 04/23/2018 10:35 AM

## 2018-04-26 ENCOUNTER — Encounter (HOSPITAL_COMMUNITY): Payer: Self-pay

## 2018-04-26 ENCOUNTER — Emergency Department (HOSPITAL_COMMUNITY)
Admission: EM | Admit: 2018-04-26 | Discharge: 2018-04-27 | Disposition: A | Discharge status: Home / Self Care | Payer: Medicaid Other | Attending: Emergency Medicine | Admitting: Emergency Medicine

## 2018-04-26 ENCOUNTER — Emergency Department (HOSPITAL_COMMUNITY): Payer: Medicaid Other

## 2018-04-26 DIAGNOSIS — I10 Essential (primary) hypertension: Secondary | ICD-10-CM | POA: Diagnosis not present

## 2018-04-26 DIAGNOSIS — M25562 Pain in left knee: Secondary | ICD-10-CM | POA: Diagnosis present

## 2018-04-26 DIAGNOSIS — M1990 Unspecified osteoarthritis, unspecified site: Secondary | ICD-10-CM | POA: Insufficient documentation

## 2018-04-26 DIAGNOSIS — J45909 Unspecified asthma, uncomplicated: Secondary | ICD-10-CM | POA: Insufficient documentation

## 2018-04-26 DIAGNOSIS — Z79899 Other long term (current) drug therapy: Secondary | ICD-10-CM | POA: Insufficient documentation

## 2018-04-26 NOTE — ED Triage Notes (Signed)
Pt states that she has had L knee pain and swelling for the past 2-3 days, denies injury or fevers, significant amount of swelling noted

## 2018-04-27 LAB — CBC WITH DIFFERENTIAL/PLATELET
Abs Immature Granulocytes: 0.1 10*3/uL — ABNORMAL HIGH (ref 0.00–0.07)
BASOS ABS: 0.1 10*3/uL (ref 0.0–0.1)
Basophils Relative: 1 %
EOS PCT: 3 %
Eosinophils Absolute: 0.3 10*3/uL (ref 0.0–0.5)
HEMATOCRIT: 37.8 % (ref 36.0–46.0)
Hemoglobin: 11.7 g/dL — ABNORMAL LOW (ref 12.0–15.0)
Immature Granulocytes: 1 %
LYMPHS ABS: 3.2 10*3/uL (ref 0.7–4.0)
Lymphocytes Relative: 29 %
MCH: 27.1 pg (ref 26.0–34.0)
MCHC: 31 g/dL (ref 30.0–36.0)
MCV: 87.5 fL (ref 80.0–100.0)
MONO ABS: 0.8 10*3/uL (ref 0.1–1.0)
Monocytes Relative: 7 %
NRBC: 0 % (ref 0.0–0.2)
Neutro Abs: 6.6 10*3/uL (ref 1.7–7.7)
Neutrophils Relative %: 59 %
Platelets: 423 10*3/uL — ABNORMAL HIGH (ref 150–400)
RBC: 4.32 MIL/uL (ref 3.87–5.11)
RDW: 13.8 % (ref 11.5–15.5)
WBC: 11.1 10*3/uL — ABNORMAL HIGH (ref 4.0–10.5)

## 2018-04-27 LAB — BASIC METABOLIC PANEL
Anion gap: 9 (ref 5–15)
BUN: 9 mg/dL (ref 6–20)
CALCIUM: 9 mg/dL (ref 8.9–10.3)
CO2: 23 mmol/L (ref 22–32)
CREATININE: 0.55 mg/dL (ref 0.44–1.00)
Chloride: 105 mmol/L (ref 98–111)
GFR calc non Af Amer: >60 mL/min (ref >60)
Glucose, Bld: 107 mg/dL — ABNORMAL HIGH (ref 70–99)
Potassium: 4.2 mmol/L (ref 3.5–5.1)
Sodium: 137 mmol/L (ref 135–145)

## 2018-04-27 LAB — SEDIMENTATION RATE: Sed Rate: 31 mm/hr — ABNORMAL HIGH (ref 0–22)

## 2018-04-27 LAB — C-REACTIVE PROTEIN: CRP: 1.2 mg/dL — AB (ref <1.0)

## 2018-04-27 MED ORDER — TRAMADOL HCL 50 MG PO TABS: 50.0000 mg | ORAL_TABLET | Freq: Four times a day (QID) | ORAL | 0 refills | Status: DC | PRN

## 2018-04-27 MED ORDER — MELOXICAM 15 MG PO TABS: 15.0000 mg | ORAL_TABLET | Freq: Every day | ORAL | 0 refills | Status: DC

## 2018-04-27 MED ORDER — OXYCODONE-ACETAMINOPHEN 5-325 MG PO TABS
1.0000 | ORAL_TABLET | Freq: Once | ORAL | Status: AC
  Administered 2018-04-27: 1 via ORAL
  Filled 2018-04-27: qty 1

## 2018-04-27 MED ORDER — IBUPROFEN 800 MG PO TABS
800.0000 mg | ORAL_TABLET | Freq: Once | ORAL | Status: AC
  Administered 2018-04-27: 800 mg via ORAL
  Filled 2018-04-27: qty 1

## 2018-04-27 NOTE — Progress Notes (Signed)
Orthopedic Tech Progress Note Patient Details:  Margaret Owens [birth year] [id number]  Ortho Devices Type of Ortho Device: Knee Immobilizer Ortho Device/Splint Location: lle Ortho Device/Splint Interventions: Ordered, Application, Adjustment   Post Interventions Patient Tolerated: Well Instructions Provided: Care of device, Adjustment of device   Karolee Stamps 04/27/2018, 4:43 AM

## 2018-04-27 NOTE — ED Provider Notes (Signed)
[facility] EMERGENCY DEPARTMENT Provider Note   CSN: [id number] Arrival date & time: 04/26/18  2234     History   Chief Complaint Chief Complaint  Patient presents with  . Knee Pain    HPI Margaret Owens is a 48 y.o. female.  Patient presents to the emergency department for evaluation of left knee pain and swelling.  She reports that she has a history of arthritis in both of her knees.  She has been doing a lot of walking the last few days with her grandkids.  For 2 days she has had pain and swelling of the left knee.  She reports that usually she has more problems with the right knee.  She denies any injury.  She has not had any fever.  She is able to walk but it hurts when she walks.  She was on prednisone a week ago for her asthma.  Asthma/bronchitis.  She has not had any skin infections or history of blood infections.  She does not use IV drugs.     Past Medical History:  Diagnosis Date  . Anxiety   . Arthritis   . Asthma   . Bursitis   . Depression   . GERD (gastroesophageal reflux disease)   . Hypertension   . Muscle spasms of both lower extremities   . PTSD (post-traumatic stress disorder)   . Sciatica   . Uterine fibroid     Patient Active Problem List   Diagnosis Date Noted  . Uterine fibroid 06/09/2017  . 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 colonoscopy       OB History    Gravida  3   Para  3   Term  3   Preterm      AB      Living  3     SAB  0   TAB  0   Ectopic  0   Multiple  0   Live Births  3            Home Medications    Prior to Admission medications   Medication Sig Start Date End Date Taking? Authorizing Provider  albuterol (PROVENTIL HFA;VENTOLIN HFA) 108 (90 Base) MCG/ACT inhaler Inhale 2 puffs into the lungs every 6 (six) hours as needed for wheezing (wheezing). 04/18/18   Fulp, Cammie, MD  azithromycin (ZITHROMAX) 250 MG tablet Take 2  pills on the first day then 1 pill daily x4 days 04/18/18   Antony Blackbird, MD  budesonide-formoterol (SYMBICORT) 160-4.5 MCG/ACT inhaler 2 puffs twice daily to control asthma 04/18/18   Fulp, Cammie, MD  cetirizine (ZYRTEC) 10 MG tablet Take 1 tablet (10 mg total) by mouth daily. 12/17/17   Fulp, Cammie, MD  citalopram (CELEXA) 10 MG tablet Take 1 tablet (10 mg total) by mouth daily. 12/17/17   Fulp, Cammie, MD  clotrimazole (LOTRIMIN) 1 % cream Apply to affected area 2 times daily 10/09/17   Charlann Lange, PA-C  desonide (DESOWEN) 0.05 % cream Apply 1 application topically 2 (two) times daily.    [provider]  diclofenac sodium (VOLTAREN) 1 % GEL Apply 2 g topically 4 (four) times daily as needed (apply to leg). 04/18/18   Fulp, Cammie, MD  fluticasone (FLONASE) 50 MCG/ACT nasal spray Place 2 sprays into both nostrils daily. 12/17/17   Fulp, Cammie, MD  gabapentin (NEURONTIN) 300 MG capsule Take 2 pills twice per day and 2 pills  at bedtime for nerve pain 04/18/18   Fulp, Cammie, MD  meloxicam (MOBIC) 15 MG tablet Take 1 tablet (15 mg total) by mouth daily. 04/27/18   Orpah Greek, MD  methocarbamol (ROBAXIN) 500 MG tablet Take 1 tablet (500 mg total) by mouth at bedtime as needed for muscle spasms. 12/01/17   Jorje Guild, NP  pantoprazole (PROTONIX) 40 MG tablet Take 1 tablet (40 mg total) by mouth 2 (two) times daily. 12/17/17   Fulp, Cammie, MD  phentermine 37.5 MG capsule Take 37.5 mg by mouth every morning.    [provider]  polyethylene glycol (MIRALAX) packet Take 17 g by mouth daily as needed. 12/17/17   Fulp, Cammie, MD  predniSONE (DELTASONE) 20 MG tablet 2 pills once daily x5 days.  Take after eating 04/18/18   Fulp, Cammie, MD  tiZANidine (ZANAFLEX) 4 MG tablet Take 1 tablet (4 mg total) by mouth at bedtime. As needed for muscle spasm 04/18/18   Fulp, Cammie, MD  traMADol (ULTRAM) 50 MG tablet Take 1 tablet (50 mg total) by mouth every 6 (six) hours as needed. 04/27/18    Orpah Greek, MD  traZODone (DESYREL) 50 MG tablet Take 50 mg by mouth at bedtime as needed for sleep (sleep).     [provider]  Vitamin D, Ergocalciferol, (DRISDOL) 50000 UNITS CAPS capsule Take 50,000 Units by mouth every 7 (seven) days.    [provider]    Family History Family History  Problem Relation Age of Onset  . Osteoarthritis Mother   . Breast cancer Maternal Grandmother        spread to lungs     Social History Social History   Tobacco Use  . Smoking status: Never Smoker  . Smokeless tobacco: Never Used  Substance Use Topics  . Alcohol use: Yes    Comment: 1/week  . Drug use: No     Allergies   Latex and Penicillins   Review of Systems Review of Systems  Constitutional: Negative for fever.  Musculoskeletal: Positive for arthralgias.  All other systems reviewed and are negative.    Physical Exam Updated Vital Signs BP 105/63   Pulse 72   Temp 98.8 F (37.1 C) (Oral)   Resp 17   LMP 04/07/2018   SpO2 100%   Physical Exam Vitals signs and nursing note reviewed.  Constitutional:      General: She is not in acute distress.    Appearance: Normal appearance. She is well-developed.  HENT:     Head: Normocephalic and atraumatic.     Right Ear: Hearing normal.     Left Ear: Hearing normal.     Nose: Nose normal.  Eyes:     Conjunctiva/sclera: Conjunctivae normal.     Pupils: Pupils are equal, round, and reactive to light.  Neck:     Musculoskeletal: Normal range of motion and neck supple.  Cardiovascular:     Rate and Rhythm: Regular rhythm.     Heart sounds: S1 normal and S2 normal. No murmur. No friction rub. No gallop.   Pulmonary:     Effort: Pulmonary effort is normal. No respiratory distress.     Breath sounds: Normal breath sounds.  Chest:     Chest wall: No tenderness.  Abdominal:     General: Bowel sounds are normal.     Palpations: Abdomen is soft.     Tenderness: There is no abdominal tenderness.  There is no guarding or rebound. Negative signs include Murphy's sign  and McBurney's sign.     Hernia: No hernia is present.  Musculoskeletal:     Right shoulder: She exhibits decreased range of motion (Has pain with bending in the past 45 degrees) and tenderness. She exhibits no effusion and no deformity.  Skin:    General: Skin is warm and dry.     Findings: No erythema or rash.  Neurological:     Mental Status: She is alert and oriented to person, place, and time.     GCS: GCS eye subscore is 4. GCS verbal subscore is 5. GCS motor subscore is 6.     Cranial Nerves: No cranial nerve deficit.     Sensory: No sensory deficit.     Coordination: Coordination normal.  Psychiatric:        Speech: Speech normal.        Behavior: Behavior normal.        Thought Content: Thought content normal.      ED Treatments / Results  Labs (all labs ordered are listed, but only abnormal results are displayed) Labs Reviewed  CBC WITH DIFFERENTIAL/PLATELET - Abnormal; Notable for the following components:      Result Value   WBC 11.1 (*)    Hemoglobin 11.7 (*)    Platelets 423 (*)    Abs Immature Granulocytes 0.10 (*)    All other components within normal limits  BASIC METABOLIC PANEL - Abnormal; Notable for the following components:   Glucose, Bld 107 (*)    All other components within normal limits  SEDIMENTATION RATE - Abnormal; Notable for the following components:   Sed Rate 31 (*)    All other components within normal limits  C-REACTIVE PROTEIN - Abnormal; Notable for the following components:   CRP 1.2 (*)    All other components within normal limits    EKG None  Radiology Dg Knee Complete 4 Views Left  Result Date: 04/26/2018 CLINICAL DATA:  Left knee pain/swelling x3 days, no known injury EXAM: LEFT KNEE - COMPLETE 4+ VIEW COMPARISON:  None. FINDINGS: No fracture or dislocation is seen. Mild degenerative changes, most prominent in the patellofemoral compartment. Small  suprapatellar knee joint effusion. IMPRESSION: Mild degenerative changes with small suprapatellar knee joint effusion. Electronically Signed   By: Julian Hy M.D.   On: 04/26/2018 23:32    Procedures Procedures (including critical care time)  Medications Ordered in ED Medications  oxyCODONE-acetaminophen (PERCOCET/ROXICET) 5-325 MG per tablet 1 tablet (1 tablet Oral Given 04/27/18 0233)  ibuprofen (ADVIL,MOTRIN) tablet 800 mg (800 mg Oral Given 04/27/18 0233)     Initial Impression / Assessment and Plan / ED Course  I have reviewed the triage vital signs and the nursing notes.  Pertinent labs & imaging results that were available during my care of the patient were reviewed by me and considered in my medical decision making (see chart for details).     Patient presents with nontraumatic left knee pain.  She has a history of arthritis, reports that she usually has problems with the right knee not the left.  Examination does not reveal any obvious swelling.  There is no joint effusion on x-ray or by palpating.  There is no overlying erythema.  Because the patient was on a course of prednisone just over a week ago, infection can be considered, however she does not have any risk for infection.  Her white count is 11.  Sed rate and CRP are just slightly elevated.  Again, there is no effusion and therefore  I cannot perform arthrocentesis for further evaluation.  This is likely secondary to her osteoarthritis and increased walking.  Without effusion, I cannot further evaluate for infection and gout, both of which are very unlikely.  She will therefore follow-up with her orthopedic doctor at [facility] as soon as possible.  She was told to return to the ER for fever, increased swelling or redness.  Final Clinical Impressions(s) / ED Diagnoses   Final diagnoses:  Acute pain of left knee  Arthritis    ED Discharge Orders         Ordered    traMADol (ULTRAM) 50 MG tablet  Every 6 hours PRN      04/27/18 0414    meloxicam (MOBIC) 15 MG tablet  Daily     04/27/18 0414           Orpah Greek, MD 04/27/18 [phone]

## 2018-04-27 NOTE — ED Notes (Signed)
Pt verbalized understanding of dc instructions, taken to waiting room via wheelchair to wait for ride

## 2018-04-28 ENCOUNTER — Emergency Department (HOSPITAL_COMMUNITY)
Admission: EM | Admit: 2018-04-28 | Discharge: 2018-04-28 | Disposition: A | Discharge status: Home / Self Care | Payer: Medicaid Other | Attending: Emergency Medicine | Admitting: Emergency Medicine

## 2018-04-28 DIAGNOSIS — S79921A Unspecified injury of right thigh, initial encounter: Secondary | ICD-10-CM | POA: Diagnosis present

## 2018-04-28 DIAGNOSIS — Z91040 Latex allergy status: Secondary | ICD-10-CM | POA: Diagnosis not present

## 2018-04-28 DIAGNOSIS — Y929 Unspecified place or not applicable: Secondary | ICD-10-CM | POA: Diagnosis not present

## 2018-04-28 DIAGNOSIS — Y999 Unspecified external cause status: Secondary | ICD-10-CM | POA: Diagnosis not present

## 2018-04-28 DIAGNOSIS — X500XXA Overexertion from strenuous movement or load, initial encounter: Secondary | ICD-10-CM | POA: Diagnosis not present

## 2018-04-28 DIAGNOSIS — Z79899 Other long term (current) drug therapy: Secondary | ICD-10-CM | POA: Diagnosis not present

## 2018-04-28 DIAGNOSIS — J45909 Unspecified asthma, uncomplicated: Secondary | ICD-10-CM | POA: Insufficient documentation

## 2018-04-28 DIAGNOSIS — Y93E2 Activity, laundry: Secondary | ICD-10-CM | POA: Diagnosis not present

## 2018-04-28 DIAGNOSIS — I10 Essential (primary) hypertension: Secondary | ICD-10-CM | POA: Diagnosis not present

## 2018-04-28 DIAGNOSIS — S76911A Strain of unspecified muscles, fascia and tendons at thigh level, right thigh, initial encounter: Secondary | ICD-10-CM | POA: Diagnosis not present

## 2018-04-28 MED ORDER — CYCLOBENZAPRINE HCL 10 MG PO TABS: 10.0000 mg | ORAL_TABLET | Freq: Three times a day (TID) | ORAL | 0 refills | Status: DC | PRN

## 2018-04-28 NOTE — ED Notes (Signed)
Patient verbalized understanding of discharge instructions and denies any further needs or questions at this time. VS stable. Patient ambulatory with steady gait. Assisted to vehicle in wheelchair.   

## 2018-04-28 NOTE — ED Provider Notes (Signed)
[facility] EMERGENCY DEPARTMENT Provider Note   CSN: [id number] Arrival date & time: 04/28/18  1546     History   Chief Complaint Chief Complaint  Patient presents with  . Leg Pain    HPI Margaret Owens is a 48 y.o. female.  48 year old female with past medical history below including sciatica who presents with right thigh pain.  This afternoon, she was putting clothes in the dryer and was bent over, then straightened up and had a sudden onset of severe right anterior thigh pain.  It felt like a muscle cramp but more severe.  Pain goes from the top of her hip down to just above her knee.  She states that she almost fell because of the pain but did not actually fall and she denies any trauma.  She notes that she has been having problems with her left leg recently, has been walking with a cane.  She denies any joint swelling.  No fevers.  The history is provided by the patient.  Leg Pain  Associated symptoms: no fever     Past Medical History:  Diagnosis Date  . Anxiety   . Arthritis   . Asthma   . Bursitis   . Depression   . GERD (gastroesophageal reflux disease)   . Hypertension   . Muscle spasms of both lower extremities   . PTSD (post-traumatic stress disorder)   . Sciatica   . Uterine fibroid     Patient Active Problem List   Diagnosis Date Noted  . Uterine fibroid 06/09/2017  . 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 colonoscopy       OB History    Gravida  3   Para  3   Term  3   Preterm      AB      Living  3     SAB  0   TAB  0   Ectopic  0   Multiple  0   Live Births  3            Home Medications    Prior to Admission medications   Medication Sig Start Date End Date Taking? Authorizing Provider  albuterol (PROVENTIL HFA;VENTOLIN HFA) 108 (90 Base) MCG/ACT inhaler Inhale 2 puffs into the lungs every 6 (six) hours as needed for wheezing (wheezing).  04/18/18   Fulp, Cammie, MD  azithromycin (ZITHROMAX) 250 MG tablet Take 2 pills on the first day then 1 pill daily x4 days 04/18/18   Antony Blackbird, MD  budesonide-formoterol (SYMBICORT) 160-4.5 MCG/ACT inhaler 2 puffs twice daily to control asthma 04/18/18   Fulp, Cammie, MD  cetirizine (ZYRTEC) 10 MG tablet Take 1 tablet (10 mg total) by mouth daily. 12/17/17   Fulp, Cammie, MD  citalopram (CELEXA) 10 MG tablet Take 1 tablet (10 mg total) by mouth daily. 12/17/17   Fulp, Cammie, MD  clotrimazole (LOTRIMIN) 1 % cream Apply to affected area 2 times daily 10/09/17   Charlann Lange, PA-C  cyclobenzaprine (FLEXERIL) 10 MG tablet Take 1 tablet (10 mg total) by mouth 3 (three) times daily as needed for muscle spasms. 04/28/18   Little, Wenda Overland, MD  desonide (DESOWEN) 0.05 % cream Apply 1 application topically 2 (two) times daily.    [provider]  diclofenac sodium (VOLTAREN) 1 % GEL Apply 2 g topically 4 (four) times daily as needed (apply to leg). 04/18/18   Antony Blackbird, MD  fluticasone (FLONASE) 50 MCG/ACT nasal spray Place 2 sprays into both nostrils daily. 12/17/17   Fulp, Cammie, MD  gabapentin (NEURONTIN) 300 MG capsule Take 2 pills twice per day and 2 pills at bedtime for nerve pain 04/18/18   Fulp, Cammie, MD  meloxicam (MOBIC) 15 MG tablet Take 1 tablet (15 mg total) by mouth daily. 04/27/18   Orpah Greek, MD  methocarbamol (ROBAXIN) 500 MG tablet Take 1 tablet (500 mg total) by mouth at bedtime as needed for muscle spasms. 12/01/17   Jorje Guild, NP  pantoprazole (PROTONIX) 40 MG tablet Take 1 tablet (40 mg total) by mouth 2 (two) times daily. 12/17/17   Fulp, Cammie, MD  phentermine 37.5 MG capsule Take 37.5 mg by mouth every morning.    [provider]  polyethylene glycol (MIRALAX) packet Take 17 g by mouth daily as needed. 12/17/17   Fulp, Cammie, MD  predniSONE (DELTASONE) 20 MG tablet 2 pills once daily x5 days.  Take after eating 04/18/18   Fulp, Cammie, MD  tiZANidine  (ZANAFLEX) 4 MG tablet Take 1 tablet (4 mg total) by mouth at bedtime. As needed for muscle spasm 04/18/18   Fulp, Cammie, MD  traMADol (ULTRAM) 50 MG tablet Take 1 tablet (50 mg total) by mouth every 6 (six) hours as needed. 04/27/18   Orpah Greek, MD  traZODone (DESYREL) 50 MG tablet Take 50 mg by mouth at bedtime as needed for sleep (sleep).     [provider]  Vitamin D, Ergocalciferol, (DRISDOL) 50000 UNITS CAPS capsule Take 50,000 Units by mouth every 7 (seven) days.    [provider]    Family History Family History  Problem Relation Age of Onset  . Osteoarthritis Mother   . Breast cancer Maternal Grandmother        spread to lungs     Social History Social History   Tobacco Use  . Smoking status: Never Smoker  . Smokeless tobacco: Never Used  Substance Use Topics  . Alcohol use: Yes    Comment: 1/week  . Drug use: No     Allergies   Latex and Penicillins   Review of Systems Review of Systems  Constitutional: Negative for fever.  Musculoskeletal: Positive for myalgias. Negative for joint swelling.  Skin: Negative for rash and wound.  Neurological: Negative for numbness.     Physical Exam Updated Vital Signs BP 129/65   Pulse 89   Temp 99.4 F (37.4 C) (Oral)   Resp 17   LMP 04/07/2018   SpO2 100%   Physical Exam Vitals signs and nursing note reviewed.  Constitutional:      General: She is not in acute distress.    Appearance: She is well-developed.  HENT:     Head: Normocephalic and atraumatic.  Eyes:     Conjunctiva/sclera: Conjunctivae normal.  Neck:     Musculoskeletal: Neck supple.  Cardiovascular:     Pulses: Normal pulses.  Musculoskeletal:        General: No swelling, tenderness, deformity or signs of injury.     Comments: No lower extremity edema or focal joint swelling or tenderness; 2+ DP pulse; normal sensation; no focal thigh tenderness  Skin:    General: Skin is warm and dry.  Neurological:     Mental  Status: She is alert and oriented to person, place, and time.  Psychiatric:        Judgment: Judgment normal.      ED Treatments / Results  Labs (  all labs ordered are listed, but only abnormal results are displayed) Labs Reviewed - No data to display  EKG None  Radiology Dg Knee Complete 4 Views Left  Result Date: 04/26/2018 CLINICAL DATA:  Left knee pain/swelling x3 days, no known injury EXAM: LEFT KNEE - COMPLETE 4+ VIEW COMPARISON:  None. FINDINGS: No fracture or dislocation is seen. Mild degenerative changes, most prominent in the patellofemoral compartment. Small suprapatellar knee joint effusion. IMPRESSION: Mild degenerative changes with small suprapatellar knee joint effusion. Electronically Signed   By: Julian Hy M.D.   On: 04/26/2018 23:32    Procedures Procedures (including critical care time)  Medications Ordered in ED Medications - No data to display   Initial Impression / Assessment and Plan / ED Course  I have reviewed the triage vital signs and the nursing notes.  Pertinent labs & imaging results that were available during my care of the patient were reviewed by me and considered in my medical decision making (see chart for details).    Suspect muscle strain or cramp 2/2 twisting motion or possibly because she's been compensating for L leg pain with more weight on R leg. Gave muscle relaxant to use prn, cautioned on side effects.  Final Clinical Impressions(s) / ED Diagnoses   Final diagnoses:  Muscle strain of right thigh, initial encounter    ED Discharge Orders         Ordered    cyclobenzaprine (FLEXERIL) 10 MG tablet  3 times daily PRN     04/28/18 1730           Little, Wenda Overland, MD 04/28/18 1734

## 2018-04-28 NOTE — ED Triage Notes (Signed)
Pt endorses muscular pain to right upper leg that began while pulling clothes out of the dryer. No obvious deformity, pt states that she feels like its her muscle. VSS.

## 2018-05-18 ENCOUNTER — Other Ambulatory Visit: Payer: Self-pay

## 2018-05-18 ENCOUNTER — Ambulatory Visit: Payer: Medicaid Other | Attending: Family Medicine

## 2018-05-18 DIAGNOSIS — M6281 Muscle weakness (generalized): Secondary | ICD-10-CM | POA: Diagnosis present

## 2018-05-18 DIAGNOSIS — M62830 Muscle spasm of back: Secondary | ICD-10-CM | POA: Diagnosis present

## 2018-05-18 DIAGNOSIS — R293 Abnormal posture: Secondary | ICD-10-CM | POA: Insufficient documentation

## 2018-05-18 DIAGNOSIS — G8929 Other chronic pain: Secondary | ICD-10-CM | POA: Insufficient documentation

## 2018-05-18 DIAGNOSIS — M545 Low back pain, unspecified: Secondary | ICD-10-CM

## 2018-05-18 NOTE — Therapy (Signed)
[place], [place], [address] Phone: [phone]   Fax:  [phone]  Physical Therapy Evaluation  Patient Details  Name: Margaret Owens MRN: [id number] Date of Birth: 1970-08-01 Referring Provider (PT): Antony Blackbird, MD   Encounter Date: 05/18/2018  PT End of Session - 05/18/18 1219    Visit Number  1    Number of Visits  12    Date for PT Re-Evaluation  07/01/18    Authorization Type  self pay    PT Start Time  1147    PT Stop Time  1230    PT Time Calculation (min)  43 min    Activity Tolerance  Patient limited by pain    Behavior During Therapy  [facility] for tasks assessed/performed       Past Medical History:  Diagnosis Date  . Anxiety   . Arthritis   . Asthma   . Bursitis   . Depression   . GERD (gastroesophageal reflux disease)   . Hypertension   . Muscle spasms of both lower extremities   . PTSD (post-traumatic stress disorder)   . Sciatica   . Uterine fibroid     Past Surgical History:  Procedure Laterality Date  . colonoscopy      There were no vitals filed for this visit.   Subjective Assessment - 05/18/18 1156    Subjective  She reports onset of back pain 10 years ago progressively worsening. No injury with onset.  Reports DDD.  Chair Yoga as able for HEP    Pertinent History  LT knee pain. hip bursitis, asthma, obesity, chronic pain syndrome, Was denied implanted spinal stimulator    Limitations  Lifting;Standing   squat , lying   How long can you sit comfortably?  sits most time but gets stiff.     How long can you stand comfortably?  5 min    How long can you walk comfortably?  not sure    Diagnostic tests  MRI/ Xrays:   DDD , anterior listhesis , narrowing  foramina    Patient Stated Goals  Lear exer to manage pain    Currently in Pain?  Yes    Pain Score  9     Pain Location  Back    Pain Orientation  Right;Left;Lower    Pain Descriptors / Indicators  Sharp;Throbbing     Pain Type  Chronic pain    Pain Radiating Towards  bilateral lateral thighs    Pain Onset  More than a month ago    Pain Frequency  Constant    Aggravating Factors   bending , walking, standing , sitting    Pain Relieving Factors  change position , rubs, TENS unit          [facility] PT Assessment - 05/18/18 0001      Assessment   Medical Diagnosis  chronic bilateral LBP with bilateral sciatica    Referring Provider (PT)  Antony Blackbird, MD    Onset Date/Surgical Date  --   10 years   Next MD Visit  later in Feb    Prior Therapy  No      Precautions   Precautions  None      Restrictions   Weight Bearing Restrictions  No      Balance Screen   Has the patient fallen in the past 6 months  Yes    How many times?  1   in shower but uses shower  chair now     Prior Function   Level of Independence  Needs assistance with ADLs;Needs assistance with homemaking    Vocation  Unemployed      Cognition   Overall Cognitive Status  Within Functional Limits for tasks assessed      Observation/Other Assessments   Focus on Therapeutic Outcomes (FOTO)   64% limited      Posture/Postural Control   Posture Comments  flexed at hips and knee, increased lordosis. , Lt shoulder higher and head tilted RT.       ROM / Strength   AROM / PROM / Strength  AROM;PROM;Strength      AROM   AROM Assessment Site  Lumbar    Lumbar Flexion  35    Lumbar Extension  -30 degrees    Lumbar - Right Side Bend  10    Lumbar - Left Side Bend  10      PROM   Overall PROM Comments  Lt hip limited by hip and knee pain all planes  RT hip Passively WNL      Strength   Overall Strength Comments  poor abdoominal strength    Strength Assessment Site  Ankle;Knee;Hip    Right/Left Hip  Right;Left    Right Hip Flexion  4/5    Right Hip External Rotation   3+/5    Right Hip Internal Rotation  3+/5    Right Hip ABduction  3+/5    Left Hip Flexion  4/5    Left Hip External Rotation  3+/5    Left Hip Internal  Rotation  3+/5    Left Hip ABduction  3+/5    Right/Left Knee  Right;Left    Right Knee Flexion  4/5    Right Knee Extension  4/5    Left Knee Flexion  3+/5    Left Knee Extension  3-/5    Right/Left Ankle  Right;Left    Right Ankle Dorsiflexion  4/5    Left Ankle Dorsiflexion  4-/5      Palpation   Palpation comment  tender across lower back and into bilateral lateral and anterior thighs and anterior Lt knee      Ambulation/Gait   Gait Comments  Walks SPC with flexed posture decr weight to LT leg due to Lt knee pain.                 Objective measurements completed on examination: See above findings.              PT Education - 05/18/18 1219    Education Details  POC    Person(s) Educated  Patient    Methods  Explanation    Comprehension  Verbalized understanding       PT Short Term Goals - 05/18/18 1233      PT SHORT TERM GOAL #1   Title  She will be indpendent with intinal HEP     Time  3    Period  Weeks    Status  New        PT Long Term Goals - 05/18/18 1233      PT LONG TERM GOAL #1   Title  She will be indpendent with all HEp issued    Time  6    Period  Weeks    Status  New      PT LONG TERM GOAL #2   Title  She will report pain decreased 30-40% with walking and home tasks.  Time  6    Period  Weeks    Status  New      PT LONG TERM GOAL #3   Title  She will demo improved posture with flexed posture decreased     Time  6    Period  Weeks    Status  New      PT LONG TERM GOAL #4   Title  Lt knee pain decr 40% or more and able to flex to 120 degrees to decr pain with walking and trasitions    Time  6    Period  Weeks    Status  New      PT LONG TERM GOAL #5   Title  FOTO will decrease from 64% limted to 50% limited demo perceived improvement in function    Time  6    Period  Weeks    Status  New             Plan - 05/18/18 1220    Clinical Impression Statement  Ms Karis presents with long history of chronic  pain with paininot  thighs and also with recent Lt knee pain without injury. She was seen this past   June 2019 for 1 session for HEP. as she was self pay then. She appears wrose now with more weakness in LE and decreased spine ROm and worse posture.   She reports trying yoga as able.    She has limited postential but is sorth a 4-6 week trial of skilled PT to see if she can make some progress.     History and Personal Factors relevant to plan of care:  obesity , Chronic pain syndrome, Lt knee pain , significant degenerative changes in lower spine    Clinical Presentation  Unstable    Clinical Presentation due to:  chronic back pain    Clinical Decision Making  Moderate    Rehab Potential  Fair    Clinical Impairments Affecting Rehab Potential  multiple co morbidities and  10 years history of lower back pain    PT Frequency  2x / week    PT Duration  6 weeks    PT Treatment/Interventions  Passive range of motion;Manual techniques;Patient/family education;Therapeutic exercise;Iontophoresis 4mg /ml Dexamethasone;Moist Heat;Electrical Stimulation    PT Next Visit Plan  Manual for ROM and spasm.  modalities.   Core strength    Consulted and Agree with Plan of Care  Patient       Patient will benefit from skilled therapeutic intervention in order to improve the following deficits and impairments:  Obesity, Pain, Postural dysfunction, Impaired flexibility, Decreased strength, Decreased activity tolerance, Difficulty walking, Decreased range of motion, Increased muscle spasms  Visit Diagnosis: Chronic bilateral low back pain, unspecified whether sciatica present - Plan: PT plan of care cert/re-cert  Abnormal posture - Plan: PT plan of care cert/re-cert  Muscle spasm of back - Plan: PT plan of care cert/re-cert  Muscle weakness (generalized) - Plan: PT plan of care cert/re-cert     Problem List Patient Active Problem List   Diagnosis Date Noted  . Uterine fibroid 06/09/2017  . Endometrial  stromal nodule 06/09/2017    Darrel Hoover  PT 05/18/2018, 1:45 PM  [facility] [address] [place], [place], [address] Phone: [phone]   Fax:  [phone]  Name: TARISSA KERIN MRN: [id number] Date of Birth: [birth year]

## 2018-05-24 ENCOUNTER — Ambulatory Visit: Payer: Medicaid Other

## 2018-05-24 DIAGNOSIS — M6281 Muscle weakness (generalized): Secondary | ICD-10-CM

## 2018-05-24 DIAGNOSIS — G8929 Other chronic pain: Secondary | ICD-10-CM

## 2018-05-24 DIAGNOSIS — M545 Low back pain, unspecified: Secondary | ICD-10-CM

## 2018-05-24 DIAGNOSIS — M62830 Muscle spasm of back: Secondary | ICD-10-CM

## 2018-05-24 DIAGNOSIS — R293 Abnormal posture: Secondary | ICD-10-CM

## 2018-05-24 NOTE — Patient Instructions (Signed)
Bent knee raise x 10 RT /LT  abdominal set and gluteal squeeze x 10 each     LTR x 10 RT and Lt all 2x/day

## 2018-05-24 NOTE — Therapy (Signed)
[place], [place], [address] Phone: [phone]   Fax:  [phone]  Physical Therapy Treatment  Patient Details  Name: Margaret Owens MRN: [id number] Date of Birth: [birth year] Referring Provider (PT): Antony Blackbird, MD   Encounter Date: 05/24/2018  PT End of Session - 05/24/18 1154    Visit Number  2    Number of Visits  12    Date for PT Re-Evaluation  07/01/18    Authorization Type  self pay    PT Start Time  1147    PT Stop Time  1240    PT Time Calculation (min)  53 min    Activity Tolerance  Patient limited by pain    Behavior During Therapy  [facility] for tasks assessed/performed       Past Medical History:  Diagnosis Date  . Anxiety   . Arthritis   . Asthma   . Bursitis   . Depression   . GERD (gastroesophageal reflux disease)   . Hypertension   . Muscle spasms of both lower extremities   . PTSD (post-traumatic stress disorder)   . Sciatica   . Uterine fibroid     Past Surgical History:  Procedure Laterality Date  . colonoscopy      There were no vitals filed for this visit.  Subjective Assessment - 05/24/18 1155    Subjective  No changes.      Pain Score  9     Pain Location  Back    Pain Orientation  Right;Left;Lower                       [facility] Adult PT Treatment/Exercise - 05/24/18 0001      Exercises   Exercises  Lumbar      Lumbar Exercises: Stretches   Lower Trunk Rotation Limitations  15 reps RT/LT legs on red ball    Gastroc Stretch  Right;Left;2 reps;30 seconds    Gastroc Stretch Limitations  with strap      Lumbar Exercises: Aerobic   Nustep  LE only L3 4 min      Lumbar Exercises: Seated   Long Arc Quad on Chair  Right;Left;10 reps      Lumbar Exercises: Supine   Ab Set  10 reps    Pelvic Tilt  10 reps    Glut Set  10 reps    Clam  10 reps    Clam Limitations  RT/LT    Bent Knee Raise  10 reps    Bent Knee Raise Limitations  RT/LT    Bridge  10 reps    Other Supine Lumbar Exercises  green band abduction /ER legs on ball x 15    Other Supine Lumbar Exercises  lift head and shoulders slide hands on thighs x10 then hor abduction  green band x 15      Modalities   Modalities  Moist Heat      Moist Heat Therapy   Number Minutes Moist Heat  12 Minutes    Moist Heat Location  Lumbar Spine             PT Education - 05/24/18 1224    Education Details  HEP    Person(s) Educated  Patient    Methods  Explanation;Handout;Verbal cues;Tactile cues    Comprehension  Verbalized understanding;Returned demonstration       PT Short Term Goals - 05/18/18 1233      PT SHORT TERM  GOAL #1   Title  She will be indpendent with intinal HEP     Time  3    Period  Weeks    Status  New        PT Long Term Goals - 05/18/18 1233      PT LONG TERM GOAL #1   Title  She will be indpendent with all HEp issued    Time  6    Period  Weeks    Status  New      PT LONG TERM GOAL #2   Title  She will report pain decreased 30-40% with walking and home tasks.     Time  6    Period  Weeks    Status  New      PT LONG TERM GOAL #3   Title  She will demo improved posture with flexed posture decreased     Time  6    Period  Weeks    Status  New      PT LONG TERM GOAL #4   Title  Lt knee pain decr 40% or more and able to flex to 120 degrees to decr pain with walking and trasitions    Time  6    Period  Weeks    Status  New      PT LONG TERM GOAL #5   Title  FOTO will decrease from 64% limted to 50% limited demo perceived improvement in function    Time  6    Period  Weeks    Status  New            Plan - 05/24/18 1154    Clinical Impression Statement  She did well with exercises though needed modifications due to limited ROM due to pain and swelling.Marland Kitchen      PT Treatment/Interventions  Passive range of motion;Manual techniques;Patient/family education;Therapeutic exercise;Iontophoresis 4mg /ml Dexamethasone;Moist  Heat;Electrical Stimulation    PT Next Visit Plan  Review HEP. add as needed Manual for ROM and spasm.  modalities.   Core strength    PT Home Exercise Plan  LTR , abdominal and glut sets, bent knee raises    Consulted and Agree with Plan of Care  Patient       Patient will benefit from skilled therapeutic intervention in order to improve the following deficits and impairments:  Obesity, Pain, Postural dysfunction, Impaired flexibility, Decreased strength, Decreased activity tolerance, Difficulty walking, Decreased range of motion, Increased muscle spasms  Visit Diagnosis: Chronic bilateral low back pain, unspecified whether sciatica present  Abnormal posture  Muscle spasm of back  Muscle weakness (generalized)     Problem List Patient Active Problem List   Diagnosis Date Noted  . Uterine fibroid 06/09/2017  . Endometrial stromal nodule 06/09/2017    Darrel Hoover   PT 05/24/2018, 12:26 PM  [facility] [address] [place], [place], [address] Phone: [phone]   Fax:  [phone]  Name: Margaret Owens MRN: [id number] Date of Birth: 06/08/1970

## 2018-05-26 ENCOUNTER — Ambulatory Visit: Payer: Medicaid Other

## 2018-05-26 DIAGNOSIS — M62830 Muscle spasm of back: Secondary | ICD-10-CM

## 2018-05-26 DIAGNOSIS — M545 Low back pain, unspecified: Secondary | ICD-10-CM

## 2018-05-26 DIAGNOSIS — R293 Abnormal posture: Secondary | ICD-10-CM

## 2018-05-26 DIAGNOSIS — G8929 Other chronic pain: Secondary | ICD-10-CM

## 2018-05-26 DIAGNOSIS — M6281 Muscle weakness (generalized): Secondary | ICD-10-CM

## 2018-05-26 NOTE — Therapy (Signed)
[place], [place], [address] Phone: [phone]   Fax:  [phone]  Physical Therapy Treatment  Patient Details  Name: Margaret Owens MRN: [id number] Date of Birth: 1970/10/26 Referring Provider (PT): Antony Blackbird, MD   Encounter Date: 05/26/2018  PT End of Session - 05/26/18 1012    Visit Number  3    Number of Visits  12    Date for PT Re-Evaluation  07/01/18    Authorization Type  self pay    PT Start Time  0845    PT Stop Time  0940    PT Time Calculation (min)  55 min    Activity Tolerance  Patient tolerated treatment well    Behavior During Therapy  [facility] for tasks assessed/performed       Past Medical History:  Diagnosis Date  . Anxiety   . Arthritis   . Asthma   . Bursitis   . Depression   . GERD (gastroesophageal reflux disease)   . Hypertension   . Muscle spasms of both lower extremities   . PTSD (post-traumatic stress disorder)   . Sciatica   . Uterine fibroid     Past Surgical History:  Procedure Laterality Date  . colonoscopy      There were no vitals filed for this visit.  Subjective Assessment - 05/26/18 0911    Subjective  LT knee pain limiting activity.       Currently in Pain?  Yes    Pain Score  8     Pain Location  Back    Pain Orientation  Right;Left;Lower    Pain Descriptors / Indicators  Throbbing;Sharp    Pain Type  Chronic pain    Pain Radiating Towards  thighs    Pain Onset  More than a month ago    Pain Frequency  Constant                       [facility] Adult PT Treatment/Exercise - 05/26/18 0001      Lumbar Exercises: Aerobic   Nustep  LE only L4    7 min      Lumbar Exercises: Supine   Glut Set  15 reps    Glut Set Limitations  with hor abduct geen bands pulls    Clam  20 reps    Clam Limitations  bilateral legs on ball green band    Bent Knee Raise  10 reps    Bent Knee Raise Limitations  RT/LT    Bridge  15 reps    Bridge  Limitations  legs on ball    Other Supine Lumbar Exercises  green band extension x 15 bilateal     Other Supine Lumbar Exercises  lift head and shoulders slide hands on thighs x10 then hor abduction  green band x 15       MHP to back during and after session        PT Short Term Goals - 05/18/18 1233      PT SHORT TERM GOAL #1   Title  She will be indpendent with intinal HEP     Time  3    Period  Weeks    Status  New        PT Long Term Goals - 05/18/18 1233      PT LONG TERM GOAL #1   Title  She will be indpendent with all HEp issued    Time  6    Period  Weeks    Status  New      PT LONG TERM GOAL #2   Title  She will report pain decreased 30-40% with walking and home tasks.     Time  6    Period  Weeks    Status  New      PT LONG TERM GOAL #3   Title  She will demo improved posture with flexed posture decreased     Time  6    Period  Weeks    Status  New      PT LONG TERM GOAL #4   Title  Lt knee pain decr 40% or more and able to flex to 120 degrees to decr pain with walking and trasitions    Time  6    Period  Weeks    Status  New      PT LONG TERM GOAL #5   Title  FOTO will decrease from 64% limted to 50% limited demo perceived improvement in function    Time  6    Period  Weeks    Status  New            Plan - 05/26/18 1012    Clinical Impression Statement  She did well with mat exercises and may need to do more in sitting with band if tolerated. She likes the heat. Need to add to HEp    PT Treatment/Interventions  Passive range of motion;Manual techniques;Patient/family education;Therapeutic exercise;Iontophoresis 4mg /ml Dexamethasone;Moist Heat;Electrical Stimulation    PT Next Visit Plan  HEP. add as needed Manual for ROM and spasm.  modalities.   Core strength    PT Home Exercise Plan  LTR , abdominal and glut sets, bent knee raises    Consulted and Agree with Plan of Care  Patient       Patient will benefit from skilled therapeutic  intervention in order to improve the following deficits and impairments:  Obesity, Pain, Postural dysfunction, Impaired flexibility, Decreased strength, Decreased activity tolerance, Difficulty walking, Decreased range of motion, Increased muscle spasms  Visit Diagnosis: Chronic bilateral low back pain, unspecified whether sciatica present  Abnormal posture  Muscle spasm of back  Muscle weakness (generalized)     Problem List Patient Active Problem List   Diagnosis Date Noted  . Uterine fibroid 06/09/2017  . Endometrial stromal nodule 06/09/2017    Darrel Hoover  PT 05/26/2018, 10:14 AM  [facility] [address] [place], [place], [address] Phone: [phone]   Fax:  [phone]  Name: Margaret Owens MRN: [id number] Date of Birth: 04-15-1970

## 2018-05-30 ENCOUNTER — Ambulatory Visit: Payer: Medicaid Other | Admitting: Physical Therapy

## 2018-05-30 ENCOUNTER — Encounter: Payer: Self-pay | Admitting: Physical Therapy

## 2018-05-30 DIAGNOSIS — M62830 Muscle spasm of back: Secondary | ICD-10-CM

## 2018-05-30 DIAGNOSIS — G8929 Other chronic pain: Secondary | ICD-10-CM

## 2018-05-30 DIAGNOSIS — M545 Low back pain, unspecified: Secondary | ICD-10-CM

## 2018-05-30 DIAGNOSIS — M6281 Muscle weakness (generalized): Secondary | ICD-10-CM

## 2018-05-30 DIAGNOSIS — R293 Abnormal posture: Secondary | ICD-10-CM

## 2018-05-30 NOTE — Therapy (Signed)
[place], [place], [address] Phone: [phone]   Fax:  [phone]  Physical Therapy Treatment  Patient Details  Name: Margaret Owens MRN: [id number] Date of Birth: [birth year] Referring Provider (PT): Antony Blackbird, MD   Encounter Date: 05/30/2018  PT End of Session - 05/30/18 1114    Visit Number  4    Number of Visits  12    Date for PT Re-Evaluation  07/01/18    Authorization Type  self pay    PT Start Time  1107    PT Stop Time  1145    PT Time Calculation (min)  38 min       Past Medical History:  Diagnosis Date  . Anxiety   . Arthritis   . Asthma   . Bursitis   . Depression   . GERD (gastroesophageal reflux disease)   . Hypertension   . Muscle spasms of both lower extremities   . PTSD (post-traumatic stress disorder)   . Sciatica   . Uterine fibroid     Past Surgical History:  Procedure Laterality Date  . colonoscopy      There were no vitals filed for this visit.  Subjective Assessment - 05/30/18 1110    Subjective  I feel like I can walk a little better but the left knee stays painful and swollen.     Currently in Pain?  Yes    Pain Score  8     Pain Location  Back    Pain Orientation  Right;Left;Lower    Aggravating Factors   too much activity     Pain Relieving Factors  change positio                       [facility] Adult PT Treatment/Exercise - 05/30/18 0001      Lumbar Exercises: Stretches   Lower Trunk Rotation Limitations  15 reps RT/LT legs on red ball      Lumbar Exercises: Aerobic   Nustep  LE L4 x 5 minutes       Lumbar Exercises: Standing   Other Standing Lumbar Exercises  red band rows x 10- increased pain with standing located bilateral lateral thighs and left knee       Lumbar Exercises: Seated   Long Arc Quad on Chair  Right;Left;10 reps    LAQ on Chair Limitations  with abdominal draw in    Other Seated Lumbar Exercises  ball squeeze  with abdominal draw in, hip flexion with abdominal draw in, clam with abdominal draw in -added green band x 10       Lumbar Exercises: Supine   Ab Set  10 reps    Pelvic Tilt  10 reps    Pelvic Tilt Limitations  feet on ball     Glut Set Limitations   hor abduct geen bands pulls    Clam  20 reps    Clam Limitations  bilateral legs on ball green band    Bent Knee Raise  10 reps    Bent Knee Raise Limitations  RT/LT    Bridge  15 reps    Bridge Limitations  legs on ball      Moist Heat Therapy   Number Minutes Moist Heat  12 Minutes   during mat exercises.   Moist Heat Location  Lumbar Spine               PT Short Term Goals -  05/18/18 1233      PT SHORT TERM GOAL #1   Title  She will be indpendent with intinal HEP     Time  3    Period  Weeks    Status  New        PT Long Term Goals - 05/18/18 1233      PT LONG TERM GOAL #1   Title  She will be indpendent with all HEp issued    Time  6    Period  Weeks    Status  New      PT LONG TERM GOAL #2   Title  She will report pain decreased 30-40% with walking and home tasks.     Time  6    Period  Weeks    Status  New      PT LONG TERM GOAL #3   Title  She will demo improved posture with flexed posture decreased     Time  6    Period  Weeks    Status  New      PT LONG TERM GOAL #4   Title  Lt knee pain decr 40% or more and able to flex to 120 degrees to decr pain with walking and trasitions    Time  6    Period  Weeks    Status  New      PT LONG TERM GOAL #5   Title  FOTO will decrease from 64% limted to 50% limited demo perceived improvement in function    Time  6    Period  Weeks    Status  New            Plan - 05/30/18 1118    Clinical Impression Statement  Did well with seated therex today. Reports some improvement in walking tolerance however left knee still limits her. Attempted standing rows with increased pain so discontinued. Mat exercises performed with HMP under lumbar spine.     PT  Next Visit Plan  HEP. add as needed Manual for ROM and spasm.  modalities.   Core strength    PT Home Exercise Plan  LTR , abdominal and glut sets, bent knee raises    Consulted and Agree with Plan of Care  Patient       Patient will benefit from skilled therapeutic intervention in order to improve the following deficits and impairments:  Obesity, Pain, Postural dysfunction, Impaired flexibility, Decreased strength, Decreased activity tolerance, Difficulty walking, Decreased range of motion, Increased muscle spasms  Visit Diagnosis: Chronic bilateral low back pain, unspecified whether sciatica present  Abnormal posture  Muscle spasm of back  Muscle weakness (generalized)     Problem List Patient Active Problem List   Diagnosis Date Noted  . Uterine fibroid 06/09/2017  . Endometrial stromal nodule 06/09/2017    Dorene Ar, PTA 05/30/2018, 11:57 AM  [facility] [address] [place], [place], [address] Phone: [phone]   Fax:  [phone]  Name: Margaret Owens MRN: [id number] Date of Birth: 1970/07/19

## 2018-06-02 ENCOUNTER — Ambulatory Visit: Payer: Medicaid Other

## 2018-06-02 DIAGNOSIS — G8929 Other chronic pain: Secondary | ICD-10-CM

## 2018-06-02 DIAGNOSIS — M6281 Muscle weakness (generalized): Secondary | ICD-10-CM

## 2018-06-02 DIAGNOSIS — M545 Low back pain, unspecified: Secondary | ICD-10-CM

## 2018-06-02 DIAGNOSIS — R293 Abnormal posture: Secondary | ICD-10-CM

## 2018-06-02 DIAGNOSIS — M62830 Muscle spasm of back: Secondary | ICD-10-CM

## 2018-06-02 NOTE — Therapy (Signed)
[place], [place], [address] Phone: [phone]   Fax:  [phone]  Physical Therapy Treatment  Patient Details  Name: SIMRA FIEBIG MRN: [id number] Date of Birth: [birth year] Referring Provider (PT): Antony Blackbird, MD   Encounter Date: 06/02/2018  PT End of Session - 06/02/18 1219    Visit Number  5    Number of Visits  12    Date for PT Re-Evaluation  07/01/18    Authorization Type  self pay    PT Start Time  1145    PT Stop Time  1230    PT Time Calculation (min)  45 min    Activity Tolerance  Patient tolerated treatment well    Behavior During Therapy  [facility] for tasks assessed/performed       Past Medical History:  Diagnosis Date  . Anxiety   . Arthritis   . Asthma   . Bursitis   . Depression   . GERD (gastroesophageal reflux disease)   . Hypertension   . Muscle spasms of both lower extremities   . PTSD (post-traumatic stress disorder)   . Sciatica   . Uterine fibroid     Past Surgical History:  Procedure Laterality Date  . colonoscopy      There were no vitals filed for this visit.  Subjective Assessment - 06/02/18 1232    Subjective  Lt knee better but still limts time on feet.  LBP still same    Pain Score  8     Pain Location  Back    Multiple Pain Sites  Yes    Pain Score  5    Pain Location  Knee    Pain Orientation  Left    Pain Descriptors / Indicators  Aching    Pain Type  Chronic pain    Pain Onset  More than a month ago    Pain Frequency  Constant    Aggravating Factors   walking , being on feet    Pain Relieving Factors  rest , meds                        [facility] Adult PT Treatment/Exercise - 06/02/18 0001      Lumbar Exercises: Stretches   Lower Trunk Rotation Limitations  15 reps RT/LT legs on red ball      Lumbar Exercises: Aerobic   Nustep  LE L7 x 6 minutes       Lumbar Exercises: Standing   Other Standing Lumbar Exercises  green band  rows and shoulder ext and cross chest pulls RT/Lt x 12 each. Standing      Lumbar Exercises: Supine   Ab Set  10 reps    Bent Knee Raise  15 reps    Bent Knee Raise Limitations  RT/LT    Bridge  15 reps    Bridge Limitations  legs on ball    Other Supine Lumbar Exercises  ball roll up thighs x 10    Other Supine Lumbar Exercises  LE flexion /extension x 15 reps,   SAQ 3 pounds x 12 with ab set.       Moist Heat Therapy   Number Minutes Moist Heat  25 Minutes   with supine exercises   Moist Heat Location  Lumbar Spine               PT Short Term Goals - 06/02/18 1222  PT SHORT TERM GOAL #1   Title  She will be indpendent with intinal HEP     Status  Achieved        PT Long Term Goals - 06/02/18 1222      PT LONG TERM GOAL #1   Title  She will be independent with all HEp issued    Status  On-going      PT LONG TERM GOAL #2   Title  She will report pain decreased 30-40% with walking and home tasks.     Status  On-going      PT LONG TERM GOAL #3   Title  She will demo improved posture with flexed posture decreased     Status  On-going      PT LONG TERM GOAL #4   Title  Lt knee pain decr 40% or more and able to flex to 120 degrees to decr pain with walking and trasitions    Status  On-going      PT LONG TERM GOAL #5   Title  FOTO will decrease from 64% limted to 50% limited demo perceived improvement in function    Status  Unable to assess            Plan - 06/02/18 1220    Clinical Impression Statement  She reported knee pain improved but still limits walking . She did all exercise without stopping    Clinical Decision Making  Low    PT Treatment/Interventions  Passive range of motion;Manual techniques;Patient/family education;Therapeutic exercise;Iontophoresis 4mg /ml Dexamethasone;Moist Heat;Electrical Stimulation    PT Next Visit Plan  HEP. add as needed Manual for ROM and spasm.  modalities.   Core strength    PT Home Exercise Plan  LTR ,  abdominal and glut sets, bent knee raises    Consulted and Agree with Plan of Care  Patient       Patient will benefit from skilled therapeutic intervention in order to improve the following deficits and impairments:  Obesity, Pain, Postural dysfunction, Impaired flexibility, Decreased strength, Decreased activity tolerance, Difficulty walking, Decreased range of motion, Increased muscle spasms, Decreased scar mobility  Visit Diagnosis: Chronic bilateral low back pain, unspecified whether sciatica present  Abnormal posture  Muscle spasm of back  Muscle weakness (generalized)     Problem List Patient Active Problem List   Diagnosis Date Noted  . Uterine fibroid 06/09/2017  . 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

## 2018-06-06 ENCOUNTER — Ambulatory Visit: Payer: Medicaid Other | Admitting: Physical Therapy

## 2018-06-06 ENCOUNTER — Encounter: Payer: Self-pay | Admitting: Physical Therapy

## 2018-06-06 DIAGNOSIS — M62830 Muscle spasm of back: Secondary | ICD-10-CM

## 2018-06-06 DIAGNOSIS — M545 Low back pain, unspecified: Secondary | ICD-10-CM

## 2018-06-06 DIAGNOSIS — R293 Abnormal posture: Secondary | ICD-10-CM

## 2018-06-06 DIAGNOSIS — M6281 Muscle weakness (generalized): Secondary | ICD-10-CM

## 2018-06-06 DIAGNOSIS — G8929 Other chronic pain: Secondary | ICD-10-CM

## 2018-06-06 NOTE — Therapy (Signed)
[place], [place], [address] Phone: [phone]   Fax:  [phone]  Physical Therapy Treatment  Patient Details  Name: Margaret Owens MRN: [id number] Date of Birth: 1970/12/31 Referring Provider (PT): Antony Blackbird, MD   Encounter Date: 06/06/2018  PT End of Session - 06/06/18 1216    Visit Number  6    Number of Visits  12    Date for PT Re-Evaluation  07/01/18    Authorization Type  self pay    PT Start Time  8338    PT Stop Time  1230    PT Time Calculation (min)  45 min       Past Medical History:  Diagnosis Date  . Anxiety   . Arthritis   . Asthma   . Bursitis   . Depression   . GERD (gastroesophageal reflux disease)   . Hypertension   . Muscle spasms of both lower extremities   . PTSD (post-traumatic stress disorder)   . Sciatica   . Uterine fibroid     Past Surgical History:  Procedure Laterality Date  . colonoscopy      There were no vitals filed for this visit.                    [facility] Adult PT Treatment/Exercise - 06/06/18 0001      Lumbar Exercises: Aerobic   Nustep  LE L5 x 5 minutes       Lumbar Exercises: Standing   Other Standing Lumbar Exercises  bent over counter: alternating LE extensions with Tra contract     Other Standing Lumbar Exercises  green band rows and extensions x 15 each       Lumbar Exercises: Seated   Other Seated Lumbar Exercises  seated horizontal and ER red band       Lumbar Exercises: Supine   Pelvic Tilt  10 reps    Pelvic Tilt Limitations  feet on ball     Bent Knee Raise  15 reps    Bent Knee Raise Limitations  RT/LT    Bridge  15 reps    Bridge Limitations  legs on ball    Straight Leg Raise  10 reps      Moist Heat Therapy   Number Minutes Moist Heat  15 Minutes    Moist Heat Location  Lumbar Spine             PT Education - 06/06/18 1242    Education Details  HEP    Person(s) Educated  Patient     Methods  Explanation;Handout    Comprehension  Verbalized understanding       PT Short Term Goals - 06/02/18 1222      PT SHORT TERM GOAL #1   Title  She will be indpendent with intinal HEP     Status  Achieved        PT Long Term Goals - 06/02/18 1222      PT LONG TERM GOAL #1   Title  She will be independent with all HEp issued    Status  On-going      PT LONG TERM GOAL #2   Title  She will report pain decreased 30-40% with walking and home tasks.     Status  On-going      PT LONG TERM GOAL #3   Title  She will demo improved posture with flexed posture decreased     Status  On-going      PT LONG TERM GOAL #4   Title  Lt knee pain decr 40% or more and able to flex to 120 degrees to decr pain with walking and trasitions    Status  On-going      PT LONG TERM GOAL #5   Title  FOTO will decrease from 64% limted to 50% limited demo perceived improvement in function    Status  Unable to assess            Plan - 06/06/18 1223    Clinical Impression Statement  Pt demonstrates improved standing tolaerance for band exercises. Given green band for row and extensions at home. Continued with core. Added hip flexor stretch to increased left knee flexion. She is limited by left knee pain and is avoiding use. Educated on the need to increase strength and flexibility of left LE to allow for proper core strengthening.     PT Next Visit Plan  HEP. add as needed Manual for ROM and spasm.  modalities.   Core strength    PT Home Exercise Plan  LTR , abdominal and glut sets, bent knee raises, hip flexor stretch, standing rows and extension        Patient will benefit from skilled therapeutic intervention in order to improve the following deficits and impairments:  Obesity, Pain, Postural dysfunction, Impaired flexibility, Decreased strength, Decreased activity tolerance, Difficulty walking, Decreased range of motion, Increased muscle spasms, Decreased scar mobility  Visit  Diagnosis: Chronic bilateral low back pain, unspecified whether sciatica present  Muscle weakness (generalized)  Muscle spasm of back  Abnormal posture     Problem List Patient Active Problem List   Diagnosis Date Noted  . Uterine fibroid 06/09/2017  . 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 [place], [place], [address] Phone: [phone]   Fax:  [phone]  Name: Margaret Owens MRN: [id number] Date of Birth: 01/10/1971

## 2018-06-09 ENCOUNTER — Ambulatory Visit: Payer: Medicaid Other

## 2018-06-09 DIAGNOSIS — M545 Low back pain, unspecified: Secondary | ICD-10-CM

## 2018-06-09 DIAGNOSIS — R293 Abnormal posture: Secondary | ICD-10-CM

## 2018-06-09 DIAGNOSIS — G8929 Other chronic pain: Secondary | ICD-10-CM

## 2018-06-09 DIAGNOSIS — M6281 Muscle weakness (generalized): Secondary | ICD-10-CM

## 2018-06-09 DIAGNOSIS — M62830 Muscle spasm of back: Secondary | ICD-10-CM

## 2018-06-09 NOTE — Therapy (Signed)
[place], [place], [address] Phone: [phone]   Fax:  [phone]  Physical Therapy Treatment  Patient Details  Name: Margaret Owens MRN: [id number] Date of Birth: [birth year] Referring Provider (PT): Antony Blackbird, MD   Encounter Date: 06/09/2018  PT End of Session - 06/09/18 1228    Visit Number  7    Number of Visits  12    Date for PT Re-Evaluation  07/01/18    Authorization Type  self pay    PT Start Time  1150   late   PT Stop Time  1235    PT Time Calculation (min)  45 min    Activity Tolerance  Patient tolerated treatment well    Behavior During Therapy  [facility] for tasks assessed/performed       Past Medical History:  Diagnosis Date  . Anxiety   . Arthritis   . Asthma   . Bursitis   . Depression   . GERD (gastroesophageal reflux disease)   . Hypertension   . Muscle spasms of both lower extremities   . PTSD (post-traumatic stress disorder)   . Sciatica   . Uterine fibroid     Past Surgical History:  Procedure Laterality Date  . colonoscopy      There were no vitals filed for this visit.  Subjective Assessment - 06/09/18 1204    Subjective  Feel pretty good with a pep in my step today    Pain Score  5     Pain Location  Back    Pain Orientation  Right;Left;Lower    Pain Descriptors / Indicators  Throbbing    Pain Type  Chronic pain    Pain Radiating Towards  occasionally in thighs    Pain Onset  More than a month ago    Pain Frequency  Constant    Aggravating Factors   activity on feet    Pain Relieving Factors  changes position    Pain Score  4    Pain Location  Knee    Pain Orientation  Left    Pain Descriptors / Indicators  Aching    Pain Type  Chronic pain    Pain Onset  More than a month ago    Pain Frequency  Constant    Aggravating Factors   activity on feet    Pain Relieving Factors  rest, meds                       [facility] Adult PT  Treatment/Exercise - 06/09/18 0001      Lumbar Exercises: Stretches   Lower Trunk Rotation Limitations  RT x 12 reps      Lumbar Exercises: Aerobic   Nustep  UE/LE L5 6 min      Lumbar Exercises: Standing   Other Standing Lumbar Exercises  bent over counter: alternating LE extensions with Tra contract     Other Standing Lumbar Exercises   cross chest RT/Lt  x 15 each       Lumbar Exercises: Seated   Other Seated Lumbar Exercises  supine  horizontal and ER red band x15      Lumbar Exercises: Supine   Ab Set  10 reps    AB Set Limitations  10 sec    Pelvic Tilt  15 reps    Pelvic Tilt Limitations  feet on ball     Bent Knee Raise  15 reps    Bent  Knee Raise Limitations  RT/LT    Bridge  20 reps    Bridge Limitations  legs on ball    Other Supine Lumbar Exercises  part sit up x 15 slide hands on thighs.       Moist Heat Therapy   Number Minutes Moist Heat  --   during exercise on mat   Moist Heat Location  Lumbar Spine      Manual Therapy   Manual therapy comments  leg pulss x 100 Gr 2-3 osscilations               PT Short Term Goals - 06/02/18 1222      PT SHORT TERM GOAL #1   Title  She will be indpendent with intinal HEP     Status  Achieved        PT Long Term Goals - 06/09/18 1230      PT LONG TERM GOAL #1   Title  She will be independent with all HEp issued    Status  On-going      PT LONG TERM GOAL #2   Title  She will report pain decreased 30-40% with walking and home tasks.     Baseline  improved but variable    Status  On-going      PT LONG TERM GOAL #3   Title  She will demo improved posture with flexed posture decreased     Baseline  Still some flexed but can correct     Status  Achieved            Plan - 06/09/18 1229    Clinical Impression Statement  Less pain today . She continues to do the exercdses without complaint.  I expect she will have pain post discharge but if she does her HEP she should continue to improve function.      PT Treatment/Interventions  Passive range of motion;Manual techniques;Patient/family education;Therapeutic exercise;Iontophoresis 4mg /ml Dexamethasone;Moist Heat;Electrical Stimulation    PT Next Visit Plan  HEP. add as needed Manual for ROM and spasm.  modalities.   Core strength   FOTO    PT Home Exercise Plan  LTR , abdominal and glut sets, bent knee raises, hip flexor stretch, standing rows and extension     Consulted and Agree with Plan of Care  Patient       Patient will benefit from skilled therapeutic intervention in order to improve the following deficits and impairments:  Obesity, Pain, Postural dysfunction, Impaired flexibility, Decreased strength, Decreased activity tolerance, Difficulty walking, Decreased range of motion, Increased muscle spasms, Decreased scar mobility  Visit Diagnosis: Chronic bilateral low back pain, unspecified whether sciatica present  Muscle weakness (generalized)  Muscle spasm of back  Abnormal posture     Problem List Patient Active Problem List   Diagnosis Date Noted  . Uterine fibroid 06/09/2017  . 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 [place], [place], [address] Phone: [phone]   Fax:  [phone]  Name: Margaret Owens MRN: [id number] Date of Birth: 1971/03/23

## 2018-06-13 ENCOUNTER — Ambulatory Visit: Payer: Medicaid Other | Attending: Family Medicine | Admitting: Physical Therapy

## 2018-06-13 ENCOUNTER — Encounter: Payer: Self-pay | Admitting: Physical Therapy

## 2018-06-13 DIAGNOSIS — M545 Low back pain, unspecified: Secondary | ICD-10-CM

## 2018-06-13 DIAGNOSIS — M6281 Muscle weakness (generalized): Secondary | ICD-10-CM | POA: Insufficient documentation

## 2018-06-13 DIAGNOSIS — R293 Abnormal posture: Secondary | ICD-10-CM | POA: Diagnosis present

## 2018-06-13 DIAGNOSIS — M62830 Muscle spasm of back: Secondary | ICD-10-CM | POA: Insufficient documentation

## 2018-06-13 DIAGNOSIS — G8929 Other chronic pain: Secondary | ICD-10-CM | POA: Diagnosis present

## 2018-06-13 NOTE — Therapy (Signed)
[place], [place], [address] Phone: [phone]   Fax:  [phone]  Physical Therapy Treatment  Patient Details  Name: Margaret Owens MRN: [id number] Date of Birth: [birth year] Referring Provider (PT): Antony Blackbird, MD   Encounter Date: 06/13/2018  PT End of Session - 06/13/18 1149    Visit Number  8    Number of Visits  12    Date for PT Re-Evaluation  07/01/18    Authorization Type  self pay    PT Start Time  3762    PT Stop Time  1225    PT Time Calculation (min)  40 min       Past Medical History:  Diagnosis Date  . Anxiety   . Arthritis   . Asthma   . Bursitis   . Depression   . GERD (gastroesophageal reflux disease)   . Hypertension   . Muscle spasms of both lower extremities   . PTSD (post-traumatic stress disorder)   . Sciatica   . Uterine fibroid     Past Surgical History:  Procedure Laterality Date  . colonoscopy      There were no vitals filed for this visit.  Subjective Assessment - 06/13/18 1147    Subjective  Pain might be a 10/10 today. I had to do some things requiring me to be up for extended amount of time like church meetings.     Currently in Pain?  Yes    Pain Score  10-Worst pain ever    Pain Location  Back    Pain Orientation  Left;Lower    Pain Descriptors / Indicators  Throbbing   popping, irritating    Aggravating Factors   activity on feet, sit-stand     Pain Relieving Factors  change positions, rest          [facility] PT Assessment - 06/13/18 0001      Observation/Other Assessments   Focus on Therapeutic Outcomes (FOTO)   69% limited       Strength   Right Hip External Rotation   4-/5    Right Hip Internal Rotation  4-/5    Left Hip External Rotation  4-/5    Left Hip Internal Rotation  4-/5    Left Knee Flexion  3+/5    Left Knee Extension  4-/5                   [facility] Adult PT Treatment/Exercise - 06/13/18 0001      Lumbar  Exercises: Stretches   Lower Trunk Rotation Limitations  RT x 12 reps      Lumbar Exercises: Aerobic   Nustep  UE/LE L5 6 min      Lumbar Exercises: Standing   Other Standing Lumbar Exercises  green band rows and extensions x 15 each       Lumbar Exercises: Seated   Other Seated Lumbar Exercises  supine  horizontal and ER red band x15      Lumbar Exercises: Supine   Ab Set  10 reps    AB Set Limitations  10 sec    Pelvic Tilt  15 reps    Pelvic Tilt Limitations  feet on ball     Bent Knee Raise  15 reps    Bent Knee Raise Limitations  RT/LT    Bridge  20 reps    Bridge Limitations  legs on ball    Other Supine Lumbar Exercises  part sit up  x 15 slide hands on physioball    Other Supine Lumbar Exercises  ball squeeze with ab contract (feet over physioball)       Moist Heat Therapy   Number Minutes Moist Heat  --   during exercises on mat    Moist Heat Location  Lumbar Spine               PT Short Term Goals - 06/02/18 1222      PT SHORT TERM GOAL #1   Title  She will be indpendent with intinal HEP     Status  Achieved        PT Long Term Goals - 06/09/18 1230      PT LONG TERM GOAL #1   Title  She will be independent with all HEp issued    Status  On-going      PT LONG TERM GOAL #2   Title  She will report pain decreased 30-40% with walking and home tasks.     Baseline  improved but variable    Status  On-going      PT LONG TERM GOAL #3   Title  She will demo improved posture with flexed posture decreased     Baseline  Still some flexed but can correct     Status  Achieved            Plan - 06/13/18 1211    Clinical Impression Statement  Pain exacerbation today. Foto score worse. Discharge preparation for one more visit. Patient would like to continue physical therapy and will see Primary PT for last visit in her POC. Completed all exercises despite high level pain rating,     PT Next Visit Plan  Discharge verses ERO. HEP. add as needed Manual  for ROM and spasm.  modalities.   Core strength      PT Home Exercise Plan  LTR , abdominal and glut sets, bent knee raises, hip flexor stretch, standing rows and extension     Consulted and Agree with Plan of Care  Patient       Patient will benefit from skilled therapeutic intervention in order to improve the following deficits and impairments:  Obesity, Pain, Postural dysfunction, Impaired flexibility, Decreased strength, Decreased activity tolerance, Difficulty walking, Decreased range of motion, Increased muscle spasms, Decreased scar mobility  Visit Diagnosis: Chronic bilateral low back pain, unspecified whether sciatica present  Muscle weakness (generalized)  Muscle spasm of back  Abnormal posture     Problem List Patient Active Problem List   Diagnosis Date Noted  . Uterine fibroid 06/09/2017  . 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 [place], [place], [address] Phone: [phone]   Fax:  [phone]  Name: Margaret Owens MRN: [id number] Date of Birth: [birth year]

## 2018-06-16 ENCOUNTER — Ambulatory Visit: Payer: Medicaid Other

## 2018-06-16 DIAGNOSIS — M6281 Muscle weakness (generalized): Secondary | ICD-10-CM

## 2018-06-16 DIAGNOSIS — G8929 Other chronic pain: Secondary | ICD-10-CM

## 2018-06-16 DIAGNOSIS — R293 Abnormal posture: Secondary | ICD-10-CM

## 2018-06-16 DIAGNOSIS — M62830 Muscle spasm of back: Secondary | ICD-10-CM

## 2018-06-16 DIAGNOSIS — M545 Low back pain, unspecified: Secondary | ICD-10-CM

## 2018-06-16 NOTE — Therapy (Signed)
[place], [place], [address] Phone: [phone]   Fax:  [phone]  Physical Therapy Treatment  Patient Details  Name: Margaret Owens MRN: [id number] Date of Birth: March 26, 1971 Referring Provider (PT): Antony Blackbird, MD   Encounter Date: 06/16/2018  PT End of Session - 06/16/18 1155    Visit Number  9    Number of Visits  12    Date for PT Re-Evaluation  07/08/18    Authorization Type  self pay    PT Start Time  1145    PT Stop Time  1230    PT Time Calculation (min)  45 min    Activity Tolerance  Patient tolerated treatment well    Behavior During Therapy  [facility] for tasks assessed/performed       Past Medical History:  Diagnosis Date  . Anxiety   . Arthritis   . Asthma   . Bursitis   . Depression   . GERD (gastroesophageal reflux disease)   . Hypertension   . Muscle spasms of both lower extremities   . PTSD (post-traumatic stress disorder)   . Sciatica   . Uterine fibroid     Past Surgical History:  Procedure Laterality Date  . colonoscopy      There were no vitals filed for this visit.  Subjective Assessment - 06/16/18 1154    Subjective  8/10 pain today. Walking still a problem but tolerates standing more. Feel PT has helped .    How long can you sit comfortably?  sits most time but gets stiff.     How long can you stand comfortably?  10 min    Pain Score  4    Pain Location  Knee    Pain Orientation  Left                       [facility] Adult PT Treatment/Exercise - 06/16/18 0001      Lumbar Exercises: Stretches   Lower Trunk Rotation Limitations  RT/LtT x 15      Lumbar Exercises: Aerobic   Nustep  UE/LE L5 6 min      Lumbar Exercises: Standing   Other Standing Lumbar Exercises  green band rows and extensions x 15 each       Lumbar Exercises: Seated   Other Seated Lumbar Exercises  supine  horizontal and ER green band x15      Lumbar Exercises: Supine   Ab  Set  15 reps    Pelvic Tilt  15 reps;20 reps    Bent Knee Raise  20 reps    Bent Knee Raise Limitations  RT/LT    Bridge  20 reps    Bridge Limitations  legs on ball    Other Supine Lumbar Exercises  part sit up 2x 10 slide hands on physioball    Other Supine Lumbar Exercises  ball squeeze with ab contract  and clam green band x 20 with glut squeeze x 20.      Moist Heat Therapy   Number Minutes Moist Heat  --   during session   Moist Heat Location  Lumbar Spine               PT Short Term Goals - 06/02/18 1222      PT SHORT TERM GOAL #1   Title  She will be indpendent with intinal HEP     Status  Achieved  PT Long Term Goals - 06/09/18 1230      PT LONG TERM GOAL #1   Title  She will be independent with all HEp issued    Status  On-going      PT LONG TERM GOAL #2   Title  She will report pain decreased 30-40% with walking and home tasks.     Baseline  improved but variable    Status  On-going      PT LONG TERM GOAL #3   Title  She will demo improved posture with flexed posture decreased     Baseline  Still some flexed but can correct     Status  Achieved            Plan - 06/16/18 1156    Clinical Impression Statement  Pain 6/10 post session.   Again tolerated exercises and this time incr reps.  Feels PT has helped loosener and have a little more endurance.   She feels  knee has felt better  with stretching and exercises     PT Treatment/Interventions  Passive range of motion;Manual techniques;Patient/family education;Therapeutic exercise;Iontophoresis 4mg /ml Dexamethasone;Moist Heat;Electrical Stimulation    PT Next Visit Plan  As she feels Pt is helpoing will finish POC but do 1x/week for 3 weeks to do this to progress HEP     PT Home Exercise Plan  LTR , abdominal and glut sets, bent knee raises, hip flexor stretch, standing rows and extension     Consulted and Agree with Plan of Care  Patient       Patient will benefit from skilled  therapeutic intervention in order to improve the following deficits and impairments:  Obesity, Pain, Postural dysfunction, Impaired flexibility, Decreased strength, Decreased activity tolerance, Difficulty walking, Decreased range of motion, Increased muscle spasms, Decreased scar mobility  Visit Diagnosis: Chronic bilateral low back pain, unspecified whether sciatica present  Muscle weakness (generalized)  Muscle spasm of back  Abnormal posture     Problem List Patient Active Problem List   Diagnosis Date Noted  . Uterine fibroid 06/09/2017  . 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 [place], [place], [address] Phone: [phone]   Fax:  [phone]  Name: Margaret Owens MRN: [id number] Date of Birth: [birth year]

## 2018-06-16 NOTE — Addendum Note (Signed)
Addended by: Darrel Hoover on: 06/16/2018 12:35 PM   Modules accepted: Orders

## 2018-06-20 ENCOUNTER — Ambulatory Visit: Payer: Medicaid Other

## 2018-06-20 DIAGNOSIS — M545 Low back pain, unspecified: Secondary | ICD-10-CM

## 2018-06-20 DIAGNOSIS — G8929 Other chronic pain: Secondary | ICD-10-CM

## 2018-06-20 DIAGNOSIS — R293 Abnormal posture: Secondary | ICD-10-CM

## 2018-06-20 DIAGNOSIS — M6281 Muscle weakness (generalized): Secondary | ICD-10-CM

## 2018-06-20 DIAGNOSIS — M62830 Muscle spasm of back: Secondary | ICD-10-CM

## 2018-06-20 NOTE — Therapy (Signed)
[place], [place], [address] Phone: [phone]   Fax:  [phone]  Physical Therapy Treatment  Patient Details  Name: Margaret Owens MRN: [id number] Date of Birth: October 30, 1970 Referring Provider (PT): Antony Blackbird, MD  Progress Note Reporting Period  05/18/18  to   06/20/18  See note below for Objective Data and Assessment of Progress/Goals.      Encounter Date: 06/20/2018  PT End of Session - 06/20/18 1149    Visit Number  10    Number of Visits  12    Date for PT Re-Evaluation  07/08/18    Authorization Type  self pay    PT Start Time  1145    PT Stop Time  1230    PT Time Calculation (min)  45 min    Activity Tolerance  Patient tolerated treatment well    Behavior During Therapy  [facility] for tasks assessed/performed       Past Medical History:  Diagnosis Date  . Anxiety   . Arthritis   . Asthma   . Bursitis   . Depression   . GERD (gastroesophageal reflux disease)   . Hypertension   . Muscle spasms of both lower extremities   . PTSD (post-traumatic stress disorder)   . Sciatica   . Uterine fibroid     Past Surgical History:  Procedure Laterality Date  . colonoscopy      There were no vitals filed for this visit.  Subjective Assessment - 06/20/18 1155    Subjective  Back 6-7 today . LT shoulder sore from almost dropping something she was carrying over the week end.       Currently in Pain?  Yes    Pain Score  7     Pain Location  Back    Pain Orientation  Left;Lower    Pain Descriptors / Indicators  Throbbing    Pain Type  Chronic pain    Pain Onset  More than a month ago    Pain Frequency  Constant    Aggravating Factors   activity on feet    Pain Relieving Factors  rest , sit     Pain Score  4    Pain Location  Knee    Pain Orientation  Left    Pain Descriptors / Indicators  Aching    Pain Type  Chronic pain                       [facility] Adult PT  Treatment/Exercise - 06/20/18 0001      Lumbar Exercises: Stretches   Lower Trunk Rotation Limitations  RT/LtT x 15      Lumbar Exercises: Supine   Ab Set  20 reps;5 seconds    Pelvic Tilt  20 seconds    Bent Knee Raise  20 reps    Bent Knee Raise Limitations  RT/LT    Bridge  20 reps    Bridge Limitations  legs on ball    Straight Leg Raise  15 reps    Straight Leg Raises Limitations  RT/LT     Other Supine Lumbar Exercises  part sit up 2x 10 slide hands on physioball    Other Supine Lumbar Exercises  ball squeeze with ab contract  and clam green band x 25 with glut squeeze x 20.      Moist Heat Therapy   Number Minutes Moist Heat  --   during session  Moist Heat Location  Lumbar Spine             PT Education - 06/20/18 1220    Education Details  ice LT shoulder 2-3x/day x 2 more days then try heat and use which ever makes shoulder feel better    Person(s) Educated  Patient    Methods  Explanation    Comprehension  Verbalized understanding       PT Short Term Goals - 06/02/18 1222      PT SHORT TERM GOAL #1   Title  She will be indpendent with intinal HEP     Status  Achieved        PT Long Term Goals - 06/20/18 1222      PT LONG TERM GOAL #1   Title  She will be independent with all HEp issued    Status  On-going      PT LONG TERM GOAL #2   Title  She will report pain decreased 30-40% with walking and home tasks.     Baseline  improved but variable    Status  On-going      PT LONG TERM GOAL #3   Title  She will demo improved posture with flexed posture decreased     Baseline  Still some flexed but can correct     Status  Achieved      PT LONG TERM GOAL #4   Title  Lt knee pain decr 40% or more and able to flex to 120 degrees to decr pain with walking and trasitions    Baseline  100    Status  On-going      PT LONG TERM GOAL #5   Title  FOTO will decrease from 64% limted to 50% limited demo perceived improvement in function    Baseline   increased at last session     Status  On-going            Plan - 06/20/18 1204    Clinical Impression Statement  Held on arm exercises due to sore LT shoulder. She did all exercise well and  without complaint.     PT Treatment/Interventions  Passive range of motion;Manual techniques;Patient/family education;Therapeutic exercise;Iontophoresis 4mg /ml Dexamethasone;Moist Heat;Electrical Stimulation    PT Next Visit Plan  As she feels PT is helping will finish POC but do 1x/week for 2 weeks to do this to progress HEP  add clams and pillow squeezes for home     PT Home Exercise Plan  LTR , abdominal and glut sets, bent knee raises, hip flexor stretch, standing rows and extension     Consulted and Agree with Plan of Care  Patient       Patient will benefit from skilled therapeutic intervention in order to improve the following deficits and impairments:  Obesity, Pain, Postural dysfunction, Impaired flexibility, Decreased strength, Decreased activity tolerance, Difficulty walking, Decreased range of motion, Increased muscle spasms, Decreased scar mobility  Visit Diagnosis: Chronic bilateral low back pain, unspecified whether sciatica present  Muscle weakness (generalized)  Muscle spasm of back  Abnormal posture     Problem List Patient Active Problem List   Diagnosis Date Noted  . Uterine fibroid 06/09/2017  . 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

## 2018-06-28 ENCOUNTER — Encounter: Payer: Self-pay | Admitting: Physical Therapy

## 2018-06-28 ENCOUNTER — Ambulatory Visit: Payer: Medicaid Other | Admitting: Physical Therapy

## 2018-06-28 ENCOUNTER — Other Ambulatory Visit: Payer: Self-pay

## 2018-06-28 ENCOUNTER — Ambulatory Visit: Payer: Medicaid Other

## 2018-06-28 DIAGNOSIS — M545 Low back pain, unspecified: Secondary | ICD-10-CM

## 2018-06-28 DIAGNOSIS — M62830 Muscle spasm of back: Secondary | ICD-10-CM

## 2018-06-28 DIAGNOSIS — G8929 Other chronic pain: Secondary | ICD-10-CM

## 2018-06-28 DIAGNOSIS — M6281 Muscle weakness (generalized): Secondary | ICD-10-CM

## 2018-06-28 DIAGNOSIS — R293 Abnormal posture: Secondary | ICD-10-CM

## 2018-06-28 NOTE — Therapy (Addendum)
[place], [place], [address] Phone: [phone]   Fax:  [phone]  Physical Therapy Treatment/ discharge   Patient Details  Name: Margaret Owens MRN: [id number] Date of Birth: 1971-02-04 Referring Provider (PT): Margaret Blackbird, MD   Encounter Date: 06/28/2018  PT End of Session - 06/28/18 1421    Visit Number  11    Number of Visits  12    Date for PT Re-Evaluation  07/08/18    Authorization Type  self pay    PT Start Time  1419    PT Stop Time  1509    PT Time Calculation (min)  50 min    Activity Tolerance  Patient tolerated treatment well    Behavior During Therapy  [facility] for tasks assessed/performed       Past Medical History:  Diagnosis Date  . Anxiety   . Arthritis   . Asthma   . Bursitis   . Depression   . GERD (gastroesophageal reflux disease)   . Hypertension   . Muscle spasms of both lower extremities   . PTSD (post-traumatic stress disorder)   . Sciatica   . Uterine fibroid     Past Surgical History:  Procedure Laterality Date  . colonoscopy      There were no vitals filed for this visit.  Subjective Assessment - 06/28/18 1420    Subjective  Lt shoulder bothering her today.  Back is better than it has been.     Currently in Pain?  Yes    Pain Score  7     Pain Location  Back    Pain Orientation  Left;Lower    Pain Descriptors / Indicators  Aching    Pain Type  Chronic pain    Pain Onset  More than a month ago    Pain Frequency  Constant    Aggravating Factors   standing    Pain Relieving Factors  rest, sit, heat     Multiple Pain Sites  Yes    Pain Score  9    Pain Location  Shoulder    Pain Orientation  Left    Pain Descriptors / Indicators  Aching    Pain Type  Chronic pain    Pain Onset  More than a month ago    Pain Frequency  Intermittent    Aggravating Factors   using it     Pain Relieving Factors  rest, ice           [facility] Adult PT Treatment/Exercise -  06/28/18 0001      Lumbar Exercises: Stretches   Active Hamstring Stretch Limitations  knee to chest with knee ext, flex limited on LLE     Lower Trunk Rotation  10 seconds    Lower Trunk Rotation Limitations  x 10       Lumbar Exercises: Aerobic   Nustep  LE only 7 min L5       Lumbar Exercises: Supine   AB Set Limitations  15 reps with ball     Glut Set  15 reps    Glut Set Limitations  with ball     Bridge with Ball Squeeze Limitations  glute set see above     Straight Leg Raise  10 reps    Straight Leg Raises Limitations  2 sets     Large Ball Abdominal Isometric Limitations  x 10 press in supine     Other Supine Lumbar Exercises  knee extension with ball squeeze    x 10      Lumbar Exercises: Sidelying   Clam  Both;15 reps    Hip Abduction  Both;15 reps      Moist Heat Therapy   Number Minutes Moist Heat  5 Minutes   pt request    Moist Heat Location  Lumbar Spine             PT Education - 06/28/18 1506    Education Details  HEP, more advanced for post PT     Person(s) Educated  Patient    Methods  Explanation;Handout    Comprehension  Verbalized understanding       PT Short Term Goals - 06/02/18 1222      PT SHORT TERM GOAL #1   Title  She will be indpendent with intinal HEP     Status  Achieved        PT Long Term Goals - 06/28/18 1446      PT LONG TERM GOAL #1   Title  She will be independent with all HEp issued      PT LONG TERM GOAL #2   Title  She will report pain decreased 30-40% with walking and home tasks.     Baseline  improved but variable    Status  On-going      PT LONG TERM GOAL #3   Title  She will demo improved posture with flexed posture decreased     Status  Achieved      PT LONG TERM GOAL #4   Title  Lt knee pain decr 40% or more and able to flex to 120 degrees to decr pain with walking and trasitions    Status  Unable to assess      PT LONG TERM GOAL #5   Title  FOTO will decrease from 64% limted to 50% limited demo  perceived improvement in function    Status  On-going            Plan - 06/28/18 1423    Clinical Impression Statement  Patient with overall less pain in back today.  Prepped her for discharge and began to discuss options for continued exercise.  She did have some difficulty extending her L knee during exercises, no complaints of pain in back with exercises.      PT Treatment/Interventions  Passive range of motion;Manual techniques;Patient/family education;Therapeutic exercise;Iontophoresis 37m/ml Dexamethasone;Moist Heat;Electrical Stimulation    PT Next Visit Plan  DC , FOTO and final HEP review     PT Home Exercise Plan  LTR , abdominal and glut sets, bent knee raises, hip flexor stretch, standing rows and extension     Consulted and Agree with Plan of Care  Patient       Patient will benefit from skilled therapeutic intervention in order to improve the following deficits and impairments:  Obesity, Pain, Postural dysfunction, Impaired flexibility, Decreased strength, Decreased activity tolerance, Difficulty walking, Decreased range of motion, Increased muscle spasms, Decreased scar mobility  Visit Diagnosis: Chronic bilateral low back pain, unspecified whether sciatica present  Muscle weakness (generalized)  Muscle spasm of back  Abnormal posture     Problem List Patient Active Problem List   Diagnosis Date Noted  . Uterine fibroid 06/09/2017  . 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 She continues to do her HEP  Remaining deficits: She is doing some better as her acti vity level is significantly reduced with staying at home during Covid 19 pandemic   Education / Equipment: HEP Plan: Patient agrees to discharge.  Patient goals were partially met. Patient is being discharged due to                                                     ?????  Doing better and not able to get out at this time   Pearson Forster, PT   08/02/18

## 2018-07-01 MED FILL — MELOXICAM 7.5 MG TABLET: 7.5 | 30 days supply | Qty: 30 | Fill #0

## 2018-07-01 MED FILL — SYMBICORT 160-4.5 MCG INH: 160-4.5 | 30 days supply | Qty: 10 | Fill #0

## 2018-07-01 MED FILL — GABAPENTIN 300 MG CAPSULE: 300 | 30 days supply | Qty: 120 | Fill #0

## 2018-07-04 ENCOUNTER — Ambulatory Visit: Payer: Medicaid Other | Admitting: Physical Therapy

## 2018-07-04 ENCOUNTER — Ambulatory Visit: Payer: Medicaid Other | Admitting: Pulmonary Disease

## 2018-07-04 ENCOUNTER — Encounter: Payer: Self-pay | Admitting: Family Medicine

## 2018-07-04 ENCOUNTER — Ambulatory Visit: Payer: Medicaid Other | Attending: Pulmonary Disease | Admitting: Family Medicine

## 2018-07-04 ENCOUNTER — Other Ambulatory Visit: Payer: Self-pay

## 2018-07-04 VITALS — BP 118/71 | HR 89 | Temp 98.3°F | Resp 18 | Ht 69.0 in | Wt 283.0 lb

## 2018-07-04 DIAGNOSIS — M1712 Unilateral primary osteoarthritis, left knee: Secondary | ICD-10-CM | POA: Insufficient documentation

## 2018-07-04 DIAGNOSIS — Z791 Long term (current) use of non-steroidal anti-inflammatories (NSAID): Secondary | ICD-10-CM | POA: Insufficient documentation

## 2018-07-04 DIAGNOSIS — M62838 Other muscle spasm: Secondary | ICD-10-CM | POA: Diagnosis not present

## 2018-07-04 DIAGNOSIS — F4310 Post-traumatic stress disorder, unspecified: Secondary | ICD-10-CM | POA: Diagnosis not present

## 2018-07-04 DIAGNOSIS — G8929 Other chronic pain: Secondary | ICD-10-CM

## 2018-07-04 DIAGNOSIS — M545 Low back pain: Secondary | ICD-10-CM | POA: Diagnosis not present

## 2018-07-04 DIAGNOSIS — R1030 Lower abdominal pain, unspecified: Secondary | ICD-10-CM

## 2018-07-04 DIAGNOSIS — Z79899 Other long term (current) drug therapy: Secondary | ICD-10-CM | POA: Diagnosis not present

## 2018-07-04 DIAGNOSIS — J45909 Unspecified asthma, uncomplicated: Secondary | ICD-10-CM | POA: Diagnosis not present

## 2018-07-04 DIAGNOSIS — F329 Major depressive disorder, single episode, unspecified: Secondary | ICD-10-CM | POA: Diagnosis not present

## 2018-07-04 DIAGNOSIS — K219 Gastro-esophageal reflux disease without esophagitis: Secondary | ICD-10-CM | POA: Diagnosis not present

## 2018-07-04 DIAGNOSIS — I10 Essential (primary) hypertension: Secondary | ICD-10-CM | POA: Diagnosis not present

## 2018-07-04 DIAGNOSIS — M25562 Pain in left knee: Secondary | ICD-10-CM

## 2018-07-04 DIAGNOSIS — D251 Intramural leiomyoma of uterus: Secondary | ICD-10-CM | POA: Diagnosis not present

## 2018-07-04 DIAGNOSIS — D250 Submucous leiomyoma of uterus: Secondary | ICD-10-CM

## 2018-07-04 LAB — POCT URINALYSIS DIP (CLINITEK)
Bilirubin, UA: NEGATIVE
Glucose, UA: NEGATIVE mg/dL
Ketones, POC UA: NEGATIVE mg/dL
Leukocytes, UA: NEGATIVE
Nitrite, UA: NEGATIVE
POC PROTEIN,UA: 30 — AB
Spec Grav, UA: 1.02
Urobilinogen, UA: 1 U/dL
pH, UA: 5.5

## 2018-07-04 MED ORDER — DICLOFENAC SODIUM 1 % TD GEL: TRANSDERMAL | 6 refills | Status: DC

## 2018-07-04 MED FILL — DICLOFENAC SODIUM 1% GEL: 1 | 6 days supply | Qty: 100 | Fill #0

## 2018-07-04 NOTE — Patient Instructions (Signed)
Arthritis  Arthritis means joint pain. It can also mean joint disease. A joint is a place where bones come together. People who have arthritis may have:  · Red joints.  · Swollen joints.  · Stiff joints.  · Warm joints.  · A fever.  · A feeling of being sick.  Follow these instructions at home:  Pay attention to any changes in your symptoms. Take these actions to help with your pain and swelling.  Medicines  · Take over-the-counter and prescription medicines only as told by your doctor.  · Do not take aspirin for pain if your doctor says that you may have gout.  Activity  · Rest your joint if your doctor tells you to.  · Avoid activities that make the pain worse.  · Exercise your joint regularly as told by your doctor. Try doing exercises like:  ? Swimming.  ? Water aerobics.  ? Biking.  ? Walking.  Joint Care    · If your joint is swollen, keep it raised (elevated) if told by your doctor.  · If your joint feels stiff in the morning, try taking a warm shower.  · If you have diabetes, do not apply heat without asking your doctor.  · If told, apply heat to the joint:  ? Put a towel between the joint and the hot pack or heating pad.  ? Leave the heat on the area for 20-30 minutes.  · If told, apply ice to the joint:  ? Put ice in a plastic bag.  ? Place a towel between your skin and the bag.  ? Leave the ice on for 20 minutes, 2-3 times per day.  · Keep all follow-up visits as told by your doctor.  Contact a doctor if:  · The pain gets worse.  · You have a fever.  Get help right away if:  · You have very bad pain in your joint.  · You have swelling in your joint.  · Your joint is red.  · Many joints become painful and swollen.  · You have very bad back pain.  · Your leg is very weak.  · You cannot control your pee (urine) or poop (stool).  This information is not intended to replace advice given to you by your health care provider. Make sure you discuss any questions you have with your health care provider.  Document  Released: 06/24/2009 Document Revised: 09/05/2015 Document Reviewed: 06/25/2014  [employer] Interactive Patient Education © 2019 [employer]

## 2018-07-04 NOTE — Progress Notes (Signed)
Established Patient Office Visit  Subjective:  Patient ID: Margaret Owens, female    DOB: July 19, 1970  Age: 48 y.o. MRN: [id number]  CC: lower abdominal pain and  Left knee pain with swelling since ED visit on 04/26/2018 HPI Margaret Owens presents as a walk-in to the clinic secondary to the complaint of continued pain and swelling in the left knee since she was seen in the emergency department on 04/26/2018.  Patient also with complaint of lower abdominal pain which she believes started after her last menses and she reports onset of menses today as well.  On review of chart, patient was seen in the emergency department on 04/26/2018 due to acute left knee pain and swelling.  Patient did not have any injury prior to the ED visit.  Patient had x-ray of the left knee which noted mild degenerative changes most prominent in the patellofemoral compartment and a small suprapatellar knee joint effusion.  Patient was advised to follow-up with her orthopedic doctor as her knee pain was also thought to be related to her known osteoarthritis of the knee.  Patient also with sed rate of 31 with normal value of 0-22 and C-reactive protein of 1.2 with normal value being less than 1.0.  Patient also with known uterine fibroids as she has had pelvic ultrasound 06/08/2017 showing 2 small uterine leiomyomata which extend submucosal. Patient has had follow-up with her OB/GYN Dr. Arlina Robes for irregular periods, fibroids and removal of uterine polyp in April of 2019.   Patient reports that she has had recurrent knee pain in the left knee as well as feeling as if her left knee is swollen since she went to the emergency department in January of this year.  Patient reports that she is only taking Tylenol for knee pain.  Patient states that she was unable to follow-up with orthopedics regarding her knee pain secondary to issues with her insurance/financial issues.  Patient has been attending physical therapy for low back pain  and states that her last physical therapy appointment was moved to April due to current COVID virus concerns by PT. patient states that her pain is throbbing in nature and is currently an 8 on a 0-to-10 scale but when she is walking/active, her pain increases to a 10.  She also reports crampy, dull, constant lower abdominal pain which she states started during her last menstrual period and then has continued until she restarted her menses earlier today.  Patient denies any urinary frequency or dysuria.  Patient sometimes feels as if the lower abdominal pain radiates to her mid to lower back.  Lower abdominal pain ranges from a 2 to a 6 on a 0-to-10 scale.  Nothing so far really makes the pain better or worse that she is aware of.  Past Medical History:  Diagnosis Date  . Anxiety   . Arthritis   . Asthma   . Bursitis   . Depression   . GERD (gastroesophageal reflux disease)   . Hypertension   . Muscle spasms of both lower extremities   . PTSD (post-traumatic stress disorder)   . Sciatica   . Uterine fibroid     Past Surgical History:  Procedure Laterality Date  . colonoscopy      Family History  Problem Relation Age of Onset  . Osteoarthritis Mother   . Breast cancer Maternal Grandmother        spread to lungs     Social History   Tobacco Use  .  Smoking status: Never Smoker  . Smokeless tobacco: Never Used  Substance Use Topics  . Alcohol use: Yes    Comment: 1/week  . Drug use: No    Outpatient Medications Prior to Visit  Medication Sig Dispense Refill  . albuterol (PROVENTIL HFA;VENTOLIN HFA) 108 (90 Base) MCG/ACT inhaler Inhale 2 puffs into the lungs every 6 (six) hours as needed for wheezing (wheezing). 1 Inhaler 6  . azithromycin (ZITHROMAX) 250 MG tablet Take 2 pills on the first day then 1 pill daily x4 days 6 tablet 0  . budesonide-formoterol (SYMBICORT) 160-4.5 MCG/ACT inhaler 2 puffs twice daily to control asthma 1 Inhaler 6  . cetirizine (ZYRTEC) 10 MG tablet  Take 1 tablet (10 mg total) by mouth daily. 30 tablet 6  . citalopram (CELEXA) 10 MG tablet Take 1 tablet (10 mg total) by mouth daily. 30 tablet 3  . clotrimazole (LOTRIMIN) 1 % cream Apply to affected area 2 times daily 30 g 0  . cyclobenzaprine (FLEXERIL) 10 MG tablet Take 1 tablet (10 mg total) by mouth 3 (three) times daily as needed for muscle spasms. 8 tablet 0  . desonide (DESOWEN) 0.05 % cream Apply 1 application topically 2 (two) times daily.    . diclofenac sodium (VOLTAREN) 1 % GEL Apply 2 g topically 4 (four) times daily as needed (apply to leg). 4 Tube 6  . fluticasone (FLONASE) 50 MCG/ACT nasal spray Place 2 sprays into both nostrils daily. 1 g 3  . gabapentin (NEURONTIN) 300 MG capsule Take 2 pills twice per day and 2 pills at bedtime for nerve pain 240 capsule 4  . meloxicam (MOBIC) 15 MG tablet Take 1 tablet (15 mg total) by mouth daily. 30 tablet 0  . methocarbamol (ROBAXIN) 500 MG tablet Take 1 tablet (500 mg total) by mouth at bedtime as needed for muscle spasms. 30 tablet 0  . pantoprazole (PROTONIX) 40 MG tablet Take 1 tablet (40 mg total) by mouth 2 (two) times daily. 60 tablet 6  . phentermine 37.5 MG capsule Take 37.5 mg by mouth every morning.    . polyethylene glycol (MIRALAX) packet Take 17 g by mouth daily as needed. 14 each 6  . predniSONE (DELTASONE) 20 MG tablet 2 pills once daily x5 days.  Take after eating 10 tablet 0  . tiZANidine (ZANAFLEX) 4 MG tablet Take 1 tablet (4 mg total) by mouth at bedtime. As needed for muscle spasm 30 tablet 4  . traMADol (ULTRAM) 50 MG tablet Take 1 tablet (50 mg total) by mouth every 6 (six) hours as needed. 15 tablet 0  . traZODone (DESYREL) 50 MG tablet Take 50 mg by mouth at bedtime as needed for sleep (sleep).     . Vitamin D, Ergocalciferol, (DRISDOL) 50000 UNITS CAPS capsule Take 50,000 Units by mouth every 7 (seven) days.     No facility-administered medications prior to visit.     Allergies  Allergen Reactions  . Latex  Hives  . Penicillins Hives    Has patient had a PCN reaction causing immediate rash, facial/tongue/throat swelling, SOB or lightheadedness with hypotension: No Has patient had a PCN reaction causing severe rash involving mucus membranes or skin necrosis: No Has patient had a PCN reaction that required hospitalization No Has patient had a PCN reaction occurring within the last 10 years: No If all of the above answers are "NO", then may proceed with Cephalosporin use. Has patient had a PCN reaction causing immediate rash, facial/tongue/throat swelling, SOB or lightheadedness with  hypotension: No Has patient had a PCN reaction causing severe rash involving mucus membranes or skin necrosis: No Has patient had a PCN reaction that required hospitalization No Has patient had a PCN reaction occurring within the last 10 years: No If all of the above answers are "NO", then may proceed with Cephalosporin use.    ROS Review of Systems  Constitutional: Positive for fatigue. Negative for chills and fever.  HENT: Negative for congestion and trouble swallowing.   Respiratory: Negative for cough and shortness of breath.   Cardiovascular: Negative for chest pain, palpitations and leg swelling.  Gastrointestinal: Positive for abdominal pain. Negative for constipation, diarrhea and nausea.  Endocrine: Negative for polydipsia, polyphagia and polyuria.  Genitourinary: Negative for dysuria and frequency.  Musculoskeletal: Positive for arthralgias, back pain, gait problem, joint swelling and myalgias.  Neurological: Negative for dizziness and headaches.  Hematological: Negative for adenopathy. Does not bruise/bleed easily.      Objective:    Physical Exam  Constitutional: She is oriented to person, place, and time. She appears well-developed and well-nourished.  Morbidly obese female  Cardiovascular: Normal rate and regular rhythm.  Pulmonary/Chest: Effort normal and breath sounds normal. No respiratory  distress.  Abdominal: Soft. There is abdominal tenderness (mild lower abdominal discomfort with greatest area of discomfort suprapubically). There is no rebound and no guarding.  Musculoskeletal: Normal range of motion.        General: Tenderness present. No edema.     Comments: Tenderness at the joint lines of the left knee and patient with complaint of pain in the left lateral lower thigh/area proximal to the knee but no reproducible tenderness and no appreciable swelling/effusion on exam and knee joint is otherwise stable; presence of lumbosacral tenderness to palp; lumbar lordosis and abnormal slightly stooped and forward leaning gait. No use of assistive device at today's visit  Neurological: She is alert and oriented to person, place, and time.  Skin: Skin is warm and dry.  Psychiatric: She has a normal mood and affect. Her behavior is normal. Judgment and thought content normal.  Nursing note and vitals reviewed.   There were no vitals taken for this visit. Wt Readings from Last 3 Encounters:  04/18/18 277 lb (125.6 kg)  01/14/18 259 lb (117.5 kg)  12/17/17 257 lb 9.6 oz (116.8 kg)    No results found for: TSH Lab Results  Component Value Date   WBC 11.1 (H) 04/27/2018   HGB 11.7 (L) 04/27/2018   HCT 37.8 04/27/2018   MCV 87.5 04/27/2018   PLT 423 (H) 04/27/2018   Lab Results  Component Value Date   NA 137 04/27/2018   K 4.2 04/27/2018   CO2 23 04/27/2018   GLUCOSE 107 (H) 04/27/2018   BUN 9 04/27/2018   CREATININE 0.55 04/27/2018   BILITOT <0.2 12/17/2017   ALKPHOS 83 12/17/2017   AST 14 12/17/2017   ALT 12 12/17/2017   PROT 8.2 12/17/2017   ALBUMIN 4.6 12/17/2017   CALCIUM 9.0 04/27/2018   ANIONGAP 9 04/27/2018   No results found for: CHOL No results found for: HDL No results found for: LDLCALC No results found for: TRIG No results found for: CHOLHDL No results found for: HGBA1C    Assessment & Plan:  1. Lower abdominal pain And of chronic, dull, crampy  lower abdominal pain since her last menses.  Patient with some mild suprapubic tenderness to palpation.  Urinalysis was done which was unremarkable with the exception of hematuria but patient is follow-up on  the first day of her menses.  Urine will be sent for culture and patient will be notified if any further treatment is needed based on urine culture results.  Patient denies any dysuria or urinary frequency at this time. - POCT URINALYSIS DIP (CLINITEK) - Urine Culture  2. Primary osteoarthritis of left knee Patient with primary osteoarthritis of the left knee which was seen on x-ray done on in January of this year and patient with known bilateral knee osteoarthritis for which she has seen orthopedics in the past.  Patient reports that she was unable to see orthopedics secondary to insurance/financial issues.  Patient is provided with prescription for diclofenac gel to use as needed for bilateral knee pain.  Handout provided as part of after visit summary on arthritis and joint care. - diclofenac sodium (VOLTAREN) 1 % GEL; Apply 4 grams every 6 hours (or 4 times per day) to the knees if needed for pain  Dispense: 4 Tube; Refill: 6  3. Intramural and submucous leiomyoma of uterus Patient with known uterine fibroids and I discussed with the patient that this may be contributing to her lower abdominal pain, painful menses as well as some contribution to her chronic back pain.  Patient should follow-up with her GYN if she continues to have lower abdominal discomfort and irregular menses.  4. Chronic pain of left knee Patient with chronic left knee pain.  Patient is encouraged to use diclofenac gel which was refilled at today's visit.  Patient should seek orthopedic follow-up if her pain worsens - diclofenac sodium (VOLTAREN) 1 % GEL; Apply 4 grams every 6 hours (or 4 times per day) to the knees if needed for pain  Dispense: 4 Tube; Refill: 6  *Patient with abnormal PHQ 9 and GAD 7 screening.  Patient  reports that she is followed by [facility] mental health as well as family services.  Patient was made aware that the social worker from this office will likely be calling her as well (Education officer, museum was not in clinic during patient's visit but was made aware of the need to follow-up with patient regarding her abnormal depression and anxiety screenings).  Follow-up: Return if symptoms worsen or fail to improve, for schedule 3-4 month f/u of asthma.   Antony Blackbird, MD

## 2018-07-05 MED FILL — $VENTOLIN HFA 18G INHALER: 108 (90 BAS | 25 days supply | Qty: 18 | Fill #1

## 2018-07-06 ENCOUNTER — Other Ambulatory Visit: Payer: Self-pay | Admitting: Family Medicine

## 2018-07-06 DIAGNOSIS — N3000 Acute cystitis without hematuria: Secondary | ICD-10-CM

## 2018-07-06 LAB — URINE CULTURE

## 2018-07-06 MED ORDER — NITROFURANTOIN MONOHYD MACRO 100 MG PO CAPS: 100.0000 mg | ORAL_CAPSULE | Freq: Two times a day (BID) | ORAL | 0 refills | Status: AC

## 2018-07-06 NOTE — Progress Notes (Signed)
Patient ID: Margaret Owens, female   DOB: [birth year], 48 y.o.   MRN: [id number]   Patient with urine culture positive for E. Coli and patient will be contacted and RX for macrobid will called into patient's pharmacy for treatment of UTI. RX sent to [facility],

## 2018-07-07 ENCOUNTER — Telehealth: Payer: Self-pay | Admitting: *Deleted

## 2018-07-07 NOTE — Telephone Encounter (Signed)
-----   Message from Antony Blackbird, MD sent at 07/06/2018 10:58 AM EDT ----- Please noitfy patient that her urine culture is positive for the growth of bacteria and a RX will be sent into the pharmacy for an antibiotic, macrobid, for treatment

## 2018-07-07 NOTE — Telephone Encounter (Signed)
Patient verified DOB Patient is aware of growth being noted on the urine and Macrobid being sent to the [facility] on [address]. No further questions.

## 2018-07-15 ENCOUNTER — Telehealth: Payer: Self-pay | Admitting: Licensed Clinical Social Worker

## 2018-07-15 NOTE — Telephone Encounter (Signed)
LCSWA attempted to contact pt to follow up on behavioral health screens from previous appointment. LCSWA left message for a return call.

## 2018-08-02 ENCOUNTER — Telehealth: Payer: Self-pay | Admitting: Physical Therapy

## 2018-08-02 NOTE — Telephone Encounter (Signed)
Spoke to Margaret Owens and we agreed for discharge at this time and she will follow up with MD in future if needed for PT follow up.

## 2018-09-10 ENCOUNTER — Emergency Department (HOSPITAL_BASED_OUTPATIENT_CLINIC_OR_DEPARTMENT_OTHER)
Admission: EM | Admit: 2018-09-10 | Discharge: 2018-09-10 | Disposition: A | Discharge status: Home / Self Care | Payer: Medicaid Other | Attending: Emergency Medicine | Admitting: Emergency Medicine

## 2018-09-10 ENCOUNTER — Other Ambulatory Visit: Payer: Self-pay

## 2018-09-10 ENCOUNTER — Emergency Department (HOSPITAL_BASED_OUTPATIENT_CLINIC_OR_DEPARTMENT_OTHER): Payer: Medicaid Other

## 2018-09-10 ENCOUNTER — Encounter (HOSPITAL_BASED_OUTPATIENT_CLINIC_OR_DEPARTMENT_OTHER): Payer: Self-pay | Admitting: *Deleted

## 2018-09-10 DIAGNOSIS — R109 Unspecified abdominal pain: Secondary | ICD-10-CM | POA: Diagnosis present

## 2018-09-10 DIAGNOSIS — Z79899 Other long term (current) drug therapy: Secondary | ICD-10-CM | POA: Diagnosis not present

## 2018-09-10 DIAGNOSIS — J45909 Unspecified asthma, uncomplicated: Secondary | ICD-10-CM | POA: Diagnosis not present

## 2018-09-10 DIAGNOSIS — F329 Major depressive disorder, single episode, unspecified: Secondary | ICD-10-CM | POA: Insufficient documentation

## 2018-09-10 DIAGNOSIS — R112 Nausea with vomiting, unspecified: Secondary | ICD-10-CM | POA: Insufficient documentation

## 2018-09-10 DIAGNOSIS — M1712 Unilateral primary osteoarthritis, left knee: Secondary | ICD-10-CM | POA: Diagnosis not present

## 2018-09-10 DIAGNOSIS — G8929 Other chronic pain: Secondary | ICD-10-CM

## 2018-09-10 DIAGNOSIS — M1710 Unilateral primary osteoarthritis, unspecified knee: Secondary | ICD-10-CM

## 2018-09-10 DIAGNOSIS — I10 Essential (primary) hypertension: Secondary | ICD-10-CM | POA: Insufficient documentation

## 2018-09-10 DIAGNOSIS — F419 Anxiety disorder, unspecified: Secondary | ICD-10-CM | POA: Insufficient documentation

## 2018-09-10 DIAGNOSIS — M25562 Pain in left knee: Secondary | ICD-10-CM

## 2018-09-10 DIAGNOSIS — Z91040 Latex allergy status: Secondary | ICD-10-CM | POA: Diagnosis not present

## 2018-09-10 HISTORY — DX: Unspecified convulsions: R56.9

## 2018-09-10 LAB — URINALYSIS, ROUTINE W REFLEX MICROSCOPIC
Bilirubin Urine: NEGATIVE
Glucose, UA: NEGATIVE mg/dL
Ketones, ur: NEGATIVE mg/dL
Leukocytes,Ua: NEGATIVE
Nitrite: NEGATIVE
Protein, ur: NEGATIVE mg/dL
Specific Gravity, Urine: 1.01 (ref 1.005–1.030)
pH: 6.5 (ref 5.0–8.0)

## 2018-09-10 LAB — CBC WITH DIFFERENTIAL/PLATELET
Abs Immature Granulocytes: 0.02 10*3/uL (ref 0.00–0.07)
Basophils Absolute: 0 10*3/uL (ref 0.0–0.1)
Basophils Relative: 0 %
Eosinophils Absolute: 0.1 10*3/uL (ref 0.0–0.5)
Eosinophils Relative: 1 %
HCT: 35.6 % — ABNORMAL LOW (ref 36.0–46.0)
Hemoglobin: 11 g/dL — ABNORMAL LOW (ref 12.0–15.0)
Immature Granulocytes: 0 %
Lymphocytes Relative: 29 %
Lymphs Abs: 2.6 10*3/uL (ref 0.7–4.0)
MCH: 25.5 pg — ABNORMAL LOW (ref 26.0–34.0)
MCHC: 30.9 g/dL (ref 30.0–36.0)
MCV: 82.6 fL (ref 80.0–100.0)
Monocytes Absolute: 0.6 10*3/uL (ref 0.1–1.0)
Monocytes Relative: 6 %
Neutro Abs: 5.7 10*3/uL (ref 1.7–7.7)
Neutrophils Relative %: 64 %
Platelets: 443 10*3/uL — ABNORMAL HIGH (ref 150–400)
RBC: 4.31 MIL/uL (ref 3.87–5.11)
RDW: 14.1 % (ref 11.5–15.5)
WBC: 9 10*3/uL (ref 4.0–10.5)
nRBC: 0 % (ref 0.0–0.2)

## 2018-09-10 LAB — COMPREHENSIVE METABOLIC PANEL
ALT: 12 U/L (ref 0–44)
AST: 13 U/L — ABNORMAL LOW (ref 15–41)
Albumin: 3.9 g/dL (ref 3.5–5.0)
Alkaline Phosphatase: 72 U/L (ref 38–126)
Anion gap: 8 (ref 5–15)
BUN: 8 mg/dL (ref 6–20)
CO2: 24 mmol/L (ref 22–32)
Calcium: 8.8 mg/dL — ABNORMAL LOW (ref 8.9–10.3)
Chloride: 104 mmol/L (ref 98–111)
Creatinine, Ser: 0.69 mg/dL (ref 0.44–1.00)
GFR calc Af Amer: >60 mL/min (ref >60)
GFR calc non Af Amer: >60 mL/min (ref >60)
Glucose, Bld: 106 mg/dL — ABNORMAL HIGH (ref 70–99)
Potassium: 3.3 mmol/L — ABNORMAL LOW (ref 3.5–5.1)
Sodium: 136 mmol/L (ref 135–145)
Total Bilirubin: 0.3 mg/dL (ref 0.3–1.2)
Total Protein: 8.2 g/dL — ABNORMAL HIGH (ref 6.5–8.1)

## 2018-09-10 LAB — URINALYSIS, MICROSCOPIC (REFLEX): WBC, UA: NONE SEEN WBC/hpf (ref 0–5)

## 2018-09-10 LAB — PREGNANCY, URINE: Preg Test, Ur: NEGATIVE

## 2018-09-10 LAB — LIPASE, BLOOD: Lipase: 30 U/L (ref 11–51)

## 2018-09-10 MED ORDER — ONDANSETRON 4 MG PO TBDP: ORAL_TABLET | ORAL | 0 refills | Status: DC

## 2018-09-10 MED ORDER — DICLOFENAC SODIUM 1 % TD GEL: TRANSDERMAL | 0 refills | Status: AC

## 2018-09-10 MED ORDER — ONDANSETRON HCL 4 MG/2ML IJ SOLN
4.0000 mg | Freq: Once | INTRAMUSCULAR | Status: AC
  Administered 2018-09-10: 4 mg via INTRAVENOUS
  Filled 2018-09-10: qty 2

## 2018-09-10 MED ORDER — MORPHINE SULFATE (PF) 4 MG/ML IV SOLN
4.0000 mg | Freq: Once | INTRAVENOUS | Status: AC
  Administered 2018-09-10: 20:00:00 4 mg via INTRAVENOUS
  Filled 2018-09-10: qty 1

## 2018-09-10 MED ORDER — TRAMADOL HCL 50 MG PO TABS
50.0000 mg | ORAL_TABLET | Freq: Once | ORAL | Status: AC
  Administered 2018-09-10: 50 mg via ORAL
  Filled 2018-09-10: qty 1

## 2018-09-10 MED ORDER — IOHEXOL 300 MG/ML  SOLN
100.0000 mL | Freq: Once | INTRAMUSCULAR | Status: AC | PRN
  Administered 2018-09-10: 100 mL via INTRAVENOUS

## 2018-09-10 MED ORDER — SODIUM CHLORIDE 0.9 % IV BOLUS
1000.0000 mL | Freq: Once | INTRAVENOUS | Status: AC
  Administered 2018-09-10: 1000 mL via INTRAVENOUS

## 2018-09-10 NOTE — Discharge Instructions (Signed)
Take zofran for nausea  Stay hydrated   Use voltaren gel to the knee for comfort.   You have arthritis of your knee. Follow up with your doctor. Consider referral to orthopedic doctor   Return to ER if you have worse abdominal pain, vomiting, fever, knee pain

## 2018-09-10 NOTE — ED Triage Notes (Signed)
Pt reports decreased appetite, stomach pain and bloating. Nausea, back pain, and left knee swelling and pain over the last 4 weeks

## 2018-09-10 NOTE — ED Provider Notes (Signed)
[facility] EMERGENCY DEPARTMENT Provider Note   CSN: [id number] Arrival date & time: 09/10/18  1833    History   Chief Complaint Chief Complaint  Patient presents with   Back Pain   Abdominal Pain    HPI Margaret Owens is a 48 y.o. female here presenting with abdominal pain, back pain, left knee pain.  Patient has intermittent abdominal pain and left knee pain for the last several months.  She was actually seen in the ED previously and was diagnosed with arthritis of the left knee as well as fibroids of the uterus.  Patient states that her pain was under control for the last several months but for the last week or so she has been having some nausea as well as diffuse lower abdominal pain as well as flank pain.  She denies any urinary symptoms.  Patient states that she is irregular in her menses and did not have a period last month.  Patient also has worsening left knee pain for the last month or so but denies any trauma or injury.  Denies any fevers or chills or shortness of breath or travel.      The history is provided by the patient.    Past Medical History:  Diagnosis Date   Anxiety    Arthritis    Asthma    Bursitis    Depression    GERD (gastroesophageal reflux disease)    Hypertension    Muscle spasms of both lower extremities    PTSD (post-traumatic stress disorder)    Sciatica    Seizures (HCC)    Uterine fibroid     Patient Active Problem List   Diagnosis Date Noted   Primary osteoarthritis of left knee 07/04/2018   Uterine fibroid 06/09/2017   Endometrial stromal nodule 06/09/2017    Past Surgical History:  Procedure Laterality Date   colonoscopy       OB History    Gravida  3   Para  3   Term  3   Preterm      AB      Living  3     SAB  0   TAB  0   Ectopic  0   Multiple  0   Live Births  3            Home Medications    Prior to Admission medications   Medication Sig Start Date End Date  Taking? Authorizing Provider  albuterol (PROVENTIL HFA;VENTOLIN HFA) 108 (90 Base) MCG/ACT inhaler Inhale 2 puffs into the lungs every 6 (six) hours as needed for wheezing (wheezing). 04/18/18   Fulp, Ander Gaster, MD  budesonide-formoterol (SYMBICORT) 160-4.5 MCG/ACT inhaler 2 puffs twice daily to control asthma 04/18/18   Fulp, Cammie, MD  cetirizine (ZYRTEC) 10 MG tablet Take 1 tablet (10 mg total) by mouth daily. 12/17/17   Fulp, Cammie, MD  citalopram (CELEXA) 10 MG tablet Take 1 tablet (10 mg total) by mouth daily. 12/17/17   Fulp, Cammie, MD  clotrimazole (LOTRIMIN) 1 % cream Apply to affected area 2 times daily 10/09/17   Charlann Lange, PA-C  cyclobenzaprine (FLEXERIL) 10 MG tablet Take 1 tablet (10 mg total) by mouth 3 (three) times daily as needed for muscle spasms. 04/28/18   Little, Wenda Overland, MD  desonide (DESOWEN) 0.05 % cream Apply 1 application topically 2 (two) times daily.    [provider]  diclofenac sodium (VOLTAREN) 1 % GEL Apply 4 grams every 6 hours (or 4  times per day) to the knees if needed for pain 07/04/18   Fulp, Cammie, MD  fluticasone (FLONASE) 50 MCG/ACT nasal spray Place 2 sprays into both nostrils daily. 12/17/17   Fulp, Cammie, MD  gabapentin (NEURONTIN) 300 MG capsule Take 2 pills twice per day and 2 pills at bedtime for nerve pain 04/18/18   Fulp, Cammie, MD  meloxicam (MOBIC) 15 MG tablet Take 1 tablet (15 mg total) by mouth daily. 04/27/18   Orpah Greek, MD  methocarbamol (ROBAXIN) 500 MG tablet Take 1 tablet (500 mg total) by mouth at bedtime as needed for muscle spasms. 12/01/17   Jorje Guild, NP  pantoprazole (PROTONIX) 40 MG tablet Take 1 tablet (40 mg total) by mouth 2 (two) times daily. 12/17/17   Fulp, Cammie, MD  phentermine 37.5 MG capsule Take 37.5 mg by mouth every morning.    [provider]  polyethylene glycol (MIRALAX) packet Take 17 g by mouth daily as needed. 12/17/17   Fulp, Cammie, MD  tiZANidine (ZANAFLEX) 4 MG tablet Take 1 tablet  (4 mg total) by mouth at bedtime. As needed for muscle spasm 04/18/18   Fulp, Cammie, MD  traMADol (ULTRAM) 50 MG tablet Take 1 tablet (50 mg total) by mouth every 6 (six) hours as needed. 04/27/18   Orpah Greek, MD  traZODone (DESYREL) 50 MG tablet Take 50 mg by mouth at bedtime as needed for sleep (sleep).     [provider]  Vitamin D, Ergocalciferol, (DRISDOL) 50000 UNITS CAPS capsule Take 50,000 Units by mouth every 7 (seven) days.    [provider]    Family History Family History  Problem Relation Age of Onset   Osteoarthritis Mother    Breast cancer Maternal Grandmother        spread to lungs     Social History Social History   Tobacco Use   Smoking status: Never Smoker   Smokeless tobacco: Never Used  Substance Use Topics   Alcohol use: Yes    Comment: 1/week   Drug use: No     Allergies   Latex and Penicillins   Review of Systems Review of Systems  Gastrointestinal: Positive for abdominal pain.  Musculoskeletal: Positive for back pain.       L knee pain   All other systems reviewed and are negative.    Physical Exam Updated Vital Signs BP 133/72 (BP Location: Right Arm)    Pulse 70    Temp 98.4 F (36.9 C) (Oral)    Resp 18    Ht 5\' 9"  (1.753 m)    Wt 113.4 kg    LMP 08/30/2018    SpO2 100%    BMI 36.92 kg/m   Physical Exam Vitals signs and nursing note reviewed.  HENT:     Head: Normocephalic.     Mouth/Throat:     Mouth: Mucous membranes are moist.  Eyes:     Extraocular Movements: Extraocular movements intact.  Cardiovascular:     Rate and Rhythm: Normal rate and regular rhythm.  Pulmonary:     Effort: Pulmonary effort is normal.     Breath sounds: Normal breath sounds.  Abdominal:     General: Abdomen is flat.     Palpations: Abdomen is soft.     Comments: Mild diffuse lower abdominal tenderness, mild bilateral CVAT   Skin:    General: Skin is warm.     Capillary Refill: Capillary refill takes less than 2  seconds.  Comments: L knee with some swelling, able to range the knee, not red or hot or warm. No obvious cellulitis   Neurological:     General: No focal deficit present.     Mental Status: She is alert and oriented to person, place, and time.  Psychiatric:        Mood and Affect: Mood normal.        Behavior: Behavior normal.      ED Treatments / Results  Labs (all labs ordered are listed, but only abnormal results are displayed) Labs Reviewed  CBC WITH DIFFERENTIAL/PLATELET - Abnormal; Notable for the following components:      Result Value   Hemoglobin 11.0 (*)    HCT 35.6 (*)    MCH 25.5 (*)    Platelets 443 (*)    All other components within normal limits  COMPREHENSIVE METABOLIC PANEL - Abnormal; Notable for the following components:   Potassium 3.3 (*)    Glucose, Bld 106 (*)    Calcium 8.8 (*)    Total Protein 8.2 (*)    AST 13 (*)    All other components within normal limits  URINALYSIS, ROUTINE W REFLEX MICROSCOPIC - Abnormal; Notable for the following components:   Hgb urine dipstick TRACE (*)    All other components within normal limits  URINALYSIS, MICROSCOPIC (REFLEX) - Abnormal; Notable for the following components:   Bacteria, UA RARE (*)    All other components within normal limits  LIPASE, BLOOD  PREGNANCY, URINE    EKG None  Radiology Ct Abdomen Pelvis W Contrast  Result Date: 09/10/2018 CLINICAL DATA:  Decreased appetite with stomach and back pain for 5 weeks. Abdominal distension. EXAM: CT ABDOMEN AND PELVIS WITH CONTRAST TECHNIQUE: Multidetector CT imaging of the abdomen and pelvis was performed using the standard protocol following bolus administration of intravenous contrast. CONTRAST:  124mL OMNIPAQUE IOHEXOL 300 MG/ML  SOLN COMPARISON:  CT scan Aug 13, 2016 FINDINGS: Evaluation limited due to timing of contrast. The images obtained were delayed with poor opacification of parenchymal organs. Lower chest: Scarring or atelectasis in the medial  right lung base, stable. Lung bases otherwise normal. Hepatobiliary: No focal liver abnormality is seen. No gallstones, gallbladder wall thickening, or biliary dilatation. Pancreas: Unremarkable. No pancreatic ductal dilatation or surrounding inflammatory changes. Spleen: Normal in size without focal abnormality. Adrenals/Urinary Tract: Adrenal glands are unremarkable. Kidneys are normal, without renal calculi, focal lesion, or hydronephrosis. Bladder is unremarkable. Stomach/Bowel: Stomach is within normal limits. Appendix appears normal. No evidence of bowel wall thickening, distention, or inflammatory changes. Vascular/Lymphatic: No significant vascular findings are present. No enlarged abdominal or pelvic lymph nodes. Reproductive: Uterus and bilateral adnexa are unremarkable. Other: No abdominal wall hernia or abnormality. No abdominopelvic ascites. Musculoskeletal: Grade 1 anterolisthesis L4 versus L5. Lower lumbar facet degenerative changes are mild. Degenerative disc disease in the lower lumbar spine with vacuum disc phenomena. IMPRESSION: 1. Evaluation is limited due to timing of contrast. 2. No acute abnormalities to explain the patient's pain are identified. Electronically Signed   By: Dorise Bullion III M.D   On: 09/10/2018 21:25   Dg Knee Complete 4 Views Left  Result Date: 09/10/2018 CLINICAL DATA:  Chronic left knee stiffness, pain EXAM: LEFT KNEE - COMPLETE 4+ VIEW COMPARISON:  04/26/2018 FINDINGS: Joint space narrowing and spurring in the patellofemoral compartment. Moderate joint effusion. No acute bony abnormality. Specifically, no fracture, subluxation, or dislocation. 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 Moderate joint effusion. No acute bony abnormality.  Electronically Signed   By: Rolm Baptise M.D.   On: 09/10/2018 21:15    Procedures Procedures (including critical care time)  Medications Ordered in ED Medications  sodium chloride 0.9 % bolus  1,000 mL (1,000 mLs Intravenous New Bag/Given 09/10/18 1932)  ondansetron (ZOFRAN) injection 4 mg (4 mg Intravenous Given 09/10/18 1933)  morphine 4 MG/ML injection 4 mg (4 mg Intravenous Given 09/10/18 1933)  iohexol (OMNIPAQUE) 300 MG/ML solution 100 mL (100 mLs Intravenous Contrast Given 09/10/18 2029)     Initial Impression / Assessment and Plan / ED Course  I have reviewed the triage vital signs and the nursing notes.  Pertinent labs & imaging results that were available during my care of the patient were reviewed by me and considered in my medical decision making (see chart for details).        Shaunna E Arpin is a 48 y.o. female here with abdominal pain, L knee pain.  She does have history of fibroids and did not have menses last month.  Will check a pregnancy test and also a CT look for fibroids as well as pyelonephritis.  Patient also has left knee pain but that appears to be chronic and had previous arthritis on x-ray.  Will repeat knee xray.   9:31 PM Xray knee showed arthritis. Patient was on diclofenac before so will prescribe it again. Labs, UA, CT ab/pel otherwise unremarkable. Consider viral gastro as etiology of abdominal pain. Will dc home with zofran prn.    Final Clinical Impressions(s) / ED Diagnoses   Final diagnoses:  None    ED Discharge Orders    None       Drenda Freeze, MD 09/10/18 2132

## 2018-09-10 NOTE — ED Notes (Signed)
Taken to CT.

## 2018-09-21 MED FILL — PANTOPRAZOLE SOD DR 40 MG T: 40 | 30 days supply | Qty: 60 | Fill #1

## 2018-09-21 MED FILL — $VENTOLIN HFA 18G INHALER: 108 (90 BAS | 25 days supply | Qty: 18 | Fill #2

## 2018-09-21 MED FILL — SYMBICORT 160-4.5 MCG INH: 160-4.5 | 30 days supply | Qty: 10 | Fill #1

## 2018-10-05 ENCOUNTER — Encounter: Payer: Self-pay | Admitting: Family Medicine

## 2018-10-05 ENCOUNTER — Other Ambulatory Visit: Payer: Self-pay

## 2018-10-05 ENCOUNTER — Ambulatory Visit: Payer: Self-pay | Attending: Family Medicine | Admitting: Family Medicine

## 2018-10-05 DIAGNOSIS — M25562 Pain in left knee: Secondary | ICD-10-CM

## 2018-10-05 DIAGNOSIS — M5441 Lumbago with sciatica, right side: Secondary | ICD-10-CM

## 2018-10-05 DIAGNOSIS — M5442 Lumbago with sciatica, left side: Secondary | ICD-10-CM

## 2018-10-05 DIAGNOSIS — M545 Low back pain, unspecified: Secondary | ICD-10-CM

## 2018-10-05 DIAGNOSIS — J309 Allergic rhinitis, unspecified: Secondary | ICD-10-CM

## 2018-10-05 DIAGNOSIS — K219 Gastro-esophageal reflux disease without esophagitis: Secondary | ICD-10-CM

## 2018-10-05 DIAGNOSIS — J4520 Mild intermittent asthma, uncomplicated: Secondary | ICD-10-CM

## 2018-10-05 DIAGNOSIS — M5440 Lumbago with sciatica, unspecified side: Secondary | ICD-10-CM

## 2018-10-05 DIAGNOSIS — G8929 Other chronic pain: Secondary | ICD-10-CM

## 2018-10-05 MED ORDER — GABAPENTIN 300 MG PO CAPS: ORAL_CAPSULE | ORAL | 4 refills | Status: DC

## 2018-10-05 MED ORDER — ALBUTEROL SULFATE HFA 108 (90 BASE) MCG/ACT IN AERS: 2.0000 | INHALATION_SPRAY | Freq: Four times a day (QID) | RESPIRATORY_TRACT | 5 refills | Status: AC | PRN

## 2018-10-05 MED ORDER — MELOXICAM 15 MG PO TABS: 15.0000 mg | ORAL_TABLET | Freq: Every day | ORAL | 3 refills | Status: AC

## 2018-10-05 MED ORDER — CETIRIZINE HCL 10 MG PO TABS: 10.0000 mg | ORAL_TABLET | Freq: Every day | ORAL | 5 refills | Status: AC

## 2018-10-05 MED ORDER — BUDESONIDE-FORMOTEROL FUMARATE 160-4.5 MCG/ACT IN AERO: INHALATION_SPRAY | RESPIRATORY_TRACT | 5 refills | Status: AC

## 2018-10-05 MED ORDER — CYCLOBENZAPRINE HCL 5 MG PO TABS: 5.0000 mg | ORAL_TABLET | Freq: Three times a day (TID) | ORAL | 0 refills | Status: AC | PRN

## 2018-10-05 MED ORDER — PANTOPRAZOLE SODIUM 40 MG PO TBEC: 40.0000 mg | DELAYED_RELEASE_TABLET | Freq: Two times a day (BID) | ORAL | 3 refills | Status: AC

## 2018-10-05 MED FILL — MELOXICAM 15 MG TABLET: 15 | 30 days supply | Qty: 30 | Fill #0

## 2018-10-05 MED FILL — GABAPENTIN 300 MG CAPSULE: 300 | 30 days supply | Qty: 180 | Fill #0

## 2018-10-05 MED FILL — CYCLOBENZAPRINE 5 MG TABLET: 5 | 5 days supply | Qty: 15 | Fill #0

## 2018-10-05 MED FILL — ALBUTEROL SULFATE HFA 108 (: 108 (90 BAS | 25 days supply | Qty: 18 | Fill #0

## 2018-10-05 NOTE — Progress Notes (Signed)
Per pt this is a asthma f.u and having trouble with her back and sciatica nerve and knees. But the left knee is worse and still been swollen since January and going down to her ankle.   Med refills Flexeril, Robaxin, Zofran, Tramadol, Vit D  Per pt she is interested in going to see pain management due to having Medicaid again.

## 2018-10-05 NOTE — Progress Notes (Signed)
Virtual Visit via Telephone Note  I connected with Margaret Owens on 10/05/18 at  1:30 PM EDT by telephone and verified that I am speaking with the correct person using two identifiers.   I discussed the limitations, risks, security and privacy concerns of performing an evaluation and management service by telephone and the availability of in person appointments. I also discussed with the patient that there may be a patient responsible charge related to this service. The patient expressed understanding and agreed to proceed.  Patient Location: Home Provider Location: Office Others participating in call: Call initiated by Emilio Aspen, RMA   History of Present Illness:       48 year old female seen in follow-up of asthma and issues with chronic low back pain as well as left knee pain secondary to osteoarthritis.  Patient with complaint of several weeks of worsening low back pain with radiation down to her right buttock.  Pain radiates down to the knee.  Pain is currently a 10 on a 0-to-10 scale.  Patient also states that she has had recent swelling of her left knee and her left ankle hurts and she states it is hard to walk due to pain.  She also tends to get stiffness in her back as well as her hips.  She reports that the knee pain is aching and throbbing and also ranges between 8 and a 10. She would like to have a refill of Mobic as well as muscle relaxant, Flexeril.  She also needs refill of gabapentin.  She did go to the emergency department at the end of May secondary to knee pain, abdominal pain and back pain.  Patient denies any current abdominal pain.  She continues to take her pantoprazole for acid reflux and has no current nausea, abdominal pain or reflux symptoms such as burping/belching or backwash of fluid into her throat.      She feels that her asthma is fairly well controlled but does tend to be triggered by hot weather and humidity as well as windy conditions at times.  She reports  no current issues with fever or chills, no increased nasal congestion, no shortness of breath, cough or wheezing.  No chest tightness.  She does require refill of her Zyrtec for control of allergy symptoms.   Past Medical History:  Diagnosis Date  . Anxiety   . Arthritis   . Asthma   . Bursitis   . Depression   . GERD (gastroesophageal reflux disease)   . Hypertension   . Muscle spasms of both lower extremities   . PTSD (post-traumatic stress disorder)   . Sciatica   . Seizures ([place])   . Uterine fibroid   Per recent ED note from 09/10/2018, patient with anemia with hemoglobin of 11.0.  Osteoarthritis of the left knee. Past Surgical History:  Procedure Laterality Date  . colonoscopy      Family History  Problem Relation Age of Onset  . Osteoarthritis Mother   . Breast cancer Maternal Grandmother        spread to lungs     Social History   Tobacco Use  . Smoking status: Never Smoker  . Smokeless tobacco: Never Used  Substance Use Topics  . Alcohol use: Yes    Comment: 1/week  . Drug use: No     Allergies  Allergen Reactions  . Latex Hives  . Penicillins Hives    Has patient had a PCN reaction causing immediate rash, facial/tongue/throat swelling, SOB or lightheadedness with hypotension:  No Has patient had a PCN reaction causing severe rash involving mucus membranes or skin necrosis: No Has patient had a PCN reaction that required hospitalization No Has patient had a PCN reaction occurring within the last 10 years: No If all of the above answers are "NO", then may proceed with Cephalosporin use. Has patient had a PCN reaction causing immediate rash, facial/tongue/throat swelling, SOB or lightheadedness with hypotension: No Has patient had a PCN reaction causing severe rash involving mucus membranes or skin necrosis: No Has patient had a PCN reaction that required hospitalization No Has patient had a PCN reaction occurring within the last 10 years: No If all of the  above answers are "NO", then may proceed with Cephalosporin use.       Observations/Objective: No vital signs or physical exam conducted as visit was done via telephone  Assessment and Plan: 1. Chronic pain of left knee Patient with recent acute on chronic left knee pain with swelling.  Prescription for meloxicam to take as needed for pain.  Take after eating.  Referral to orthopedics as she has seen Dr. Alfonso Ramus in the past for treatment of primary osteoarthritis of her left knee.. Pain management for chronic knee and low back pain with radiation. - AMB referral to orthopedics and pain management - meloxicam (MOBIC) 15 MG tablet; Take 1 tablet (15 mg total) by mouth daily. As needed for pain. Take after eating  Dispense: 30 tablet; Refill: 3  2. Low back pain with radiation Patient with complaint of recent increase of low back pain with radiation.  Prescription provided for gabapentin to help with radicular pain as well as Flexeril to help with muscle spasm.  Referral to orthopedics for further evaluation and treatment. - AMB referral to orthopedics and pain management - cyclobenzaprine (FLEXERIL) 5 MG tablet; Take 1 tablet (5 mg total) by mouth 3 (three) times daily as needed for muscle spasms.  Dispense: 15 tablet; Refill: 0 - gabapentin (NEURONTIN) 300 MG capsule; Take 2 pills twice per day and 2 pills at bedtime for nerve pain  Dispense: 240 capsule; Refill: 4  3. Mild intermittent asthma without complication Asthma remains well controlled.  Refill of Symbicort and albuterol. - albuterol (VENTOLIN HFA) 108 (90 Base) MCG/ACT inhaler; Inhale 2 puffs into the lungs every 6 (six) hours as needed for wheezing (wheezing).  Dispense: 18 g; Refill: 5 - budesonide-formoterol (SYMBICORT) 160-4.5 MCG/ACT inhaler; 2 puffs twice daily to control asthma  Dispense: 1 Inhaler; Refill: 5  4. Allergic rhinitis, unspecified seasonality, unspecified trigger Refill of generic Zyrtec for continued treatment of  allergic rhinitis. - cetirizine (ZYRTEC) 10 MG tablet; Take 1 tablet (10 mg total) by mouth daily.  Dispense: 30 tablet; Refill:  5. Gastroesophageal reflux disease, esophagitis presence not specified Refill pantoprazole for continued treatment of acid reflux as well as stomach protection secondary to current use of Mobic - pantoprazole (PROTONIX) 40 MG tablet; Take 1 tablet (40 mg total) by mouth 2 (two) times daily.  Dispense: 60 tablet; Refill: 3  Follow Up Instructions: 4 to 32-month follow-up and as needed.  Please have influenza immunization this fall.    I discussed the assessment and treatment plan with the patient. The patient was provided an opportunity to ask questions and all were answered. The patient agreed with the plan and demonstrated an understanding of the instructions.   The patient was advised to call back or seek an in-person evaluation if the symptoms worsen or if the condition fails to improve as anticipated.  I provided 11 minutes of non-face-to-face time during this encounter.   Antony Blackbird, MD

## 2018-10-07 MED FILL — FLUTICASONE PROP 50 MCG SPR: 50 | 30 days supply | Qty: 16 | Fill #1

## 2018-10-09 ENCOUNTER — Encounter: Payer: Self-pay | Admitting: Family Medicine

## 2018-10-24 ENCOUNTER — Encounter: Payer: Self-pay | Admitting: Physical Medicine & Rehabilitation

## 2018-10-31 ENCOUNTER — Encounter: Payer: Medicaid Other | Attending: Physical Medicine & Rehabilitation | Admitting: Physical Medicine & Rehabilitation

## 2018-10-31 ENCOUNTER — Other Ambulatory Visit: Payer: Self-pay

## 2018-10-31 ENCOUNTER — Encounter: Payer: Self-pay | Admitting: Physical Medicine & Rehabilitation

## 2018-10-31 VITALS — BP 118/81 | HR 97 | Temp 97.8°F | Ht 69.0 in | Wt 292.0 lb

## 2018-10-31 DIAGNOSIS — Z5181 Encounter for therapeutic drug level monitoring: Secondary | ICD-10-CM

## 2018-10-31 DIAGNOSIS — M47816 Spondylosis without myelopathy or radiculopathy, lumbar region: Secondary | ICD-10-CM

## 2018-10-31 DIAGNOSIS — M48061 Spinal stenosis, lumbar region without neurogenic claudication: Secondary | ICD-10-CM | POA: Diagnosis present

## 2018-10-31 DIAGNOSIS — M1712 Unilateral primary osteoarthritis, left knee: Secondary | ICD-10-CM
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
## 2018-10-31 NOTE — Patient Instructions (Addendum)
Will do spine injections next visit  Should be able to prescribe tramadol if urine test looks ok Knee Injection A knee injection is a procedure to get medicine into your knee joint to relieve the pain, swelling, and stiffness of arthritis. Your health care provider uses a needle to inject medicine, which may also help to lubricate and cushion your knee joint. You may need more than one injection. Tell a health care provider about:  Any allergies you have.  All medicines you are taking, including vitamins, herbs, eye drops, creams, and over-the-counter medicines.  Any problems you or family members have had with anesthetic medicines.  Any blood disorders you have.  Any surgeries you have had.  Any medical conditions you have.  Whether you are pregnant or may be pregnant. What are the risks? Generally, this is a safe procedure. However, problems may occur, including:  Infection.  Bleeding.  Symptoms that get worse.  Damage to the area around your knee.  Allergic reaction to any of the medicines.  Skin reactions from repeated injections. What happens before the procedure?  Ask your health care provider about changing or stopping your regular medicines. This is especially important if you are taking diabetes medicines or blood thinners.  Plan to have someone take you home from the hospital or clinic. What happens during the procedure?   You will sit or lie down in a position for your knee to be treated.  The skin over your kneecap will be cleaned with a germ-killing soap.  You will be given a medicine that numbs the area (local anesthetic). You may feel some stinging.  The medicine will be injected into your knee. The needle is carefully placed between your kneecap and your knee. The medicine is injected into the joint space.  The needle will be removed at the end of the procedure.  A bandage (dressing) may be placed over the injection site. The procedure may vary  among health care providers and hospitals. What can I expect after the procedure?  Your blood pressure, heart rate, breathing rate, and blood oxygen level will be monitored until you leave the hospital or clinic.  You may have to move your knee through its full range of motion. This helps to get all the medicine into your joint space.  You will be watched to make sure that you do not have a reaction to the injected medicine.  You may feel more pain, swelling, and warmth than you did before the injection. This reaction may last about 1-2 days. Follow these instructions at home: Medicines  Take over-the-counter and prescription medicines only as told by your doctor.  Do not drive or use heavy machinery while taking prescription pain medicine.  Do not take medicines such as aspirin and ibuprofen unless your health care provider tells you to take them. Injection site care  Follow instructions from your health care provider about: ? How to take care of your puncture site. ? When and how you should change your dressing. ? When you should remove your dressing.  Check your injection area every day for signs of infection. Check for: ? More redness, swelling, or pain after 2 days. ? Fluid or blood. ? Pus or a bad smell. ? Warmth. Managing pain, stiffness, and swelling   If directed, put ice on the injection area: ? Put ice in a plastic bag. ? Place a towel between your skin and the bag. ? Leave the ice on for 20 minutes, 2-3 times per  day.  Do not apply heat to your knee.  Raise (elevate) the injection area above the level of your heart while you are sitting or lying down. General instructions  If you were given a dressing, keep it dry until your health care provider says it can be removed. Ask your health care provider when you can start showering or taking a bath.  Avoid strenuous activities for as long as directed by your health care provider. Ask your health care provider when  you can return to your normal activities.  Keep all follow-up visits as told by your health care provider. This is important. You may need more injections. Contact a health care provider if you have:  A fever.  Warmth in your injection area.  Fluid, blood, or pus coming from your injection site.  Symptoms at your injection site that last longer than 2 days after your procedure. Get help right away if:  Your knee: ? Turns very red. ? Becomes very swollen. ? Is in severe pain. Summary  A knee injection is a procedure to get medicine into your knee joint to relieve the pain, swelling, and stiffness of arthritis.  A needle is carefully placed between your kneecap and your knee to inject medicine into the joint space.  Before the procedure, ask your health care provider about changing or stopping your regular medicines, especially if you are taking diabetes medicines or blood thinners.  Contact your health care provider if you have any problems or questions after your procedure. This information is not intended to replace advice given to you by your health care provider. Make sure you discuss any questions you have with your health care provider. Document Released: 06/21/2006 Document Revised: 04/19/2017 Document Reviewed: 04/19/2017 Elsevier Patient Education  2020 [employer].

## 2018-11-04 LAB — TOXASSURE SELECT,+ANTIDEPR,UR

## 2018-11-08 ENCOUNTER — Telehealth: Payer: Self-pay | Admitting: *Deleted

## 2018-11-08 NOTE — Telephone Encounter (Signed)
Urine drug screen for this encounter is consistent for having no prescribed or unprescribed controlled medications.

## 2018-11-21 ENCOUNTER — Ambulatory Visit: Payer: Self-pay | Admitting: Registered Nurse

## 2018-12-05 ENCOUNTER — Encounter: Payer: Medicaid Other | Admitting: Physical Medicine & Rehabilitation

## 2018-12-15 MED FILL — GABAPENTIN 300 MG CAPSULE: 300 | 30 days supply | Qty: 180 | Fill #0

## 2018-12-15 MED FILL — CYCLOBENZAPRINE 5 MG TABLET: 5 | 5 days supply | Qty: 15 | Fill #0

## 2018-12-15 MED FILL — PROAIR HFA 90 MCG INHALER: 108 (90 BAS | 25 days supply | Qty: 9 | Fill #0

## 2018-12-15 MED FILL — MELOXICAM 15 MG TABLET: 15 | 30 days supply | Qty: 30 | Fill #0

## 2018-12-16 ENCOUNTER — Encounter: Payer: Medicaid Other | Attending: Physical Medicine & Rehabilitation | Admitting: Physical Medicine & Rehabilitation

## 2018-12-16 DIAGNOSIS — M1712 Unilateral primary osteoarthritis, left knee: Secondary | ICD-10-CM | POA: Insufficient documentation

## 2018-12-16 DIAGNOSIS — Z5181 Encounter for therapeutic drug level monitoring: Secondary | ICD-10-CM | POA: Insufficient documentation

## 2018-12-16 DIAGNOSIS — M48061 Spinal stenosis, lumbar region without neurogenic claudication: Secondary | ICD-10-CM | POA: Insufficient documentation

## 2018-12-16 DIAGNOSIS — M47816 Spondylosis without myelopathy or radiculopathy, lumbar region: Secondary | ICD-10-CM | POA: Insufficient documentation

## 2019-01-19 ENCOUNTER — Other Ambulatory Visit: Payer: Self-pay

## 2019-01-19 ENCOUNTER — Encounter: Payer: Medicaid Other | Attending: Physical Medicine & Rehabilitation | Admitting: Physical Medicine & Rehabilitation

## 2019-01-19 ENCOUNTER — Encounter: Payer: Self-pay | Admitting: Physical Medicine & Rehabilitation

## 2019-01-19 VITALS — BP 130/82 | HR 83 | Temp 97.5°F | Ht 69.0 in | Wt 293.0 lb

## 2019-01-19 DIAGNOSIS — M47816 Spondylosis without myelopathy or radiculopathy, lumbar region: Secondary | ICD-10-CM | POA: Diagnosis present

## 2019-01-19 DIAGNOSIS — M1712 Unilateral primary osteoarthritis, left knee: Secondary | ICD-10-CM | POA: Diagnosis present

## 2019-01-19 DIAGNOSIS — Z5181 Encounter for therapeutic drug level monitoring: Secondary | ICD-10-CM | POA: Diagnosis not present

## 2019-01-19 DIAGNOSIS — M48061 Spinal stenosis, lumbar region without neurogenic claudication: Secondary | ICD-10-CM | POA: Diagnosis present

## 2019-01-19 NOTE — Progress Notes (Signed)
Bilateral Lumbar L3, L4  medial branch blocks and L 5 dorsal ramus injection under fluoroscopic guidance  Indication: Lumbar pain which is not relieved by medication management or other conservative care and interfering with self-care and mobility.  Informed consent was obtained after describing risks and benefits of the procedure with the patient, this includes bleeding, infection, paralysis and medication side effects.  The patient wishes to proceed and has given written consent.  The patient was placed in prone position.  The lumbar area was marked and prepped with Betadine.  One mL of 1% lidocaine was injected into each of 6 areas into the skin and subcutaneous tissue.  Then a 22-gauge 5in spinal needle was inserted targeting the junction of the left S1 superior articular process and sacral ala junction. Needle was advanced under fluoroscopic guidance.  Bone contact was made.  Isovue 200 was injected x 0.5 mL demonstrating no intravascular uptake.  Then a solution  of 2% MPF lidocaine was injected x 0.5 mL.  Then the left L5 superior articular process in transverse process junction was targeted.  Bone contact was made.  Isovue 200 was injected x 0.5 mL demonstrating no intravascular uptake. Then a solution containing  2% MPF lidocaine was injected x 0.5 mL.  Then the left L4 superior articular process in transverse process junction was targeted.  Bone contact was made.  Isovue 200 was injected x 0.5 mL demonstrating no intravascular uptake.  Then a solution containing2% MPF lidocaine was injected x 0.5 mL.  This same procedure was performed on the right side using the same needle, technique and injectate.  Patient tolerated procedure well.  Post procedure instructions were given.  Preinjection pain 10/10 Postinjection pain 8/10

## 2019-01-19 NOTE — Patient Instructions (Signed)

## 2019-01-19 NOTE — Progress Notes (Signed)
  PROCEDURE RECORD [facility]   Name: Margaret Owens DOB:07-10-1970 MRN: [id number]  Date:01/19/2019  Physician: Alysia Penna, MD    Nurse/CMA: Wessling, CMA  Allergies:  Allergies  Allergen Reactions  . Latex Hives  . Penicillins Hives    Has patient had a PCN reaction causing immediate rash, facial/tongue/throat swelling, SOB or lightheadedness with hypotension: No Has patient had a PCN reaction causing severe rash involving mucus membranes or skin necrosis: No Has patient had a PCN reaction that required hospitalization No Has patient had a PCN reaction occurring within the last 10 years: No If all of the above answers are "NO", then may proceed with Cephalosporin use. Has patient had a PCN reaction causing immediate rash, facial/tongue/throat swelling, SOB or lightheadedness with hypotension: No Has patient had a PCN reaction causing severe rash involving mucus membranes or skin necrosis: No Has patient had a PCN reaction that required hospitalization No Has patient had a PCN reaction occurring within the last 10 years: No If all of the above answers are "NO", then may proceed with Cephalosporin use.    Consent Signed: Yes.    Is patient diabetic? No.  CBG today?   Pregnant: No. LMP: No LMP recorded. (age 32-55)  Anticoagulants: no Anti-inflammatory: no Antibiotics: no  Procedure: Bilateral L3-4-5 Medial Branch Block Position: Prone Start Time: 11:05am      End Time: 11:18am          Fluoro Time: 70s  RN/CMA Juvon Teater,CMA Wessling, CMA    Time 10:46am 11:24am    BP 130/82 160/86    Pulse 83 88    Respirations 16 16    O2 Sat 97 98    S/S 6 6    Pain Level 10/10 8/10     D/C home with no one, patient A & O X 3, D/C instructions reviewed, and sits independently.

## 2019-01-23 ENCOUNTER — Other Ambulatory Visit: Payer: Self-pay | Admitting: *Deleted

## 2019-01-23 NOTE — Telephone Encounter (Signed)
Faxed Rx for knee brace to [place] [facility]

## 2019-02-16 ENCOUNTER — Encounter: Payer: Self-pay | Admitting: Physical Medicine & Rehabilitation

## 2019-02-16 ENCOUNTER — Other Ambulatory Visit: Payer: Self-pay

## 2019-02-16 ENCOUNTER — Encounter: Payer: Medicaid Other | Attending: Physical Medicine & Rehabilitation | Admitting: Physical Medicine & Rehabilitation

## 2019-02-16 VITALS — BP 125/83 | HR 84 | Temp 97.5°F | Ht 69.0 in | Wt 292.0 lb

## 2019-02-16 DIAGNOSIS — M1712 Unilateral primary osteoarthritis, left knee: Secondary | ICD-10-CM | POA: Diagnosis present

## 2019-02-16 DIAGNOSIS — M48061 Spinal stenosis, lumbar region without neurogenic claudication: Secondary | ICD-10-CM | POA: Insufficient documentation

## 2019-02-16 DIAGNOSIS — Z5181 Encounter for therapeutic drug level monitoring: Secondary | ICD-10-CM | POA: Diagnosis present

## 2019-02-16 DIAGNOSIS — M47816 Spondylosis without myelopathy or radiculopathy, lumbar region: Secondary | ICD-10-CM | POA: Diagnosis present

## 2019-02-16 DIAGNOSIS — M533 Sacrococcygeal disorders, not elsewhere classified: Secondary | ICD-10-CM

## 2019-02-16 NOTE — Patient Instructions (Addendum)
Sacroiliac Joint Injection, Care After This sheet gives you information about how to care for yourself after your procedure. Your health care provider may also give you more specific instructions. If you have problems or questions, contact your health care provider. What can I expect after the procedure? After the procedure, it is common to have bruising or soreness at the injection site. Follow these instructions at home: Managing pain and swelling      Keep a record of your pain (pain log) to share with your health care provider at your follow-up visit. If a long-acting anti-inflammatory medicine (steroid) was included in your injection, you may not notice an improvement in your pain level for a few days.  Take over-the-counter and prescription medicines only as told by your health care provider.  Do not use a heating pad and do not apply heat directly to the area after the procedure.  Ask your health care provider about using cold therapy.  If directed, put ice on the affected area. ? Put ice in a plastic bag. ? Place a towel between your skin and the bag. ? Leave the ice on for 20 minutes, 2-3 times a day. Do not use cold therapy for more than 24 hours.  Check your injection site every day for signs of infection. Check for: ? Redness, swelling, or pain. ? Fluid or blood. ? Warmth. ? Pus or a bad smell. Activity  Rest the day of your injection. Avoid doing a lot of activity on the day of the procedure.  Avoid any activities that take a lot of effort for 24 hours after the injection.  Return to your normal activities as told by your health care provider. Ask your health care provider what activities are safe for you.  Do physical therapy exercises as told by your health care provider or physical therapist. These exercises are used to strengthen muscles surrounding the SI joint, and that will help to relieve pain. General instructions  If dye was used during your procedure,  drink plenty of water to flush the dye out of your body.  Do not take baths, swim, or use a hot tub until your health care provider approves. You may take showers.  Do not drive for 24 hours if you were given a medicine to help you relax (sedative) during your procedure.  Do not use any products that contain nicotine or tobacco, such as cigarettes and e-cigarettes. If you need help quitting, ask your health care provider.  Keep all follow-up visits as told by your health care provider. This is important. Contact a health care provider if:  Your pain does not improve or it gets worse.  You have numbness, tingling, or weakness.  You have a fever.  You have redness, swelling, or pain around your injection site.  You have fluid or blood coming from your injection site.  Your injection site feels warm to the touch.  You have pus or a bad smell coming from your injection site. Get help right away if:  You have chest pain or shortness of breath. Summary  After the procedure, it is common to have bruising or soreness at the injection site.  Keep a record of your pain (pain log) to share with your health care provider at your follow-up visit.  Return to your normal activities as told by your health care provider. Ask your health care provider what activities are safe for you.  Contact your health care provider if you have pain that is  getting worse, weakness, numbness, or any sign of infection at your injection site. This information is not intended to replace advice given to you by your health care provider. Make sure you discuss any questions you have with your health care provider. Document Released: 01/04/2017 Document Revised: 03/12/2017 Document Reviewed: 01/04/2017 Elsevier Patient Education  2020 [place] DASH stands for "Dietary Approaches to Stop Hypertension." The DASH eating plan is a healthy eating plan that has been shown to reduce high blood  pressure (hypertension). It may also reduce your risk for type 2 diabetes, heart disease, and stroke. The DASH eating plan may also help with weight loss. What are tips for following this plan?  General guidelines Avoid eating more than 2,300 mg (milligrams) of salt (sodium) a day. If you have hypertension, you may need to reduce your sodium intake to 1,500 mg a day. Limit alcohol intake to no more than 1 drink a day for nonpregnant women and 2 drinks a day for men. One drink equals 12 oz of beer, 5 oz of wine, or 1 oz of hard liquor. Work with your health care provider to maintain a healthy body weight or to lose weight. Ask what an ideal weight is for you. Get at least 30 minutes of exercise that causes your heart to beat faster (aerobic exercise) most days of the week. Activities may include walking, swimming, or biking. Work with your health care provider or diet and nutrition specialist (dietitian) to adjust your eating plan to your individual calorie needs. Reading food labels  Check food labels for the amount of sodium per serving. Choose foods with less than 5 percent of the Daily Value of sodium. Generally, foods with less than 300 mg of sodium per serving fit into this eating plan. To find whole grains, look for the word "whole" as the first word in the ingredient list. Shopping Buy products labeled as "low-sodium" or "no salt added." Buy fresh foods. Avoid canned foods and premade or frozen meals. Cooking Avoid adding salt when cooking. Use salt-free seasonings or herbs instead of table salt or sea salt. Check with your health care provider or pharmacist before using salt substitutes. Do not fry foods. Cook foods using healthy methods such as baking, boiling, grilling, and broiling instead. Cook with heart-healthy oils, such as olive, canola, soybean, or sunflower oil. Meal planning Eat a balanced diet that includes: 5 or more servings of fruits and vegetables each day. At each  meal, try to fill half of your plate with fruits and vegetables. Up to 6-8 servings of whole grains each day. Less than 6 oz of lean meat, poultry, or fish each day. A 3-oz serving of meat is about the same size as a deck of cards. One egg equals 1 oz. 2 servings of low-fat dairy each day. A serving of nuts, seeds, or beans 5 times each week. Heart-healthy fats. Healthy fats called Omega-3 fatty acids are found in foods such as flaxseeds and coldwater fish, like sardines, salmon, and mackerel. Limit how much you eat of the following: Canned or prepackaged foods. Food that is high in trans fat, such as fried foods. Food that is high in saturated fat, such as fatty meat. Sweets, desserts, sugary drinks, and other foods with added sugar. Full-fat dairy products. Do not salt foods before eating. Try to eat at least 2 vegetarian meals each week. Eat more home-cooked food and less restaurant, buffet, and fast food. When eating at a restaurant,  ask that your food be prepared with less salt or no salt, if possible. What foods are recommended? The items listed may not be a complete list. Talk with your dietitian about what dietary choices are best for you. Grains Whole-grain or whole-wheat bread. Whole-grain or whole-wheat pasta. Brown rice. [place]. Bulgur. Whole-grain and low-sodium cereals. Pita bread. Low-fat, low-sodium crackers. Whole-wheat flour tortillas. Vegetables Fresh or frozen vegetables (raw, steamed, roasted, or grilled). Low-sodium or reduced-sodium tomato and vegetable juice. Low-sodium or reduced-sodium tomato sauce and tomato paste. Low-sodium or reduced-sodium canned vegetables. Fruits All fresh, dried, or frozen fruit. Canned fruit in natural juice (without added sugar). Meat and other protein foods Skinless chicken or [place]. Ground chicken or [place]. Pork with fat trimmed off. Fish and seafood. Egg whites. Dried beans, peas, or lentils. Unsalted nuts, nut butters, and  seeds. Unsalted canned beans. Lean cuts of beef with fat trimmed off. Low-sodium, lean deli meat. Dairy Low-fat (1%) or fat-free (skim) milk. Fat-free, low-fat, or reduced-fat cheeses. Nonfat, low-sodium ricotta or cottage cheese. Low-fat or nonfat yogurt. Low-fat, low-sodium cheese. Fats and oils Soft margarine without trans fats. Vegetable oil. Low-fat, reduced-fat, or light mayonnaise and salad dressings (reduced-sodium). Canola, safflower, olive, soybean, and sunflower oils. Avocado. Seasoning and other foods Herbs. Spices. Seasoning mixes without salt. Unsalted popcorn and pretzels. Fat-free sweets. What foods are not recommended? The items listed may not be a complete list. Talk with your dietitian about what dietary choices are best for you. Grains Baked goods made with fat, such as croissants, muffins, or some breads. Dry pasta or rice meal packs. Vegetables Creamed or fried vegetables. Vegetables in a cheese sauce. Regular canned vegetables (not low-sodium or reduced-sodium). Regular canned tomato sauce and paste (not low-sodium or reduced-sodium). Regular tomato and vegetable juice (not low-sodium or reduced-sodium). Angie Fava. Olives. Fruits Canned fruit in a light or heavy syrup. Fried fruit. Fruit in cream or butter sauce. Meat and other protein foods Fatty cuts of meat. Ribs. Fried meat. Berniece Salines. Sausage. Bologna and other processed lunch meats. Salami. Fatback. Hotdogs. Bratwurst. Salted nuts and seeds. Canned beans with added salt. Canned or smoked fish. Whole eggs or egg yolks. Chicken or [place] with skin. Dairy Whole or 2% milk, cream, and half-and-half. Whole or full-fat cream cheese. Whole-fat or sweetened yogurt. Full-fat cheese. Nondairy creamers. Whipped toppings. Processed cheese and cheese spreads. Fats and oils Butter. Stick margarine. Lard. Shortening. Ghee. Bacon fat. Tropical oils, such as coconut, palm kernel, or palm oil. Seasoning and other foods Salted popcorn and  pretzels. Onion salt, garlic salt, seasoned salt, table salt, and sea salt. Worcestershire sauce. Tartar sauce. Barbecue sauce. Teriyaki sauce. Soy sauce, including reduced-sodium. Steak sauce. Canned and packaged gravies. Fish sauce. Oyster sauce. Cocktail sauce. Horseradish that you find on the shelf. Ketchup. Mustard. Meat flavorings and tenderizers. Bouillon cubes. Hot sauce and Tabasco sauce. Premade or packaged marinades. Premade or packaged taco seasonings. Relishes. Regular salad dressings. Where to find more information: National Heart, Lung, and [place]: https://wilson-eaton.com/ American Heart Association: www.heart.org Summary The DASH eating plan is a healthy eating plan that has been shown to reduce high blood pressure (hypertension). It may also reduce your risk for type 2 diabetes, heart disease, and stroke. With the DASH eating plan, you should limit salt (sodium) intake to 2,300 mg a day. If you have hypertension, you may need to reduce your sodium intake to 1,500 mg a day. When on the DASH eating plan, aim to eat more fresh fruits and vegetables, whole grains,  lean proteins, low-fat dairy, and heart-healthy fats. Work with your health care provider or diet and nutrition specialist (dietitian) to adjust your eating plan to your individual calorie needs. This information is not intended to replace advice given to you by your health care provider. Make sure you discuss any questions you have with your health care provider. Document Released: 03/19/2011 Document Revised: 03/12/2017 Document Reviewed: 03/23/2016 Elsevier Patient Education  2020 [employer].

## 2019-02-16 NOTE — Progress Notes (Signed)
  PROCEDURE RECORD [facility]   Name: Margaret Owens DOB:1971-04-04 MRN: [id number]  Date:02/16/2019  Physician: Alysia Penna, MD    Nurse/CMA: Truman Hayward, CMA  Allergies:  Allergies  Allergen Reactions  . Latex Hives  . Penicillins Hives    Has patient had a PCN reaction causing immediate rash, facial/tongue/throat swelling, SOB or lightheadedness with hypotension: No Has patient had a PCN reaction causing severe rash involving mucus membranes or skin necrosis: No Has patient had a PCN reaction that required hospitalization No Has patient had a PCN reaction occurring within the last 10 years: No If all of the above answers are "NO", then may proceed with Cephalosporin use. Has patient had a PCN reaction causing immediate rash, facial/tongue/throat swelling, SOB or lightheadedness with hypotension: No Has patient had a PCN reaction causing severe rash involving mucus membranes or skin necrosis: No Has patient had a PCN reaction that required hospitalization No Has patient had a PCN reaction occurring within the last 10 years: No If all of the above answers are "NO", then may proceed with Cephalosporin use.    Consent Signed: Yes.    Is patient diabetic? No.  CBG today?   Pregnant: No. LMP: No LMP recorded. (age 5-55)  Anticoagulants: no Anti-inflammatory: yes (meloxicam) Antibiotics: no  Procedure: Bilateral Sacroiliac Joint Injection Position: Prone Start Time: 11:42am End Time:11:48am  Fluoro Time:31  RN/CMA Truman Hayward, CMA Betrice Wanat, CMA    Time 11:22am 11:52am    BP 125/83 149/68    Pulse 84 78    Respirations 16 16    O2 Sat 97 98    S/S 6 6    Pain Level 8/10 6/10     D/C home with no one, patient A & O X 3, D/C instructions reviewed, and sits independently.

## 2019-02-16 NOTE — Progress Notes (Signed)
Bilateral sacroiliac injections under fluoroscopic guidance  Indication: Low back and buttocks pain not relieved by medication management and other conservative care.  Informed consent was obtained after describing risks and benefits of the procedure with the patient, this includes bleeding, bruising, infection, paralysis and medication side effects. The patient wishes to proceed and has given written consent. The patient was placed in a prone position. The lumbar and sacral area was marked and prepped with Betadine. A 25-gauge 1-1/2 inch needle was inserted into the skin and subcutaneous tissue and 1 mL of 1% lidocaine was injected into each side. Then a 25-gauge 5 inch spinal needle was inserted under fluoroscopic guidance into the left sacroiliac joint. AP and lateral images were utilized. Omnipaque 180x0.5 mL under live fluoroscopy demonstrated no intravascular uptake. Then a solution containing one ML of 6 mg per mL Celestone in 2 ML of 2% lidocaine MPF was injected x1.5 mL. This same procedure was repeated on the right side using the same needle, injectate, and technique. Patient tolerated the procedure well. Post procedure instructions were given. Please see post procedure form.

## 2019-03-28 ENCOUNTER — Emergency Department (HOSPITAL_BASED_OUTPATIENT_CLINIC_OR_DEPARTMENT_OTHER)
Admission: EM | Admit: 2019-03-28 | Discharge: 2019-03-28 | Disposition: A | Discharge status: Home / Self Care | Payer: Medicaid Other | Attending: Emergency Medicine | Admitting: Emergency Medicine

## 2019-03-28 ENCOUNTER — Other Ambulatory Visit: Payer: Self-pay

## 2019-03-28 ENCOUNTER — Emergency Department (HOSPITAL_BASED_OUTPATIENT_CLINIC_OR_DEPARTMENT_OTHER): Payer: Medicaid Other

## 2019-03-28 ENCOUNTER — Encounter (HOSPITAL_BASED_OUTPATIENT_CLINIC_OR_DEPARTMENT_OTHER): Payer: Self-pay

## 2019-03-28 DIAGNOSIS — R1084 Generalized abdominal pain: Secondary | ICD-10-CM | POA: Insufficient documentation

## 2019-03-28 DIAGNOSIS — R109 Unspecified abdominal pain: Secondary | ICD-10-CM | POA: Diagnosis present

## 2019-03-28 DIAGNOSIS — J45909 Unspecified asthma, uncomplicated: Secondary | ICD-10-CM | POA: Diagnosis not present

## 2019-03-28 DIAGNOSIS — K5900 Constipation, unspecified: Secondary | ICD-10-CM | POA: Diagnosis not present

## 2019-03-28 DIAGNOSIS — Z79899 Other long term (current) drug therapy: Secondary | ICD-10-CM | POA: Insufficient documentation

## 2019-03-28 DIAGNOSIS — I10 Essential (primary) hypertension: Secondary | ICD-10-CM | POA: Insufficient documentation

## 2019-03-28 LAB — COMPREHENSIVE METABOLIC PANEL
ALT: 14 U/L (ref 0–44)
AST: 18 U/L (ref 15–41)
Albumin: 3.9 g/dL (ref 3.5–5.0)
Alkaline Phosphatase: 82 U/L (ref 38–126)
Anion gap: 8 (ref 5–15)
BUN: 8 mg/dL (ref 6–20)
CO2: 26 mmol/L (ref 22–32)
Calcium: 9.2 mg/dL (ref 8.9–10.3)
Chloride: 105 mmol/L (ref 98–111)
Creatinine, Ser: 0.62 mg/dL (ref 0.44–1.00)
GFR calc Af Amer: >60 mL/min (ref >60)
GFR calc non Af Amer: >60 mL/min (ref >60)
Glucose, Bld: 137 mg/dL — ABNORMAL HIGH (ref 70–99)
Potassium: 4.3 mmol/L (ref 3.5–5.1)
Sodium: 139 mmol/L (ref 135–145)
Total Bilirubin: 0.2 mg/dL — ABNORMAL LOW (ref 0.3–1.2)
Total Protein: 8.6 g/dL — ABNORMAL HIGH (ref 6.5–8.1)

## 2019-03-28 LAB — CBC
HCT: 40.3 % (ref 36.0–46.0)
Hemoglobin: 12.7 g/dL (ref 12.0–15.0)
MCH: 26 pg (ref 26.0–34.0)
MCHC: 31.5 g/dL (ref 30.0–36.0)
MCV: 82.6 fL (ref 80.0–100.0)
Platelets: 508 10*3/uL — ABNORMAL HIGH (ref 150–400)
RBC: 4.88 MIL/uL (ref 3.87–5.11)
RDW: 14.6 % (ref 11.5–15.5)
WBC: 10 10*3/uL (ref 4.0–10.5)
nRBC: 0 % (ref 0.0–0.2)

## 2019-03-28 LAB — URINALYSIS, ROUTINE W REFLEX MICROSCOPIC
Bilirubin Urine: NEGATIVE
Glucose, UA: NEGATIVE mg/dL
Hgb urine dipstick: NEGATIVE
Ketones, ur: NEGATIVE mg/dL
Leukocytes,Ua: NEGATIVE
Nitrite: NEGATIVE
Protein, ur: NEGATIVE mg/dL
Specific Gravity, Urine: 1.025 (ref 1.005–1.030)
pH: 6 (ref 5.0–8.0)

## 2019-03-28 LAB — LIPASE, BLOOD: Lipase: 25 U/L (ref 11–51)

## 2019-03-28 LAB — PREGNANCY, URINE: Preg Test, Ur: NEGATIVE

## 2019-03-28 MED ORDER — SODIUM CHLORIDE 0.9% FLUSH
3.0000 mL | Freq: Once | INTRAVENOUS | Status: AC
  Administered 2019-03-28: 16:00:00 3 mL via INTRAVENOUS
  Filled 2019-03-28: qty 3

## 2019-03-28 MED ORDER — POLYETHYLENE GLYCOL 3350 17 G PO PACK: 17.0000 g | PACK | Freq: Every day | ORAL | 0 refills | Status: AC

## 2019-03-28 MED ORDER — IOHEXOL 300 MG/ML  SOLN
100.0000 mL | Freq: Once | INTRAMUSCULAR | Status: AC
  Administered 2019-03-28: 100 mL via INTRAVENOUS

## 2019-03-28 NOTE — ED Triage Notes (Signed)
Pt c/o abd pain x 3 days. Pt has had diarrhea, constipation, and nausea. Pt also reports she missed her menstrual cycle on the 12/9.

## 2019-03-28 NOTE — ED Provider Notes (Signed)
[facility] DEPART[facility] Provider Note   CSN: [id number] Arrival date & time: 03/28/19  1516     History Chief Complaint  Patient presents with  . Abdominal Pain    Myalee E Chidester is a 48 y.o. female.  The history is provided by the patient. No language interpreter was used.  Abdominal Pain Pain location:  Generalized Pain quality: aching   Pain radiates to:  Epigastric region and periumbilical region Pain severity:  Moderate Onset quality:  Gradual Timing:  Constant Progression:  Worsening Chronicity:  New Relieved by:  Nothing Worsened by:  Nothing Ineffective treatments:  None tried Pt complains of abdominal pain.  Pt reports she had a week of diarrhea with pain.  Pt reports this week she feels constipated and swollen       Past Medical History:  Diagnosis Date  . Anxiety   . Arthritis   . Asthma   . Bursitis   . Depression   . GERD (gastroesophageal reflux disease)   . Hypertension   . Muscle spasms of both lower extremities   . PTSD (post-traumatic stress disorder)   . Sciatica   . Seizures ([place])   . Uterine fibroid     Patient Active Problem List   Diagnosis Date Noted  . Primary osteoarthritis of left knee 07/04/2018  . Uterine fibroid 06/09/2017  . 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 colonoscopy       OB History    Gravida  3   Para  3   Term  3   Preterm      AB      Living  3     SAB  0   TAB  0   Ectopic  0   Multiple  0   Live Births  3           Family History  Problem Relation Age of Onset  . Osteoarthritis Mother   . Breast cancer Maternal Grandmother        spread to lungs     Social History   Tobacco Use  . Smoking status: Never Smoker  . Smokeless tobacco: Never Used  Substance Use Topics  . Alcohol use: Yes    Comment: 1/week  . Drug use: No    Home Medications Prior to Admission medications   Medication Sig  Start Date End Date Taking? Authorizing Provider  albuterol (VENTOLIN HFA) 108 (90 Base) MCG/ACT inhaler Inhale 2 puffs into the lungs every 6 (six) hours as needed for wheezing (wheezing). 10/05/18   Fulp, Ander Gaster, MD  budesonide-formoterol (SYMBICORT) 160-4.5 MCG/ACT inhaler 2 puffs twice daily to control asthma 10/05/18   Fulp, Cammie, MD  cetirizine (ZYRTEC) 10 MG tablet Take 1 tablet (10 mg total) by mouth daily. 10/05/18   Fulp, Cammie, MD  clotrimazole (LOTRIMIN) 1 % cream Apply to affected area 2 times daily 10/09/17   Charlann Lange, PA-C  cyclobenzaprine (FLEXERIL) 5 MG tablet Take 1 tablet (5 mg total) by mouth 3 (three) times daily as needed for muscle spasms. 10/05/18   Fulp, Cammie, MD  desonide (DESOWEN) 0.05 % cream Apply 1 application topically 2 (two) times daily.    [provider]  diclofenac sodium (VOLTAREN) 1 % GEL Apply 4 grams every 6 hours (or 4 times per day) to the knees if needed for pain 09/10/18   Drenda Freeze, MD  DULoxetine (CYMBALTA) 30 MG capsule Take 30 mg  by mouth 2 (two) times daily.    [provider]  fluticasone (FLONASE) 50 MCG/ACT nasal spray Place 2 sprays into both nostrils daily. 12/17/17   Fulp, Cammie, MD  gabapentin (NEURONTIN) 300 MG capsule Take 2 pills twice per day and 2 pills at bedtime for nerve pain 10/05/18   Fulp, Cammie, MD  meloxicam (MOBIC) 15 MG tablet Take 1 tablet (15 mg total) by mouth daily. As needed for pain. Take after eating 10/05/18   Fulp, Cammie, MD  pantoprazole (PROTONIX) 40 MG tablet Take 1 tablet (40 mg total) by mouth 2 (two) times daily. 10/05/18   Fulp, Cammie, MD  phentermine 37.5 MG capsule Take 37.5 mg by mouth every morning.    [provider]  polyethylene glycol (MIRALAX) 17 g packet Take 17 g by mouth daily. 03/28/19   Fransico Meadow, PA-C  tiZANidine (ZANAFLEX) 4 MG tablet Take 1 tablet (4 mg total) by mouth at bedtime. As needed for muscle spasm 04/18/18   Fulp, Cammie, MD  traZODone (DESYREL)  50 MG tablet Take 50 mg by mouth at bedtime as needed for sleep (sleep).     [provider]    Allergies    Latex and Penicillins  Review of Systems   Review of Systems  Gastrointestinal: Positive for abdominal pain.  All other systems reviewed and are negative.   Physical Exam Updated Vital Signs BP 106/75 (BP Location: Right Arm)   Pulse 75   Temp 99.4 F (37.4 C) (Oral)   Resp 14   Ht 5\' 9"  (1.753 m)   Wt 132.6 kg   LMP 02/20/2019   SpO2 100%   BMI 43.17 kg/m   Physical Exam Vitals and nursing note reviewed.  Constitutional:      Appearance: She is well-developed.  HENT:     Head: Normocephalic.  Cardiovascular:     Rate and Rhythm: Normal rate.  Pulmonary:     Effort: Pulmonary effort is normal.  Abdominal:     General: Abdomen is flat. Bowel sounds are normal. There is no distension.     Palpations: Abdomen is soft.     Tenderness: There is generalized abdominal tenderness.  Musculoskeletal:        General: Normal range of motion.     Cervical back: Normal range of motion.  Skin:    General: Skin is warm.  Neurological:     General: No focal deficit present.     Mental Status: She is alert and oriented to person, place, and time.  Psychiatric:        Mood and Affect: Mood normal.     ED Results / Procedures / Treatments   Labs (all labs ordered are listed, but only abnormal results are displayed) Labs Reviewed  COMPREHENSIVE METABOLIC PANEL - Abnormal; Notable for the following components:      Result Value   Glucose, Bld 137 (*)    Total Protein 8.6 (*)    Total Bilirubin 0.2 (*)    All other components within normal limits  CBC - Abnormal; Notable for the following components:   Platelets 508 (*)    All other components within normal limits  LIPASE, BLOOD  URINALYSIS, ROUTINE W REFLEX MICROSCOPIC  PREGNANCY, URINE    EKG None  Radiology CT ABDOMEN PELVIS W CONTRAST  Result Date: 03/28/2019 CLINICAL DATA:  Abdominal pain,  nausea, diarrhea and constipation for 3 days. EXAM: CT ABDOMEN AND PELVIS WITH CONTRAST TECHNIQUE: Multidetector CT imaging of the abdomen and  pelvis was performed using the standard protocol following bolus administration of intravenous contrast. CONTRAST:  100 mL OMNIPAQUE IOHEXOL 300 MG/ML  SOLN COMPARISON:  CT abdomen and pelvis 09/10/2018. FINDINGS: Lower chest: There is some dependent atelectasis in the right lung base. Lung bases otherwise clear. No pleural or pericardial effusion. Hepatobiliary: No focal liver abnormality is seen. No gallstones, gallbladder wall thickening, or biliary dilatation. The liver is diffusely low attenuating and measures approximately 20 cm craniocaudal. Pancreas: Unremarkable. No pancreatic ductal dilatation or surrounding inflammatory changes. Spleen: Normal in size without focal abnormality. Adrenals/Urinary Tract: Adrenal glands are unremarkable. Kidneys are normal, without renal calculi, focal lesion, or hydronephrosis. Bladder is unremarkable. Stomach/Bowel: Stomach is within normal limits. Appendix appears normal. No evidence of bowel wall thickening, distention, or inflammatory changes. Vascular/Lymphatic: No significant vascular findings are present. No enlarged abdominal or pelvic lymph nodes. Reproductive: Uterus and bilateral adnexa are unremarkable. Other: Fat containing umbilical hernia noted. Musculoskeletal: No acute or focal abnormality. Degenerative disease L4-5 and L5-S1 is identified. IMPRESSION: No acute abnormality or finding to explain the patient's symptoms. Hepatomegaly and fatty infiltration of the liver. Fat containing umbilical hernia. Lower lumbar degenerative change. Electronically Signed   By: Inge Rise M.D.   On: 03/28/2019 17:18    Procedures Procedures (including critical care time)  Medications Ordered in ED Medications  sodium chloride flush (NS) 0.9 % injection 3 mL (3 mLs Intravenous Given 03/28/19 1543)  iohexol (OMNIPAQUE) 300  MG/ML solution 100 mL (100 mLs Intravenous Contrast Given 03/28/19 1652)    ED Course  I have reviewed the triage vital signs and the nursing notes.  Pertinent labs & imaging results that were available during my care of the patient were reviewed by me and considered in my medical decision making (see chart for details).    MDM Rules/Calculators/A&P                      MDM  Pt counseled on constipation.  Pt advised miralax.  Ct and labs reviewed interpreted and discussed with pt  Final Clinical Impression(s) / ED Diagnoses Final diagnoses:  Generalized abdominal pain  Constipation, unspecified constipation type    Rx / DC Orders ED Discharge Orders         Ordered    polyethylene glycol (MIRALAX) 17 g packet  Daily     03/28/19 1737        An After Visit Summary was printed and given to the patient.    Fransico Meadow, [place] 03/28/19 1900    Fredia Sorrow, MD 04/01/19 [phone]

## 2019-03-28 NOTE — Discharge Instructions (Signed)
Return if any problems.

## 2019-03-30 ENCOUNTER — Telehealth: Payer: Self-pay | Admitting: *Deleted

## 2019-03-30 ENCOUNTER — Encounter: Payer: Medicaid Other | Attending: Physical Medicine & Rehabilitation | Admitting: Physical Medicine & Rehabilitation

## 2019-03-30 ENCOUNTER — Encounter: Payer: Self-pay | Admitting: Physical Medicine & Rehabilitation

## 2019-03-30 ENCOUNTER — Other Ambulatory Visit: Payer: Self-pay

## 2019-03-30 VITALS — BP 136/78 | HR 96 | Temp 97.5°F | Ht 69.0 in | Wt 290.0 lb

## 2019-03-30 DIAGNOSIS — M47816 Spondylosis without myelopathy or radiculopathy, lumbar region: Secondary | ICD-10-CM | POA: Insufficient documentation

## 2019-03-30 DIAGNOSIS — M48061 Spinal stenosis, lumbar region without neurogenic claudication: Secondary | ICD-10-CM | POA: Diagnosis present

## 2019-03-30 DIAGNOSIS — M533 Sacrococcygeal disorders, not elsewhere classified: Secondary | ICD-10-CM

## 2019-03-30 DIAGNOSIS — M1712 Unilateral primary osteoarthritis, left knee: Secondary | ICD-10-CM | POA: Insufficient documentation

## 2019-03-30 DIAGNOSIS — Z5181 Encounter for therapeutic drug level monitoring: Secondary | ICD-10-CM | POA: Insufficient documentation

## 2019-03-30 MED ORDER — DIAZEPAM 10 MG PO TABS: ORAL_TABLET | ORAL | 0 refills | Status: DC

## 2019-03-30 NOTE — Telephone Encounter (Addendum)
Pre procedure instructions given and reviewed for her upcoming LBB. She is not on any blood thinners.  She relates that she would like pre med, so 10 mg diazepam will be called to her pharmacy to be taken 30 min prior to procedure. She understands she must have a driver with her.

## 2019-03-30 NOTE — Progress Notes (Signed)
Subjective:    Patient ID: Margaret Owens, female    DOB: 01/13/1971, 48 y.o.   MRN: [id number]  HPI  48 year old female with morbid obesity and chronic low back pain.  She has pain that interferes with her mobility BIlateral Sacroiliac injections were helpful ~50% pain relief with increased standing tolerance and walking improved up to half a block pain relief lasted approximately 1 month Also helped with pain radiating to lateral thigh  Left knee injection July 2020, Celestone lasted ~3wks  Her back pain is more problematic than her knee pain.  Pain Inventory Average Pain 7 Pain Right Now 10 My pain is constant, sharp, tingling and throbbing  In the last 24 hours, has pain interfered with the following? General activity 10 Relation with others 10 Enjoyment of life 10 What TIME of day is your pain at its worst? daytime Sleep (in general) Poor  Pain is worse with: standing Pain improves with: medication, TENS and injections Relief from Meds: 2  Mobility walk with assistance use a cane ability to climb steps?  no do you drive?  yes  Function not employed: date last employed . disabled: date disabled .  Neuro/Psych bladder control problems bowel control problems weakness numbness tremor tingling trouble walking spasms dizziness depression anxiety  Prior Studies Any changes since last visit?  no  Physicians involved in your care Any changes since last visit?  no   Family History  Problem Relation Age of Onset  . Osteoarthritis Mother   . Breast cancer Maternal Grandmother        spread to lungs    Social History   Socioeconomic History  . Marital status: Single    Spouse name: Not on file  . Number of children: Not on file  . Years of education: Not on file  . Highest education level: Not on file  Occupational History  . Not on file  Tobacco Use  . Smoking status: Never Smoker  . Smokeless tobacco: Never Used  Substance and Sexual Activity   . Alcohol use: Yes    Comment: 1/week  . Drug use: No  . Sexual activity: Yes    Partners: Male    Birth control/protection: None  Other Topics Concern  . Not on file  Social History Narrative  . Not on file   Social Determinants of Health   Financial Resource Strain:   . Difficulty of Paying Living Expenses: Not on file  Food Insecurity:   . Worried About Charity fundraiser in the Last Year: Not on file  . Ran Out of Food in the Last Year: Not on file  Transportation Needs:   . Lack of Transportation (Medical): Not on file  . Lack of Transportation (Non-Medical): Not on file  Physical Activity:   . Days of Exercise per Week: Not on file  . Minutes of Exercise per Session: Not on file  Stress:   . Feeling of Stress : Not on file  Social Connections:   . Frequency of Communication with Friends and Family: Not on file  . Frequency of Social Gatherings with Friends and Family: Not on file  . Attends Religious Services: Not on file  . Active Member of Clubs or Organizations: Not on file  . Attends Archivist Meetings: Not on file  . Marital Status: Not on file   Past Surgical History:  Procedure Laterality Date  . colonoscopy     Past Medical History:  Diagnosis Date  . Anxiety   .  Arthritis   . Asthma   . Bursitis   . Depression   . GERD (gastroesophageal reflux disease)   . Hypertension   . Muscle spasms of both lower extremities   . PTSD (post-traumatic stress disorder)   . Sciatica   . Seizures ([place])   . Uterine fibroid    There were no vitals taken for this visit.  Opioid Risk Score:   Fall Risk Score:  `1  Depression screen PHQ 2/9  Depression screen [facility] 2/9 10/05/2018 07/04/2018 04/18/2018 01/14/2018 12/17/2017  Decreased Interest 2 3 3 3 3   Down, Depressed, Hopeless 2 3 3 3 3   PHQ - 2 Score 4 6 6 6 6   Altered sleeping 2 3 3 3 3   Tired, decreased energy 2 3 3 3 3   Change in appetite 2 3 3 3 3   Feeling bad or failure about yourself  2 3 3 3 3    Trouble concentrating 2 3 3 3 3   Moving slowly or fidgety/restless 1 2 1 3 3   Suicidal thoughts 2 3 3 3 3   PHQ-9 Score 17 26 25 27 27   Difficult doing work/chores Very difficult - - - -    Review of Systems  Constitutional: Negative.   HENT: Negative.   Eyes: Negative.   Respiratory: Positive for shortness of breath.   Gastrointestinal: Positive for constipation and nausea.  Endocrine: Negative.   Genitourinary: Positive for difficulty urinating.       Incontinence   Musculoskeletal: Positive for arthralgias and back pain.       Spasms   Skin: Negative.   Allergic/Immunologic: Negative.   Neurological: Positive for dizziness, tremors, weakness and numbness.       Tingling  Psychiatric/Behavioral: Positive for dysphoric mood. The patient is nervous/anxious.        Objective:   Physical Exam Vitals and nursing note reviewed.  Constitutional:      Appearance: She is obese.  HENT:     Head: Normocephalic and atraumatic.  Eyes:     Extraocular Movements: Extraocular movements intact.     Conjunctiva/sclera: Conjunctivae normal.     Pupils: Pupils are equal, round, and reactive to light.  Musculoskeletal:     Lumbar back: No deformity. Decreased range of motion.     Right knee: Normal.     Left knee: Swelling present. Decreased range of motion. Tenderness present over the medial joint line, lateral joint line and patellar tendon.     Comments: Left knee flexion to 95 degrees, extension is full Right knee extension is full Right knee flexion is to 120 degrees limited by body habitus  Neurological:     Mental Status: She is alert and oriented to person, place, and time.     Motor: No weakness.     Gait: Gait abnormal.     Comments: 5/5 strength bilateral hip flexor knee extensor ankle dorsiflexor Straight leg raising is negative bilaterally Sensation intact light touch bilateral lower limbs.  Psychiatric:        Mood and Affect: Mood normal.        Behavior: Behavior  normal.           Assessment & Plan:  1.  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  We discussed treatment options she has had good relief i.e. greater than 50% relief for 1 month after sacroiliac intra-articular corticosteroid injection with lidocaine. We discussed that repeat sacroiliac intra-articular injections will likely produce a similar effect.  We discussed interventions that may have  a longer duration of response.  Patient would be a good candidate for L5 dorsal ramus S1-S2-S3 lateral branch blocks on the right side.  If these produce 50% relief of his right sided low back and proximal thigh pain, he would be a good candidate for radiofrequency neurotomy of the same nerves  #2.  Left knee osteoarthritis radiographically staged as mild on 09/10/2018.  She does have intermittent synovitis.  She had a less than 1 month relief with corticosteroid injection.  We discussed other treatment options including a sustained-release corticosteroid injection i.e. Zilretta versus left knee genicular nerve blocks.  If these are greater than 50% effective in reducing her left knee pain, radiofrequency ablation of the same nerves may produce a 6 to 25-month relief .

## 2019-04-19 MED FILL — MELOXICAM 15 MG TABLET: 15 | 30 days supply | Qty: 30 | Fill #1

## 2019-04-19 MED FILL — PROAIR HFA 90 MCG INHALER: 108 (90 BAS | 25 days supply | Qty: 9 | Fill #1

## 2019-04-19 MED FILL — SYMBICORT 160-4.5 MCG INH: 160-4.5 | 30 days supply | Qty: 10 | Fill #0

## 2019-04-19 MED FILL — PANTOPRAZOLE SOD DR 40 MG T: 40 | 30 days supply | Qty: 60 | Fill #0

## 2019-04-25 ENCOUNTER — Other Ambulatory Visit: Payer: Self-pay

## 2019-04-25 ENCOUNTER — Encounter: Payer: Self-pay | Admitting: Physical Medicine & Rehabilitation

## 2019-04-25 ENCOUNTER — Encounter: Payer: Medicaid Other | Attending: Physical Medicine & Rehabilitation | Admitting: Physical Medicine & Rehabilitation

## 2019-04-25 VITALS — BP 116/79 | HR 79 | Temp 97.2°F | Ht 69.0 in | Wt 294.0 lb

## 2019-04-25 DIAGNOSIS — M48061 Spinal stenosis, lumbar region without neurogenic claudication: Secondary | ICD-10-CM | POA: Insufficient documentation

## 2019-04-25 DIAGNOSIS — M1712 Unilateral primary osteoarthritis, left knee: Secondary | ICD-10-CM | POA: Insufficient documentation

## 2019-04-25 DIAGNOSIS — M533 Sacrococcygeal disorders, not elsewhere classified: Secondary | ICD-10-CM

## 2019-04-25 DIAGNOSIS — M47816 Spondylosis without myelopathy or radiculopathy, lumbar region: Secondary | ICD-10-CM | POA: Insufficient documentation

## 2019-04-25 DIAGNOSIS — Z5181 Encounter for therapeutic drug level monitoring: Secondary | ICD-10-CM | POA: Diagnosis not present

## 2019-04-25 NOTE — Progress Notes (Signed)
  PROCEDURE RECORD [facility]   Name: Margaret Owens DOB:17-Jan-1971 MRN: [id number]  Date:04/25/2019  Physician: Alysia Penna, MD    Nurse/CMA: Truman Hayward, CMA  Allergies:  Allergies  Allergen Reactions  . Latex Hives  . Penicillins Hives    Has patient had a PCN reaction causing immediate rash, facial/tongue/throat swelling, SOB or lightheadedness with hypotension: No Has patient had a PCN reaction causing severe rash involving mucus membranes or skin necrosis: No Has patient had a PCN reaction that required hospitalization No Has patient had a PCN reaction occurring within the last 10 years: No If all of the above answers are "NO", then may proceed with Cephalosporin use. Has patient had a PCN reaction causing immediate rash, facial/tongue/throat swelling, SOB or lightheadedness with hypotension: No Has patient had a PCN reaction causing severe rash involving mucus membranes or skin necrosis: No Has patient had a PCN reaction that required hospitalization No Has patient had a PCN reaction occurring within the last 10 years: No If all of the above answers are "NO", then may proceed with Cephalosporin use.    Consent Signed: Yes.    Is patient diabetic? No.  CBG today?   Pregnant: No. LMP: No LMP recorded. (age 50-55)  Anticoagulants: no Anti-inflammatory: no Antibiotics: no  Procedure: right L5 dorsal ramus,  S1-3 lateral branch blocks Position: Prone Start Time: 1:56pm  End Time: 2:03pm  Fluoro Time: 37  RN/CMA Joshaua Epple, CMA Lee, CMA    Time 1:20pm 2:11pm    BP 117/69 152/83    Pulse 79 70    Respirations 14 14    O2 Sat 99 99    S/S 6 6    Pain Level 8/10 8/10     D/C home with friend, patient A & O X 3, D/C instructions reviewed, and sits independently.

## 2019-04-25 NOTE — Patient Instructions (Signed)
If you get at least 50% relief from this procedure on a short-term basis you would be a candidate for radiofrequency neurotomy of the same nerves.  Please refer to pamphlet

## 2019-04-25 NOTE — Progress Notes (Signed)
L5 dorsal ramus S1-S2-S3 lateral branch blocks under fluoroscopic guidance °RIGHT  side ° °Informed consent was obtained after describing risks and benefits of the procedure with patient these include bleeding bruising and infection.  He elects to proceed and has given written consent.  Patient placed prone on fluoroscopy table Betadine prep sterile drape a 25-gauge 1.5 inch needle was used to anesthetize skin and subcu tissue with 1% lidocaine 1 cc into each of 4 sites.  Then a 22-gauge 3.5" needle was inserted under fluoroscopic guidance for starting the S1 SAP sacral ala junction.  Bone contact made.  Isovue 200 x 0.5 mL demonstrated no intravascular uptake then 0.5 mL of 2% lidocaine was injected.  Then the lateral aspect of the S1, S2, S3 foramen was targeted.  Bone contact made out, Isovue-200 times 0.5 mL demonstrated no nerve root or intravascular uptake within 0.5 mL of 2% lidocaine solution was injected after negative drawback for blood.  Patient tolerated procedure well.  Postinjection instructions given. °

## 2019-05-18 ENCOUNTER — Other Ambulatory Visit: Payer: Self-pay | Admitting: Internal Medicine

## 2019-05-18 DIAGNOSIS — Z1231 Encounter for screening mammogram for malignant neoplasm of breast: Secondary | ICD-10-CM

## 2019-05-23 ENCOUNTER — Encounter: Payer: Medicaid Other | Attending: Physical Medicine & Rehabilitation | Admitting: Physical Medicine & Rehabilitation

## 2019-05-23 DIAGNOSIS — M47816 Spondylosis without myelopathy or radiculopathy, lumbar region: Secondary | ICD-10-CM | POA: Insufficient documentation

## 2019-05-23 DIAGNOSIS — Z5181 Encounter for therapeutic drug level monitoring: Secondary | ICD-10-CM | POA: Insufficient documentation

## 2019-05-23 DIAGNOSIS — M48061 Spinal stenosis, lumbar region without neurogenic claudication: Secondary | ICD-10-CM | POA: Insufficient documentation

## 2019-05-23 DIAGNOSIS — M1712 Unilateral primary osteoarthritis, left knee: Secondary | ICD-10-CM | POA: Insufficient documentation

## 2019-06-02 ENCOUNTER — Other Ambulatory Visit: Payer: Self-pay

## 2019-06-02 ENCOUNTER — Encounter: Payer: Self-pay | Admitting: Physical Medicine & Rehabilitation

## 2019-06-02 ENCOUNTER — Encounter: Payer: Medicaid Other | Admitting: Physical Medicine & Rehabilitation

## 2019-06-02 VITALS — BP 139/84 | HR 88 | Temp 97.7°F | Ht 69.0 in | Wt 293.0 lb

## 2019-06-02 DIAGNOSIS — Z5181 Encounter for therapeutic drug level monitoring: Secondary | ICD-10-CM | POA: Diagnosis not present

## 2019-06-02 DIAGNOSIS — M533 Sacrococcygeal disorders, not elsewhere classified: Secondary | ICD-10-CM

## 2019-06-02 DIAGNOSIS — M1712 Unilateral primary osteoarthritis, left knee: Secondary | ICD-10-CM

## 2019-06-02 DIAGNOSIS — M47816 Spondylosis without myelopathy or radiculopathy, lumbar region: Secondary | ICD-10-CM | POA: Diagnosis present

## 2019-06-02 DIAGNOSIS — M48061 Spinal stenosis, lumbar region without neurogenic claudication: Secondary | ICD-10-CM | POA: Diagnosis present
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
## 2019-06-02 NOTE — Patient Instructions (Signed)
Knee Injection A knee injection is a procedure to get medicine into your knee joint to relieve the pain, swelling, and stiffness of arthritis. Your health care provider uses a needle to inject medicine, which may also help to lubricate and cushion your knee joint. You may need more than one injection. Tell a health care provider about:  Any allergies you have.  All medicines you are taking, including vitamins, herbs, eye drops, creams, and over-the-counter medicines.  Any problems you or family members have had with anesthetic medicines.  Any blood disorders you have.  Any surgeries you have had.  Any medical conditions you have.  Whether you are pregnant or may be pregnant. What are the risks? Generally, this is a safe procedure. However, problems may occur, including:  Infection.  Bleeding.  Symptoms that get worse.  Damage to the area around your knee.  Allergic reaction to any of the medicines.  Skin reactions from repeated injections. What happens before the procedure?  Ask your health care provider about changing or stopping your regular medicines. This is especially important if you are taking diabetes medicines or blood thinners.  Plan to have someone take you home from the hospital or clinic. What happens during the procedure?   You will sit or lie down in a position for your knee to be treated.  The skin over your kneecap will be cleaned with a germ-killing soap.  You will be given a medicine that numbs the area (local anesthetic). You may feel some stinging.  The medicine will be injected into your knee. The needle is carefully placed between your kneecap and your knee. The medicine is injected into the joint space.  The needle will be removed at the end of the procedure.  A bandage (dressing) may be placed over the injection site. The procedure may vary among health care providers and hospitals. What can I expect after the procedure?  Your blood  pressure, heart rate, breathing rate, and blood oxygen level will be monitored until you leave the hospital or clinic.  You may have to move your knee through its full range of motion. This helps to get all the medicine into your joint space.  You will be watched to make sure that you do not have a reaction to the injected medicine.  You may feel more pain, swelling, and warmth than you did before the injection. This reaction may last about 1-2 days. Follow these instructions at home: Medicines  Take over-the-counter and prescription medicines only as told by your doctor.  Do not drive or use heavy machinery while taking prescription pain medicine.  Do not take medicines such as aspirin and ibuprofen unless your health care provider tells you to take them. Injection site care  Follow instructions from your health care provider about: ? How to take care of your puncture site. ? When and how you should change your dressing. ? When you should remove your dressing.  Check your injection area every day for signs of infection. Check for: ? More redness, swelling, or pain after 2 days. ? Fluid or blood. ? Pus or a bad smell. ? Warmth. Managing pain, stiffness, and swelling   If directed, put ice on the injection area: ? Put ice in a plastic bag. ? Place a towel between your skin and the bag. ? Leave the ice on for 20 minutes, 2-3 times per day.  Do not apply heat to your knee.  Raise (elevate) the injection area above the level   of your heart while you are sitting or lying down. General instructions  If you were given a dressing, keep it dry until your health care provider says it can be removed. Ask your health care provider when you can start showering or taking a bath.  Avoid strenuous activities for as long as directed by your health care provider. Ask your health care provider when you can return to your normal activities.  Keep all follow-up visits as told by your health  care provider. This is important. You may need more injections. Contact a health care provider if you have:  A fever.  Warmth in your injection area.  Fluid, blood, or pus coming from your injection site.  Symptoms at your injection site that last longer than 2 days after your procedure. Get help right away if:  Your knee: ? Turns very red. ? Becomes very swollen. ? Is in severe pain. Summary  A knee injection is a procedure to get medicine into your knee joint to relieve the pain, swelling, and stiffness of arthritis.  A needle is carefully placed between your kneecap and your knee to inject medicine into the joint space.  Before the procedure, ask your health care provider about changing or stopping your regular medicines, especially if you are taking diabetes medicines or blood thinners.  Contact your health care provider if you have any problems or questions after your procedure. This information is not intended to replace advice given to you by your health care provider. Make sure you discuss any questions you have with your health care provider. Document Revised: 04/19/2017 Document Reviewed: 04/19/2017 Elsevier Patient Education  2020 [employer]  

## 2019-06-21 ENCOUNTER — Telehealth: Payer: Self-pay

## 2019-06-21 MED ORDER — DIAZEPAM 10 MG PO TABS: ORAL_TABLET | ORAL | 0 refills | Status: DC

## 2019-06-21 NOTE — Telephone Encounter (Signed)
#  1 called in for patient procedure. Called patient to remind her that she will need a driver.

## 2019-06-23 ENCOUNTER — Other Ambulatory Visit: Payer: Self-pay

## 2019-06-23 ENCOUNTER — Encounter: Payer: Medicaid Other | Attending: Physical Medicine & Rehabilitation | Admitting: Physical Medicine & Rehabilitation

## 2019-06-23 ENCOUNTER — Encounter: Payer: Self-pay | Admitting: Physical Medicine & Rehabilitation

## 2019-06-23 VITALS — BP 113/72 | HR 85 | Temp 97.9°F | Ht 69.0 in | Wt 284.0 lb

## 2019-06-23 DIAGNOSIS — M1712 Unilateral primary osteoarthritis, left knee: Secondary | ICD-10-CM | POA: Diagnosis present

## 2019-06-23 DIAGNOSIS — M47816 Spondylosis without myelopathy or radiculopathy, lumbar region: Secondary | ICD-10-CM | POA: Insufficient documentation

## 2019-06-23 DIAGNOSIS — M48061 Spinal stenosis, lumbar region without neurogenic claudication: Secondary | ICD-10-CM | POA: Diagnosis present

## 2019-06-23 DIAGNOSIS — M533 Sacrococcygeal disorders, not elsewhere classified: Secondary | ICD-10-CM

## 2019-06-23 DIAGNOSIS — Z5181 Encounter for therapeutic drug level monitoring: Secondary | ICD-10-CM | POA: Insufficient documentation

## 2019-06-23 NOTE — Progress Notes (Signed)
  PROCEDURE RECORD [facility]   Name: Margaret Owens DOB:Feb 19, 1971 MRN: [id number]  Date:06/23/2019  Physician: Alysia Penna, MD    Nurse/CMA: Bright, CMA  Allergies:  Allergies  Allergen Reactions  . Latex Hives  . Penicillins Hives    Has patient had a PCN reaction causing immediate rash, facial/tongue/throat swelling, SOB or lightheadedness with hypotension: No Has patient had a PCN reaction causing severe rash involving mucus membranes or skin necrosis: No Has patient had a PCN reaction that required hospitalization No Has patient had a PCN reaction occurring within the last 10 years: No If all of the above answers are "NO", then may proceed with Cephalosporin use. Has patient had a PCN reaction causing immediate rash, facial/tongue/throat swelling, SOB or lightheadedness with hypotension: No Has patient had a PCN reaction causing severe rash involving mucus membranes or skin necrosis: No Has patient had a PCN reaction that required hospitalization No Has patient had a PCN reaction occurring within the last 10 years: No If all of the above answers are "NO", then may proceed with Cephalosporin use.    Consent Signed: Yes.    Is patient diabetic? No.  CBG today? NA  Pregnant: No. LMP: No LMP recorded. (age 43-55)  Anticoagulants: no Anti-inflammatory: no Antibiotics: no  Procedure: right L5 dorsal ramus, S1-3 radiofrequency neurotomy Position: Prone   Start Time: 1239pm  End Time: 103pm Fluoro Time:  56s  RN/CMA Cadee Agro, CMA Bright, CMA    Time 12:16PM 111pm    BP 113/72 136/79    Pulse 86 84    Respirations 14 14    O2 Sat 98 99    S/S 6 6    Pain Level 6/10 8/10     D/C home with friend, patient A & O X 3, D/C instructions reviewed, and sits independently.

## 2019-06-23 NOTE — Progress Notes (Signed)
Sacroiliac radio frequency ablation under fluoroscopic guidance This consists of L5 dorsal ramus radio frequency ablation plus neuro lysis of S1-S2 -S3 lateral branches  Indication is sacroiliac pain which has improved temporarily onby at least 50% following sacroiliac intra-articular injection and L5 , S1,2,3 Lateral branch blocks under fluoroscopic guidance. Pain interferes with self-care and mobility and has failed to respond to conservative measures.  Informed consent was obtained after discussing risks and benefits of the procedure with the patient these include bleeding bruising and infection temporary or permanent paralysis. The patient elects to proceed and has given written consent.  Patient placed prone on fluoroscopy table. Area marked and prepped with Betadine. Fluoroscopic images utilized to guide needle. 25-gauge 1.5 inch needle was used to anesthetize 4 injection points with 2 cc of 1% lidocaine each. Then a 18-gauge 10 cm RF needle with a 10 mm curved active tip was inserted under fluoroscopic guidance first targeting the S1 SA P./sacral ala junction, bone contact made and confirmed with lateral imaging.  motor stimulation at 2 Hz confirm proper needle location followed by injection of one cc of a solution containing   1% lidocaine MPF. Radio frequency 80C for 80 seconds was performed. Then the inferolateral aspect of the S1, S2 and lateral aspect of S3 sacral foramina were targeted. Bone contact made.  motor stim at 2 Hz confirm proper needle location. One ML of the /lidocaine solution was injected into each of 3 sites and radio frequency ablation 80C for 90 seconds was performed. Patient tolerated procedure well. Post procedure instructions given 

## 2019-06-26 ENCOUNTER — Ambulatory Visit
Admission: RE | Admit: 2019-06-26 | Discharge: 2019-06-26 | Disposition: A | Discharge status: Home / Self Care | Payer: Medicaid Other | Source: Ambulatory Visit | Attending: Internal Medicine | Admitting: Internal Medicine

## 2019-06-26 ENCOUNTER — Other Ambulatory Visit: Payer: Self-pay

## 2019-06-26 DIAGNOSIS — Z1231 Encounter for screening mammogram for malignant neoplasm of breast: Secondary | ICD-10-CM

## 2019-07-20 ENCOUNTER — Other Ambulatory Visit (HOSPITAL_COMMUNITY): Payer: Self-pay | Admitting: Internal Medicine

## 2019-08-15 ENCOUNTER — Encounter (HOSPITAL_BASED_OUTPATIENT_CLINIC_OR_DEPARTMENT_OTHER): Payer: Self-pay | Admitting: *Deleted

## 2019-08-15 ENCOUNTER — Other Ambulatory Visit: Payer: Self-pay

## 2019-08-15 ENCOUNTER — Emergency Department (HOSPITAL_BASED_OUTPATIENT_CLINIC_OR_DEPARTMENT_OTHER)
Admission: EM | Admit: 2019-08-15 | Discharge: 2019-08-15 | Disposition: A | Discharge status: Home / Self Care | Payer: Medicaid Other | Attending: Emergency Medicine | Admitting: Emergency Medicine

## 2019-08-15 ENCOUNTER — Emergency Department (HOSPITAL_BASED_OUTPATIENT_CLINIC_OR_DEPARTMENT_OTHER): Payer: Medicaid Other

## 2019-08-15 DIAGNOSIS — I10 Essential (primary) hypertension: Secondary | ICD-10-CM | POA: Insufficient documentation

## 2019-08-15 DIAGNOSIS — Z79899 Other long term (current) drug therapy: Secondary | ICD-10-CM | POA: Diagnosis not present

## 2019-08-15 DIAGNOSIS — J45909 Unspecified asthma, uncomplicated: Secondary | ICD-10-CM | POA: Diagnosis not present

## 2019-08-15 DIAGNOSIS — Z91040 Latex allergy status: Secondary | ICD-10-CM | POA: Diagnosis not present

## 2019-08-15 DIAGNOSIS — R202 Paresthesia of skin: Secondary | ICD-10-CM | POA: Diagnosis not present

## 2019-08-15 DIAGNOSIS — M25562 Pain in left knee: Secondary | ICD-10-CM | POA: Insufficient documentation

## 2019-08-15 DIAGNOSIS — M545 Low back pain: Secondary | ICD-10-CM | POA: Diagnosis not present

## 2019-08-15 LAB — PREGNANCY, URINE: Preg Test, Ur: NEGATIVE

## 2019-08-15 MED ORDER — METHOCARBAMOL 500 MG PO TABS
750.0000 mg | ORAL_TABLET | Freq: Once | ORAL | Status: AC
  Administered 2019-08-15: 750 mg via ORAL
  Filled 2019-08-15: qty 2

## 2019-08-15 MED ORDER — KETOROLAC TROMETHAMINE 60 MG/2ML IM SOLN
60.0000 mg | Freq: Once | INTRAMUSCULAR | Status: AC
  Administered 2019-08-15: 60 mg via INTRAMUSCULAR
  Filled 2019-08-15: qty 2

## 2019-08-15 MED ORDER — METHOCARBAMOL 500 MG PO TABS: 500.0000 mg | ORAL_TABLET | Freq: Two times a day (BID) | ORAL | 0 refills | Status: DC

## 2019-08-15 MED ORDER — NAPROXEN 500 MG PO TABS: 500.0000 mg | ORAL_TABLET | Freq: Two times a day (BID) | ORAL | 0 refills | Status: AC

## 2019-08-15 MED FILL — METHOCARBAMOL 500 MG TABS: 500 | 10 days supply | Qty: 20 | Fill #0

## 2019-08-15 MED FILL — NAPROXEN 500 MG TABS: 500 | 15 days supply | Qty: 30 | Fill #0

## 2019-08-15 NOTE — ED Provider Notes (Signed)
[facility] EMERGENCY DEPARTMENT Provider Note   CSN: [id number] Arrival date & time: 08/15/19  1207     History No chief complaint on file.   Margaret Owens is a 49 y.o. female.  HPI 49 year-old female history of fibroids, left knee osteoarthritis presents to the ER with a history of low back and left knee pain.  Patient states that she normally takes meloxicam which controls her pain but over the last few days since medication has been effective.  Also tried Voltaren gel and TENS unit with no relief.  Endorses bilateral numbness down her lateral thigh bilateral tingling.  Relieved with sitting down.  States that she normally walks with her pain on her left knee but due to the pain has not been able to ambulate very well over the last few days.  She notes swelling to her left knee.  Denies trauma/fall/wrist pain.  No groin numbness, bowel or bladder incontinence, foot drop, night sweats, dizziness, lightheadedness, syncope, chest pain, shortness of breath chills, fever, IVDU, unintended weight loss, cancer history.  She does not have history of gout.  She follows with Dr. Letta Pate with orthopedics. Past Medical History:  Diagnosis Date  . Anxiety   . Arthritis   . Asthma   . Bursitis   . Depression   . GERD (gastroesophageal reflux disease)   . Hypertension   . Muscle spasms of both lower extremities   . PTSD (post-traumatic stress disorder)   . Sciatica   . Seizures ([place])   . Uterine fibroid     Patient Active Problem List   Diagnosis Date Noted  . Primary osteoarthritis of left knee 07/04/2018  . Uterine fibroid 06/09/2017  . 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 colonoscopy       OB History    Gravida  3   Para  3   Term  3   Preterm      AB      Living  3     SAB  0   TAB  0   Ectopic  0   Multiple  0   Live Births  3           Family History  Problem Relation Age of Onset   . Osteoarthritis Mother   . Breast cancer Maternal Grandmother        spread to lungs     Social History   Tobacco Use  . Smoking status: Never Smoker  . Smokeless tobacco: Never Used  Substance Use Topics  . Alcohol use: Yes    Comment: 1/week  . Drug use: No    Home Medications Prior to Admission medications   Medication Sig Start Date End Date Taking? Authorizing Provider  albuterol (VENTOLIN HFA) 108 (90 Base) MCG/ACT inhaler Inhale 2 puffs into the lungs every 6 (six) hours as needed for wheezing (wheezing). 10/05/18  Yes Fulp, Cammie, MD  budesonide-formoterol (SYMBICORT) 160-4.5 MCG/ACT inhaler 2 puffs twice daily to control asthma 10/05/18  Yes Fulp, Cammie, MD  cetirizine (ZYRTEC) 10 MG tablet Take 1 tablet (10 mg total) by mouth daily. 10/05/18  Yes Fulp, Cammie, MD  clotrimazole (LOTRIMIN) 1 % cream Apply to affected area 2 times daily 10/09/17  Yes Upstill, Shari, PA-C  cyclobenzaprine (FLEXERIL) 5 MG tablet Take 1 tablet (5 mg total) by mouth 3 (three) times daily as needed for muscle spasms. 10/05/18  Yes Fulp, Cammie, MD  desonide (  DESOWEN) 0.05 % cream Apply 1 application topically 2 (two) times daily.   Yes [provider]  diazepam (VALIUM) 10 MG tablet Take one tablet po 30 minutes prior to procedure. Must have a driver. 06/21/19  Yes Kirsteins, Luanna Salk, MD  diclofenac sodium (VOLTAREN) 1 % GEL Apply 4 grams every 6 hours (or 4 times per day) to the knees if needed for pain 09/10/18  Yes Drenda Freeze, MD  DULoxetine (CYMBALTA) 30 MG capsule Take 30 mg by mouth 2 (two) times daily.   Yes [provider]  fluticasone (FLONASE) 50 MCG/ACT nasal spray Place 2 sprays into both nostrils daily. 12/17/17  Yes Fulp, Cammie, MD  gabapentin (NEURONTIN) 300 MG capsule Take 2 pills twice per day and 2 pills at bedtime for nerve pain 10/05/18  Yes Fulp, Cammie, MD  meloxicam (MOBIC) 15 MG tablet Take 1 tablet (15 mg total) by mouth daily. As needed for pain. Take  after eating 10/05/18  Yes Fulp, Cammie, MD  pantoprazole (PROTONIX) 40 MG tablet Take 1 tablet (40 mg total) by mouth 2 (two) times daily. 10/05/18  Yes Fulp, Cammie, MD  phentermine 37.5 MG capsule Take 37.5 mg by mouth every morning.   Yes [provider]  polyethylene glycol (MIRALAX) 17 g packet Take 17 g by mouth daily. 03/28/19  Yes Caryl Ada K, PA-C  tiZANidine (ZANAFLEX) 4 MG tablet Take 1 tablet (4 mg total) by mouth at bedtime. As needed for muscle spasm 04/18/18  Yes Fulp, Cammie, MD  traZODone (DESYREL) 50 MG tablet Take 50 mg by mouth at bedtime as needed for sleep (sleep).    Yes [provider]  methocarbamol (ROBAXIN) 500 MG tablet Take 1 tablet (500 mg total) by mouth 2 (two) times daily. 08/15/19   Garald Balding, PA-C  naproxen (NAPROSYN) 500 MG tablet Take 1 tablet (500 mg total) by mouth 2 (two) times daily. 08/15/19   Garald Balding, PA-C    Allergies    Latex and Penicillins  Review of Systems   Review of Systems  Constitutional: Negative for fever.  Musculoskeletal: Positive for arthralgias, back pain and joint swelling. Negative for myalgias, neck pain and neck stiffness.  Neurological: Negative for seizures, syncope, weakness and light-headedness.  All other systems reviewed and are negative.   Physical Exam Updated Vital Signs BP 128/66   Pulse 72   Temp 98.2 F (36.8 C) (Oral)   Resp 20   Ht 5\' 9"  (1.753 m)   Wt 128.8 kg   LMP 08/12/2019   SpO2 100%   BMI 41.93 kg/m   Physical Exam Vitals and nursing note reviewed.  Constitutional:      General: She is not in acute distress.    Appearance: She is well-developed.  HENT:     Head: Normocephalic and atraumatic.     Nose: Nose normal.     Mouth/Throat:     Mouth: Mucous membranes are moist.     Pharynx: Oropharynx is clear.  Eyes:     Extraocular Movements: Extraocular movements intact.     Conjunctiva/sclera: Conjunctivae normal.     Pupils: Pupils are equal, round, and  reactive to light.  Cardiovascular:     Rate and Rhythm: Normal rate and regular rhythm.     Pulses: Normal pulses.     Heart sounds: Normal heart sounds. No murmur.  Pulmonary:     Effort: Pulmonary effort is normal. No respiratory distress.     Breath sounds: Normal breath  sounds.  Abdominal:     General: Abdomen is flat.     Palpations: Abdomen is soft.     Tenderness: There is no abdominal tenderness.  Musculoskeletal:        General: Swelling present. No tenderness.     Cervical back: Normal range of motion and neck supple. No tenderness.     Right lower leg: No edema.     Left lower leg: No edema.     Comments: Midline tenderness to palpation over T-spine and L-spine.  There is no crepitus, step-offs, rashes, wounds.  She has some paraspinal muscle tenderness in L-spine.  Left knee with mild effusion , range of motion intact but with pain.  Left knee joint not warm or erythematous.  There is no overt tenderness to palpation to the joint. Sensations in lower and upper extremity intact.  Patient is able to move all 4 limbs  Skin:    General: Skin is warm and dry.     Findings: No bruising, erythema or rash.  Neurological:     General: No focal deficit present.     Mental Status: She is alert and oriented to person, place, and time.     Cranial Nerves: No cranial nerve deficit.     Sensory: No sensory deficit.     Motor: No weakness.     Coordination: Coordination normal.  Psychiatric:        Mood and Affect: Mood normal.        Behavior: Behavior normal.     ED Results / Procedures / Treatments   Labs (all labs ordered are listed, but only abnormal results are displayed) Labs Reviewed  PREGNANCY, URINE    EKG None  Radiology DG Thoracic Spine 2 View  Result Date: 08/15/2019 CLINICAL DATA:  Low back pain EXAM: THORACIC SPINE 2 VIEWS COMPARISON:  None. FINDINGS: There is no evidence of thoracic spine fracture. Alignment is normal. No other significant bone  abnormalities are identified. IMPRESSION: Negative. Electronically Signed   By: Constance Holster M.D.   On: 08/15/2019 16:29   DG Lumbar Spine Complete  Result Date: 08/15/2019 CLINICAL DATA:  Pain EXAM: LUMBAR SPINE - COMPLETE 4+ VIEW COMPARISON:  None. FINDINGS: There is no acute displaced fracture or dislocation. Advanced degenerative changes are noted at the L4-L5 and L5-S1 levels. There is a degenerative grade 1 anterolisthesis of L4 on L5. Facet arthrosis is noted at the lower lumbar segments. IMPRESSION: 1. No acute displaced fracture or dislocation. 2. Advanced degenerative changes at L4-L5 and L5-S1. Electronically Signed   By: Constance Holster M.D.   On: 08/15/2019 16:25   DG Knee Complete 4 Views Left  Result Date: 08/15/2019 CLINICAL DATA:  Left knee pain. EXAM: LEFT KNEE - COMPLETE 4+ VIEW COMPARISON:  Sep 10, 2018 FINDINGS: Moderate tricompartmental degenerative changes are noted, greatest within the lateral and patellofemoral compartments. There is no acute displaced fracture. No dislocation. There is a moderate-sized joint effusion which has decreased in size from prior study. IMPRESSION: 1. Moderate tricompartmental degenerative changes. No acute bony abnormality. 2. Moderate joint effusion, decreased from prior study. Electronically Signed   By: Constance Holster M.D.   On: 08/15/2019 16:28    Procedures Procedures (including critical care time)  Medications Ordered in ED Medications  methocarbamol (ROBAXIN) tablet 750 mg (750 mg Oral Given 08/15/19 1440)  ketorolac (TORADOL) injection 60 mg (60 mg Intramuscular Given 08/15/19 1440)    ED Course  I have reviewed the triage vital signs and the  nursing notes.  Pertinent labs & imaging results that were available during my care of the patient were reviewed by me and considered in my medical decision making (see chart for details).    MDM Rules/Calculators/A&P                     49 year old female with left knee pain and low  back pain x3 days. On presentation to the ER, patient in no acute distress, nontoxic-appearing, speaking in full sentences, resting comfortably in the ER bed.  Vitals overall reassuring.  Physical exam positive for midline tenderness to the T-spine and L-spine, normal range of motion and sensation.  Left knee with questionable mild effusion, no overlying erythema.  Doubt gout, cellulitis, septic joint, cellulitis as the joint is not warm, erythematous, tender to palpation, with normal range of motion.  Will obtain imaging of L-spine T-spine and left knee.  Treat with Toradol and Robaxin.  Imaging negative for acute fractures, disc herniations.  L-spine x-rays showed severe degenerative changes at L4-5, L5-S1.  Left knee x-ray with chronic degenerative changes, decreased effusion from previous study.  T-spine x-ray negative.  On reevaluation, patient notes improvement in tenderness and pain to low back and knee.  She is overall reassured by the work-up.   Normal neurological exam, no evidence of urinary incontinence or retention, pain is consistently reproducible. There is no evidence of AAA or concern for dissection at this time.   Patient can walk but states is painful.  No loss of bowel or bladder control.  No concern for cauda equina.  No fever, night sweats, weight loss, h/o cancer, IVDU.    I stressed follow-up with orthopedics and patient voices understanding and agrees.  I have just prescribed her some naproxen to see if this improves her symptoms.  She was instructed to not take meloxicam and naproxen together.  I also prescribed her some Robaxin.  Return precautions given.  At this stage in the ED course, the patient has been medically screened and is stable for discharge.  Final Clinical Impression(s) / ED Diagnoses Final diagnoses:  None    Rx / DC Orders ED Discharge Orders         Ordered    naproxen (NAPROSYN) 500 MG tablet  2 times daily     08/15/19 1640    methocarbamol (ROBAXIN)  500 MG tablet  2 times daily     08/15/19 1642           Lyndel Safe 08/15/19 1657    Gareth Morgan, MD 08/16/19 [id number]

## 2019-08-15 NOTE — ED Triage Notes (Signed)
Lower back pain and left knee swelling with pain.

## 2019-08-15 NOTE — Discharge Instructions (Signed)
Your x-rays did not show any evidence of new fractures, disc bulges, or any other acute causes of your pain.  It did show a joint effusion which is a swelling of the joint, but this has gone down from previous visits.  I do not think you have any life-threatening causes of your pain.  I have just prescribed another anti-inflammatory medication called naproxen.  You can try this to see if it help manages your pain better.  Do not take this together with your meloxicam.  Take 1 or the other.  I have also prescribed a muscle relaxer called Robaxin.  Please take this medication at night as it can make you sleepy.  Do not drink or drive on this medication.  Please make sure to follow-up with Dr. Letta Pate office, please call them tomorrow to make an appointment.  Return to the ER if your symptoms worsen.

## 2019-08-15 NOTE — ED Notes (Signed)
EDP at bedside  

## 2019-09-07 ENCOUNTER — Other Ambulatory Visit (HOSPITAL_COMMUNITY): Payer: Self-pay | Admitting: Internal Medicine

## 2019-09-22 ENCOUNTER — Other Ambulatory Visit: Payer: Self-pay

## 2019-09-22 ENCOUNTER — Encounter: Payer: Self-pay | Admitting: Physical Medicine & Rehabilitation

## 2019-09-22 ENCOUNTER — Encounter: Payer: Medicaid Other | Attending: Physical Medicine & Rehabilitation | Admitting: Physical Medicine & Rehabilitation

## 2019-09-22 VITALS — BP 141/89 | HR 93 | Temp 97.5°F | Ht 69.0 in | Wt 267.0 lb

## 2019-09-22 DIAGNOSIS — Z5181 Encounter for therapeutic drug level monitoring: Secondary | ICD-10-CM | POA: Insufficient documentation

## 2019-09-22 DIAGNOSIS — M47816 Spondylosis without myelopathy or radiculopathy, lumbar region: Secondary | ICD-10-CM | POA: Insufficient documentation

## 2019-09-22 DIAGNOSIS — M48061 Spinal stenosis, lumbar region without neurogenic claudication: Secondary | ICD-10-CM | POA: Insufficient documentation

## 2019-09-22 DIAGNOSIS — M1712 Unilateral primary osteoarthritis, left knee: Secondary | ICD-10-CM | POA: Diagnosis not present

## 2019-09-22 NOTE — Patient Instructions (Signed)
Knee Injection A knee injection is a procedure to get medicine into your knee joint to relieve the pain, swelling, and stiffness of arthritis. Your health care provider uses a needle to inject medicine, which may also help to lubricate and cushion your knee joint. You may need more than one injection. Tell a health care provider about:  Any allergies you have.  All medicines you are taking, including vitamins, herbs, eye drops, creams, and over-the-counter medicines.  Any problems you or family members have had with anesthetic medicines.  Any blood disorders you have.  Any surgeries you have had.  Any medical conditions you have.  Whether you are pregnant or may be pregnant. What are the risks? Generally, this is a safe procedure. However, problems may occur, including:  Infection.  Bleeding.  Symptoms that get worse.  Damage to the area around your knee.  Allergic reaction to any of the medicines.  Skin reactions from repeated injections. What happens before the procedure?  Ask your health care provider about changing or stopping your regular medicines. This is especially important if you are taking diabetes medicines or blood thinners.  Plan to have someone take you home from the hospital or clinic. What happens during the procedure?   You will sit or lie down in a position for your knee to be treated.  The skin over your kneecap will be cleaned with a germ-killing soap.  You will be given a medicine that numbs the area (local anesthetic). You may feel some stinging.  The medicine will be injected into your knee. The needle is carefully placed between your kneecap and your knee. The medicine is injected into the joint space.  The needle will be removed at the end of the procedure.  A bandage (dressing) may be placed over the injection site. The procedure may vary among health care providers and hospitals. What can I expect after the procedure?  Your blood  pressure, heart rate, breathing rate, and blood oxygen level will be monitored until you leave the hospital or clinic.  You may have to move your knee through its full range of motion. This helps to get all the medicine into your joint space.  You will be watched to make sure that you do not have a reaction to the injected medicine.  You may feel more pain, swelling, and warmth than you did before the injection. This reaction may last about 1-2 days. Follow these instructions at home: Medicines  Take over-the-counter and prescription medicines only as told by your doctor.  Do not drive or use heavy machinery while taking prescription pain medicine.  Do not take medicines such as aspirin and ibuprofen unless your health care provider tells you to take them. Injection site care  Follow instructions from your health care provider about: ? How to take care of your puncture site. ? When and how you should change your dressing. ? When you should remove your dressing.  Check your injection area every day for signs of infection. Check for: ? More redness, swelling, or pain after 2 days. ? Fluid or blood. ? Pus or a bad smell. ? Warmth. Managing pain, stiffness, and swelling   If directed, put ice on the injection area: ? Put ice in a plastic bag. ? Place a towel between your skin and the bag. ? Leave the ice on for 20 minutes, 2-3 times per day.  Do not apply heat to your knee.  Raise (elevate) the injection area above the level   of your heart while you are sitting or lying down. General instructions  If you were given a dressing, keep it dry until your health care provider says it can be removed. Ask your health care provider when you can start showering or taking a bath.  Avoid strenuous activities for as long as directed by your health care provider. Ask your health care provider when you can return to your normal activities.  Keep all follow-up visits as told by your health  care provider. This is important. You may need more injections. Contact a health care provider if you have:  A fever.  Warmth in your injection area.  Fluid, blood, or pus coming from your injection site.  Symptoms at your injection site that last longer than 2 days after your procedure. Get help right away if:  Your knee: ? Turns very red. ? Becomes very swollen. ? Is in severe pain. Summary  A knee injection is a procedure to get medicine into your knee joint to relieve the pain, swelling, and stiffness of arthritis.  A needle is carefully placed between your kneecap and your knee to inject medicine into the joint space.  Before the procedure, ask your health care provider about changing or stopping your regular medicines, especially if you are taking diabetes medicines or blood thinners.  Contact your health care provider if you have any problems or questions after your procedure. This information is not intended to replace advice given to you by your health care provider. Make sure you discuss any questions you have with your health care provider. Document Revised: 04/19/2017 Document Reviewed: 04/19/2017 Elsevier Patient Education  2020 [employer]  

## 2019-09-22 NOTE — Progress Notes (Signed)
Knee injection Without ultrasound guidance)  Indication:LEFT Knee pain not relieved by medication management and other conservative care.  Informed consent was obtained after describing risks and benefits of the procedure with the patient, this includes bleeding, bruising, infection and medication side effects. The patient wishes to proceed and has given written consent. The patient was placed in a recumbent position. The medial aspect of the knee was marked and prepped with Betadine and alcohol. It was then entered with a 30-gauge -1/2 inch needle and 2 mL of 1% lidocaine was injected into the skin and subcutaneous tissue. Then another 21g 2" needle was inserted into the LEFT knee joint. After negative draw back for blood, a solution containing 5 ML of Zilretta (32mg controlled release triamcinolone) injected. The patient tolerated the procedure well. Post procedure instructions were given.    

## 2019-10-10 ENCOUNTER — Other Ambulatory Visit (HOSPITAL_COMMUNITY): Payer: Self-pay | Admitting: Internal Medicine

## 2019-10-13 ENCOUNTER — Other Ambulatory Visit: Payer: Self-pay | Admitting: Family Medicine

## 2019-10-13 DIAGNOSIS — K219 Gastro-esophageal reflux disease without esophagitis: Secondary | ICD-10-CM

## 2019-10-13 NOTE — Telephone Encounter (Signed)
Patient called and left message to return call to schedule office visit for further medication refills.  Last OV on 10/05/18

## 2019-10-13 NOTE — Telephone Encounter (Signed)
Requested medication (s) are due for refill today: yes  Requested medication (s) are on the active medication list: yes  Last refill:  10/05/18 #60 with 3 refills  Future visit scheduled: No. Attempted to call patient to schedule appt. LVM  Notes to clinic:  Please review for refill. Patient due for OV. Attempted to call to schedule but no answer.     Requested Prescriptions  Pending Prescriptions Disp Refills   pantoprazole (PROTONIX) 40 MG tablet [Pharmacy Med Name: PANTOPRAZOLE SOD DR 40 MG T 40 Tablet] 60 tablet 2    Sig: Take 1 tablet (40 mg total) by mouth 2 (two) times daily.      Gastroenterology: Proton Pump Inhibitors Failed - 10/13/2019  4:59 PM      Failed - Valid encounter within last 12 months    Recent Outpatient Visits           1 year ago Chronic pain of left knee   [facility] [facility], [place], MD   1 year ago Lower abdominal pain   [facility] [place], [place], MD   1 year ago Mild intermittent acute asthmatic bronchitis with acute exacerbation   [facility] [place], [place], MD   1 year ago Chronic bilateral low back pain with bilateral sciatica   [facility], [place], MD   1 year ago Chronic bilateral low back pain with bilateral sciatica   [facility] [place], [place], MD

## 2019-10-22 ENCOUNTER — Other Ambulatory Visit: Payer: Self-pay

## 2019-10-22 ENCOUNTER — Encounter (HOSPITAL_BASED_OUTPATIENT_CLINIC_OR_DEPARTMENT_OTHER): Payer: Self-pay | Admitting: *Deleted

## 2019-10-22 ENCOUNTER — Emergency Department (HOSPITAL_BASED_OUTPATIENT_CLINIC_OR_DEPARTMENT_OTHER)
Admission: EM | Admit: 2019-10-22 | Discharge: 2019-10-22 | Disposition: A | Discharge status: Home / Self Care | Payer: Medicaid Other | Attending: Emergency Medicine | Admitting: Emergency Medicine

## 2019-10-22 DIAGNOSIS — J45909 Unspecified asthma, uncomplicated: Secondary | ICD-10-CM | POA: Insufficient documentation

## 2019-10-22 DIAGNOSIS — I10 Essential (primary) hypertension: Secondary | ICD-10-CM | POA: Insufficient documentation

## 2019-10-22 DIAGNOSIS — Z91040 Latex allergy status: Secondary | ICD-10-CM | POA: Insufficient documentation

## 2019-10-22 DIAGNOSIS — Z7951 Long term (current) use of inhaled steroids: Secondary | ICD-10-CM | POA: Diagnosis not present

## 2019-10-22 DIAGNOSIS — R109 Unspecified abdominal pain: Secondary | ICD-10-CM | POA: Diagnosis present

## 2019-10-22 DIAGNOSIS — N3000 Acute cystitis without hematuria: Secondary | ICD-10-CM | POA: Diagnosis not present

## 2019-10-22 DIAGNOSIS — R1013 Epigastric pain: Secondary | ICD-10-CM | POA: Insufficient documentation

## 2019-10-22 LAB — URINALYSIS, ROUTINE W REFLEX MICROSCOPIC
Glucose, UA: 100 mg/dL — AB
Hgb urine dipstick: NEGATIVE
Ketones, ur: NEGATIVE mg/dL
Nitrite: NEGATIVE
Protein, ur: NEGATIVE mg/dL
Specific Gravity, Urine: 1.02 (ref 1.005–1.030)
pH: 7 (ref 5.0–8.0)

## 2019-10-22 LAB — COMPREHENSIVE METABOLIC PANEL
ALT: 11 U/L (ref 0–44)
AST: 15 U/L (ref 15–41)
Albumin: 3.8 g/dL (ref 3.5–5.0)
Alkaline Phosphatase: 74 U/L (ref 38–126)
Anion gap: 11 (ref 5–15)
BUN: 8 mg/dL (ref 6–20)
CO2: 26 mmol/L (ref 22–32)
Calcium: 9 mg/dL (ref 8.9–10.3)
Chloride: 103 mmol/L (ref 98–111)
Creatinine, Ser: 0.57 mg/dL (ref 0.44–1.00)
GFR calc Af Amer: >60 mL/min (ref >60)
GFR calc non Af Amer: >60 mL/min (ref >60)
Glucose, Bld: 120 mg/dL — ABNORMAL HIGH (ref 70–99)
Potassium: 3.7 mmol/L (ref 3.5–5.1)
Sodium: 140 mmol/L (ref 135–145)
Total Bilirubin: 0.3 mg/dL (ref 0.3–1.2)
Total Protein: 8.3 g/dL — ABNORMAL HIGH (ref 6.5–8.1)

## 2019-10-22 LAB — CBC WITH DIFFERENTIAL/PLATELET
Abs Immature Granulocytes: 0.03 10*3/uL (ref 0.00–0.07)
Basophils Absolute: 0 10*3/uL (ref 0.0–0.1)
Basophils Relative: 1 %
Eosinophils Absolute: 0.1 10*3/uL (ref 0.0–0.5)
Eosinophils Relative: 2 %
HCT: 38.9 % (ref 36.0–46.0)
Hemoglobin: 12 g/dL (ref 12.0–15.0)
Immature Granulocytes: 0 %
Lymphocytes Relative: 32 %
Lymphs Abs: 2.4 10*3/uL (ref 0.7–4.0)
MCH: 25.9 pg — ABNORMAL LOW (ref 26.0–34.0)
MCHC: 30.8 g/dL (ref 30.0–36.0)
MCV: 84 fL (ref 80.0–100.0)
Monocytes Absolute: 0.5 10*3/uL (ref 0.1–1.0)
Monocytes Relative: 7 %
Neutro Abs: 4.5 10*3/uL (ref 1.7–7.7)
Neutrophils Relative %: 58 %
Platelets: 424 10*3/uL — ABNORMAL HIGH (ref 150–400)
RBC: 4.63 MIL/uL (ref 3.87–5.11)
RDW: 15 % (ref 11.5–15.5)
WBC: 7.6 10*3/uL (ref 4.0–10.5)
nRBC: 0 % (ref 0.0–0.2)

## 2019-10-22 LAB — PREGNANCY, URINE: Preg Test, Ur: NEGATIVE

## 2019-10-22 LAB — URINALYSIS, MICROSCOPIC (REFLEX)

## 2019-10-22 LAB — LIPASE, BLOOD: Lipase: 19 U/L (ref 11–51)

## 2019-10-22 MED ORDER — KETOROLAC TROMETHAMINE 30 MG/ML IJ SOLN
15.0000 mg | Freq: Once | INTRAMUSCULAR | Status: AC
  Administered 2019-10-22: 15 mg via INTRAVENOUS
  Filled 2019-10-22: qty 1

## 2019-10-22 MED ORDER — SULFAMETHOXAZOLE-TRIMETHOPRIM 800-160 MG PO TABS: 1.0000 | ORAL_TABLET | Freq: Two times a day (BID) | ORAL | 0 refills | Status: AC

## 2019-10-22 MED ORDER — PHENAZOPYRIDINE HCL 200 MG PO TABS
200.0000 mg | ORAL_TABLET | Freq: Three times a day (TID) | ORAL | 0 refills | Status: DC
Start: 2019-10-22 — End: 2020-01-30

## 2019-10-22 MED ORDER — ONDANSETRON HCL 4 MG/2ML IJ SOLN
4.0000 mg | Freq: Once | INTRAMUSCULAR | Status: AC
  Administered 2019-10-22: 4 mg via INTRAVENOUS
  Filled 2019-10-22: qty 2

## 2019-10-22 NOTE — Discharge Instructions (Signed)
Take the antibiotics as prescribed. The Pyridium is to help with discomfort. Don't be concerned, it will make your urine turn orange in color but will go away once he stopped taking that medication. Follow-up with your doctor to be rechecked next week if your symptoms persist.

## 2019-10-22 NOTE — ED Provider Notes (Signed)
[facility] EMERGENCY DEPARTMENT Provider Note   CSN: [id number] Arrival date & time: 10/22/19  0850     History Chief Complaint  Patient presents with  . Abdominal Pain    Margaret Owens is a 49 y.o. female.  HPI   Patient presents to the ED for evaluation of cramping abdominal pain.  Patient states she has had the symptoms for couple of weeks now.  Initially she attributed to some of her stress she is having at home.  Patient did see her primary care doctor recently for the symptoms.  He recommended she have colon cancer screening so the plan was for her to do the Cologuard.  Patient has not been able to pick that up because of issues with her losing her driver's license and not being able to pick up her mail because of that.  Patient has started noticed that her urine is dark and foul-smelling recently.  She has not had any fevers or chills.  No dysuria.  She denies any vomiting or diarrhea.  Symptoms do not get worse with eating.  Patient has had trouble like this in the past.  She has not been diagnosed irritable bowel but her doctor has treated her for gastritis previously.  Past Medical History:  Diagnosis Date  . Anxiety   . Arthritis   . Asthma   . Bursitis   . Depression   . GERD (gastroesophageal reflux disease)   . Hypertension   . Muscle spasms of both lower extremities   . PTSD (post-traumatic stress disorder)   . Sciatica   . Seizures ([place])   . Uterine fibroid     Patient Active Problem List   Diagnosis Date Noted  . Primary osteoarthritis of left knee 07/04/2018  . Uterine fibroid 06/09/2017  . 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 colonoscopy       OB History    Gravida  3   Para  3   Term  3   Preterm      AB      Living  3     SAB  0   TAB  0   Ectopic  0   Multiple  0   Live Births  3           Family History  Problem Relation Age of Onset  .  Osteoarthritis Mother   . Breast cancer Maternal Grandmother        spread to lungs     Social History   Tobacco Use  . Smoking status: Never Smoker  . Smokeless tobacco: Never Used  Vaping Use  . Vaping Use: Never used  Substance Use Topics  . Alcohol use: Yes    Comment: 1/week  . Drug use: No    Home Medications Prior to Admission medications   Medication Sig Start Date End Date Taking? Authorizing Provider  albuterol (VENTOLIN HFA) 108 (90 Base) MCG/ACT inhaler Inhale 2 puffs into the lungs every 6 (six) hours as needed for wheezing (wheezing). 10/05/18  Yes Fulp, Cammie, MD  budesonide-formoterol (SYMBICORT) 160-4.5 MCG/ACT inhaler 2 puffs twice daily to control asthma 10/05/18  Yes Fulp, Cammie, MD  cetirizine (ZYRTEC) 10 MG tablet Take 1 tablet (10 mg total) by mouth daily. 10/05/18  Yes Fulp, Cammie, MD  desonide (DESOWEN) 0.05 % cream Apply 1 application topically 2 (two) times daily.   Yes [provider]  diclofenac sodium (VOLTAREN) 1 %  GEL Apply 4 grams every 6 hours (or 4 times per day) to the knees if needed for pain 09/10/18  Yes Drenda Freeze, MD  gabapentin (NEURONTIN) 300 MG capsule Take 2 pills twice per day and 2 pills at bedtime for nerve pain 10/05/18  Yes Fulp, Cammie, MD  meloxicam (MOBIC) 15 MG tablet Take 1 tablet (15 mg total) by mouth daily. As needed for pain. Take after eating 10/05/18  Yes Fulp, Cammie, MD  methocarbamol (ROBAXIN) 500 MG tablet Take 1 tablet (500 mg total) by mouth 2 (two) times daily. 08/15/19  Yes Garald Balding, PA-C  naproxen (NAPROSYN) 500 MG tablet Take 1 tablet (500 mg total) by mouth 2 (two) times daily. 08/15/19  Yes Garald Balding, PA-C  pantoprazole (PROTONIX) 40 MG tablet Take 1 tablet (40 mg total) by mouth 2 (two) times daily. 10/05/18  Yes Fulp, Cammie, MD  traZODone (DESYREL) 50 MG tablet Take 50 mg by mouth at bedtime as needed for sleep (sleep).    Yes [provider]  cyclobenzaprine (FLEXERIL) 5 MG  tablet Take 1 tablet (5 mg total) by mouth 3 (three) times daily as needed for muscle spasms. 10/05/18   Fulp, Cammie, MD  DULoxetine (CYMBALTA) 30 MG capsule Take 30 mg by mouth 2 (two) times daily.    [provider]  fluticasone (FLONASE) 50 MCG/ACT nasal spray Place 2 sprays into both nostrils daily. 12/17/17   Fulp, Cammie, MD  phenazopyridine (PYRIDIUM) 200 MG tablet Take 1 tablet (200 mg total) by mouth 3 (three) times daily. 10/22/19   Dorie Rank, MD  phentermine 37.5 MG capsule Take 37.5 mg by mouth every morning.    [provider]  polyethylene glycol (MIRALAX) 17 g packet Take 17 g by mouth daily. 03/28/19   Fransico Meadow, PA-C  sulfamethoxazole-trimethoprim (BACTRIM DS) 800-160 MG tablet Take 1 tablet by mouth 2 (two) times daily for 5 days. 10/22/19 10/27/19  Dorie Rank, MD    Allergies    Latex and Penicillins  Review of Systems   Review of Systems  All other systems reviewed and are negative.   Physical Exam Updated Vital Signs BP (!) 123/52 (BP Location: Right Arm)   Pulse 92   Temp 98.4 F (36.9 C) (Oral)   Resp 20   Ht 1.753 m (5\' 9" )   Wt 117.9 kg   LMP 10/14/2019   SpO2 100%   BMI 38.40 kg/m   Physical Exam Vitals and nursing note reviewed.  Constitutional:      General: She is not in acute distress.    Appearance: She is well-developed.  HENT:     Head: Normocephalic and atraumatic.     Right Ear: External ear normal.     Left Ear: External ear normal.  Eyes:     General: No scleral icterus.       Right eye: No discharge.        Left eye: No discharge.     Conjunctiva/sclera: Conjunctivae normal.  Neck:     Trachea: No tracheal deviation.  Cardiovascular:     Rate and Rhythm: Normal rate and regular rhythm.  Pulmonary:     Effort: Pulmonary effort is normal. No respiratory distress.     Breath sounds: Normal breath sounds. No stridor. No wheezing or rales.  Abdominal:     General: Bowel sounds are normal. There is no distension.      Palpations: Abdomen is soft.     Tenderness: There is abdominal  tenderness in the epigastric area. There is no guarding or rebound.  Musculoskeletal:        General: No tenderness.     Cervical back: Neck supple.  Skin:    General: Skin is warm and dry.     Findings: No rash.  Neurological:     Mental Status: She is alert.     Cranial Nerves: No cranial nerve deficit (no facial droop, extraocular movements intact, no slurred speech).     Sensory: No sensory deficit.     Motor: No abnormal muscle tone or seizure activity.     Coordination: Coordination normal.     ED Results / Procedures / Treatments   Labs (all labs ordered are listed, but only abnormal results are displayed) Labs Reviewed  COMPREHENSIVE METABOLIC PANEL - Abnormal; Notable for the following components:      Result Value   Glucose, Bld 120 (*)    Total Protein 8.3 (*)    All other components within normal limits  CBC WITH DIFFERENTIAL/PLATELET - Abnormal; Notable for the following components:   MCH 25.9 (*)    Platelets 424 (*)    All other components within normal limits  URINALYSIS, ROUTINE W REFLEX MICROSCOPIC - Abnormal; Notable for the following components:   APPearance HAZY (*)    Glucose, UA 100 (*)    Bilirubin Urine SMALL (*)    Leukocytes,Ua TRACE (*)    All other components within normal limits  URINALYSIS, MICROSCOPIC (REFLEX) - Abnormal; Notable for the following components:   Bacteria, UA MANY (*)    All other components within normal limits  URINE CULTURE  LIPASE, BLOOD  PREGNANCY, URINE    EKG None  Radiology No results found.  Procedures Procedures (including critical care time)  Medications Ordered in ED Medications  ketorolac (TORADOL) 30 MG/ML injection 15 mg (15 mg Intravenous Given 10/22/19 0955)  ondansetron (ZOFRAN) injection 4 mg (4 mg Intravenous Given 10/22/19 5397)    ED Course  I have reviewed the triage vital signs and the nursing notes.  Pertinent labs &  imaging results that were available during my care of the patient were reviewed by me and considered in my medical decision making (see chart for details).  Clinical Course as of Oct 21 1112  Sun Oct 22, 2019  1100 Urinalysis shows trace leukocyte Estrace.  Lipase normal.  CBC normal.  Comprehensive metabolic panel normal.   [JK]  1105 Micro show many bacteria.  Just 6-10 squamous cells   [JK]    Clinical Course User Index [JK] Dorie Rank, MD   MDM Rules/Calculators/A&P                           Patient presented to the ED for evaluation of abdominal cramping. Patient's abdominal exam is benign. She is not having any guarding or rebound. Patient's laboratory tests are reassuring. Normal LFTs lipase and white blood cell count. Patient has been stressed about not having her colon cancer screening. This may be contributing to some of her abdominal discomfort and not showing any worrisome findings such as weight loss for anemia. I reassured the patient is still reasonable to do the colon cancer screening but all her test today appear to be normal with the exception of her urinalysis. Patient has noted some abnormal urine odor and dark color. Her urinalysis does show many bacteria and leukocyte esterase. I will treat her for urinary tract infection. Discussed outpatient follow-up with her  primary care doctor. Final Clinical Impression(s) / ED Diagnoses Final diagnoses:  Abdominal pain, unspecified abdominal location  Acute cystitis without hematuria    Rx / DC Orders ED Discharge Orders         Ordered    sulfamethoxazole-trimethoprim (BACTRIM DS) 800-160 MG tablet  2 times daily     Discontinue  Reprint     10/22/19 1113    phenazopyridine (PYRIDIUM) 200 MG tablet  3 times daily     Discontinue  Reprint     10/22/19 1113           Dorie Rank, MD 10/22/19 1116

## 2019-10-22 NOTE — ED Triage Notes (Addendum)
2 weeks ago, patient went to see Dr. Jeanie Cooks  to check for colon cancer.  Unable to obtain result d/t losing her driver's license to pick the mail to see the result. Patient also stated that her urine is very dark and foul-smelling.

## 2019-10-22 NOTE — ED Notes (Signed)
ED Provider at bedside. 

## 2019-10-23 LAB — URINE CULTURE: Culture: <10000 — AB

## 2019-10-30 ENCOUNTER — Encounter (HOSPITAL_BASED_OUTPATIENT_CLINIC_OR_DEPARTMENT_OTHER): Payer: Self-pay

## 2019-10-30 ENCOUNTER — Other Ambulatory Visit: Payer: Self-pay

## 2019-10-30 ENCOUNTER — Emergency Department (HOSPITAL_BASED_OUTPATIENT_CLINIC_OR_DEPARTMENT_OTHER)
Admission: EM | Admit: 2019-10-30 | Discharge: 2019-10-30 | Disposition: A | Discharge status: Home / Self Care | Payer: Medicaid Other | Attending: Emergency Medicine | Admitting: Emergency Medicine

## 2019-10-30 DIAGNOSIS — Z79899 Other long term (current) drug therapy: Secondary | ICD-10-CM | POA: Insufficient documentation

## 2019-10-30 DIAGNOSIS — J45909 Unspecified asthma, uncomplicated: Secondary | ICD-10-CM | POA: Diagnosis not present

## 2019-10-30 DIAGNOSIS — Z7951 Long term (current) use of inhaled steroids: Secondary | ICD-10-CM | POA: Diagnosis not present

## 2019-10-30 DIAGNOSIS — N939 Abnormal uterine and vaginal bleeding, unspecified: Secondary | ICD-10-CM

## 2019-10-30 DIAGNOSIS — R519 Headache, unspecified: Secondary | ICD-10-CM

## 2019-10-30 DIAGNOSIS — I10 Essential (primary) hypertension: Secondary | ICD-10-CM | POA: Diagnosis not present

## 2019-10-30 DIAGNOSIS — Z91040 Latex allergy status: Secondary | ICD-10-CM | POA: Diagnosis not present

## 2019-10-30 LAB — WET PREP, GENITAL
Sperm: NONE SEEN
Trich, Wet Prep: NONE SEEN
Yeast Wet Prep HPF POC: NONE SEEN

## 2019-10-30 LAB — CBC WITH DIFFERENTIAL/PLATELET
Abs Immature Granulocytes: 0.09 10*3/uL — ABNORMAL HIGH (ref 0.00–0.07)
Basophils Absolute: 0.1 10*3/uL (ref 0.0–0.1)
Basophils Relative: 1 %
Eosinophils Absolute: 0.1 10*3/uL (ref 0.0–0.5)
Eosinophils Relative: 1 %
HCT: 39.9 % (ref 36.0–46.0)
Hemoglobin: 12.5 g/dL (ref 12.0–15.0)
Immature Granulocytes: 1 %
Lymphocytes Relative: 34 %
Lymphs Abs: 2.8 10*3/uL (ref 0.7–4.0)
MCH: 25.8 pg — ABNORMAL LOW (ref 26.0–34.0)
MCHC: 31.3 g/dL (ref 30.0–36.0)
MCV: 82.4 fL (ref 80.0–100.0)
Monocytes Absolute: 0.3 10*3/uL (ref 0.1–1.0)
Monocytes Relative: 4 %
Neutro Abs: 4.9 10*3/uL (ref 1.7–7.7)
Neutrophils Relative %: 59 %
Platelets: 401 10*3/uL — ABNORMAL HIGH (ref 150–400)
RBC: 4.84 MIL/uL (ref 3.87–5.11)
RDW: 14.7 % (ref 11.5–15.5)
Smear Review: NORMAL
WBC Morphology: >10
WBC: 8.3 10*3/uL (ref 4.0–10.5)
nRBC: 0 % (ref 0.0–0.2)

## 2019-10-30 LAB — COMPREHENSIVE METABOLIC PANEL
ALT: 16 U/L (ref 0–44)
AST: 19 U/L (ref 15–41)
Albumin: 3.9 g/dL (ref 3.5–5.0)
Alkaline Phosphatase: 74 U/L (ref 38–126)
Anion gap: 8 (ref 5–15)
BUN: 6 mg/dL (ref 6–20)
CO2: 25 mmol/L (ref 22–32)
Calcium: 8.8 mg/dL — ABNORMAL LOW (ref 8.9–10.3)
Chloride: 104 mmol/L (ref 98–111)
Creatinine, Ser: 0.6 mg/dL (ref 0.44–1.00)
GFR calc Af Amer: >60 mL/min (ref >60)
GFR calc non Af Amer: >60 mL/min (ref >60)
Glucose, Bld: 103 mg/dL — ABNORMAL HIGH (ref 70–99)
Potassium: 3.6 mmol/L (ref 3.5–5.1)
Sodium: 137 mmol/L (ref 135–145)
Total Bilirubin: 0.3 mg/dL (ref 0.3–1.2)
Total Protein: 8.7 g/dL — ABNORMAL HIGH (ref 6.5–8.1)

## 2019-10-30 MED ORDER — METOCLOPRAMIDE HCL 5 MG/ML IJ SOLN
10.0000 mg | Freq: Once | INTRAMUSCULAR | Status: AC
  Administered 2019-10-30: 10 mg via INTRAVENOUS
  Filled 2019-10-30: qty 2

## 2019-10-30 MED ORDER — SODIUM CHLORIDE 0.9 % IV BOLUS
1000.0000 mL | Freq: Once | INTRAVENOUS | Status: AC
  Administered 2019-10-30: 1000 mL via INTRAVENOUS

## 2019-10-30 MED ORDER — DIPHENHYDRAMINE HCL 50 MG/ML IJ SOLN
12.5000 mg | Freq: Once | INTRAMUSCULAR | Status: AC
  Administered 2019-10-30: 12.5 mg via INTRAVENOUS
  Filled 2019-10-30: qty 1

## 2019-10-30 NOTE — ED Notes (Signed)
ED Provider at bedside. 

## 2019-10-30 NOTE — ED Triage Notes (Signed)
Pt arrives to ED with c/o headache and vaginal bleeding with some abdominal cramping Pt reports her last cycle was July 3rd, reports her cycles are normally regular. Pt reports her bleeding started Friday so did headache.

## 2019-10-30 NOTE — ED Provider Notes (Addendum)
[facility] EMERGENCY DEPARTMENT Provider Note   CSN: [id number] Arrival date & time: 10/30/19  1107     History Chief Complaint  Patient presents with  . Vaginal Bleeding  . Headache    Margaret Owens is a 49 y.o. female with history of uterine fibroids, presenting to the emergency department with vaginal bleeding that began on Friday.  She states her bleeding was mild in the beginning, then was no longer than a menstrual period.  She states she did have a menstrual period on 10/14/2019 as scheduled.  She has associated lower abdominal cramping.  No other abnormal discharge.  She states she has had some problems with uterine fibroids in the past with bleeding.  She was unsure if this could recur.  She does have a gynecologist she can follow with.  She She also reports headache that started Friday.  Headache is described as throbbing, frontal, with associated photophobia.  No vision changes or vomiting.  Symptoms are treated with naproxen and tylenol.  She denies lightheadedness or dyspnea on exertion, urinary sx, changes in bowels, fever.  Not on anticoagulation. The history is provided by the patient.       Past Medical History:  Diagnosis Date  . Anxiety   . Arthritis   . Asthma   . Bursitis   . Depression   . GERD (gastroesophageal reflux disease)   . Hypertension   . Muscle spasms of both lower extremities   . PTSD (post-traumatic stress disorder)   . Sciatica   . Seizures ([place])   . Uterine fibroid     Patient Active Problem List   Diagnosis Date Noted  . Primary osteoarthritis of left knee 07/04/2018  . Uterine fibroid 06/09/2017  . 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 colonoscopy       OB History    Gravida  3   Para  3   Term  3   Preterm      AB      Living  3     SAB  0   TAB  0   Ectopic  0   Multiple  0   Live Births  3           Family History  Problem Relation  Age of Onset  . Osteoarthritis Mother   . Breast cancer Maternal Grandmother        spread to lungs     Social History   Tobacco Use  . Smoking status: Never Smoker  . Smokeless tobacco: Never Used  Vaping Use  . Vaping Use: Never used  Substance Use Topics  . Alcohol use: Yes    Comment: 1/week  . Drug use: No    Home Medications Prior to Admission medications   Medication Sig Start Date End Date Taking? Authorizing Provider  albuterol (VENTOLIN HFA) 108 (90 Base) MCG/ACT inhaler Inhale 2 puffs into the lungs every 6 (six) hours as needed for wheezing (wheezing). 10/05/18   Fulp, Ander Gaster, MD  budesonide-formoterol (SYMBICORT) 160-4.5 MCG/ACT inhaler 2 puffs twice daily to control asthma 10/05/18   Fulp, Cammie, MD  cetirizine (ZYRTEC) 10 MG tablet Take 1 tablet (10 mg total) by mouth daily. 10/05/18   Fulp, Cammie, MD  cyclobenzaprine (FLEXERIL) 5 MG tablet Take 1 tablet (5 mg total) by mouth 3 (three) times daily as needed for muscle spasms. 10/05/18   Fulp, Cammie, MD  desonide (DESOWEN) 0.05 % cream  Apply 1 application topically 2 (two) times daily.    [provider]  diclofenac sodium (VOLTAREN) 1 % GEL Apply 4 grams every 6 hours (or 4 times per day) to the knees if needed for pain 09/10/18   Drenda Freeze, MD  DULoxetine (CYMBALTA) 30 MG capsule Take 30 mg by mouth 2 (two) times daily.    [provider]  fluticasone (FLONASE) 50 MCG/ACT nasal spray Place 2 sprays into both nostrils daily. 12/17/17   Fulp, Cammie, MD  gabapentin (NEURONTIN) 300 MG capsule Take 2 pills twice per day and 2 pills at bedtime for nerve pain 10/05/18   Fulp, Cammie, MD  meloxicam (MOBIC) 15 MG tablet Take 1 tablet (15 mg total) by mouth daily. As needed for pain. Take after eating 10/05/18   Fulp, Cammie, MD  methocarbamol (ROBAXIN) 500 MG tablet Take 1 tablet (500 mg total) by mouth 2 (two) times daily. 08/15/19   Garald Balding, PA-C  naproxen (NAPROSYN) 500 MG tablet Take 1 tablet  (500 mg total) by mouth 2 (two) times daily. 08/15/19   Garald Balding, PA-C  pantoprazole (PROTONIX) 40 MG tablet Take 1 tablet (40 mg total) by mouth 2 (two) times daily. 10/05/18   Fulp, Cammie, MD  phenazopyridine (PYRIDIUM) 200 MG tablet Take 1 tablet (200 mg total) by mouth 3 (three) times daily. 10/22/19   Dorie Rank, MD  phentermine 37.5 MG capsule Take 37.5 mg by mouth every morning.    [provider]  polyethylene glycol (MIRALAX) 17 g packet Take 17 g by mouth daily. 03/28/19   Fransico Meadow, PA-C  traZODone (DESYREL) 50 MG tablet Take 50 mg by mouth at bedtime as needed for sleep (sleep).     [provider]    Allergies    Latex and Penicillins  Review of Systems   Review of Systems  All other systems reviewed and are negative.   Physical Exam Updated Vital Signs BP 118/75 (BP Location: Left Arm)   Pulse 66   Temp 98.5 F (36.9 C) (Oral)   Resp 16   Ht 5\' 9"  (1.753 m)   Wt 113.4 kg   LMP 10/14/2019   SpO2 100%   BMI 36.92 kg/m   Physical Exam Vitals and nursing note reviewed.  Constitutional:      General: She is not in acute distress.    Appearance: She is well-developed. She is not ill-appearing.  HENT:     Head: Normocephalic and atraumatic.  Eyes:     Conjunctiva/sclera: Conjunctivae normal.  Cardiovascular:     Rate and Rhythm: Normal rate and regular rhythm.  Pulmonary:     Effort: Pulmonary effort is normal.     Breath sounds: Normal breath sounds.  Abdominal:     General: Bowel sounds are normal.     Palpations: Abdomen is soft.     Tenderness: There is abdominal tenderness in the right lower quadrant, suprapubic area and left lower quadrant. There is no guarding or rebound.  Genitourinary:    Labia:        Right: No rash or tenderness.        Left: No rash or tenderness.      Comments: Exam performed with female RN chaperone present.  Small amount of dark red blood is present at the cervix.  No obvious vaginal lacerations.   No other abnormal discharge.  There is very slight comfort with manipulation of the cervix and bilateral adnexa. Musculoskeletal:  Cervical back: Normal range of motion and neck supple. No rigidity or tenderness.  Skin:    General: Skin is warm.  Neurological:     Mental Status: She is alert.     Comments: Mental Status:  Alert, oriented, thought content appropriate, able to give a coherent history. Speech fluent without evidence of aphasia. Able to follow 2 step commands without difficulty.  Cranial Nerves intact.  Normal EOM and PERRL. Motor:  Normal tone.  Very poor effort with strength evaluation though appears to be equal to bilateral upper and lower extremities. Sensory: grossly normal in all extremities.  Cerebellar: normal finger-to-nose with bilateral upper extremities Gait: normal gait and balance CV: distal pulses palpable throughout    Psychiatric:        Behavior: Behavior normal.     ED Results / Procedures / Treatments   Labs (all labs ordered are listed, but only abnormal results are displayed) Labs Reviewed  WET PREP, GENITAL - Abnormal; Notable for the following components:      Result Value   Clue Cells Wet Prep HPF POC PRESENT (*)    WBC, Wet Prep HPF POC MODERATE (*)    All other components within normal limits  CBC WITH DIFFERENTIAL/PLATELET - Abnormal; Notable for the following components:   MCH 25.8 (*)    Platelets 401 (*)    Abs Immature Granulocytes 0.09 (*)    All other components within normal limits  COMPREHENSIVE METABOLIC PANEL - Abnormal; Notable for the following components:   Glucose, Bld 103 (*)    Calcium 8.8 (*)    Total Protein 8.7 (*)    All other components within normal limits  PREGNANCY, URINE  GC/CHLAMYDIA PROBE AMP ([place]) NOT AT [facility]    EKG None  Radiology No results found.  Procedures Procedures (including critical care time)  Medications Ordered in ED Medications  sodium chloride 0.9 % bolus 1,000 mL (0  mLs Intravenous Stopped 10/30/19 1539)  metoCLOPramide (REGLAN) injection 10 mg (10 mg Intravenous Given 10/30/19 1420)  diphenhydrAMINE (BENADRYL) injection 12.5 mg (12.5 mg Intravenous Given 10/30/19 1419)    ED Course  I have reviewed the triage vital signs and the nursing notes.  Pertinent labs & imaging results that were available during my care of the patient were reviewed by me and considered in my medical decision making (see chart for details).    MDM Rules/Calculators/A&P                          Patient with dysfunctional uterine bleeding, likely attributed to uterine fibroids.  Pelvic exam with small amount of blood, no profuse bleeding.  No obvious vaginal lacerations.  Slight discomfort on bimanual though no chandelier sign or significant CMT.  Wet prep with clue cells and white cells though exam is not consistent with BV.  Labs are reassuring, no leukocytosis, no anemia.  Vital signs are normal, afebrile.  Headache treated with improvement in symptoms.  Patient states she feels well and ready for discharge on reevaluation. Pt has not provided urine to pregnancy test, though test was negative last week during last ED visit. Urinary sx have not persisted. She is instructed to follow-up with her gynecologist for her uterine bleeding.  Instructed of strict return precautions.  Well-appearing, appropriate for discharge.  Final Clinical Impression(s) / ED Diagnoses Final diagnoses:  Abnormal uterine bleeding  Bad headache    Rx / DC Orders ED Discharge Orders    None  Geanna Divirgilio, [place] N, PA-C 10/30/19 1803    Montasia Chisenhall, [place] N, PA-C 10/30/19 1809    Malvin Johns, MD 11/02/19 1504

## 2019-10-30 NOTE — Discharge Instructions (Addendum)
Please schedule an appointment with your gynecologist to follow-up on your vaginal bleeding.  As discussed, it is likely due to your uterine fibroids. If you begin feeling significant lightheadedness, shortness of breath with exertion, and have worsening bleeding, please return to the emergency department.

## 2019-10-31 LAB — GC/CHLAMYDIA PROBE AMP (~~LOC~~) NOT AT ARMC
Chlamydia: NEGATIVE
Comment: NEGATIVE
Comment: NORMAL
Neisseria Gonorrhea: NEGATIVE

## 2019-11-06 ENCOUNTER — Other Ambulatory Visit: Payer: Self-pay | Admitting: Family Medicine

## 2019-11-06 DIAGNOSIS — K219 Gastro-esophageal reflux disease without esophagitis: Secondary | ICD-10-CM

## 2019-12-26 ENCOUNTER — Encounter: Payer: Medicaid Other | Attending: Physical Medicine & Rehabilitation | Admitting: Physical Medicine & Rehabilitation

## 2019-12-26 ENCOUNTER — Other Ambulatory Visit: Payer: Self-pay

## 2019-12-26 ENCOUNTER — Encounter: Payer: Self-pay | Admitting: Physical Medicine & Rehabilitation

## 2019-12-26 VITALS — BP 127/82 | HR 96 | Temp 98.3°F | Ht 69.0 in | Wt 264.0 lb

## 2019-12-26 DIAGNOSIS — M47816 Spondylosis without myelopathy or radiculopathy, lumbar region: Secondary | ICD-10-CM | POA: Diagnosis present

## 2019-12-26 DIAGNOSIS — M1712 Unilateral primary osteoarthritis, left knee: Secondary | ICD-10-CM

## 2019-12-26 DIAGNOSIS — M533 Sacrococcygeal disorders, not elsewhere classified: Secondary | ICD-10-CM

## 2019-12-26 DIAGNOSIS — M48061 Spinal stenosis, lumbar region without neurogenic claudication: Secondary | ICD-10-CM | POA: Diagnosis present

## 2019-12-26 DIAGNOSIS — Z5181 Encounter for therapeutic drug level monitoring: Secondary | ICD-10-CM | POA: Diagnosis present

## 2019-12-26 NOTE — Progress Notes (Signed)
RIGHT Sacroiliac radio frequency ablation under fluoroscopic guidance This consists of RIGHT L5 dorsal ramus radio frequency ablation plus neuro lysis of S1-S2 -S3 lateral branches  Indication is sacroiliac pain which has improved temporarily onby at least 50% following sacroiliac intra-articular injection and L5 , S1,2,3 Lateral branch blocks under fluoroscopic guidance. Pain interferes with self-care and mobility and has failed to respond to conservative measures.  Informed consent was obtained after discussing risks and benefits of the procedure with the patient these include bleeding bruising and infection temporary or permanent paralysis. The patient elects to proceed and has given written consent.  Patient placed prone on fluoroscopy table. Area marked and prepped with Betadine. Fluoroscopic images utilized to guide needle. 25-gauge 1.5 inch needle was used to anesthetize 4 injection points with 2 cc of 1% lidocaine each. Then a 18-gauge 10 cm RF needle with a 10 mm curved active tip was inserted under fluoroscopic guidance first targeting the S1 SA P./sacral ala junction, bone contact made and confirmed with lateral imaging.  motor stimulation at 2 Hz confirm proper needle location followed by injection of one cc of a solution containing   1% lidocaine MPF. Radio frequency 80C for 80 seconds was performed. Then the inferolateral aspect of the S1, S2 and lateral aspect of S3 sacral foramina were targeted. Bone contact made.  motor stim at 2 Hz confirm proper needle location. One ML of the /lidocaine solution was injected into each of 3 sites and radio frequency ablation 80C for 90 seconds was performed. Patient tolerated procedure well. Post procedure instructions given

## 2019-12-26 NOTE — Patient Instructions (Signed)

## 2019-12-26 NOTE — Progress Notes (Signed)
  PROCEDURE RECORD [facility]   Name: CLARIVEL CALLAWAY DOB:18-Apr-1970 MRN: [id number]  Date:12/26/2019  Physician: Alysia Penna, MD    Nurse/CMA: Alaisa Moffitt, CMA  Allergies:  Allergies  Allergen Reactions  . Latex Hives  . Penicillins Hives    Has patient had a PCN reaction causing immediate rash, facial/tongue/throat swelling, SOB or lightheadedness with hypotension: No Has patient had a PCN reaction causing severe rash involving mucus membranes or skin necrosis: No Has patient had a PCN reaction that required hospitalization No Has patient had a PCN reaction occurring within the last 10 years: No If all of the above answers are "NO", then may proceed with Cephalosporin use. Has patient had a PCN reaction causing immediate rash, facial/tongue/throat swelling, SOB or lightheadedness with hypotension: No Has patient had a PCN reaction causing severe rash involving mucus membranes or skin necrosis: No Has patient had a PCN reaction that required hospitalization No Has patient had a PCN reaction occurring within the last 10 years: No If all of the above answers are "NO", then may proceed with Cephalosporin use.    Consent Signed: Yes.    Is patient diabetic? No.  CBG today?   Pregnant: No. LMP: No LMP recorded. (age 29-55)  Anticoagulants: no Anti-inflammatory: no Antibiotics: no  Procedure: right sacroiliac radiofrequency  Position: Prone Start Time: 11:50am  End Time:  12:04pm       Fluoro Time: 43s  RN/CMA Selma Mink, CMA Donalyn Schneeberger, CMA    Time 11:35am 12:12pm     BP 127/82 149/87    Pulse 96 86    Respirations 16 16    O2 Sat 98 909    S/S 6 6    Pain Level 10/10 10/10     D/C home with self, patient A & O X 3, D/C instructions reviewed, and sits independently.

## 2019-12-29 ENCOUNTER — Ambulatory Visit
Admission: RE | Admit: 2019-12-29 | Discharge: 2019-12-29 | Disposition: A | Discharge status: Home / Self Care | Payer: Medicaid Other | Source: Ambulatory Visit | Attending: Physical Medicine & Rehabilitation | Admitting: Physical Medicine & Rehabilitation

## 2019-12-29 DIAGNOSIS — M1712 Unilateral primary osteoarthritis, left knee: Secondary | ICD-10-CM

## 2020-01-09 ENCOUNTER — Other Ambulatory Visit: Payer: Self-pay | Admitting: Family Medicine

## 2020-01-09 DIAGNOSIS — M545 Low back pain, unspecified: Secondary | ICD-10-CM

## 2020-01-09 NOTE — Telephone Encounter (Signed)
Requested medication (s) are due for refill today request RF expired Rx  Requested medication (s) are on the active medication list -yes  Future visit scheduled -no  Last refill: 08/04/19  Notes to clinic: Attempted to call patient to schedule appointment- home number - no longer in service, cell number- unable to take calls at this time. Request sent to PCP for review   Requested Prescriptions  Pending Prescriptions Disp Refills   gabapentin (NEURONTIN) 300 MG capsule [Pharmacy Med Name: GABAPENTIN 300 MG CAPS 300 Capsule] 180 capsule 4    Sig: TAKE 2 CAPSULES BY MOUTH TWICE A DAY AND 2 CAPSULES AT BEDTIME FOR NERVE PAIN      Neurology: Anticonvulsants - gabapentin Failed - 01/09/2020  1:06 PM      Failed - Valid encounter within last 12 months    Recent Outpatient Visits           1 year ago Chronic pain of left knee   [place] Fulp, [place], MD   1 year ago Lower abdominal pain   [place], [place], MD   1 year ago Mild intermittent acute asthmatic bronchitis with acute exacerbation   [place], [place], MD   1 year ago Chronic bilateral low back pain with bilateral sciatica   [place], [place], MD   2 years ago Chronic bilateral low back pain with bilateral sciatica   [place], MD                  Requested Prescriptions  Pending Prescriptions Disp Refills   gabapentin (NEURONTIN) 300 MG capsule [Pharmacy Med Name: GABAPENTIN 300 MG CAPS 300 Capsule] 180 capsule 4    Sig: TAKE 2 CAPSULES BY MOUTH TWICE A DAY AND 2 CAPSULES AT BEDTIME FOR NERVE PAIN      Neurology: Anticonvulsants - gabapentin Failed - 01/09/2020  1:06 PM      Failed - Valid encounter within last 12 months    Recent Outpatient Visits           1 year ago Chronic pain of left knee   [place], [place], MD   1 year ago Lower abdominal pain   [place], [place], MD   1 year ago Mild intermittent acute asthmatic bronchitis with acute exacerbation   [place], [employer], MD   1 year ago Chronic bilateral low back pain with bilateral sciatica   [place], MD   2 years ago Chronic bilateral low back pain with bilateral sciatica   [place], [place], MD

## 2020-01-25 ENCOUNTER — Encounter: Payer: Medicaid Other | Attending: Physical Medicine & Rehabilitation | Admitting: Physical Medicine & Rehabilitation

## 2020-01-25 ENCOUNTER — Other Ambulatory Visit: Payer: Self-pay

## 2020-01-25 ENCOUNTER — Encounter: Payer: Self-pay | Admitting: Physical Medicine & Rehabilitation

## 2020-01-25 VITALS — BP 135/85 | HR 99 | Temp 98.2°F | Ht 69.0 in | Wt 266.0 lb

## 2020-01-25 DIAGNOSIS — M1712 Unilateral primary osteoarthritis, left knee: Secondary | ICD-10-CM | POA: Diagnosis not present

## 2020-01-25 NOTE — Progress Notes (Signed)
Knee injection Without ultrasound guidance)  Indication:LEFT Knee pain not relieved by medication management and other conservative care.  Informed consent was obtained after describing risks and benefits of the procedure with the patient, this includes bleeding, bruising, infection and medication side effects. The patient wishes to proceed and has given written consent. The patient was placed in a recumbent position. The medial aspect of the knee was marked and prepped with Betadine and alcohol. It was then entered with a 30-gauge -1/2 inch needle and 2 mL of 1% lidocaine was injected into the skin and subcutaneous tissue. Then another 21g 2" needle was inserted into the LEFT knee joint. After negative draw back for blood, a solution containing 5 ML of Zilretta (32mg  controlled release triamcinolone) injected. The patient tolerated the procedure well. Post procedure instructions were given.

## 2020-01-25 NOTE — Patient Instructions (Signed)
Knee Injection A knee injection is a procedure to get medicine into your knee joint to relieve the pain, swelling, and stiffness of arthritis. Your health care provider uses a needle to inject medicine, which may also help to lubricate and cushion your knee joint. You may need more than one injection. Tell a health care provider about:  Any allergies you have.  All medicines you are taking, including vitamins, herbs, eye drops, creams, and over-the-counter medicines.  Any problems you or family members have had with anesthetic medicines.  Any blood disorders you have.  Any surgeries you have had.  Any medical conditions you have.  Whether you are pregnant or may be pregnant. What are the risks? Generally, this is a safe procedure. However, problems may occur, including:  Infection.  Bleeding.  Symptoms that get worse.  Damage to the area around your knee.  Allergic reaction to any of the medicines.  Skin reactions from repeated injections. What happens before the procedure?  Ask your health care provider about changing or stopping your regular medicines. This is especially important if you are taking diabetes medicines or blood thinners.  Plan to have someone take you home from the hospital or clinic. What happens during the procedure?   You will sit or lie down in a position for your knee to be treated.  The skin over your kneecap will be cleaned with a germ-killing soap.  You will be given a medicine that numbs the area (local anesthetic). You may feel some stinging.  The medicine will be injected into your knee. The needle is carefully placed between your kneecap and your knee. The medicine is injected into the joint space.  The needle will be removed at the end of the procedure.  A bandage (dressing) may be placed over the injection site. The procedure may vary among health care providers and hospitals. What can I expect after the procedure?  Your blood  pressure, heart rate, breathing rate, and blood oxygen level will be monitored until you leave the hospital or clinic.  You may have to move your knee through its full range of motion. This helps to get all the medicine into your joint space.  You will be watched to make sure that you do not have a reaction to the injected medicine.  You may feel more pain, swelling, and warmth than you did before the injection. This reaction may last about 1-2 days. Follow these instructions at home: Medicines  Take over-the-counter and prescription medicines only as told by your doctor.  Do not drive or use heavy machinery while taking prescription pain medicine.  Do not take medicines such as aspirin and ibuprofen unless your health care provider tells you to take them. Injection site care  Follow instructions from your health care provider about: ? How to take care of your puncture site. ? When and how you should change your dressing. ? When you should remove your dressing.  Check your injection area every day for signs of infection. Check for: ? More redness, swelling, or pain after 2 days. ? Fluid or blood. ? Pus or a bad smell. ? Warmth. Managing pain, stiffness, and swelling   If directed, put ice on the injection area: ? Put ice in a plastic bag. ? Place a towel between your skin and the bag. ? Leave the ice on for 20 minutes, 2-3 times per day.  Do not apply heat to your knee.  Raise (elevate) the injection area above the level   of your heart while you are sitting or lying down. General instructions  If you were given a dressing, keep it dry until your health care provider says it can be removed. Ask your health care provider when you can start showering or taking a bath.  Avoid strenuous activities for as long as directed by your health care provider. Ask your health care provider when you can return to your normal activities.  Keep all follow-up visits as told by your health  care provider. This is important. You may need more injections. Contact a health care provider if you have:  A fever.  Warmth in your injection area.  Fluid, blood, or pus coming from your injection site.  Symptoms at your injection site that last longer than 2 days after your procedure. Get help right away if:  Your knee: ? Turns very red. ? Becomes very swollen. ? Is in severe pain. Summary  A knee injection is a procedure to get medicine into your knee joint to relieve the pain, swelling, and stiffness of arthritis.  A needle is carefully placed between your kneecap and your knee to inject medicine into the joint space.  Before the procedure, ask your health care provider about changing or stopping your regular medicines, especially if you are taking diabetes medicines or blood thinners.  Contact your health care provider if you have any problems or questions after your procedure. This information is not intended to replace advice given to you by your health care provider. Make sure you discuss any questions you have with your health care provider. Document Revised: 04/19/2017 Document Reviewed: 04/19/2017 Elsevier Patient Education  2020 [employer]  

## 2020-01-30 ENCOUNTER — Other Ambulatory Visit (HOSPITAL_COMMUNITY): Payer: Self-pay | Admitting: Obstetrics

## 2020-01-30 ENCOUNTER — Ambulatory Visit (INDEPENDENT_AMBULATORY_CARE_PROVIDER_SITE_OTHER): Payer: Medicaid Other | Admitting: Obstetrics

## 2020-01-30 ENCOUNTER — Other Ambulatory Visit (HOSPITAL_COMMUNITY)
Admission: RE | Admit: 2020-01-30 | Discharge: 2020-01-30 | Disposition: A | Discharge status: Home / Self Care | Payer: Medicaid Other | Source: Ambulatory Visit | Attending: Obstetrics | Admitting: Obstetrics

## 2020-01-30 ENCOUNTER — Encounter: Payer: Self-pay | Admitting: Obstetrics

## 2020-01-30 ENCOUNTER — Other Ambulatory Visit: Payer: Self-pay

## 2020-01-30 VITALS — BP 126/80 | HR 74 | Ht 69.0 in | Wt 271.0 lb

## 2020-01-30 DIAGNOSIS — N760 Acute vaginitis: Secondary | ICD-10-CM | POA: Insufficient documentation

## 2020-01-30 DIAGNOSIS — N898 Other specified noninflammatory disorders of vagina: Secondary | ICD-10-CM | POA: Diagnosis not present

## 2020-01-30 DIAGNOSIS — Z6841 Body Mass Index (BMI) 40.0 and over, adult: Secondary | ICD-10-CM

## 2020-01-30 DIAGNOSIS — Z01419 Encounter for gynecological examination (general) (routine) without abnormal findings: Secondary | ICD-10-CM | POA: Insufficient documentation

## 2020-01-30 DIAGNOSIS — B9689 Other specified bacterial agents as the cause of diseases classified elsewhere: Secondary | ICD-10-CM | POA: Insufficient documentation

## 2020-01-30 DIAGNOSIS — N951 Menopausal and female climacteric states: Secondary | ICD-10-CM

## 2020-01-30 LAB — POCT URINE PREGNANCY: Preg Test, Ur: NEGATIVE

## 2020-01-30 MED ORDER — METRONIDAZOLE 0.75 % VA GEL: 1.0000 | Freq: Two times a day (BID) | VAGINAL | 5 refills | Status: DC

## 2020-01-30 NOTE — Progress Notes (Signed)
Subjective:        Margaret Owens is a 49 y.o. female here for a routine exam.  Current complaints: Normal periods up until August 2021, then no period since August.  Having occasional vasomotor symptoms and some malodorous vaginal discharge.  Personal health questionnaire:  Is patient Ashkenazi Jewish, have a family history of breast and/or ovarian cancer: yes Is there a family history of uterine cancer diagnosed at age < 59, gastrointestinal cancer, urinary tract cancer, family member who is a Field seismologist syndrome-associated carrier: yes Is the patient overweight and hypertensive, family history of diabetes, personal history of gestational diabetes, preeclampsia or PCOS: yes Is patient over 40, have PCOS,  family history of premature CHD under age 66, diabetes, smoke, have hypertension or peripheral artery disease:  no At any time, has a partner hit, kicked or otherwise hurt or frightened you?: no Over the past 2 weeks, have you felt down, depressed or hopeless?: no Over the past 2 weeks, have you felt little interest or pleasure in doing things?:no   Gynecologic History No LMP recorded. Contraception: none Last Pap: 2019. Results were: normal Last mammogram: 2021. Results were: normal  Obstetric History OB History  Gravida Para Term Preterm AB Living  3 3 3     3   SAB TAB Ectopic Multiple Live Births  0 0 0 0 3    # Outcome Date GA Lbr Len/2nd Weight Sex Delivery Anes PTL Lv  3 Term     M Vag-Spont   LIV  2 Term     F Vag-Spont   LIV  1 Term     M Vag-Spont   LIV    Past Medical History:  Diagnosis Date  . Anxiety   . Arthritis   . Asthma   . Bursitis   . Depression   . GERD (gastroesophageal reflux disease)   . Hypertension   . Muscle spasms of both lower extremities   . PTSD (post-traumatic stress disorder)   . Sciatica   . Seizures ([place])   . Uterine fibroid     Past Surgical History:  Procedure Laterality Date  . colonoscopy       Current Outpatient  Medications:  .  albuterol (VENTOLIN HFA) 108 (90 Base) MCG/ACT inhaler, Inhale 2 puffs into the lungs every 6 (six) hours as needed for wheezing (wheezing)., Disp: 18 g, Rfl: 5 .  budesonide-formoterol (SYMBICORT) 160-4.5 MCG/ACT inhaler, 2 puffs twice daily to control asthma, Disp: 1 Inhaler, Rfl: 5 .  cetirizine (ZYRTEC) 10 MG tablet, Take 1 tablet (10 mg total) by mouth daily., Disp: 30 tablet, Rfl: 5 .  cyclobenzaprine (FLEXERIL) 5 MG tablet, Take 1 tablet (5 mg total) by mouth 3 (three) times daily as needed for muscle spasms., Disp: 15 tablet, Rfl: 0 .  desonide (DESOWEN) 0.05 % cream, Apply 1 application topically 2 (two) times daily., Disp: , Rfl:  .  diclofenac sodium (VOLTAREN) 1 % GEL, Apply 4 grams every 6 hours (or 4 times per day) to the knees if needed for pain, Disp: 4 Tube, Rfl: 0 .  dicyclomine (BENTYL) 10 MG capsule, Take 10 mg by mouth 3 (three) times daily., Disp: , Rfl:  .  DULoxetine (CYMBALTA) 30 MG capsule, Take 30 mg by mouth 2 (two) times daily., Disp: , Rfl:  .  fluticasone (FLONASE) 50 MCG/ACT nasal spray, Place 2 sprays into both nostrils daily., Disp: 1 g, Rfl: 3 .  furosemide (LASIX) 20 MG tablet, Take 20 mg by mouth  daily., Disp: , Rfl:  .  gabapentin (NEURONTIN) 300 MG capsule, Take 2 pills twice per day and 2 pills at bedtime for nerve pain, Disp: 240 capsule, Rfl: 4 .  LINZESS 290 MCG CAPS capsule, Take 290 mcg by mouth daily., Disp: , Rfl:  .  meloxicam (MOBIC) 15 MG tablet, Take 1 tablet (15 mg total) by mouth daily. As needed for pain. Take after eating, Disp: 30 tablet, Rfl: 3 .  methocarbamol (ROBAXIN) 500 MG tablet, Take 1 tablet (500 mg total) by mouth 2 (two) times daily., Disp: 20 tablet, Rfl: 0 .  pantoprazole (PROTONIX) 40 MG tablet, Take 1 tablet (40 mg total) by mouth 2 (two) times daily., Disp: 60 tablet, Rfl: 3 .  phentermine (ADIPEX-P) 37.5 MG tablet, Take 37.5 mg by mouth at bedtime., Disp: , Rfl:  .  polyethylene glycol (MIRALAX) 17 g packet,  Take 17 g by mouth daily., Disp: 14 each, Rfl: 0 .  traZODone (DESYREL) 50 MG tablet, Take 50 mg by mouth at bedtime as needed for sleep (sleep). , Disp: , Rfl:  .  Vitamin D, Ergocalciferol, (DRISDOL) 1.25 MG (50000 UNIT) CAPS capsule, Take 50,000 Units by mouth once a week., Disp: , Rfl:  .  metroNIDAZOLE (METROGEL VAGINAL) 0.75 % vaginal gel, Place 1 Applicatorful vaginally 2 (two) times daily., Disp: 70 g, Rfl: 5 .  naproxen (NAPROSYN) 500 MG tablet, Take 1 tablet (500 mg total) by mouth 2 (two) times daily. (Patient not taking: Reported on 01/25/2020), Disp: 30 tablet, Rfl: 0 Allergies  Allergen Reactions  . Latex Hives  . Penicillins Hives    Has patient had a PCN reaction causing immediate rash, facial/tongue/throat swelling, SOB or lightheadedness with hypotension: No Has patient had a PCN reaction causing severe rash involving mucus membranes or skin necrosis: No Has patient had a PCN reaction that required hospitalization No Has patient had a PCN reaction occurring within the last 10 years: No If all of the above answers are "NO", then may proceed with Cephalosporin use. Has patient had a PCN reaction causing immediate rash, facial/tongue/throat swelling, SOB or lightheadedness with hypotension: No Has patient had a PCN reaction causing severe rash involving mucus membranes or skin necrosis: No Has patient had a PCN reaction that required hospitalization No Has patient had a PCN reaction occurring within the last 10 years: No If all of the above answers are "NO", then may proceed with Cephalosporin use.    Social History   Tobacco Use  . Smoking status: Never Smoker  . Smokeless tobacco: Never Used  Substance Use Topics  . Alcohol use: Yes    Comment: 1/week    Family History  Problem Relation Age of Onset  . Osteoarthritis Mother   . Breast cancer Maternal Grandmother        spread to lungs       Review of Systems  Constitutional: negative for fatigue and weight  loss Respiratory: negative for cough and wheezing Cardiovascular: negative for chest pain, fatigue and palpitations Gastrointestinal: negative for abdominal pain and change in bowel habits Musculoskeletal:negative for myalgias Neurological: negative for gait problems and tremors Behavioral/Psych: negative for abusive relationship, depression Endocrine: negative for temperature intolerance    Genitourinary: positive for abnormal menstrual periods - no period since August 2021.  Has occasional hot flashes, and is having some malodorous vaginal discharge. Negative for genital lesions. sexual problems  Integument/breast: negative for breast lump, breast tenderness, nipple discharge and skin lesion(s)    Objective:  BP 126/80   Pulse 74   Ht 5\' 9"  (1.753 m)   Wt 271 lb (122.9 kg)   BMI 40.02 kg/m  General:   alert and no distress  Skin:   no rash or abnormalities  Lungs:   clear to auscultation bilaterally  Heart:   regular rate and rhythm, S1, S2 normal, no murmur, click, rub or gallop  Breasts:   normal without suspicious masses, skin or nipple changes or axillary nodes  Abdomen:  normal findings: no organomegaly, soft, non-tender and no hernia  Pelvis:  External genitalia: normal general appearance Urinary system: urethral meatus normal and bladder without fullness, nontender Vaginal: normal without tenderness, induration or masses Cervix: normal appearance Adnexa: normal bimanual exam Uterus: anteverted and non-tender, normal size   Lab Review Urine pregnancy test Labs reviewed yes Radiologic studies reviewed yes  50% of 20 min visit spent on counseling and coordination of care.   Assessment:     1. Encounter for gynecological examination with Papanicolaou smear of cervix  2. Perimenopause Rx: - POCT urine pregnancy  3. Vaginal discharge  4. BV (bacterial vaginosis) Rx: - metroNIDAZOLE (METROGEL VAGINAL) 0.75 % vaginal gel; Place 1 Applicatorful vaginally 2  (two) times daily.  Dispense: 70 g; Refill: 5  5. Class 3 severe obesity due to excess calories without serious comorbidity with body mass index (BMI) of 40.0 to 44.9 in adult [facility]) - taking Phentermine Rx by PCP    Plan:    Education reviewed: calcium supplements, depression evaluation, low fat, low cholesterol diet, safe sex/STD prevention, self breast exams and weight bearing exercise. Contraception: none. Follow up in: 1 year.   Meds ordered this encounter  Medications  . metroNIDAZOLE (METROGEL VAGINAL) 0.75 % vaginal gel    Sig: Place 1 Applicatorful vaginally 2 (two) times daily.    Dispense:  70 g    Refill:  5   Orders Placed This Encounter  Procedures  . POCT urine pregnancy    Shelly Bombard, MD 01/30/2020 11:28 AM

## 2020-01-30 NOTE — Addendum Note (Signed)
Addended by: Lewie Loron D on: 01/30/2020 04:52 PM   Modules accepted: Orders

## 2020-01-30 NOTE — Progress Notes (Signed)
Last pap 07/2017 - Normal  Normal cycles until August, no bleeding since. Pt is currently taking Phentermine, pt is feeling bloated.  Pt is having some other GI issues.

## 2020-02-01 LAB — CERVICOVAGINAL ANCILLARY ONLY
Bacterial Vaginitis (gardnerella): POSITIVE — AB
Candida Glabrata: NEGATIVE
Candida Vaginitis: NEGATIVE
Chlamydia: NEGATIVE
Comment: NEGATIVE
Comment: NEGATIVE
Comment: NEGATIVE
Comment: NEGATIVE
Comment: NEGATIVE
Comment: NORMAL
Neisseria Gonorrhea: NEGATIVE
Trichomonas: NEGATIVE

## 2020-02-02 ENCOUNTER — Other Ambulatory Visit: Payer: Self-pay | Admitting: Obstetrics

## 2020-02-05 LAB — CYTOLOGY - PAP
Comment: NEGATIVE
Comment: NEGATIVE
Diagnosis: NEGATIVE
Diagnosis: REACTIVE
HPV 16: NEGATIVE
HPV 18 / 45: NEGATIVE
High risk HPV: POSITIVE — AB

## 2020-02-06 ENCOUNTER — Other Ambulatory Visit (HOSPITAL_COMMUNITY): Payer: Self-pay | Admitting: Physician Assistant

## 2020-02-12 ENCOUNTER — Other Ambulatory Visit (HOSPITAL_COMMUNITY): Payer: Self-pay | Admitting: Family Medicine

## 2020-02-24 ENCOUNTER — Emergency Department (HOSPITAL_BASED_OUTPATIENT_CLINIC_OR_DEPARTMENT_OTHER)
Admission: EM | Admit: 2020-02-24 | Discharge: 2020-02-24 | Disposition: A | Discharge status: Home / Self Care | Payer: Medicaid Other | Attending: Emergency Medicine | Admitting: Emergency Medicine

## 2020-02-24 ENCOUNTER — Encounter (HOSPITAL_BASED_OUTPATIENT_CLINIC_OR_DEPARTMENT_OTHER): Payer: Self-pay

## 2020-02-24 ENCOUNTER — Emergency Department (HOSPITAL_BASED_OUTPATIENT_CLINIC_OR_DEPARTMENT_OTHER): Payer: Medicaid Other

## 2020-02-24 ENCOUNTER — Other Ambulatory Visit: Payer: Self-pay

## 2020-02-24 DIAGNOSIS — E860 Dehydration: Secondary | ICD-10-CM | POA: Insufficient documentation

## 2020-02-24 DIAGNOSIS — I10 Essential (primary) hypertension: Secondary | ICD-10-CM | POA: Diagnosis not present

## 2020-02-24 DIAGNOSIS — J45909 Unspecified asthma, uncomplicated: Secondary | ICD-10-CM | POA: Insufficient documentation

## 2020-02-24 DIAGNOSIS — Z7951 Long term (current) use of inhaled steroids: Secondary | ICD-10-CM | POA: Insufficient documentation

## 2020-02-24 DIAGNOSIS — Z91040 Latex allergy status: Secondary | ICD-10-CM | POA: Insufficient documentation

## 2020-02-24 DIAGNOSIS — R112 Nausea with vomiting, unspecified: Secondary | ICD-10-CM | POA: Diagnosis not present

## 2020-02-24 DIAGNOSIS — R1084 Generalized abdominal pain: Secondary | ICD-10-CM | POA: Diagnosis not present

## 2020-02-24 DIAGNOSIS — Z79899 Other long term (current) drug therapy: Secondary | ICD-10-CM | POA: Diagnosis not present

## 2020-02-24 DIAGNOSIS — K219 Gastro-esophageal reflux disease without esophagitis: Secondary | ICD-10-CM | POA: Diagnosis not present

## 2020-02-24 LAB — COMPREHENSIVE METABOLIC PANEL
ALT: 14 U/L (ref 0–44)
AST: 16 U/L (ref 15–41)
Albumin: 3.8 g/dL (ref 3.5–5.0)
Alkaline Phosphatase: 59 U/L (ref 38–126)
Anion gap: 10 (ref 5–15)
BUN: 5 mg/dL — ABNORMAL LOW (ref 6–20)
CO2: 25 mmol/L (ref 22–32)
Calcium: 9 mg/dL (ref 8.9–10.3)
Chloride: 103 mmol/L (ref 98–111)
Creatinine, Ser: 0.53 mg/dL (ref 0.44–1.00)
GFR, Estimated: >60 mL/min (ref >60)
Glucose, Bld: 106 mg/dL — ABNORMAL HIGH (ref 70–99)
Potassium: 3.9 mmol/L (ref 3.5–5.1)
Sodium: 138 mmol/L (ref 135–145)
Total Bilirubin: 0.3 mg/dL (ref 0.3–1.2)
Total Protein: 8.1 g/dL (ref 6.5–8.1)

## 2020-02-24 LAB — CBC WITH DIFFERENTIAL/PLATELET
Abs Immature Granulocytes: 0.03 10*3/uL (ref 0.00–0.07)
Basophils Absolute: 0 10*3/uL (ref 0.0–0.1)
Basophils Relative: 0 %
Eosinophils Absolute: 0.1 10*3/uL (ref 0.0–0.5)
Eosinophils Relative: 2 %
HCT: 37.5 % (ref 36.0–46.0)
Hemoglobin: 11.8 g/dL — ABNORMAL LOW (ref 12.0–15.0)
Immature Granulocytes: 0 %
Lymphocytes Relative: 21 %
Lymphs Abs: 1.9 10*3/uL (ref 0.7–4.0)
MCH: 26.5 pg (ref 26.0–34.0)
MCHC: 31.5 g/dL (ref 30.0–36.0)
MCV: 84.1 fL (ref 80.0–100.0)
Monocytes Absolute: 0.6 10*3/uL (ref 0.1–1.0)
Monocytes Relative: 7 %
Neutro Abs: 6.3 10*3/uL (ref 1.7–7.7)
Neutrophils Relative %: 70 %
Platelets: 400 10*3/uL (ref 150–400)
RBC: 4.46 MIL/uL (ref 3.87–5.11)
RDW: 14.7 % (ref 11.5–15.5)
WBC: 9 10*3/uL (ref 4.0–10.5)
nRBC: 0 % (ref 0.0–0.2)

## 2020-02-24 LAB — URINALYSIS, ROUTINE W REFLEX MICROSCOPIC
Bilirubin Urine: NEGATIVE
Glucose, UA: NEGATIVE mg/dL
Ketones, ur: 15 mg/dL — AB
Leukocytes,Ua: NEGATIVE
Nitrite: NEGATIVE
Protein, ur: NEGATIVE mg/dL
Specific Gravity, Urine: >1.03 — ABNORMAL HIGH (ref 1.005–1.030)
pH: 5.5 (ref 5.0–8.0)

## 2020-02-24 LAB — URINALYSIS, MICROSCOPIC (REFLEX)

## 2020-02-24 LAB — PREGNANCY, URINE: Preg Test, Ur: NEGATIVE

## 2020-02-24 LAB — LIPASE, BLOOD: Lipase: 18 U/L (ref 11–51)

## 2020-02-24 MED ORDER — ONDANSETRON HCL 4 MG/2ML IJ SOLN
4.0000 mg | Freq: Once | INTRAMUSCULAR | Status: AC
  Administered 2020-02-24: 4 mg via INTRAVENOUS
  Filled 2020-02-24: qty 2

## 2020-02-24 MED ORDER — SODIUM CHLORIDE 0.9 % IV BOLUS
1000.0000 mL | Freq: Once | INTRAVENOUS | Status: AC
  Administered 2020-02-24: 1000 mL via INTRAVENOUS

## 2020-02-24 MED ORDER — ONDANSETRON 4 MG PO TBDP: 4.0000 mg | ORAL_TABLET | Freq: Three times a day (TID) | ORAL | 0 refills | Status: DC | PRN

## 2020-02-24 MED ORDER — IOHEXOL 300 MG/ML  SOLN
100.0000 mL | Freq: Once | INTRAMUSCULAR | Status: AC | PRN
  Administered 2020-02-24: 100 mL via INTRAVENOUS

## 2020-02-24 MED ORDER — KETOROLAC TROMETHAMINE 15 MG/ML IJ SOLN
15.0000 mg | Freq: Once | INTRAMUSCULAR | Status: AC
  Administered 2020-02-24: 15 mg via INTRAVENOUS
  Filled 2020-02-24: qty 1

## 2020-02-24 MED ORDER — MORPHINE SULFATE (PF) 4 MG/ML IV SOLN
4.0000 mg | Freq: Once | INTRAVENOUS | Status: AC
  Administered 2020-02-24: 4 mg via INTRAVENOUS
  Filled 2020-02-24: qty 1

## 2020-02-24 NOTE — ED Notes (Signed)
Pt tolerating food and drink, no nausea

## 2020-02-24 NOTE — ED Notes (Signed)
Nausea has improved, but pain is still 10/10. Provider notified.

## 2020-02-24 NOTE — ED Provider Notes (Signed)
[facility] EMERGENCY DEPARTMENT Provider Note   CSN: [id number] Arrival date & time: 02/24/20  9892     History Chief Complaint  Patient presents with  . Emesis    Margaret Owens is a 49 y.o. female.  Pt presents to the ED today with n/v and abdominal cramping.  Pt said she threw up last night around 2300 and again this morning.  She feels like her whole abdomen is cramping.  Pt denies f/c.          Past Medical History:  Diagnosis Date  . Anxiety   . Arthritis   . Asthma   . Bursitis   . Depression   . GERD (gastroesophageal reflux disease)   . Hypertension   . Muscle spasms of both lower extremities   . PTSD (post-traumatic stress disorder)   . Sciatica   . Seizures ([place])   . Uterine fibroid     Patient Active Problem List   Diagnosis Date Noted  . Primary osteoarthritis of left knee 07/04/2018  . Uterine fibroid 06/09/2017  . 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 colonoscopy       OB History    Gravida  3   Para  3   Term  3   Preterm      AB      Living  3     SAB  0   TAB  0   Ectopic  0   Multiple  0   Live Births  3           Family History  Problem Relation Age of Onset  . Osteoarthritis Mother   . Breast cancer Maternal Grandmother        spread to lungs     Social History   Tobacco Use  . Smoking status: Never Smoker  . Smokeless tobacco: Never Used  Vaping Use  . Vaping Use: Never used  Substance Use Topics  . Alcohol use: Yes    Comment: 1/week  . Drug use: No    Home Medications Prior to Admission medications   Medication Sig Start Date End Date Taking? Authorizing Provider  albuterol (VENTOLIN HFA) 108 (90 Base) MCG/ACT inhaler Inhale 2 puffs into the lungs every 6 (six) hours as needed for wheezing (wheezing). 10/05/18   Fulp, Ander Gaster, MD  budesonide-formoterol (SYMBICORT) 160-4.5 MCG/ACT inhaler 2 puffs twice daily to control asthma  10/05/18   Fulp, Cammie, MD  cetirizine (ZYRTEC) 10 MG tablet Take 1 tablet (10 mg total) by mouth daily. 10/05/18   Fulp, Cammie, MD  cyclobenzaprine (FLEXERIL) 5 MG tablet Take 1 tablet (5 mg total) by mouth 3 (three) times daily as needed for muscle spasms. 10/05/18   Fulp, Cammie, MD  desonide (DESOWEN) 0.05 % cream Apply 1 application topically 2 (two) times daily.    [provider]  diclofenac sodium (VOLTAREN) 1 % GEL Apply 4 grams every 6 hours (or 4 times per day) to the knees if needed for pain 09/10/18   Drenda Freeze, MD  dicyclomine (BENTYL) 10 MG capsule Take 10 mg by mouth 3 (three) times daily. 01/09/20   [provider]  DULoxetine (CYMBALTA) 30 MG capsule Take 30 mg by mouth 2 (two) times daily.    [provider]  fluticasone (FLONASE) 50 MCG/ACT nasal spray Place 2 sprays into both nostrils daily. 12/17/17   Fulp, Cammie, MD  furosemide (LASIX) 20 MG tablet  Take 20 mg by mouth daily. 01/09/20   [provider]  gabapentin (NEURONTIN) 300 MG capsule Take 2 pills twice per day and 2 pills at bedtime for nerve pain 10/05/18   Fulp, Cammie, MD  LINZESS 290 MCG CAPS capsule Take 290 mcg by mouth daily. 08/03/19   [provider]  meloxicam (MOBIC) 15 MG tablet Take 1 tablet (15 mg total) by mouth daily. As needed for pain. Take after eating 10/05/18   Fulp, Cammie, MD  methocarbamol (ROBAXIN) 500 MG tablet Take 1 tablet (500 mg total) by mouth 2 (two) times daily. 08/15/19   Garald Balding, PA-C  metroNIDAZOLE (METROGEL VAGINAL) 0.75 % vaginal gel Place 1 Applicatorful vaginally 2 (two) times daily. 01/30/20   Shelly Bombard, MD  naproxen (NAPROSYN) 500 MG tablet Take 1 tablet (500 mg total) by mouth 2 (two) times daily. Patient not taking: Reported on 01/25/2020 08/15/19   Sharyn Lull A, PA-C  ondansetron (ZOFRAN ODT) 4 MG disintegrating tablet Take 1 tablet (4 mg total) by mouth every 8 (eight) hours as needed. 02/24/20   Isla Pence, MD    pantoprazole (PROTONIX) 40 MG tablet Take 1 tablet (40 mg total) by mouth 2 (two) times daily. 10/05/18   Fulp, Cammie, MD  phentermine (ADIPEX-P) 37.5 MG tablet Take 37.5 mg by mouth at bedtime. 01/09/20   [provider]  polyethylene glycol (MIRALAX) 17 g packet Take 17 g by mouth daily. 03/28/19   Fransico Meadow, PA-C  traZODone (DESYREL) 50 MG tablet Take 50 mg by mouth at bedtime as needed for sleep (sleep).     [provider]  Vitamin D, Ergocalciferol, (DRISDOL) 1.25 MG (50000 UNIT) CAPS capsule Take 50,000 Units by mouth once a week. 01/09/20   [provider]    Allergies    Latex and Penicillins  Review of Systems   Review of Systems  Gastrointestinal: Positive for abdominal pain, nausea and vomiting.  All other systems reviewed and are negative.   Physical Exam Updated Vital Signs BP (!) 109/46   Pulse 79   Temp 98.7 F (37.1 C) (Oral)   Resp 18   Ht 5\' 9"  (1.753 m)   Wt 119.3 kg   LMP 11/15/2019   SpO2 93%   BMI 38.84 kg/m   Physical Exam Vitals and nursing note reviewed.  Constitutional:      Appearance: Normal appearance. She is obese.  HENT:     Head: Normocephalic and atraumatic.     Right Ear: External ear normal.     Left Ear: External ear normal.     Nose: Nose normal.     Mouth/Throat:     Mouth: Mucous membranes are dry.  Eyes:     Extraocular Movements: Extraocular movements intact.     Conjunctiva/sclera: Conjunctivae normal.     Pupils: Pupils are equal, round, and reactive to light.  Cardiovascular:     Rate and Rhythm: Normal rate and regular rhythm.     Pulses: Normal pulses.     Heart sounds: Normal heart sounds.  Pulmonary:     Effort: Pulmonary effort is normal.     Breath sounds: Normal breath sounds.  Abdominal:     General: Abdomen is flat. Bowel sounds are normal.     Palpations: Abdomen is soft.     Tenderness: There is generalized abdominal tenderness.  Musculoskeletal:        General: Normal  range of motion.     Cervical back: Normal range  of motion and neck supple.  Skin:    General: Skin is warm.     Capillary Refill: Capillary refill takes less than 2 seconds.  Neurological:     General: No focal deficit present.     Mental Status: She is alert and oriented to person, place, and time.  Psychiatric:        Mood and Affect: Mood normal.        Behavior: Behavior normal.        Thought Content: Thought content normal.        Judgment: Judgment normal.     ED Results / Procedures / Treatments   Labs (all labs ordered are listed, but only abnormal results are displayed) Labs Reviewed  CBC WITH DIFFERENTIAL/PLATELET - Abnormal; Notable for the following components:      Result Value   Hemoglobin 11.8 (*)    All other components within normal limits  COMPREHENSIVE METABOLIC PANEL - Abnormal; Notable for the following components:   Glucose, Bld 106 (*)    BUN 5 (*)    All other components within normal limits  URINALYSIS, ROUTINE W REFLEX MICROSCOPIC - Abnormal; Notable for the following components:   APPearance CLOUDY (*)    Specific Gravity, Urine >1.030 (*)    Hgb urine dipstick TRACE (*)    Ketones, ur 15 (*)    All other components within normal limits  URINALYSIS, MICROSCOPIC (REFLEX) - Abnormal; Notable for the following components:   Bacteria, UA MANY (*)    All other components within normal limits  LIPASE, BLOOD  PREGNANCY, URINE    EKG None  Radiology CT ABDOMEN PELVIS W CONTRAST  Result Date: 02/24/2020 CLINICAL DATA:  Acute abdominal pain. EXAM: CT ABDOMEN AND PELVIS WITH CONTRAST TECHNIQUE: Multidetector CT imaging of the abdomen and pelvis was performed using the standard protocol following bolus administration of intravenous contrast. CONTRAST:  140mL OMNIPAQUE IOHEXOL 300 MG/ML  SOLN COMPARISON:  March 28, 2019 FINDINGS: Lower chest: Band atelectasis versus airspace consolidation in the right lung base. Hepatobiliary: Hepatic steatosis.   Normal gallbladder. Pancreas: Unremarkable. No pancreatic ductal dilatation or surrounding inflammatory changes. Spleen: Normal in size without focal abnormality. Adrenals/Urinary Tract: Adrenal glands are unremarkable. Kidneys are normal, without renal calculi, focal lesion, or hydronephrosis. Bladder is unremarkable. Stomach/Bowel: Stomach is within normal limits. Appendix appears normal. No evidence of bowel wall thickening, distention, or inflammatory changes. Vascular/Lymphatic: No significant vascular findings are present. No enlarged abdominal or pelvic lymph nodes. Shotty mesenteric lymph nodes, nonspecific. Reproductive: 3.1 cm right adnexal cyst. Otherwise normal female genitalia. Other: Small fat containing periumbilical anterior abdominal wall hernia. No abdominopelvic ascites. Musculoskeletal: Posterior facet arthropathy and degenerative disc disease of the lower lumbosacral spine with 4 mm anterolisthesis of L4 on L5. IMPRESSION: 1. No evidence of acute abnormalities within the abdomen or pelvis. 2. Hepatic steatosis. 3. Small fat containing periumbilical anterior abdominal wall hernia. 4. 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 5. Band atelectasis versus airspace consolidation in the right lung base. Electronically Signed   By: Fidela Salisbury M.D.   On: 02/24/2020 12:17    Procedures Procedures (including critical care time)  Medications Ordered in ED Medications  ondansetron (ZOFRAN) injection 4 mg (4 mg Intravenous Given 02/24/20 1023)  sodium chloride 0.9 % bolus 1,000 mL (0 mLs Intravenous Stopped 02/24/20 1127)  ketorolac (TORADOL) 15 MG/ML injection 15 mg (15 mg Intravenous Given 02/24/20 1024)  morphine  4 MG/ML injection 4 mg (4 mg Intravenous Given 02/24/20 1116)  iohexol (OMNIPAQUE) 300 MG/ML solution 100 mL (100 mLs Intravenous Contrast Given 02/24/20 1127)    ED Course  I have  reviewed the triage vital signs and the nursing notes.  Pertinent labs & imaging results that were available during my care of the patient were reviewed by me and considered in my medical decision making (see chart for details).    MDM Rules/Calculators/A&P                          Pt has no sx of pneumonia.  She is feeling much better after treatment.  CT abd/pelvis showed nothing acute in her abdomen.  She is able to tolerate po fluids and crackers.  She is to return if owrse.  F/u with pcp.   Final Clinical Impression(s) / ED Diagnoses Final diagnoses:  Generalized abdominal pain  Dehydration  Non-intractable vomiting with nausea, unspecified vomiting type    Rx / DC Orders ED Discharge Orders         Ordered    ondansetron (ZOFRAN ODT) 4 MG disintegrating tablet  Every 8 hours PRN        02/24/20 1253           Isla Pence, MD 02/24/20 1259

## 2020-02-24 NOTE — ED Triage Notes (Signed)
Pt arrives ambulatory to ED with reports of 2 episodes of vomiting in the last 24 hours with some bloating. Pt did not take any medications PTA.

## 2020-03-05 ENCOUNTER — Other Ambulatory Visit (HOSPITAL_COMMUNITY): Payer: Self-pay | Admitting: Internal Medicine

## 2020-03-06 ENCOUNTER — Ambulatory Visit: Payer: Medicaid Other | Admitting: Family Medicine

## 2020-03-13 ENCOUNTER — Other Ambulatory Visit (HOSPITAL_COMMUNITY): Payer: Self-pay | Admitting: Family Medicine

## 2020-03-26 ENCOUNTER — Ambulatory Visit: Payer: Medicaid Other | Admitting: Internal Medicine

## 2020-04-01 ENCOUNTER — Other Ambulatory Visit (HOSPITAL_COMMUNITY): Payer: Self-pay | Admitting: Nurse Practitioner

## 2020-04-11 ENCOUNTER — Other Ambulatory Visit (HOSPITAL_COMMUNITY): Payer: Self-pay | Admitting: Family Medicine

## 2020-04-26 ENCOUNTER — Encounter: Payer: Medicaid Other | Attending: Physical Medicine & Rehabilitation | Admitting: Physical Medicine & Rehabilitation

## 2020-04-26 ENCOUNTER — Encounter: Payer: Self-pay | Admitting: Physical Medicine & Rehabilitation

## 2020-04-26 ENCOUNTER — Other Ambulatory Visit: Payer: Self-pay

## 2020-04-26 VITALS — BP 125/85 | HR 83 | Temp 98.2°F | Ht 69.0 in | Wt 257.0 lb

## 2020-04-26 DIAGNOSIS — M1712 Unilateral primary osteoarthritis, left knee: Secondary | ICD-10-CM | POA: Insufficient documentation

## 2020-04-26 NOTE — Progress Notes (Signed)
Knee injection Without ultrasound guidance)  Indication:LEFT Knee pain not relieved by medication management and other conservative care.  Informed consent was obtained after describing risks and benefits of the procedure with the patient, this includes bleeding, bruising, infection and medication side effects. The patient wishes to proceed and has given written consent. The patient was placed in a recumbent position. The medial aspect of the knee was marked and prepped with Betadine and alcohol. It was then entered with a 30-gauge -1/2 inch needle and 2 mL of 1% lidocaine was injected into the skin and subcutaneous tissue. Then another 21g 2" needle was inserted into the LEFT knee joint. After negative draw back for blood, a solution containing 5 ML of Zilretta (32mg controlled release triamcinolone) injected. The patient tolerated the procedure well. Post procedure instructions were given.    

## 2020-04-26 NOTE — Patient Instructions (Signed)
Knee Injection A knee injection is a procedure to get medicine into your knee joint to relieve the pain, swelling, and stiffness of arthritis. Your health care provider uses a needle to inject medicine, which may also help to lubricate and cushion your knee joint. You may need more than one injection. Tell a health care provider about:  Any allergies you have.  All medicines you are taking, including vitamins, herbs, eye drops, creams, and over-the-counter medicines.  Any problems you or family members have had with anesthetic medicines.  Any blood disorders you have.  Any surgeries you have had.  Any medical conditions you have.  Whether you are pregnant or may be pregnant. What are the risks? Generally, this is a safe procedure. However, problems may occur, including:  Infection.  Bleeding.  Symptoms that get worse.  Damage to the area around your knee.  Allergic reaction to any of the medicines.  Skin reactions from repeated injections. What happens before the procedure?  Ask your health care provider about: ? Changing or stopping your regular medicines. This is especially important if you are taking diabetes medicines or blood thinners. ? Taking medicines such as aspirin and ibuprofen. These medicines can thin your blood. Do not take these medicines unless your health care provider tells you to take them. ? Taking over-the-counter medicines, vitamins, herbs, and supplements.  Plan to have a responsible adult take you home from the hospital or clinic. What happens during the procedure?  You will sit or lie down in a position for your knee to be treated.  The skin over your kneecap will be cleaned with a germ-killing soap.  You will be given a medicine that numbs the area (local anesthetic). You may feel some stinging.  The medicine will be injected into your knee. The needle is carefully placed between your kneecap and your knee. The medicine is injected into the  joint space.  The needle will be removed at the end of the procedure.  A bandage (dressing) may be placed over the injection site. The procedure may vary among health care providers and hospitals.   What can I expect after the procedure?  Your blood pressure, heart rate, breathing rate, and blood oxygen level will be monitored until you leave the hospital or clinic.  You may have to move your knee through its full range of motion. This helps to get all the medicine into your joint space.  You will be watched to make sure that you do not have a reaction to the injected medicine.  You may feel more pain, swelling, and warmth than you did before the injection. This reaction may last about 1-2 days. Follow these instructions at home: Medicines  Take over-the-counter and prescription medicines only as told by your health care provider.  Ask your health care provider if the medicine prescribed to you requires you to avoid driving or using machinery.  Do not take medicines such as aspirin and ibuprofen unless your health care provider tells you to take them. Injection site care  Follow instructions from your health care provider about: ? How to take care of your puncture site. ? When and how you should change your dressing. ? When you should remove your dressing.  Check your injection area every day for signs of infection. Check for: ? More redness, swelling, or pain after 2 days. ? Fluid or blood. ? Pus or a bad smell. ? Warmth. Managing pain, stiffness, and swelling  If directed, put ice on   the injection area. To do this: ? Put ice in a plastic bag. ? Place a towel between your skin and the bag. ? Leave the ice on for 20 minutes, 2-3 times per day. ? Remove the ice if your skin turns bright red. This is very important. If you cannot feel pain, heat, or cold, you have a greater risk of damage to the area.  Do not apply heat to your knee.  Raise (elevate) the injection area  above the level of your heart while you are sitting or lying down.   General instructions  If you were given a dressing, keep it dry until your health care provider says it can be removed. Ask your health care provider when you can start showering or bathing.  Avoid strenuous activities for as long as directed by your health care provider. Ask your health care provider when you can return to your normal activities.  Keep all follow-up visits. This is important. You may need more injections. Contact a health care provider if you have:  A fever.  Warmth in your injection area.  Fluid, blood, or pus coming from your injection site.  Symptoms at your injection site that last longer than 2 days after your procedure. Get help right away if:  Your knee turns very red.  Your knee becomes very swollen.  Your knee is in severe pain. Summary  A knee injection is a procedure to get medicine into your knee joint to relieve the pain, swelling, and stiffness of arthritis.  A needle is carefully placed between your kneecap and your knee to inject medicine into the joint space.  Before the procedure, ask your health care provider about changing or stopping your regular medicines, especially if you are taking diabetes medicines or blood thinners.  Contact your health care provider if you have any problems or questions after your procedure. This information is not intended to replace advice given to you by your health care provider. Make sure you discuss any questions you have with your health care provider. Document Revised: 09/13/2019 Document Reviewed: 09/13/2019 Elsevier Patient Education  2021 Elsevier Inc.  

## 2020-05-02 ENCOUNTER — Other Ambulatory Visit (HOSPITAL_COMMUNITY): Payer: Self-pay | Admitting: Nurse Practitioner

## 2020-05-09 ENCOUNTER — Other Ambulatory Visit (HOSPITAL_COMMUNITY): Payer: Self-pay | Admitting: Family Medicine

## 2020-05-16 ENCOUNTER — Ambulatory Visit: Payer: Medicaid Other | Attending: Internal Medicine | Admitting: Internal Medicine

## 2020-05-16 ENCOUNTER — Encounter: Payer: Self-pay | Admitting: Internal Medicine

## 2020-05-16 ENCOUNTER — Other Ambulatory Visit (HOSPITAL_COMMUNITY): Payer: Self-pay

## 2020-05-16 ENCOUNTER — Other Ambulatory Visit: Payer: Self-pay

## 2020-05-16 VITALS — BP 128/84 | HR 79 | Temp 98.2°F | Resp 16 | Ht 69.0 in | Wt 257.6 lb

## 2020-05-16 DIAGNOSIS — J309 Allergic rhinitis, unspecified: Secondary | ICD-10-CM | POA: Diagnosis not present

## 2020-05-16 DIAGNOSIS — R7303 Prediabetes: Secondary | ICD-10-CM

## 2020-05-16 DIAGNOSIS — E669 Obesity, unspecified: Secondary | ICD-10-CM | POA: Diagnosis not present

## 2020-05-16 DIAGNOSIS — M1712 Unilateral primary osteoarthritis, left knee: Secondary | ICD-10-CM | POA: Insufficient documentation

## 2020-05-16 DIAGNOSIS — Z791 Long term (current) use of non-steroidal anti-inflammatories (NSAID): Secondary | ICD-10-CM | POA: Diagnosis not present

## 2020-05-16 DIAGNOSIS — Z79899 Other long term (current) drug therapy: Secondary | ICD-10-CM | POA: Insufficient documentation

## 2020-05-16 DIAGNOSIS — Z7951 Long term (current) use of inhaled steroids: Secondary | ICD-10-CM | POA: Diagnosis not present

## 2020-05-16 DIAGNOSIS — Z6839 Body mass index (BMI) 39.0-39.9, adult: Secondary | ICD-10-CM | POA: Diagnosis not present

## 2020-05-16 DIAGNOSIS — Z7984 Long term (current) use of oral hypoglycemic drugs: Secondary | ICD-10-CM | POA: Diagnosis not present

## 2020-05-16 DIAGNOSIS — F32A Depression, unspecified: Secondary | ICD-10-CM | POA: Insufficient documentation

## 2020-05-16 DIAGNOSIS — Z2821 Immunization not carried out because of patient refusal: Secondary | ICD-10-CM

## 2020-05-16 DIAGNOSIS — F419 Anxiety disorder, unspecified: Secondary | ICD-10-CM | POA: Insufficient documentation

## 2020-05-16 DIAGNOSIS — J4530 Mild persistent asthma, uncomplicated: Secondary | ICD-10-CM | POA: Diagnosis present

## 2020-05-16 NOTE — Progress Notes (Signed)
Patient ID: Margaret Owens, female    DOB: 1970-04-14  MRN: [id number]  CC: re-establish   Subjective: Margaret Owens is a 50 y.o. female who presents for chronic disease management.  Previous PCP was Dr. Chapman Fitch who is no longer with the practice. Her concerns today include:  Patient with history of osteoarthritis of the left knee, obesity, uterine fibroid, asthma, anxiety/depression,  Obesity: Patient seeing medical weight management through [facility].  She is on phentermine, Topamax and Metformin.  Reports that the Metformin was prescribed for prediabetes.  She has lost about 10 pounds since Thanksgiving.  Reports that she is drinking more water.  She used to drink 3 sodas a day but has cut back to drinking just 1 soda a day.  With the weight loss she has been able to move more.  Most days she walks for about 15 minutes.  She has osteoarthritis in her lower back and in the left knee.  She sees PMR intermittently for injection in the knee when needed.  She ambulates with a cane.  Asthma: depends on weather Asthma is sometimes worsened by allergy and postnasal drip symptoms.  She endorses coughing, sneezing and itchy ears at times.  Often wakes up during the night coughing.  She is on Flonase nasal spray, Zyrtec.  She is on Symbicort but is using it every other day and proair once a day.    Dep: Depression and gad screen positive today.  She is plugged in with a psychiatrist and therapist at [facility] and she sees them regularly.  She is on trazodone and Cymbalta and feels she is doing okay on medications.  Denies any suicidal ideation.    HM:  Has not had COVID vaccine.  She is skeptical about the vaccine and states that she wants to wait.  Patient Active Problem List   Diagnosis Date Noted  . Primary osteoarthritis of left knee 07/04/2018  . Uterine fibroid 06/09/2017  . 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 albuterol (VENTOLIN HFA) 108 (90 Base) MCG/ACT inhaler Inhale 2 puffs into the lungs every 6 (six) hours as needed for wheezing (wheezing). 18 g 5  . budesonide-formoterol (SYMBICORT) 160-4.5 MCG/ACT inhaler 2 puffs twice daily to control asthma 1 Inhaler 5  . buprenorphine (BUTRANS) 10 MCG/HR PTWK 1 patch once a week.    . cetirizine (ZYRTEC) 10 MG tablet Take 1 tablet (10 mg total) by mouth daily. 30 tablet 5  . Cholecalciferol (VITAMIN D3) 50 MCG (2000 UT) TABS     . cyclobenzaprine (FLEXERIL) 5 MG tablet Take 1 tablet (5 mg total) by mouth 3 (three) times daily as needed for muscle spasms. 15 tablet 0  . desonide (DESOWEN) 0.05 % cream Apply 1 application topically 2 (two) times daily.    . diclofenac sodium (VOLTAREN) 1 % GEL Apply 4 grams every 6 hours (or 4 times per day) to the knees if needed for pain 4 Tube 0  . dicyclomine (BENTYL) 10 MG capsule Take 10 mg by mouth 3 (three) times daily.    . DULoxetine (CYMBALTA) 30 MG capsule Take 30 mg by mouth 2 (two) times daily.    . fluticasone (FLONASE) 50 MCG/ACT nasal spray Place 2 sprays into both nostrils daily. 1 g 3  . furosemide (LASIX) 20 MG tablet Take 20 mg by mouth daily.    Marland Kitchen gabapentin (NEURONTIN) 300 MG capsule  Take 2 pills twice per day and 2 pills at bedtime for nerve pain 240 capsule 4  . LINZESS 290 MCG CAPS capsule Take 290 mcg by mouth daily.    Marland Kitchen MAG-G 500 (27 Mg) MG TABS Take by mouth.    . meloxicam (MOBIC) 15 MG tablet Take 1 tablet (15 mg total) by mouth daily. As needed for pain. Take after eating 30 tablet 3  . metFORMIN (GLUCOPHAGE) 500 MG tablet Take 500 mg by mouth daily.    . methocarbamol (ROBAXIN) 500 MG tablet Take 1 tablet (500 mg total) by mouth 2 (two) times daily. 20 tablet 0  . metroNIDAZOLE (METROGEL VAGINAL) 0.75 % vaginal gel Place 1 Applicatorful vaginally 2 (two) times daily. 70 g 5  . naproxen (NAPROSYN) 500 MG tablet Take 1 tablet (500 mg total) by mouth 2 (two)  times daily. 30 tablet 0  . NARCAN 4 MG/0.1ML LIQD nasal spray kit 1 spray once.    . ondansetron (ZOFRAN ODT) 4 MG disintegrating tablet Take 1 tablet (4 mg total) by mouth every 8 (eight) hours as needed. 10 tablet 0  . oxyCODONE-acetaminophen (PERCOCET) 10-325 MG tablet Take 1 tablet by mouth 4 (four) times daily as needed.    Marland Kitchen oxyCODONE-acetaminophen (PERCOCET/ROXICET) 5-325 MG tablet Take 1 tablet by mouth 3 (three) times daily as needed. (Patient not taking: Reported on 05/16/2020)    . pantoprazole (PROTONIX) 40 MG tablet Take 1 tablet (40 mg total) by mouth 2 (two) times daily. 60 tablet 3  . phentermine (ADIPEX-P) 37.5 MG tablet Take 37.5 mg by mouth at bedtime.    . polyethylene glycol (MIRALAX) 17 g packet Take 17 g by mouth daily. 14 each 0  . topiramate (TOPAMAX) 25 MG tablet Take 25 mg by mouth daily.    . traZODone (DESYREL) 50 MG tablet Take 50 mg by mouth at bedtime as needed for sleep (sleep).     . Vitamin D, Ergocalciferol, (DRISDOL) 1.25 MG (50000 UNIT) CAPS capsule Take 50,000 Units by mouth once a week.     No current facility-administered medications on file prior to visit.    Allergies  Allergen Reactions  . Latex Hives  . Penicillins Hives    Has patient had a PCN reaction causing immediate rash, facial/tongue/throat swelling, SOB or lightheadedness with hypotension: No Has patient had a PCN reaction causing severe rash involving mucus membranes or skin necrosis: No Has patient had a PCN reaction that required hospitalization No Has patient had a PCN reaction occurring within the last 10 years: No If all of the above answers are "NO", then may proceed with Cephalosporin use. Has patient had a PCN reaction causing immediate rash, facial/tongue/throat swelling, SOB or lightheadedness with hypotension: No Has patient had a PCN reaction causing severe rash involving mucus membranes or skin necrosis: No Has patient had a PCN reaction that required hospitalization No Has  patient had a PCN reaction occurring within the last 10 years: No If all of the above answers are "NO", then may proceed with Cephalosporin use.    Social History   Socioeconomic History  . Marital status: Single    Spouse name: Not on file  . Number of children: Not on file  . Years of education: Not on file  . Highest education level: Not on file  Occupational History  . Not on file  Tobacco Use  . Smoking status: Never Smoker  . Smokeless tobacco: Never Used  Vaping Use  . Vaping Use: Never used  Substance and Sexual Activity  . Alcohol use: Yes    Comment: 1/week  . Drug use: No  . Sexual activity: Yes    Partners: Male    Birth control/protection: None  Other Topics Concern  . Not on file  Social History Narrative  . Not on file   Social Determinants of Health   Financial Resource Strain: Not on file  Food Insecurity: Not on file  Transportation Needs: Not on file  Physical Activity: Not on file  Stress: Not on file  Social Connections: Not on file  Intimate Partner Violence: Not on file    Family History  Problem Relation Age of Onset  . Osteoarthritis Mother   . Breast cancer Maternal Grandmother        spread to lungs     Past Surgical History:  Procedure Laterality Date  . colonoscopy      ROS: Review of Systems Negative except as stated above  PHYSICAL EXAM: BP 128/84   Pulse 79   Temp 98.2 F (36.8 C)   Resp 16   Ht '5\' 9"'  (1.753 m)   Wt 257 lb 9.6 oz (116.8 kg)   SpO2 99%   BMI 38.04 kg/m   Wt Readings from Last 3 Encounters:  05/16/20 257 lb 9.6 oz (116.8 kg)  04/26/20 257 lb (116.6 kg)  02/24/20 263 lb (119.3 kg)    Physical Exam  General appearance - alert, well appearing, and in no distress Mental status - normal mood, behavior, speech, dress, motor activity, and thought processes Neck - supple, no significant adenopathy Nose: Mild enlargement of nasal turbinates. Mouth: No drainage noted at the back of the throat.  No  erythema or exudates noted. Chest - clear to auscultation, no wheezes, rales or rhonchi, symmetric air entry Heart - normal rate, regular rhythm, normal S1, S2, no murmurs, rubs, clicks or gallops Extremities - peripheral pulses normal, no pedal edema, no clubbing or cyanosis  Depression screen [facility] 2/9 05/16/2020 04/26/2020 01/25/2020  Decreased Interest 2 0 0  Down, Depressed, Hopeless 3 0 0  PHQ - 2 Score 5 0 0  Altered sleeping 3 - -  Tired, decreased energy 2 - -  Change in appetite 3 - -  Feeling bad or failure about yourself  3 - -  Trouble concentrating 3 - -  Moving slowly or fidgety/restless 0 - -  Suicidal thoughts 0 - -  PHQ-9 Score 19 - -  Difficult doing work/chores - - -   GAD 7 : Generalized Anxiety Score 05/16/2020 10/05/2018 07/04/2018 04/18/2018  Nervous, Anxious, on Edge '2 2 3 3  ' Control/stop worrying '2 2 3 3  ' Worry too much - different things '2 3 3 3  ' Trouble relaxing '2 3 3 3  ' Restless 0 '3 2 1  ' Easily annoyed or irritable '3 2 3 3  ' Afraid - awful might happen '2 3 3 3  ' Total GAD 7 Score '13 18 20 19  ' Anxiety Difficulty - Very difficult - -       CMP Latest Ref Rng & Units 02/24/2020 10/30/2019 10/22/2019  Glucose 70 - 99 mg/dL 106(H) 103(H) 120(H)  BUN 6 - 20 mg/dL 5(L) 6 8  Creatinine 0.44 - 1.00 mg/dL 0.53 0.60 0.57  Sodium 135 - 145 mmol/L 138 137 140  Potassium 3.5 - 5.1 mmol/L 3.9 3.6 3.7  Chloride 98 - 111 mmol/L 103 104 103  CO2 22 - 32 mmol/L '25 25 26  ' Calcium 8.9 - 10.3 mg/dL 9.0  8.8(L) 9.0  Total Protein 6.5 - 8.1 g/dL 8.1 8.7(H) 8.3(H)  Total Bilirubin 0.3 - 1.2 mg/dL 0.3 0.3 0.3  Alkaline Phos 38 - 126 U/L 59 74 74  AST 15 - 41 U/L '16 19 15  ' ALT 0 - 44 U/L '14 16 11   ' Lipid Panel  No results found for: CHOL, TRIG, HDL, CHOLHDL, VLDL, LDLCALC, LDLDIRECT  CBC    Component Value Date/Time   WBC 9.0 02/24/2020 1020   RBC 4.46 02/24/2020 1020   HGB 11.8 (L) 02/24/2020 1020   HGB 12.6 12/17/2017 1033   HCT 37.5 02/24/2020 1020   HCT 38.6  12/17/2017 1033   PLT 400 02/24/2020 1020   PLT 513 (H) 12/17/2017 1033   MCV 84.1 02/24/2020 1020   MCV 84 12/17/2017 1033   MCH 26.5 02/24/2020 1020   MCHC 31.5 02/24/2020 1020   RDW 14.7 02/24/2020 1020   RDW 14.5 12/17/2017 1033   LYMPHSABS 1.9 02/24/2020 1020   LYMPHSABS 3.4 (H) 12/17/2017 1033   MONOABS 0.6 02/24/2020 1020   EOSABS 0.1 02/24/2020 1020   EOSABS 0.1 12/17/2017 1033   BASOSABS 0.0 02/24/2020 1020   BASOSABS 0.0 12/17/2017 1033    ASSESSMENT AND PLAN: 1. Mild persistent asthma without complication 2. Allergic rhinitis, unspecified seasonality, unspecified trigger Continue Flonase and Zyrtec. Encouraged her to use the Symbicort twice a day instead of every other day.  Went over the difference between maintenance inhaler versus the rescue inhaler which is the proair.  3. Obesity (BMI 35.0-39.9 without comorbidity) 4. Prediabetes Commended her on weight loss so far.  Encouraged her to try to eat healthy and to continue walking daily.  She is being followed through weight management clinic at [facility].  5. Anxiety and depression Plugged into care through [facility].  6. Primary osteoarthritis of left knee Stressed the importance of weight loss.  7. COVID-19 vaccination declined Discussed this with her.  Patient informed that the vaccines are safe and effective.  Encouraged her to get vaccinated given her underlying lung disease.  Patient states she will think about it.    Patient was given the opportunity to ask questions.  Patient verbalized understanding of the plan and was able to repeat key elements of the plan.   No orders of the defined types were placed in this encounter.    Requested Prescriptions    No prescriptions requested or ordered in this encounter    No follow-ups on file.  Karle Plumber, MD, FACP

## 2020-05-16 NOTE — Patient Instructions (Signed)
Healthy Eating Following a healthy eating pattern may help you to achieve and maintain a healthy body weight, reduce the risk of chronic disease, and live a long and productive life. It is important to follow a healthy eating pattern at an appropriate calorie level for your body. Your nutritional needs should be met primarily through food by choosing a variety of nutrient-rich foods. What are tips for following this plan? Reading food labels  Read labels and choose the following: ? Reduced or low sodium. ? Juices with 100% fruit juice. ? Foods with low saturated fats and high polyunsaturated and monounsaturated fats. ? Foods with whole grains, such as whole wheat, cracked wheat, brown rice, and wild rice. ? Whole grains that are fortified with folic acid. This is recommended for women who are pregnant or who want to become pregnant.  Read labels and avoid the following: ? Foods with a lot of added sugars. These include foods that contain brown sugar, corn sweetener, corn syrup, dextrose, fructose, glucose, high-fructose corn syrup, honey, invert sugar, lactose, malt syrup, maltose, molasses, raw sugar, sucrose, trehalose, or turbinado sugar.  Do not eat more than the following amounts of added sugar per day:  6 teaspoons (25 g) for women.  9 teaspoons (38 g) for men. ? Foods that contain processed or refined starches and grains. ? Refined grain products, such as white flour, degermed cornmeal, white bread, and white rice. Shopping  Choose nutrient-rich snacks, such as vegetables, whole fruits, and nuts. Avoid high-calorie and high-sugar snacks, such as potato chips, fruit snacks, and candy.  Use oil-based dressings and spreads on foods instead of solid fats such as butter, stick margarine, or cream cheese.  Limit pre-made sauces, mixes, and "instant" products such as flavored rice, instant noodles, and ready-made pasta.  Try more plant-protein sources, such as tofu, tempeh, black beans,  edamame, lentils, nuts, and seeds.  Explore eating plans such as the Mediterranean diet or vegetarian diet. Cooking  Use oil to saut or stir-fry foods instead of solid fats such as butter, stick margarine, or lard.  Try baking, boiling, grilling, or broiling instead of frying.  Remove the fatty part of meats before cooking.  Steam vegetables in water or broth. Meal planning  At meals, imagine dividing your plate into fourths: ? One-half of your plate is fruits and vegetables. ? One-fourth of your plate is whole grains. ? One-fourth of your plate is protein, especially lean meats, poultry, eggs, tofu, beans, or nuts.  Include low-fat dairy as part of your daily diet.   Lifestyle  Choose healthy options in all settings, including home, work, school, restaurants, or stores.  Prepare your food safely: ? Wash your hands after handling raw meats. ? Keep food preparation surfaces clean by regularly washing with hot, soapy water. ? Keep raw meats separate from ready-to-eat foods, such as fruits and vegetables. ? Cook seafood, meat, poultry, and eggs to the recommended internal temperature. ? Store foods at safe temperatures. In general:  Keep cold foods at 7F (4.4C) or below.  Keep hot foods at 17F (60C) or above.  Keep your freezer at [facility] (-17.8C) or below.  Foods are no longer safe to eat when they have been between the temperatures of 40-17F (4.4-60C) for more than 2 hours. What foods should I eat? Fruits Aim to eat 2 cup-equivalents of fresh, canned (in natural juice), or frozen fruits each day. Examples of 1 cup-equivalent of fruit include 1 small apple, 8 large strawberries, 1 cup canned fruit,  cup dried fruit, or 1 cup 100% juice. Vegetables Aim to eat 2-3 cup-equivalents of fresh and frozen vegetables each day, including different varieties and colors. Examples of 1 cup-equivalent of vegetables include 2 medium carrots, 2 cups raw, leafy greens, 1 cup chopped  vegetable (raw or cooked), or 1 medium baked potato. Grains Aim to eat 6 ounce-equivalents of whole grains each day. Examples of 1 ounce-equivalent of grains include 1 slice of bread, 1 cup ready-to-eat cereal, 3 cups popcorn, or  cup cooked rice, pasta, or cereal. Meats and other proteins Aim to eat 5-6 ounce-equivalents of protein each day. Examples of 1 ounce-equivalent of protein include 1 egg, 1/2 cup nuts or seeds, or 1 tablespoon (16 g) peanut butter. A cut of meat or fish that is the size of a deck of cards is about 3-4 ounce-equivalents.  Of the protein you eat each week, try to have at least 8 ounces come from seafood. This includes salmon, trout, herring, and anchovies. Dairy Aim to eat 3 cup-equivalents of fat-free or low-fat dairy each day. Examples of 1 cup-equivalent of dairy include 1 cup (240 mL) milk, 8 ounces (250 g) yogurt, 1 ounces (44 g) natural cheese, or 1 cup (240 mL) fortified soy milk. Fats and oils  Aim for about 5 teaspoons (21 g) per day. Choose monounsaturated fats, such as canola and olive oils, avocados, peanut butter, and most nuts, or polyunsaturated fats, such as sunflower, corn, and soybean oils, walnuts, pine nuts, sesame seeds, sunflower seeds, and flaxseed. Beverages  Aim for six 8-oz glasses of water per day. Limit coffee to three to five 8-oz cups per day.  Limit caffeinated beverages that have added calories, such as soda and energy drinks.  Limit alcohol intake to no more than 1 drink a day for nonpregnant women and 2 drinks a day for men. One drink equals 12 oz of beer (355 mL), 5 oz of wine (148 mL), or 1 oz of hard liquor (44 mL). Seasoning and other foods  Avoid adding excess amounts of salt to your foods. Try flavoring foods with herbs and spices instead of salt.  Avoid adding sugar to foods.  Try using oil-based dressings, sauces, and spreads instead of solid fats. This information is based on general [place] nutrition guidelines. For more  information, visit choosemyplate.gov. Exact amounts may vary based on your nutrition needs. Summary  A healthy eating plan may help you to maintain a healthy weight, reduce the risk of chronic diseases, and stay active throughout your life.  Plan your meals. Make sure you eat the right portions of a variety of nutrient-rich foods.  Try baking, boiling, grilling, or broiling instead of frying.  Choose healthy options in all settings, including home, work, school, restaurants, or stores. This information is not intended to replace advice given to you by your health care provider. Make sure you discuss any questions you have with your health care provider. Document Revised: 07/12/2017 Document Reviewed: 07/12/2017 Elsevier Patient Education  2021 Elsevier Inc.  

## 2020-05-17 ENCOUNTER — Other Ambulatory Visit (HOSPITAL_COMMUNITY): Payer: Self-pay | Admitting: Internal Medicine

## 2020-05-17 ENCOUNTER — Other Ambulatory Visit: Payer: Self-pay | Admitting: Internal Medicine

## 2020-05-18 LAB — CBC
HCT: 38.1 % (ref 35.0–45.0)
Hemoglobin: 12.5 g/dL (ref 11.7–15.5)
MCH: 26.6 pg — ABNORMAL LOW (ref 27.0–33.0)
MCHC: 32.8 g/dL (ref 32.0–36.0)
MCV: 81.1 fL (ref 80.0–100.0)
MPV: 9.9 fL (ref 7.5–12.5)
Platelets: 498 10*3/uL — ABNORMAL HIGH (ref 140–400)
RBC: 4.7 10*6/uL (ref 3.80–5.10)
RDW: 14.6 % (ref 11.0–15.0)
WBC: 7.9 10*3/uL (ref 3.8–10.8)

## 2020-05-18 LAB — TSH: TSH: 2.25 mIU/L

## 2020-05-18 LAB — VITAMIN D 25 HYDROXY (VIT D DEFICIENCY, FRACTURES): Vit D, 25-Hydroxy: 26 ng/mL — ABNORMAL LOW (ref 30–100)

## 2020-05-18 LAB — COMPLETE METABOLIC PANEL WITH GFR
AG Ratio: 1.2 (calc) (ref 1.0–2.5)
ALT: 10 U/L (ref 6–29)
AST: 12 U/L (ref 10–35)
Albumin: 3.9 g/dL (ref 3.6–5.1)
Alkaline phosphatase (APISO): 68 U/L (ref 37–153)
BUN: 8 mg/dL (ref 7–25)
CO2: 23 mmol/L (ref 20–32)
Calcium: 9.1 mg/dL (ref 8.6–10.4)
Chloride: 106 mmol/L (ref 98–110)
Creat: 0.68 mg/dL (ref 0.50–1.05)
GFR, Est African American: 118 mL/min/{1.73_m2} (ref >=60)
GFR, Est Non African American: 102 mL/min/{1.73_m2} (ref >=60)
Globulin: 3.2 g/dL (calc) (ref 1.9–3.7)
Glucose, Bld: 107 mg/dL — ABNORMAL HIGH (ref 65–99)
Potassium: 4.1 mmol/L (ref 3.5–5.3)
Sodium: 140 mmol/L (ref 135–146)
Total Bilirubin: 0.3 mg/dL (ref 0.2–1.2)
Total Protein: 7.1 g/dL (ref 6.1–8.1)

## 2020-05-18 LAB — LIPID PANEL
Cholesterol: 181 mg/dL (ref <200)
HDL: 53 mg/dL (ref >=50)
LDL Cholesterol (Calc): 109 mg/dL (calc) — ABNORMAL HIGH
Non-HDL Cholesterol (Calc): 128 mg/dL (calc) (ref <130)
Total CHOL/HDL Ratio: 3.4 (calc) (ref <5.0)
Triglycerides: 91 mg/dL (ref <150)

## 2020-05-28 ENCOUNTER — Encounter: Payer: Self-pay | Admitting: Physical Medicine & Rehabilitation

## 2020-05-28 ENCOUNTER — Other Ambulatory Visit: Payer: Self-pay

## 2020-05-28 ENCOUNTER — Encounter: Payer: Medicaid Other | Attending: Physical Medicine & Rehabilitation | Admitting: Physical Medicine & Rehabilitation

## 2020-05-28 VITALS — BP 134/87 | HR 92 | Temp 98.3°F | Ht 69.0 in | Wt 250.0 lb

## 2020-05-28 DIAGNOSIS — M533 Sacrococcygeal disorders, not elsewhere classified: Secondary | ICD-10-CM | POA: Diagnosis not present

## 2020-05-28 DIAGNOSIS — M1712 Unilateral primary osteoarthritis, left knee: Secondary | ICD-10-CM

## 2020-05-28 NOTE — Patient Instructions (Signed)
Will repeat Right sacroiliac RF next month  Plan to do test blocks for left sacroiliac after that

## 2020-05-28 NOTE — Progress Notes (Signed)
Subjective:    Patient ID: Margaret Owens, female    DOB: 1970/07/16, 50 y.o.   MRN: [id number]  HPI 50 year old female with history of chronic left knee pain low back pain upper back pain who returns today with primary complaint of low back pain.  She did well after ultrasound-guided left knee Zilretta injection performed approximately 1 month ago.  Her current pain meds are Percocet and Butrans patch prescribed by primary MD.  The back pain is bilateral, historically has been worse on the right side.  The patient had good relief following radiofrequency neurotomy on December 26 2019 RIGHT Sacroiliac radio frequency ablation under fluoroscopic guidance This consists of RIGHT L5 dorsal ramus radio frequency ablation plus neuro lysis of S1-S2 -S3 lateral branches   LEFT KNEE - COMPLETE 4+ VIEW   COMPARISON:  04/26/2018   FINDINGS: Joint space narrowing and spurring in the patellofemoral compartment. Moderate joint effusion. No acute bony abnormality. Specifically, no fracture, subluxation, or dislocation.   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 Moderate joint effusion. No acute bony abnormality.     Electronically Signed   By: Rolm Baptise M.D.   On: 09/10/2018 21:15  CLINICAL DATA:  Chronic back pain for 5 years radiating to extremities, numbness in neck and back.   EXAM: MRI LUMBAR SPINE WITHOUT CONTRAST   TECHNIQUE: Multiplanar, multisequence MR imaging of the lumbar spine was performed. No intravenous contrast was administered.   COMPARISON:  MRI of lumbar spine October 14, 2014   FINDINGS: SEGMENTATION: For the purposes of this report, the last well-formed intervertebral disc will be described as L5-S1.   ALIGNMENT: Maintenance of the lumbar lordosis. Grade 1 anterolisthesis (3 mm, slightly progressed from prior imaging).   VERTEBRAE:Vertebral bodies are intact. Progressed moderate to severe L4-5 disc height loss with  central low signal compatible with vacuum disc. Disc desiccation L4-5 and L5-S1 with mild subacute on chronic discogenic endplate changes, trace acute component L4-5 and L5-S1. No suspicious bone marrow signal.   CONUS MEDULLARIS: Conus medullaris terminates at L1-2 and demonstrates normal morphology and signal characteristics. Cauda equina is normal.   PARASPINAL AND SOFT TISSUES: Included prevertebral and paraspinal soft tissues are nonacute . partially imaged 3 cm T2 bright RIGHT adnexal cyst.   DISC LEVELS:   L1-2 thru L3-4: No disc bulge, canal stenosis nor neural foraminal narrowing.   L4-5: Anterolisthesis. 3 mm broad-based disc bulge asymmetric laterally may affect the exited L4 nerves. Moderate facet arthropathy with bilateral facet widening to 4 mm. No canal stenosis. Severe bilateral neural foraminal narrowing worse than prior study.   L5-S1: 4 mm broad-based disc bulge. Mild facet arthropathy and ligamentum flavum redundancy with trace facet effusions which are likely reactive. Moderate RIGHT, severe LEFT neural foraminal narrowing is worse.   IMPRESSION: Increasing grade 1 L4-5 anterolisthesis with facet widening associated with dynamic instability.   Progressed degenerative change of the lower lumbar spine without canal stenosis. Severe L4-5 and L5-S1 neural foraminal narrowing.     Electronically Signed   By: Elon Alas M.D.   On: 07/18/2016 19:26 Pain Inventory Average Pain 10 Pain Right Now 8 My pain is constant, dull, stabbing, tingling and aching  In the last 24 hours, has pain interfered with the following? General activity 10 Relation with others 10 Enjoyment of life 10 What TIME of day is your pain at its worst? morning  Sleep (in general) Poor  Pain is worse with: walking, bending, sitting, standing and some  activites Pain improves with: heat/ice, therapy/exercise, medication, TENS and injections Relief from Meds: 8  Family History   Problem Relation Age of Onset  . Osteoarthritis Mother   . Breast cancer Maternal Grandmother        spread to lungs    Social History   Socioeconomic History  . Marital status: Single    Spouse name: Not on file  . Number of children: Not on file  . Years of education: Not on file  . Highest education level: Not on file  Occupational History  . Not on file  Tobacco Use  . Smoking status: Never Smoker  . Smokeless tobacco: Never Used  Vaping Use  . Vaping Use: Never used  Substance and Sexual Activity  . Alcohol use: Yes    Comment: 1/week  . Drug use: No  . Sexual activity: Yes    Partners: Male    Birth control/protection: None  Other Topics Concern  . Not on file  Social History Narrative  . Not on file   Social Determinants of Health   Financial Resource Strain: Not on file  Food Insecurity: Not on file  Transportation Needs: Not on file  Physical Activity: Not on file  Stress: Not on file  Social Connections: Not on file   Past Surgical History:  Procedure Laterality Date  . colonoscopy     Past Surgical History:  Procedure Laterality Date  . colonoscopy     Past Medical History:  Diagnosis Date  . Anxiety   . Arthritis   . Asthma   . Bursitis   . Depression   . GERD (gastroesophageal reflux disease)   . Hypertension   . Muscle spasms of both lower extremities   . PTSD (post-traumatic stress disorder)   . Sciatica   . Seizures ([place])   . Uterine fibroid    BP 134/87   Pulse 92   Temp 98.3 F (36.8 C)   Ht 5\' 9"  (1.753 m)   Wt 250 lb (113.4 kg)   BMI 36.92 kg/m   Opioid Risk Score:   Fall Risk Score:  `1  Depression screen PHQ 2/9  Depression screen [facility] 2/9 05/16/2020 04/26/2020 01/25/2020 10/05/2018 07/04/2018 04/18/2018 01/14/2018  Decreased Interest 2 0 0 2 3 3 3   Down, Depressed, Hopeless 3 0 0 2 3 3 3   PHQ - 2 Score 5 0 0 4 6 6 6   Altered sleeping 3 - - 2 3 3 3   Tired, decreased energy 2 - - 2 3 3 3   Change in appetite 3 - - 2 3 3  3   Feeling bad or failure about yourself  3 - - 2 3 3 3   Trouble concentrating 3 - - 2 3 3 3   Moving slowly or fidgety/restless 0 - - 1 2 1 3   Suicidal thoughts 0 - - 2 3 3 3   PHQ-9 Score 19 - - 17 26 25 27   Difficult doing work/chores - - - Very difficult - - -    Review of Systems  Constitutional: Negative.   HENT: Negative.   Eyes: Negative.   Respiratory: Negative.   Cardiovascular: Negative.   Gastrointestinal: Negative.   Endocrine: Negative.   Genitourinary: Negative.   Musculoskeletal: Positive for arthralgias, back pain, gait problem and myalgias.  Skin: Negative.   Allergic/Immunologic: Negative.   Neurological: Positive for weakness.       Neuropathy  Psychiatric/Behavioral: Negative.   All other systems reviewed and are negative.  Objective:   Physical Exam Vitals and nursing note reviewed.  Constitutional:      Appearance: She is obese.  HENT:     Head: Normocephalic and atraumatic.  Eyes:     Extraocular Movements: Extraocular movements intact.     Conjunctiva/sclera: Conjunctivae normal.     Pupils: Pupils are equal, round, and reactive to light.  Musculoskeletal:     Comments: Patient has reduced lumbar flexion and extension her extension is more limited than flexion and is accompanied by more pain.  Sacral thrust (prone) : Positive Lateral compression: Positive on right FABER's: Causes knee pain Distraction (supine): Negative   Skin:    General: Skin is warm and dry.  Neurological:     General: No focal deficit present.     Mental Status: She is alert and oriented to person, place, and time. Mental status is at baseline.  Psychiatric:        Mood and Affect: Mood normal.        Behavior: Behavior normal.           Assessment & Plan:  #1.  Chronic low back pain the patient has had better results with sacroiliac RF, lumbar medial branch blocks only gave a 20% relief of pain.  It appears that the right sacroiliac RF is starting to wear  off and will schedule for next month to repeat.  The patient is starting to complain of similar symptoms on the left side.  We discussed that once the right side is retreated we can see if the left side symptoms persist and if they do would recommend sacroiliac nerve blocks and if these produce at least a 50% relief that is temporary, would proceed to radiofrequency of the sacroiliac on the left side.  #2.  Left knee osteoarthritis improved after Zilretta injection this can be done every 3 months hope to stretch it out to 6 months

## 2020-05-31 ENCOUNTER — Other Ambulatory Visit (HOSPITAL_COMMUNITY): Payer: Self-pay | Admitting: Nurse Practitioner

## 2020-06-10 ENCOUNTER — Other Ambulatory Visit (HOSPITAL_COMMUNITY): Payer: Self-pay | Admitting: Family Medicine

## 2020-06-14 ENCOUNTER — Other Ambulatory Visit (HOSPITAL_COMMUNITY): Payer: Self-pay | Admitting: Internal Medicine

## 2020-07-01 ENCOUNTER — Other Ambulatory Visit (HOSPITAL_COMMUNITY): Payer: Self-pay | Admitting: Nurse Practitioner

## 2020-07-02 ENCOUNTER — Encounter: Payer: Self-pay | Admitting: Physical Medicine & Rehabilitation

## 2020-07-02 ENCOUNTER — Other Ambulatory Visit: Payer: Self-pay

## 2020-07-02 ENCOUNTER — Encounter: Payer: Medicaid Other | Attending: Physical Medicine & Rehabilitation | Admitting: Physical Medicine & Rehabilitation

## 2020-07-02 VITALS — BP 105/63 | HR 104 | Temp 98.4°F | Ht 69.0 in | Wt 244.2 lb

## 2020-07-02 DIAGNOSIS — M533 Sacrococcygeal disorders, not elsewhere classified: Secondary | ICD-10-CM | POA: Diagnosis present

## 2020-07-02 NOTE — Patient Instructions (Signed)
You had a radio frequency procedure today This was done to alleviate joint pain in your lumbar area We injected lidocaine which is a local anesthetic.  You may experience soreness at the injection sites. You may also experienced some irritation of the nerves that were heated I'm recommending ice for 30 minutes every 2 hours as needed for the next 24-48 hours   

## 2020-07-02 NOTE — Progress Notes (Addendum)
  PROCEDURE RECORD [name]   Name: Margaret Owens DOB:1971/03/11 MRN: [id number]  Date:07/02/2020  Physician: Alysia Penna, MD    Nurse/CMA:Lee CMA  Allergies:  Allergies  Allergen Reactions  . Latex Hives  . Penicillins Hives    Has patient had a PCN reaction causing immediate rash, facial/tongue/throat swelling, SOB or lightheadedness with hypotension: No Has patient had a PCN reaction causing severe rash involving mucus membranes or skin necrosis: No Has patient had a PCN reaction that required hospitalization No Has patient had a PCN reaction occurring within the last 10 years: No If all of the above answers are "NO", then may proceed with Cephalosporin use. Has patient had a PCN reaction causing immediate rash, facial/tongue/throat swelling, SOB or lightheadedness with hypotension: No Has patient had a PCN reaction causing severe rash involving mucus membranes or skin necrosis: No Has patient had a PCN reaction that required hospitalization No Has patient had a PCN reaction occurring within the last 10 years: No If all of the above answers are "NO", then may proceed with Cephalosporin use.    Consent Signed: Yes.    Is patient diabetic? No.  CBG today?   Pregnant: No. LMP: Patient's last menstrual period was 06/21/2020. (age 32-55)  Anticoagulants: no Anti-inflammatory: no Antibiotics: no  Procedure:right sacroiliac radiofrequency neurotomy Position: Prone Start Time: 11:59am End Time:12:15pm Fluoro Time: 27  RN/CMA Ralf Konopka RN Lee CMA    Time 11:35 12:22pm    BP 105/63 129/75    Pulse 104 94    Respirations 14 16    O2 Sat 100 99    S/S 6 6    Pain Level 10/10 5/10     D/C home  A & O X 3, D/C instructions reviewed, and sits independently.

## 2020-07-02 NOTE — Progress Notes (Signed)
RIGHT Sacroiliac radio frequency ablation under fluoroscopic guidance This consists of RIGHT L5 dorsal ramus radio frequency ablation plus neuro lysis of S1-S2 -S3 lateral branches  Indication is sacroiliac pain which has improved temporarily onby at least 50% following sacroiliac intra-articular injection and L5 , S1,2,3 Lateral branch blocks under fluoroscopic guidance. Pain interferes with self-care and mobility and has failed to respond to conservative measures.  Informed consent was obtained after discussing risks and benefits of the procedure with the patient these include bleeding bruising and infection temporary or permanent paralysis. The patient elects to proceed and has given written consent.  Patient placed prone on fluoroscopy table. Area marked and prepped with Betadine. Fluoroscopic images utilized to guide needle. 25-gauge 1.5 inch needle was used to anesthetize 4 injection points with 2 cc of 1% lidocaine each. Then a 18-gauge 15 cm RF needle with a 10 mm curved active tip was inserted under fluoroscopic guidance first targeting the S1 SA P./sacral ala junction, bone contact made and confirmed with lateral imaging.  motor stimulation at 2 Hz confirm proper needle location followed by injection of one cc of a solution containing   1% lidocaine MPF. Radio frequency 80C for 80 seconds was performed. Then the inferolateral aspect of the S1, S2 and lateral aspect of S3 sacral foramina were targeted. Bone contact made.  motor stim at 2 Hz confirm proper needle location. One ML of the /lidocaine solution was injected into each of 3 sites and radio frequency ablation 80C for 90 seconds was performed. Patient tolerated procedure well. Post procedure instructions given

## 2020-07-08 ENCOUNTER — Other Ambulatory Visit (HOSPITAL_COMMUNITY): Payer: Self-pay | Admitting: Family Medicine

## 2020-07-15 ENCOUNTER — Other Ambulatory Visit (HOSPITAL_COMMUNITY): Payer: Self-pay

## 2020-07-15 MED ORDER — DESONIDE 0.05 % EX CREA
TOPICAL_CREAM | Freq: Two times a day (BID) | CUTANEOUS | 0 refills | Status: AC | PRN
  Filled 2020-07-15 – 2020-07-19 (×3): qty 60, 30d supply, fill #0

## 2020-07-16 ENCOUNTER — Other Ambulatory Visit (HOSPITAL_COMMUNITY): Payer: Self-pay

## 2020-07-18 ENCOUNTER — Other Ambulatory Visit (HOSPITAL_COMMUNITY): Payer: Self-pay

## 2020-07-19 ENCOUNTER — Other Ambulatory Visit (HOSPITAL_COMMUNITY): Payer: Self-pay

## 2020-07-22 ENCOUNTER — Other Ambulatory Visit (HOSPITAL_COMMUNITY): Payer: Self-pay

## 2020-07-23 ENCOUNTER — Other Ambulatory Visit (HOSPITAL_COMMUNITY): Payer: Self-pay

## 2020-07-29 ENCOUNTER — Other Ambulatory Visit (HOSPITAL_COMMUNITY): Payer: Self-pay

## 2020-07-30 ENCOUNTER — Other Ambulatory Visit (HOSPITAL_COMMUNITY): Payer: Self-pay

## 2020-07-30 MED FILL — Oxycodone w/ Acetaminophen Tab 10-325 MG: ORAL | 30 days supply | Qty: 120 | Fill #0 | Status: AC

## 2020-07-31 ENCOUNTER — Other Ambulatory Visit (HOSPITAL_COMMUNITY): Payer: Self-pay

## 2020-07-31 MED ORDER — PHENTERMINE HCL 37.5 MG PO CAPS
ORAL_CAPSULE | ORAL | 0 refills | Status: DC
Start: 2020-07-31 — End: 2020-08-29
  Filled 2020-07-31: qty 30, 30d supply, fill #0

## 2020-07-31 MED ORDER — TOPIRAMATE 50 MG PO TABS
50.0000 mg | ORAL_TABLET | Freq: Every day | ORAL | 0 refills | Status: DC
Start: 2020-07-31 — End: 2020-08-29
  Filled 2020-07-31: qty 30, 30d supply, fill #0

## 2020-08-02 ENCOUNTER — Encounter: Payer: Medicaid Other | Attending: Physical Medicine & Rehabilitation | Admitting: Physical Medicine & Rehabilitation

## 2020-08-02 ENCOUNTER — Encounter: Payer: Self-pay | Admitting: Physical Medicine & Rehabilitation

## 2020-08-02 ENCOUNTER — Other Ambulatory Visit (HOSPITAL_COMMUNITY): Payer: Self-pay

## 2020-08-02 ENCOUNTER — Other Ambulatory Visit: Payer: Self-pay

## 2020-08-02 VITALS — BP 132/82 | HR 98 | Temp 98.1°F | Ht 69.0 in | Wt 238.0 lb

## 2020-08-02 DIAGNOSIS — M533 Sacrococcygeal disorders, not elsewhere classified: Secondary | ICD-10-CM | POA: Diagnosis not present

## 2020-08-02 NOTE — Patient Instructions (Signed)
Left sacroiliac test blocks performed will reassess effects in 1 month.

## 2020-08-02 NOTE — Progress Notes (Signed)
  PROCEDURE RECORD [facility]   Name: NAYDENE KAMROWSKI DOB:01-Sep-1970 MRN: [id number]  Date:08/02/2020  Physician: Alysia Penna, MD    Nurse/CMA: Alfredo Collymore, CMA  Allergies:  Allergies  Allergen Reactions  . Latex Hives  . Penicillins Hives    Has patient had a PCN reaction causing immediate rash, facial/tongue/throat swelling, SOB or lightheadedness with hypotension: No Has patient had a PCN reaction causing severe rash involving mucus membranes or skin necrosis: No Has patient had a PCN reaction that required hospitalization No Has patient had a PCN reaction occurring within the last 10 years: No If all of the above answers are "NO", then may proceed with Cephalosporin use. Has patient had a PCN reaction causing immediate rash, facial/tongue/throat swelling, SOB or lightheadedness with hypotension: No Has patient had a PCN reaction causing severe rash involving mucus membranes or skin necrosis: No Has patient had a PCN reaction that required hospitalization No Has patient had a PCN reaction occurring within the last 10 years: No If all of the above answers are "NO", then may proceed with Cephalosporin use.    Consent Signed: Yes.    Is patient diabetic? No.  CBG today?   Pregnant: No. LMP: No LMP recorded. (age 66-55)  Anticoagulants: no Anti-inflammatory: no Antibiotics: no  Procedure: left sacroiliac steroid injection  Position: Prone Start Time: 1:54pm  End Time: 2:00PM  Fluoro Time: 25s  RN/CMA Kerem Gilmer, CMA Kevron Patella, CMA    Time 1:35pm 2:03pm    BP 132/82 138/82    Pulse 98     Respirations 16 16    O2 Sat 97     S/S 6 6    Pain Level 8/10 5/10     D/C home with self, patient A & O X 3, D/C instructions reviewed, and sits independently.

## 2020-08-02 NOTE — Progress Notes (Signed)
L5 dorsal ramus S1-S2-S3 lateral branch blocks under fluoroscopic guidance Left side  Informed consent was obtained after describing risks and benefits of the procedure with patient these include bleeding bruising and infection.  He elects to proceed and has given written consent.  Patient placed prone on fluoroscopy table Betadine prep sterile drape a 25-gauge 1.5 inch needle was used to anesthetize skin and subcu tissue with 1% lidocaine 1 cc into each of 4 sites.  Then a 22-gauge 3.5" needle was inserted under fluoroscopic guidance for starting the S1 SAP sacral ala junction.  Bone contact made.  Isovue 200 x 0.5 mL demonstrated no intravascular uptake then 0.5 mL of 2% lidocaine was injected.  Then the lateral aspect of the S1, S2, S3 foramen was targeted.  Bone contact made out, Isovue-200 times 0.5 mL demonstrated no nerve root or intravascular uptake within 0.5 mL of 2% lidocaine solution was injected after negative drawback for blood.  Patient tolerated procedure well.  Postinjection instructions given. 

## 2020-08-06 ENCOUNTER — Other Ambulatory Visit (HOSPITAL_COMMUNITY): Payer: Self-pay

## 2020-08-07 ENCOUNTER — Other Ambulatory Visit (HOSPITAL_COMMUNITY): Payer: Self-pay

## 2020-08-07 MED ORDER — BUPRENORPHINE 7.5 MCG/HR TD PTWK
MEDICATED_PATCH | TRANSDERMAL | 0 refills | Status: DC
  Filled 2020-08-07: qty 4, 28d supply, fill #0

## 2020-08-07 MED ORDER — OXYCODONE-ACETAMINOPHEN 10-325 MG PO TABS: ORAL_TABLET | ORAL | 0 refills | Status: DC

## 2020-08-08 ENCOUNTER — Other Ambulatory Visit (HOSPITAL_COMMUNITY): Payer: Self-pay

## 2020-08-20 ENCOUNTER — Other Ambulatory Visit (HOSPITAL_COMMUNITY): Payer: Self-pay

## 2020-08-26 ENCOUNTER — Other Ambulatory Visit (HOSPITAL_COMMUNITY): Payer: Self-pay

## 2020-08-29 ENCOUNTER — Encounter: Payer: Self-pay | Admitting: Physical Medicine & Rehabilitation

## 2020-08-29 ENCOUNTER — Encounter: Payer: Medicaid Other | Attending: Physical Medicine & Rehabilitation | Admitting: Physical Medicine & Rehabilitation

## 2020-08-29 ENCOUNTER — Other Ambulatory Visit: Payer: Self-pay

## 2020-08-29 DIAGNOSIS — M4807 Spinal stenosis, lumbosacral region: Secondary | ICD-10-CM | POA: Diagnosis not present

## 2020-08-29 NOTE — Patient Instructions (Signed)
[facility] will call to schedule MRI

## 2020-08-29 NOTE — Progress Notes (Signed)
Subjective:    Patient ID: Margaret Owens, female    DOB: 04/23/1970, 50 y.o.   MRN: [id number]  HPI 50 year old female with chronic pain affecting low back right thigh left thigh and leg as well as right shoulder and arm and occluding thumb.  Pain currently 6 out of 10 sleep is poor related to pain pain is worse with walking bending standing proved with rest heat ice therapy exercise medication tendons as well as injections.  We discussed her left L5 dorsal ramus left S1 is to S3 lateral branch blocks under fluoroscopic guidance which was performed month ago.  Preinjection pain score was 8 out of 10 postinjection pain score was 5 out of 10.  We discussed that this is less than 50% and this indicates likely multiple pain generators and predicts poor results from radiofrequency neurotomy of the same nerves. Reviewed the last MRI from 07/18/2016 which demonstrated L4-5 lumbar foraminal stenosis as well as facet arthropathy and anterolisthesis grade 1, disc degeneration L4-5 and L5-S1 mild facet arthropathy L5-S1 severe right moderate left foraminal stenosis  Back pain with pain going down the right leg into the right heel   Some balance issues , legs feel clumsy, no numbness  No bowel or bladder issues   Last MRI 07/18/2016 EXAM: MRI LUMBAR SPINE WITHOUT CONTRAST  TECHNIQUE: Multiplanar, multisequence MR imaging of the lumbar spine was performed. No intravenous contrast was administered.  COMPARISON:  MRI of lumbar spine October 14, 2014  FINDINGS: SEGMENTATION: For the purposes of this report, the last well-formed intervertebral disc will be described as L5-S1.  ALIGNMENT: Maintenance of the lumbar lordosis. Grade 1 anterolisthesis (3 mm, slightly progressed from prior imaging).  VERTEBRAE:Vertebral bodies are intact. Progressed moderate to severe L4-5 disc height loss with central low signal compatible with vacuum disc. Disc desiccation L4-5 and L5-S1 with mild subacute on  chronic discogenic endplate changes, trace acute component L4-5 and L5-S1. No suspicious bone marrow signal.  CONUS MEDULLARIS: Conus medullaris terminates at L1-2 and demonstrates normal morphology and signal characteristics. Cauda equina is normal.  PARASPINAL AND SOFT TISSUES: Included prevertebral and paraspinal soft tissues are nonacute . partially imaged 3 cm T2 bright RIGHT adnexal cyst.  DISC LEVELS:  L1-2 thru L3-4: No disc bulge, canal stenosis nor neural foraminal narrowing.  L4-5: Anterolisthesis. 3 mm broad-based disc bulge asymmetric laterally may affect the exited L4 nerves. Moderate facet arthropathy with bilateral facet widening to 4 mm. No canal stenosis. Severe bilateral neural foraminal narrowing worse than prior study.  L5-S1: 4 mm broad-based disc bulge. Mild facet arthropathy and ligamentum flavum redundancy with trace facet effusions which are likely reactive. Moderate RIGHT, severe LEFT neural foraminal narrowing is worse.  IMPRESSION: Increasing grade 1 L4-5 anterolisthesis with facet widening associated with dynamic instability.  Progressed degenerative change of the lower lumbar spine without canal stenosis. Severe L4-5 and L5-S1 neural foraminal narrowing.   Electronically Signed   By: Elon Alas M.D.   On: 07/18/2016 19:26  Pain Inventory Average Pain 10 Pain Right Now 6 My pain is constant and aching  In the last 24 hours, has pain interfered with the following? General activity 10 Relation with others 10 Enjoyment of life 10 What TIME of day is your pain at its worst? daytime Sleep (in general) Poor  Pain is worse with: walking, bending, standing and some activites Pain improves with: rest, heat/ice, therapy/exercise, medication and injections Relief from Meds: 6  Family History  Problem Relation Age of Onset  .  Osteoarthritis Mother   . Breast cancer Maternal Grandmother        spread to lungs    Social  History   Socioeconomic History  . Marital status: Single    Spouse name: Not on file  . Number of children: Not on file  . Years of education: Not on file  . Highest education level: Not on file  Occupational History  . Not on file  Tobacco Use  . Smoking status: Never Smoker  . Smokeless tobacco: Never Used  Vaping Use  . Vaping Use: Never used  Substance and Sexual Activity  . Alcohol use: Yes    Comment: 1/week  . Drug use: No  . Sexual activity: Yes    Partners: Male    Birth control/protection: None  Other Topics Concern  . Not on file  Social History Narrative  . Not on file   Social Determinants of Health   Financial Resource Strain: Not on file  Food Insecurity: Not on file  Transportation Needs: Not on file  Physical Activity: Not on file  Stress: Not on file  Social Connections: Not on file   Past Surgical History:  Procedure Laterality Date  . colonoscopy     Past Surgical History:  Procedure Laterality Date  . colonoscopy     Past Medical History:  Diagnosis Date  . Anxiety   . Arthritis   . Asthma   . Bursitis   . Depression   . GERD (gastroesophageal reflux disease)   . Hypertension   . Muscle spasms of both lower extremities   . PTSD (post-traumatic stress disorder)   . Sciatica   . Seizures ([place])   . Uterine fibroid    There were no vitals taken for this visit.  Opioid Risk Score:   Fall Risk Score:  `1  Depression screen PHQ 2/9  Depression screen [facility] 2/9 07/02/2020 05/16/2020 04/26/2020 01/25/2020 10/05/2018 07/04/2018 04/18/2018  Decreased Interest 0 2 0 0 2 3 3   Down, Depressed, Hopeless 0 3 0 0 2 3 3   PHQ - 2 Score 0 5 0 0 4 6 6   Altered sleeping - 3 - - 2 3 3   Tired, decreased energy - 2 - - 2 3 3   Change in appetite - 3 - - 2 3 3   Feeling bad or failure about yourself  - 3 - - 2 3 3   Trouble concentrating - 3 - - 2 3 3   Moving slowly or fidgety/restless - 0 - - 1 2 1   Suicidal thoughts - 0 - - 2 3 3   PHQ-9 Score - 19 - - 17  26 25   Difficult doing work/chores - - - - Very difficult - -   Review of Systems  Musculoskeletal: Positive for gait problem.       Right shoulder & right arm pain Right hip down to right knee pain  Left hip pain down to left ankle with left knee swelling  Neurological: Positive for headaches.  All other systems reviewed and are negative.      Objective:   Physical Exam Vitals and nursing note reviewed.  Constitutional:      Appearance: She is obese.  HENT:     Head: Normocephalic and atraumatic.  Eyes:     Extraocular Movements: Extraocular movements intact.     Conjunctiva/sclera: Conjunctivae normal.     Pupils: Pupils are equal, round, and reactive to light.  Musculoskeletal:     Comments: Reduced lumbar range of motion 50%  extension 75% flexion Negative straight leg raising Mild tenderness palpation at the lumbosacral junction bilaterally. Mild pain over the greater trochanters Ambulates without assistive device no evidence of toe drag or knee instability  Skin:    General: Skin is warm and dry.  Neurological:     Mental Status: She is alert and oriented to person, place, and time.     Comments: Motor strength is 5/5 bilateral hip flexor knee extensor ankle dorsiflexor Sensation normal bilateral lower extremities to light touch. Sensation normal bilateral lower extremities to pinprick  Psychiatric:        Mood and Affect: Mood normal.        Behavior: Behavior normal.           Assessment & Plan:  1.  Chronic lumbar pain patient has lumbar degenerative disc some element of sacroiliac dysfunction as well as some lumbar facet arthropathy.  She has no physical exam findings significant for radiculopathy. Given worsening symptoms that have not responded well to current management would recommend repeat MRI as it has been 4 years since the last 1. I will see her back 1 month as long she has had the MRI of the lumbar spine to review.

## 2020-08-30 ENCOUNTER — Ambulatory Visit: Payer: Medicaid Other | Admitting: Physical Medicine & Rehabilitation

## 2020-08-30 ENCOUNTER — Other Ambulatory Visit (HOSPITAL_COMMUNITY): Payer: Self-pay

## 2020-08-30 MED ORDER — FERROUS SULFATE 324 (65 FE) MG PO TBEC: 1.0000 | DELAYED_RELEASE_TABLET | Freq: Every day | ORAL | 1 refills | Status: DC

## 2020-08-30 MED ORDER — VITAMIN D (ERGOCALCIFEROL) 1.25 MG (50000 UNIT) PO CAPS
ORAL_CAPSULE | ORAL | 1 refills | Status: DC
  Filled 2020-08-30: qty 12, 84d supply, fill #0

## 2020-08-30 MED ORDER — TOPIRAMATE 100 MG PO TABS
100.0000 mg | ORAL_TABLET | Freq: Every day | ORAL | 0 refills | Status: DC
  Filled 2020-08-30: qty 30, 30d supply, fill #0

## 2020-08-30 MED ORDER — PHENTERMINE HCL 37.5 MG PO CAPS
ORAL_CAPSULE | ORAL | 0 refills | Status: DC
  Filled 2020-08-30: qty 30, 30d supply, fill #0

## 2020-09-02 ENCOUNTER — Telehealth: Payer: Self-pay | Admitting: Physical Medicine & Rehabilitation

## 2020-09-02 NOTE — Telephone Encounter (Signed)
Tried calling patient to let her know there is no approval necessary for her MRI.  Her voicemail box is full and I can't leave one.  Patient will need to call [facility]'s centralized scheduling to get it scheduled at [phone].  Patient will need to let them know she wants it scheduled over at [facility].

## 2020-09-03 ENCOUNTER — Other Ambulatory Visit (HOSPITAL_COMMUNITY): Payer: Self-pay

## 2020-09-04 ENCOUNTER — Other Ambulatory Visit (HOSPITAL_COMMUNITY): Payer: Self-pay

## 2020-09-04 MED FILL — Pantoprazole Sodium EC Tab 40 MG (Base Equiv): ORAL | 30 days supply | Qty: 30 | Fill #0 | Status: AC

## 2020-09-05 ENCOUNTER — Other Ambulatory Visit (HOSPITAL_COMMUNITY): Payer: Self-pay

## 2020-09-07 ENCOUNTER — Ambulatory Visit (HOSPITAL_BASED_OUTPATIENT_CLINIC_OR_DEPARTMENT_OTHER)
Admission: RE | Admit: 2020-09-07 | Discharge: 2020-09-07 | Disposition: A | Discharge status: Home / Self Care | Payer: Medicaid Other | Source: Ambulatory Visit | Attending: Physical Medicine & Rehabilitation | Admitting: Physical Medicine & Rehabilitation

## 2020-09-07 ENCOUNTER — Other Ambulatory Visit: Payer: Self-pay

## 2020-09-07 DIAGNOSIS — M4807 Spinal stenosis, lumbosacral region: Secondary | ICD-10-CM | POA: Insufficient documentation

## 2020-09-13 ENCOUNTER — Ambulatory Visit: Payer: Medicaid Other | Admitting: Internal Medicine

## 2020-09-14 ENCOUNTER — Ambulatory Visit (HOSPITAL_BASED_OUTPATIENT_CLINIC_OR_DEPARTMENT_OTHER): Payer: Medicaid Other

## 2020-09-18 ENCOUNTER — Telehealth: Payer: Self-pay | Admitting: *Deleted

## 2020-09-18 NOTE — Telephone Encounter (Signed)
Attempted to call Margaret Owens but she did not answer and mailbox full.  She has not activated her MyChart. She has appt 10/04/20.

## 2020-09-18 NOTE — Telephone Encounter (Signed)
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 This may cause worsening of symptoms. T12-L-4 look good, Moderate degenerative changes at L5-S1 but no worse than 2018.   Can inform pt will discuss further at next visit

## 2020-09-19 ENCOUNTER — Telehealth: Payer: Self-pay | Admitting: *Deleted

## 2020-09-19 NOTE — Telephone Encounter (Signed)
Livian called back about MRI results. Dr Letta Pate message given to her (see previous phone message).

## 2020-09-27 ENCOUNTER — Other Ambulatory Visit (HOSPITAL_COMMUNITY): Payer: Self-pay

## 2020-09-27 MED ORDER — PHENTERMINE HCL 37.5 MG PO CAPS
ORAL_CAPSULE | ORAL | 0 refills | Status: AC
  Filled 2020-10-02: qty 30, 30d supply, fill #0

## 2020-09-27 MED ORDER — FERROUS SULFATE 324 (65 FE) MG PO TBEC: 1.0000 | DELAYED_RELEASE_TABLET | Freq: Every day | ORAL | 1 refills | Status: AC

## 2020-09-27 MED ORDER — VITAMIN D (ERGOCALCIFEROL) 1.25 MG (50000 UNIT) PO CAPS
ORAL_CAPSULE | ORAL | 1 refills | Status: DC
  Filled 2020-10-02: qty 12, fill #0

## 2020-09-27 MED ORDER — TOPIRAMATE 100 MG PO TABS
100.0000 mg | ORAL_TABLET | Freq: Every day | ORAL | 0 refills | Status: AC
  Filled 2020-10-02: qty 30, 30d supply, fill #0

## 2020-10-02 ENCOUNTER — Other Ambulatory Visit (HOSPITAL_COMMUNITY): Payer: Self-pay

## 2020-10-02 MED ORDER — GABAPENTIN 300 MG PO CAPS
ORAL_CAPSULE | ORAL | 6 refills | Status: AC
  Filled 2020-10-02: qty 120, 30d supply, fill #0

## 2020-10-02 MED ORDER — BUPRENORPHINE 7.5 MCG/HR TD PTWK
MEDICATED_PATCH | TRANSDERMAL | 0 refills | Status: DC
  Filled 2020-10-02: qty 4, 28d supply, fill #0

## 2020-10-02 MED ORDER — OXYCODONE-ACETAMINOPHEN 10-325 MG PO TABS
ORAL_TABLET | ORAL | 0 refills | Status: DC
  Filled 2020-10-02: qty 120, 30d supply, fill #0

## 2020-10-03 ENCOUNTER — Other Ambulatory Visit (HOSPITAL_COMMUNITY): Payer: Self-pay

## 2020-10-03 MED ORDER — NALOXONE HCL 4 MG/0.1ML NA LIQD
NASAL | 99 refills | Status: AC
  Filled 2020-10-03: qty 2, 1d supply, fill #0

## 2020-10-04 ENCOUNTER — Encounter: Payer: Medicaid Other | Attending: Physical Medicine & Rehabilitation | Admitting: Physical Medicine & Rehabilitation

## 2020-10-04 ENCOUNTER — Encounter: Payer: Self-pay | Admitting: Physical Medicine & Rehabilitation

## 2020-10-04 ENCOUNTER — Other Ambulatory Visit: Payer: Self-pay

## 2020-10-04 ENCOUNTER — Other Ambulatory Visit (HOSPITAL_COMMUNITY): Payer: Self-pay

## 2020-10-04 VITALS — BP 122/80 | HR 98 | Temp 99.0°F | Ht 69.0 in | Wt 238.0 lb

## 2020-10-04 DIAGNOSIS — M4316 Spondylolisthesis, lumbar region: Secondary | ICD-10-CM | POA: Diagnosis not present

## 2020-10-04 DIAGNOSIS — M48062 Spinal stenosis, lumbar region with neurogenic claudication: Secondary | ICD-10-CM | POA: Diagnosis not present

## 2020-10-04 DIAGNOSIS — M5136 Other intervertebral disc degeneration, lumbar region: Secondary | ICD-10-CM | POA: Insufficient documentation

## 2020-10-04 NOTE — Progress Notes (Signed)
Subjective:    Patient ID: Margaret Owens, female    DOB: [birth year], 50 y.o.   MRN: [id number]  HPI  50 year old female with history of chronic low back pain.  The patient has undergone L3-4-5 lumbar medial branch blocks in 2020 but these were not helpful in alleviating her pain.  She had good results with sacroiliac injections.  More recently over the last couple months, the patient has developed increasing lower extremity pain.  This pain goes into her thigh and down to her ankle on the right side.  It goes down to her buttock and thigh on the left side.  She has not had any weakness in the lower extremities no loss of bowel or bladder function.  She does get occasional numbness in the right heel. The patient underwent MRI of the lumbar spine and is here to review the report.  We also reviewed the images directly.  CLINICAL DATA:  Spinal stenosis.  Lumbosacral.   EXAM: MRI LUMBAR SPINE WITHOUT CONTRAST   TECHNIQUE: Multiplanar, multisequence MR imaging of the lumbar spine was performed. No intravenous contrast was administered.   COMPARISON:  MRI of the lumbar spine July 18, 2016.   FINDINGS: Segmentation:  Standard.   Alignment: Interval progression of anterolisthesis of L4 over L5 now measuring 7 mm (3 mm on prior).   Vertebrae: No fracture, evidence of discitis, or bone lesion. Endplate degenerative changes at L4-5 and L5-S1.   Conus medullaris and cauda equina: Conus extends to the L1 level. Conus and cauda equina appear normal.   Paraspinal and other soft tissues: Negative.   Disc levels:   T12-L1: No spinal canal or neural foraminal stenosis.   L1-2: No spinal canal or neural foraminal stenosis.   L2-3: No spinal canal or neural foraminal stenosis.   L3-4: No spinal canal or neural foraminal stenosis.   L4-5: Loss of disc height, disc bulge/disc uncovering, facet degenerative changes and ligamentum flavum redundancy resulting in mild-to-moderate spinal canal  stenosis with narrowing of the bilateral subarticular zones, severe right and moderate left neural foraminal narrowing. Findings have progressed since prior MRI   L5-S1: Mild loss of disc height, disc bulge with superimpose small central disc protrusion, moderate facet degenerative changes ligamentum flavum redundancy resulting in narrowing of the bilateral subarticular zones, moderate right and severe left neural foraminal narrowing. No significant change from prior MRI.   IMPRESSION: 1. Degenerative changes of the lumbar spine with progressed anterolisthesis at L4-5 where there is mild-to-moderate spinal canal stenosis with narrowing of the bilateral subarticular zones, severe right and moderate left neural foraminal narrowing. 2. Moderate right and severe left neural foraminal narrowing at L5-S1, unchanged.     Electronically Signed   By: Pedro Earls M.D.   On: 09/08/2020 13:57 Pain Inventory Average Pain 10 Pain Right Now 6 My pain is sharp, burning, and aching  In the last 24 hours, has pain interfered with the following? General activity 6 Relation with others 6 Enjoyment of life 6 What TIME of day is your pain at its worst? morning  Sleep (in general) Poor  Pain is worse with: walking, bending, standing, and some activites Pain improves with: heat/ice, therapy/exercise, pacing activities, medication, TENS, and injections Relief from Meds: 6  Family History  Problem Relation Age of Onset   Osteoarthritis Mother    Breast cancer Maternal Grandmother        spread to lungs    Social History   Socioeconomic History   Marital  status: Single    Spouse name: Not on file   Number of children: Not on file   Years of education: Not on file   Highest education level: Not on file  Occupational History   Not on file  Tobacco Use   Smoking status: Never   Smokeless tobacco: Never  Vaping Use   Vaping Use: Never used  Substance and Sexual Activity    Alcohol use: Yes    Comment: 1/week   Drug use: No   Sexual activity: Yes    Partners: Male    Birth control/protection: None  Other Topics Concern   Not on file  Social History Narrative   Not on file   Social Determinants of Health   Financial Resource Strain: Not on file  Food Insecurity: Not on file  Transportation Needs: Not on file  Physical Activity: Not on file  Stress: Not on file  Social Connections: Not on file   Past Surgical History:  Procedure Laterality Date   colonoscopy     Past Surgical History:  Procedure Laterality Date   colonoscopy     Past Medical History:  Diagnosis Date   Anxiety    Arthritis    Asthma    Bursitis    Depression    GERD (gastroesophageal reflux disease)    Hypertension    Muscle spasms of both lower extremities    PTSD (post-traumatic stress disorder)    Sciatica    Seizures (HCC)    Uterine fibroid    BP 122/80   Pulse 98   Temp 99 F (37.2 C)   Ht 5\' 9"  (1.753 m)   Wt 238 lb (108 kg)   SpO2 98%   BMI 35.15 kg/m   Opioid Risk Score:   Fall Risk Score:  `1  Depression screen PHQ 2/9  Depression screen [facility] 2/9 10/04/2020 08/29/2020 07/02/2020 05/16/2020 04/26/2020 01/25/2020 10/05/2018  Decreased Interest 1 1 0 2 0 0 2  Down, Depressed, Hopeless 1 1 0 3 0 0 2  PHQ - 2 Score 2 2 0 5 0 0 4  Altered sleeping - - - 3 - - 2  Tired, decreased energy - - - 2 - - 2  Change in appetite - - - 3 - - 2  Feeling bad or failure about yourself  - - - 3 - - 2  Trouble concentrating - - - 3 - - 2  Moving slowly or fidgety/restless - - - 0 - - 1  Suicidal thoughts - - - 0 - - 2  PHQ-9 Score - - - 19 - - 17  Difficult doing work/chores - - - - - - Very difficult     Review of Systems  Constitutional: Negative.   HENT: Negative.    Eyes: Negative.   Respiratory: Negative.    Cardiovascular: Negative.   Gastrointestinal: Negative.   Endocrine: Negative.   Genitourinary: Negative.   Musculoskeletal:  Positive for back  pain.  Skin: Negative.   Allergic/Immunologic: Negative.   Neurological: Negative.   Hematological: Negative.   Psychiatric/Behavioral:  Positive for dysphoric mood.   All other systems reviewed and are negative.     Objective:   Physical Exam Vitals and nursing note reviewed.  Constitutional:      Appearance: She is obese.  HENT:     Head: Normocephalic and atraumatic.  Eyes:     Extraocular Movements: Extraocular movements intact.     Conjunctiva/sclera: Conjunctivae normal.     Pupils: Pupils  are equal, round, and reactive to light.  Neurological:     Mental Status: She is alert and oriented to person, place, and time.     Cranial Nerves: No dysarthria or facial asymmetry.     Motor: No weakness.     Gait: Gait is intact.     Comments: Negative straight leg raising bilaterally Motor strength is 5/5 bilateral hip flexor knee extensor ankle dorsiflexor plantar flexor Sensation intact pinprick bilateral L2-L3-L4 L5-S1 dermatome distribution  Psychiatric:        Mood and Affect: Mood normal.        Behavior: Behavior normal.          Assessment & Plan:  1.  Radicular pain right L4-5 dermatomal distribution.  This corresponds with MRI findings.  The patient is not on any oral anticoagulants.  Her pain has persisted despite medication management including narcotic analgesics.  PT will be ordered however based on her insurance will only have 1 or 2 visits.  Discussed potential for surgery given that she has L4-5 spondylolisthesis grade 1 which has progressed since 2018.  She does not wish to consider surgical referral at this time. We will therefore set her up with L4-5 transforaminal lumbar epidural steroid injection under fluoroscopic guidance right paramedian approach.

## 2020-10-04 NOTE — Patient Instructions (Signed)
Will do Epidural injection to help with pain going down the RIght leg to Right ankle

## 2020-10-08 ENCOUNTER — Other Ambulatory Visit (HOSPITAL_COMMUNITY): Payer: Self-pay

## 2020-10-09 ENCOUNTER — Other Ambulatory Visit (HOSPITAL_COMMUNITY): Payer: Self-pay

## 2020-10-09 MED ORDER — SUPREP BOWEL PREP KIT 17.5-3.13-1.6 GM/177ML PO SOLN
ORAL | 0 refills | Status: AC
  Filled 2020-10-09: qty 354, 1d supply, fill #0

## 2020-10-10 ENCOUNTER — Other Ambulatory Visit (HOSPITAL_COMMUNITY): Payer: Self-pay

## 2020-11-01 ENCOUNTER — Other Ambulatory Visit (HOSPITAL_COMMUNITY): Payer: Self-pay

## 2020-11-04 ENCOUNTER — Encounter: Payer: Self-pay | Admitting: Internal Medicine

## 2020-11-04 ENCOUNTER — Ambulatory Visit: Payer: Medicaid Other | Attending: Internal Medicine | Admitting: Internal Medicine

## 2020-11-04 ENCOUNTER — Other Ambulatory Visit: Payer: Self-pay

## 2020-11-04 DIAGNOSIS — R1310 Dysphagia, unspecified: Secondary | ICD-10-CM

## 2020-11-04 DIAGNOSIS — E669 Obesity, unspecified: Secondary | ICD-10-CM

## 2020-11-04 DIAGNOSIS — R7303 Prediabetes: Secondary | ICD-10-CM | POA: Diagnosis not present

## 2020-11-04 DIAGNOSIS — J4530 Mild persistent asthma, uncomplicated: Secondary | ICD-10-CM

## 2020-11-04 DIAGNOSIS — N951 Menopausal and female climacteric states: Secondary | ICD-10-CM

## 2020-11-04 NOTE — Progress Notes (Signed)
Virtual Visit via Telephone Note  I connected with Margaret Owens on 11/04/2020 at 2:16 p.m by telephone and verified that I am speaking with the correct person using two identifiers  Location: Patient: in a parking lot Provider: office  Participants: Myself Patient   I discussed the limitations, risks, security and privacy concerns of performing an evaluation and management service by telephone and the availability of in person appointments. I also discussed with the patient that there may be a patient responsible charge related to this service. The patient expressed understanding and agreed to proceed.   History of Present Illness: Patient with history of osteoarthritis of the left knee, obesity, prediabetes, uterine fibroid, mild persistent asthma, anxiety/depression followed by [facility].  Last seen 05/2020.  Today's visit is for chronic disease management.  Asthma: on last visit, I recommended using the Symbicort BID instead of QOD.  She has been doing that especially since it has been so humid outside.  Uses Albuterol occasionally. She does not smoke.  She feels her asthma is under good control on the Symbicort. -she has not receive COVID-19 vaccines as yet.  Reports she is taking precautions with wearing mask when in public. States she plans to get the vaccine.  Obesity/PreDM: still on Phentermine and Topamax through [facility].  Reports her wgh has plateau at 240 lbs. when I saw her last, she was at 250 pounds.  Appetite decrease with the Topamax.  Uses stretch bands for exercise  Reports having had c-scope and endoscopy at [facility] earlier this mth on the 7th and 12th.  Reports c-scope was nl but EGD showed area of narrow esophagus.  No dilation done.  No problems swallowing solids or liquids just with swallowing her pills every once and a while.  Had vomiting x 3 last Friday which was 4 days ago.  Reports she has appt next mth with GI to discuss options.    Saw Dr. Read Drivers last  mth for f/u radicular LBP.  Had updated MRI lumbar spine that revealed: 1. Degenerative changes of the lumbar spine with progressed anterolisthesis at L4-5 where there is mild-to-moderate spinal canal stenosis with narrowing of the bilateral subarticular zones, severe right and moderate left neural foraminal narrowing. 2. Moderate right and severe left neural foraminal narrowing at L5-S1, unchanged. Pt states he plans to do ESI next mth.   Reports menses are becoming less frequent.  Regular up until April of this yr. Outpatient Encounter Medications as of 11/04/2020  Medication Sig   albuterol (VENTOLIN HFA) 108 (90 Base) MCG/ACT inhaler Inhale 2 puffs into the lungs every 6 (six) hours as needed for wheezing (wheezing).   budesonide-formoterol (SYMBICORT) 160-4.5 MCG/ACT inhaler 2 puffs twice daily to control asthma   buprenorphine (BUTRANS) 7.5 MCG/HR Apply 1 (one) Patch on skin weekly   cetirizine (ZYRTEC) 10 MG tablet Take 1 tablet (10 mg total) by mouth daily.   Cholecalciferol (VITAMIN D3) 50 MCG (2000 UT) capsule SMARTSIG:By Mouth   cyclobenzaprine (FLEXERIL) 5 MG tablet Take 1 tablet (5 mg total) by mouth 3 (three) times daily as needed for muscle spasms.   cycloSPORINE (RESTASIS) 0.05 % ophthalmic emulsion INSTILL 1 DROP INTO BOTH EYES TWICE A DAY   desonide (DESOWEN) 0.05 % cream Apply topically 2 (two) times daily as needed.   diclofenac sodium (VOLTAREN) 1 % GEL Apply 4 grams every 6 hours (or 4 times per day) to the knees if needed for pain   dicyclomine (BENTYL) 10 MG capsule TAKE 1 CAPSULE  BY MOUTH 3 TIMES DAILY AS NEEDED FOR ABDOMINAL CRAMPS/GAS   DULoxetine (CYMBALTA) 30 MG capsule Take 30 mg by mouth 2 (two) times daily.   ferrous sulfate 324 (65 Fe) MG TBEC Take 1 tablet by mouth daily   fluticasone (FLONASE) 50 MCG/ACT nasal spray Place 2 sprays into both nostrils daily.   furosemide (LASIX) 20 MG tablet Take 20 mg by mouth daily.   furosemide (LASIX) 20 MG tablet TAKE 1  TABLET BY MOUTH EVERY DAY AS NEEDED FOR LEG SWELLING   gabapentin (NEURONTIN) 300 MG capsule Take 1 (one) Capsule by mouth four times daily, as needed   LINZESS 290 MCG CAPS capsule Take 290 mcg by mouth daily.   MAG-G 500 (27 Mg) MG TABS Take by mouth.   Magnesium Gluconate 500 (27 Mg) MG TABS TAKE 1 TABLET BY MOUTH DAILY, TO SUPPORT HEART AND BRAIN AND EASE PAIN   meloxicam (MOBIC) 15 MG tablet Take 1 tablet (15 mg total) by mouth daily. As needed for pain. Take after eating   metFORMIN (GLUCOPHAGE) 500 MG tablet TAKE 1 TABLET BY MOUTH ONCE DAILY IN EVENING   methocarbamol (ROBAXIN) 500 MG tablet Take 1 tablet (500 mg total) by mouth 2 (two) times daily.   Na Sulfate-K Sulfate-Mg Sulf (SUPREP BOWEL PREP KIT) 17.5-3.13-1.6 GM/177ML SOLN Take 354 Milliliter as directed on prep sheet   naloxone (NARCAN) nasal spray 4 mg/0.1 mL Call 911, Place 1 spray into one nostril. Repeat every 3 minutes as needed if no or minimal response.   naloxone (NARCAN) nasal spray 4 mg/0.1 mL USE 1 (ONE) SPRAY BY NOSE AS DIRECTED   naproxen (NAPROSYN) 500 MG tablet Take 1 tablet (500 mg total) by mouth 2 (two) times daily.   NARCAN 4 MG/0.1ML LIQD nasal spray kit 1 spray once.   ondansetron (ZOFRAN ODT) 4 MG disintegrating tablet Take 1 tablet (4 mg total) by mouth every 8 (eight) hours as needed.   oxyCODONE-acetaminophen (PERCOCET) 10-325 MG tablet Take 1 (one) Tablet by mouth four times daily, as needed   oxyCODONE-acetaminophen (PERCOCET) 10-325 MG tablet TAKE 1 TABLET BY MOUTH 4 TIMES DAILY, AS NEEDED   pantoprazole (PROTONIX) 40 MG tablet Take 1 tablet (40 mg total) by mouth 2 (two) times daily.   pantoprazole (PROTONIX) 40 MG tablet TAKE 1 TABLET BY MOUTH ONCE A DAY   phentermine (ADIPEX-P) 37.5 MG tablet Take 37.5 mg by mouth at bedtime.   phentermine 37.5 MG capsule Take 1 (one) Capsule by mouth daily   polyethylene glycol (MIRALAX) 17 g packet Take 17 g by mouth daily.   PROAIR HFA 108 (90 Base) MCG/ACT  inhaler INHALE 2 PUFFS INTO THE LUNGS 4 TIMES DAILY AS NEEDED   topiramate (TOPAMAX) 100 MG tablet Take 1 tablet by mouth daily   topiramate (TOPAMAX) 25 MG tablet Take 25 mg by mouth daily.   traZODone (DESYREL) 50 MG tablet Take 50 mg by mouth at bedtime as needed for sleep (sleep).    Vitamin D, Ergocalciferol, (DRISDOL) 1.25 MG (50000 UNIT) CAPS capsule Take 50,000 Units by mouth once a week.   Vitamin D, Ergocalciferol, (DRISDOL) 1.25 MG (50000 UNIT) CAPS capsule Take 1 (one) Capsule by mouth weekly   Vitamin D, Ergocalciferol, (DRISDOL) 1.25 MG (50000 UNIT) CAPS capsule Take 1 (one) Capsule by mouth weekly   No facility-administered encounter medications on file as of 11/04/2020.      Observations/Objective: Results for orders placed or performed in visit on 05/17/20  Lipid panel  Result Value  Ref Range   Cholesterol 181 <200 mg/dL   HDL 53 > OR = 50 mg/dL   Triglycerides 91 <150 mg/dL   LDL Cholesterol (Calc) 109 (H) mg/dL (calc)   Total CHOL/HDL Ratio 3.4 <5.0 (calc)   Non-HDL Cholesterol (Calc) 128 <130 mg/dL (calc)  COMPLETE METABOLIC PANEL WITH GFR  Result Value Ref Range   Glucose, Bld 107 (H) 65 - 99 mg/dL   BUN 8 7 - 25 mg/dL   Creat 0.68 0.50 - 1.05 mg/dL   GFR, Est Non African American 102 > OR = 60 mL/min/1.34m   GFR, Est African American 118 > OR = 60 mL/min/1.754m  BUN/Creatinine Ratio NOT APPLICABLE 6 - 22 (calc)   Sodium 140 135 - 146 mmol/L   Potassium 4.1 3.5 - 5.3 mmol/L   Chloride 106 98 - 110 mmol/L   CO2 23 20 - 32 mmol/L   Calcium 9.1 8.6 - 10.4 mg/dL   Total Protein 7.1 6.1 - 8.1 g/dL   Albumin 3.9 3.6 - 5.1 g/dL   Globulin 3.2 1.9 - 3.7 g/dL (calc)   AG Ratio 1.2 1.0 - 2.5 (calc)   Total Bilirubin 0.3 0.2 - 1.2 mg/dL   Alkaline phosphatase (APISO) 68 37 - 153 U/L   AST 12 10 - 35 U/L   ALT 10 6 - 29 U/L  VITAMIN D 25 Hydroxy (Vit-D Deficiency, Fractures)  Result Value Ref Range   Vit D, 25-Hydroxy 26 (L) 30 - 100 ng/mL  TSH  Result Value  Ref Range   TSH 2.25 mIU/L  CBC  Result Value Ref Range   WBC 7.9 3.8 - 10.8 Thousand/uL   RBC 4.70 3.80 - 5.10 Million/uL   Hemoglobin 12.5 11.7 - 15.5 g/dL   HCT 38.1 35.0 - 45.0 %   MCV 81.1 80.0 - 100.0 fL   MCH 26.6 (L) 27.0 - 33.0 pg   MCHC 32.8 32.0 - 36.0 g/dL   RDW 14.6 11.0 - 15.0 %   Platelets 498 (H) 140 - 400 Thousand/uL   MPV 9.9 7.5 - 12.5 fL     Assessment and Plan: 1. Mild persistent asthma without complication Controlled on current inhalers.  Strongly advised her to get the COVID-19 vaccine.  2. Obesity (BMI 35.0-39.9 without comorbidity) 3. Prediabetes Commended her on trying to control her eating habits and trying to move as much as her back will allow.  [facility].  4. Pill dysphagia Patient has follow-up with GI next month. Advised that she request they send me a copy of the colonoscopy and endoscopy reports.  5. Perimenopausal Infrequent menses likely due to being perimenopausal.   Follow Up Instructions: 4 mths Appt with pharmacist in 1 wk for Pneumovax 23   I discussed the assessment and treatment plan with the patient. The patient was provided an opportunity to ask questions and all were answered. The patient agreed with the plan and demonstrated an understanding of the instructions.   The patient was advised to call back or seek an in-person evaluation if the symptoms worsen or if the condition fails to improve as anticipated.  I  Spent 22 minutes on this telephone encounter  DeKarle PlumberMD

## 2020-11-07 ENCOUNTER — Telehealth: Payer: Self-pay | Admitting: Internal Medicine

## 2020-11-07 NOTE — Telephone Encounter (Signed)
-----   Message from Ladell Pier, MD sent at 11/04/2020  2:40 PM EDT ----- F/u in 4 mths Give appt with [facility] for Pneumonia vaccine in 1 wk

## 2020-11-07 NOTE — Telephone Encounter (Signed)
Called patient no answer and voicemail not set up. If patient returns call she will need to be scheduled for a 4 month f/u with Dr. Wynetta Emery around November and scheduled with [facility] for Pneumonia vaccine

## 2020-11-14 ENCOUNTER — Other Ambulatory Visit (HOSPITAL_COMMUNITY): Payer: Self-pay

## 2020-11-14 ENCOUNTER — Encounter: Payer: Medicaid Other | Admitting: Physical Medicine & Rehabilitation

## 2020-11-14 ENCOUNTER — Other Ambulatory Visit: Payer: Self-pay

## 2020-11-14 VITALS — BP 125/80 | Temp 98.4°F | Ht 69.0 in | Wt 243.2 lb

## 2020-11-18 ENCOUNTER — Other Ambulatory Visit (HOSPITAL_COMMUNITY): Payer: Self-pay

## 2020-11-19 ENCOUNTER — Other Ambulatory Visit (HOSPITAL_COMMUNITY): Payer: Self-pay

## 2020-11-21 ENCOUNTER — Other Ambulatory Visit (HOSPITAL_COMMUNITY): Payer: Self-pay

## 2020-11-27 ENCOUNTER — Other Ambulatory Visit (HOSPITAL_COMMUNITY): Payer: Self-pay

## 2020-11-28 ENCOUNTER — Other Ambulatory Visit (HOSPITAL_COMMUNITY): Payer: Self-pay

## 2020-11-28 MED ORDER — OXYCODONE-ACETAMINOPHEN 10-325 MG PO TABS
1.0000 | ORAL_TABLET | Freq: Four times a day (QID) | ORAL | 0 refills | Status: AC | PRN
  Filled 2020-11-28 – 2021-01-20 (×4): qty 120, 30d supply, fill #0

## 2020-11-28 MED ORDER — BUPRENORPHINE 7.5 MCG/HR TD PTWK
1.0000 | MEDICATED_PATCH | TRANSDERMAL | 0 refills | Status: AC
  Filled 2020-11-28: qty 4, 28d supply, fill #0

## 2020-12-06 ENCOUNTER — Other Ambulatory Visit (HOSPITAL_COMMUNITY): Payer: Self-pay

## 2020-12-15 IMAGING — MG DIGITAL SCREENING BILAT W/ TOMO W/ CAD
6 of 10 series · 6 of 30 positions shown · non-contrast
Comparison: Previous exam(s).

CLINICAL DATA: Screening.

EXAM:
DIGITAL SCREENING BILATERAL MAMMOGRAM WITH TOMO AND CAD

[R MLO synth-2D]
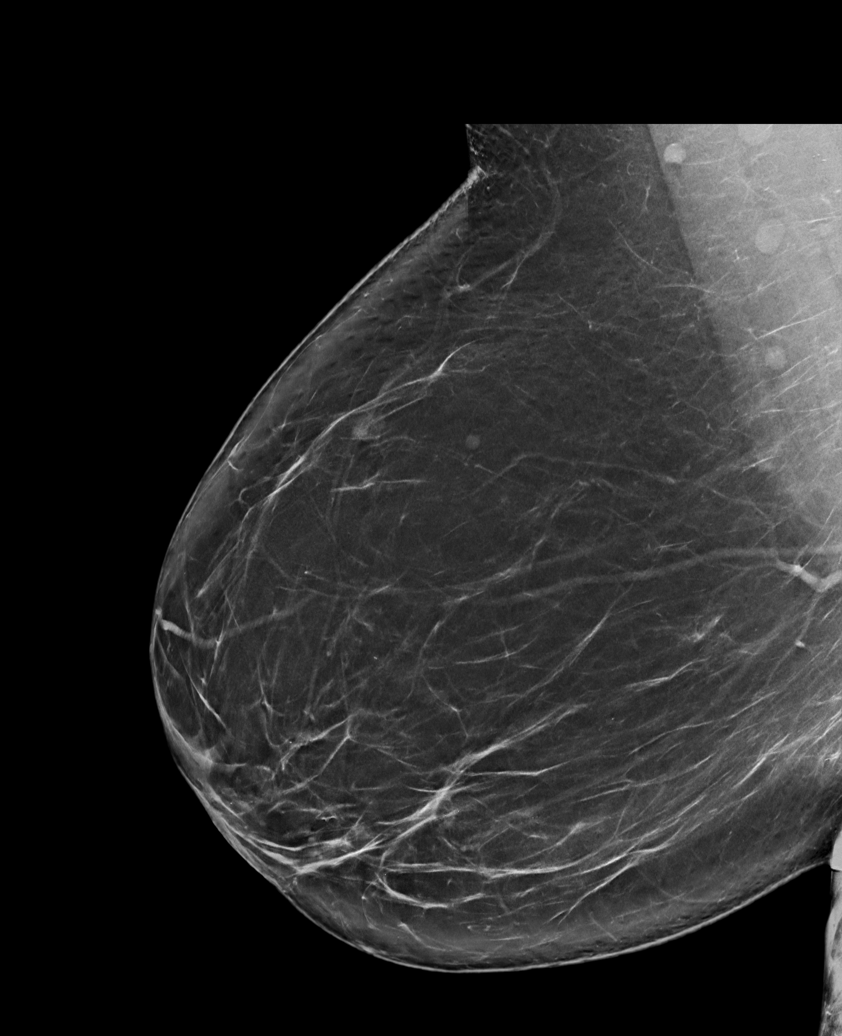

[L CC synth-2D]
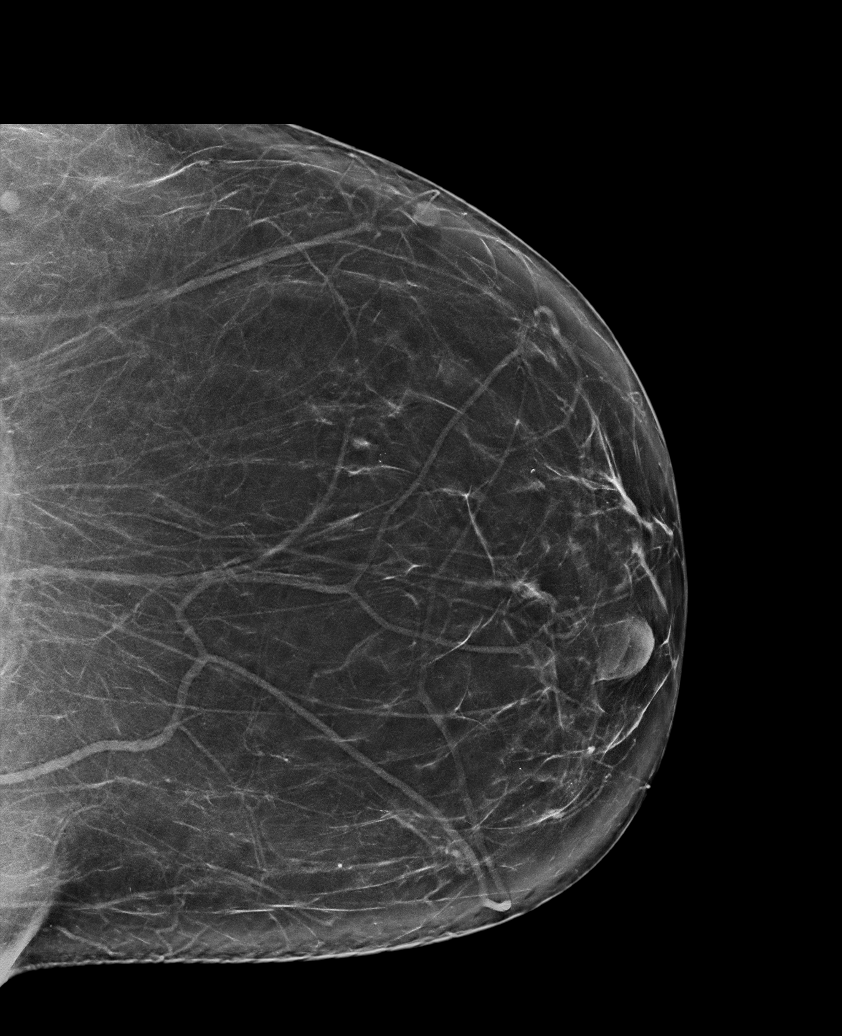

[R CC synth-2D]
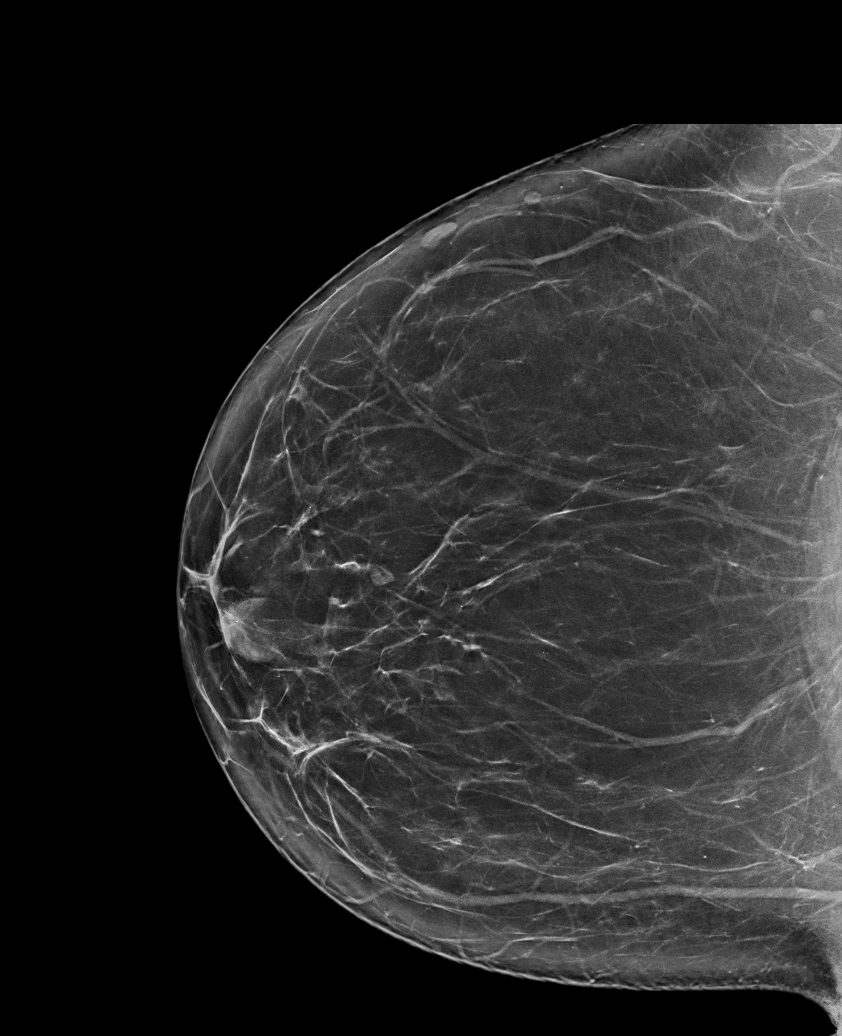

[L MLO synth-2D (1 of 2)]
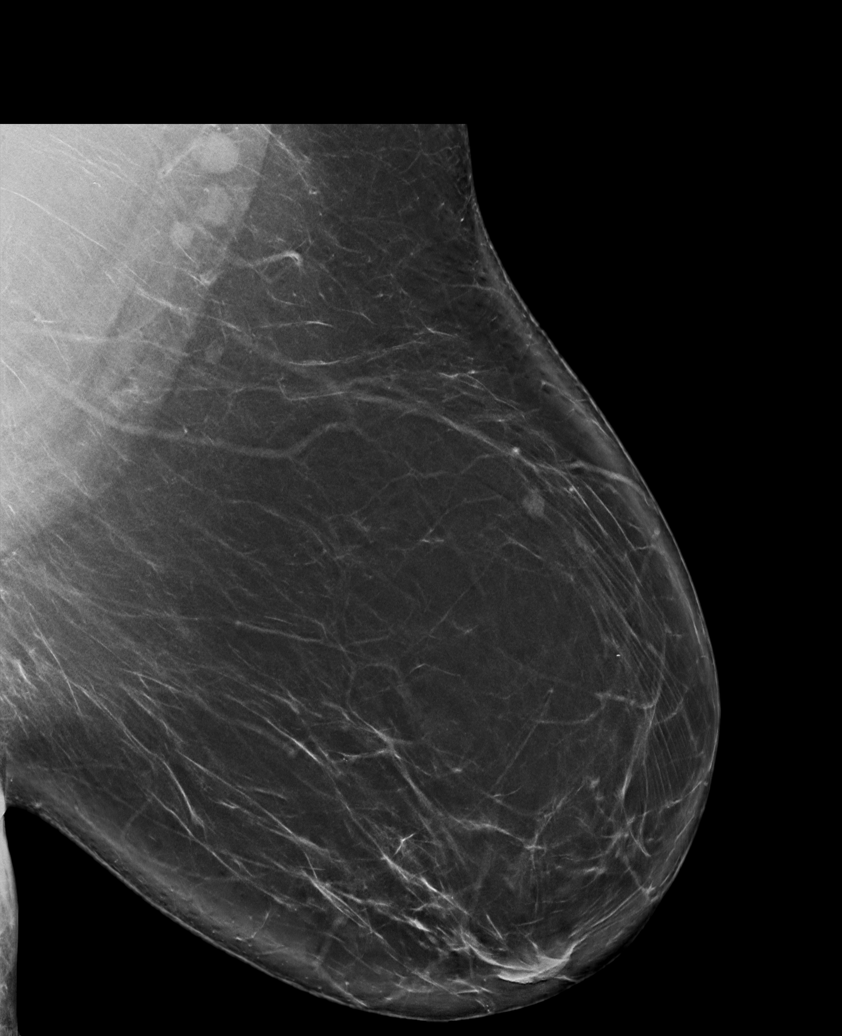

[L MLO synth-2D (2 of 2)]
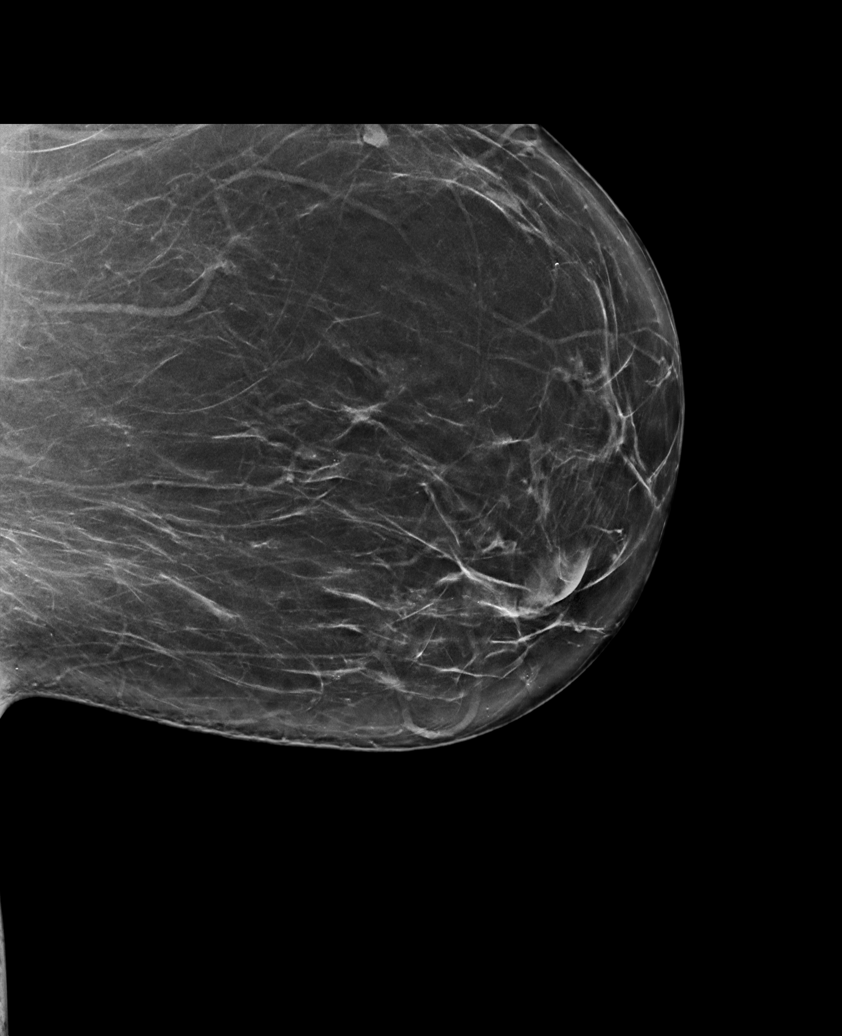

[L MLO tomo · tomo slice 53/104.0]
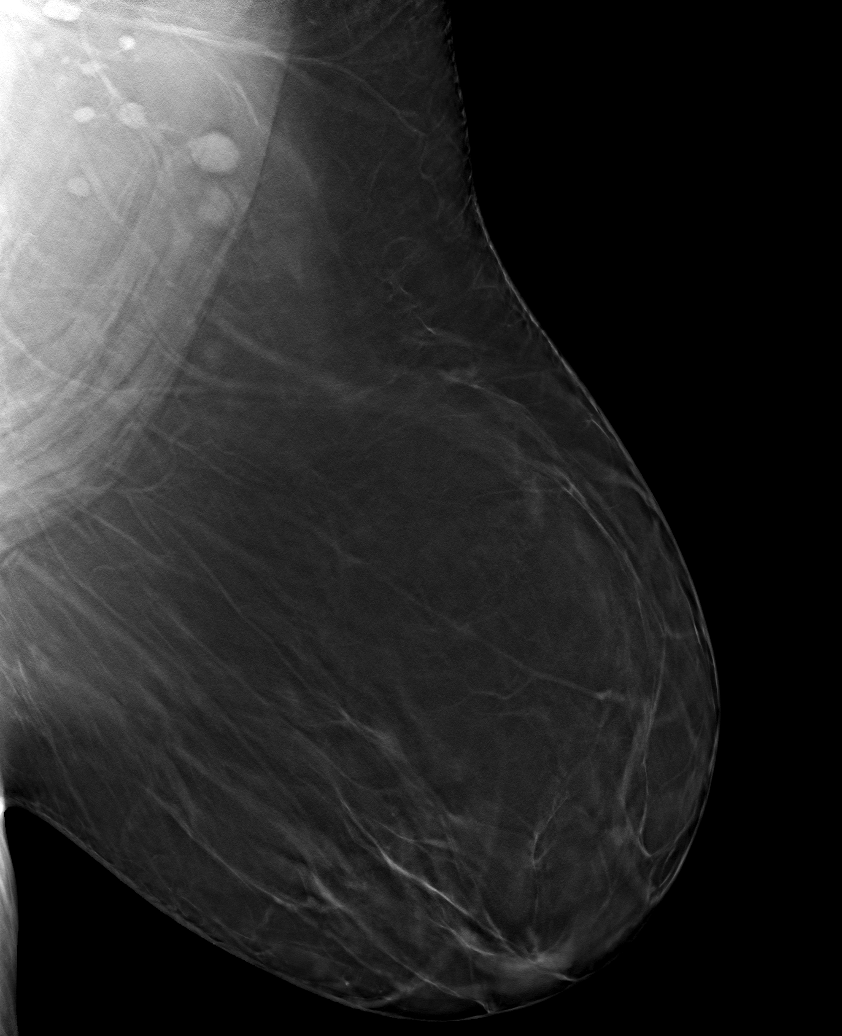

[6 of 30 positions shown; findings below may reference images not displayed]

ACR Breast Density Category b: There are scattered areas of
fibroglandular density.
FINDINGS: There are no findings suspicious for malignancy. Images were
processed with CAD.
IMPRESSION: No mammographic evidence of malignancy. A result letter of this
screening mammogram will be mailed directly to the patient.

RECOMMENDATION:
Screening mammogram in one year. (Code:[id number])

BI-RADS CATEGORY  1: Negative.

## 2020-12-18 ENCOUNTER — Other Ambulatory Visit (HOSPITAL_COMMUNITY): Payer: Self-pay

## 2020-12-25 ENCOUNTER — Other Ambulatory Visit (HOSPITAL_COMMUNITY): Payer: Self-pay

## 2020-12-27 ENCOUNTER — Other Ambulatory Visit (HOSPITAL_COMMUNITY): Payer: Self-pay

## 2021-01-01 ENCOUNTER — Other Ambulatory Visit (HOSPITAL_COMMUNITY): Payer: Self-pay

## 2021-01-03 ENCOUNTER — Other Ambulatory Visit: Payer: Self-pay

## 2021-01-03 ENCOUNTER — Encounter (HOSPITAL_BASED_OUTPATIENT_CLINIC_OR_DEPARTMENT_OTHER): Payer: Self-pay | Admitting: *Deleted

## 2021-01-03 ENCOUNTER — Emergency Department (HOSPITAL_BASED_OUTPATIENT_CLINIC_OR_DEPARTMENT_OTHER)
Admission: EM | Admit: 2021-01-03 | Discharge: 2021-01-03 | Disposition: A | Discharge status: Home / Self Care | Payer: Medicaid Other | Attending: Emergency Medicine | Admitting: Emergency Medicine

## 2021-01-03 DIAGNOSIS — R21 Rash and other nonspecific skin eruption: Secondary | ICD-10-CM | POA: Diagnosis not present

## 2021-01-03 DIAGNOSIS — Z7984 Long term (current) use of oral hypoglycemic drugs: Secondary | ICD-10-CM | POA: Diagnosis not present

## 2021-01-03 DIAGNOSIS — Z91040 Latex allergy status: Secondary | ICD-10-CM | POA: Diagnosis not present

## 2021-01-03 DIAGNOSIS — J45909 Unspecified asthma, uncomplicated: Secondary | ICD-10-CM | POA: Insufficient documentation

## 2021-01-03 DIAGNOSIS — Z8616 Personal history of COVID-19: Secondary | ICD-10-CM | POA: Diagnosis not present

## 2021-01-03 DIAGNOSIS — R109 Unspecified abdominal pain: Secondary | ICD-10-CM | POA: Insufficient documentation

## 2021-01-03 DIAGNOSIS — Z7952 Long term (current) use of systemic steroids: Secondary | ICD-10-CM | POA: Diagnosis not present

## 2021-01-03 DIAGNOSIS — I10 Essential (primary) hypertension: Secondary | ICD-10-CM | POA: Diagnosis not present

## 2021-01-03 MED ORDER — HYDROXYZINE HCL 25 MG PO TABS: 25.0000 mg | ORAL_TABLET | Freq: Three times a day (TID) | ORAL | 0 refills | Status: AC | PRN

## 2021-01-03 NOTE — ED Notes (Signed)
ED Provider at bedside. 

## 2021-01-03 NOTE — ED Provider Notes (Signed)
[facility] DEPARTMENT Provider Note   CSN: [id number] Arrival date & time: 01/03/21  0033     History Chief Complaint  Patient presents with   Rash    Margaret Owens is a 50 y.o. female.  The history is provided by the patient.  Rash Margaret Owens is a 50 y.o. female who presents to the Emergency Department complaining of rash. She complains of an itchy generalized water rash that started about one week ago. It is located on her trunk and extremities. She states it is not getting better but has not significantly worsened. She does take cetirizine for allergies at baseline. She does have itchy eyes, but feels that that might be related to the season.  No new detergents, lotions, soaps.  Lives with granddaughter.   No new foods.      Past Medical History:  Diagnosis Date   Anxiety    Arthritis    Asthma    Bursitis    Depression    GERD (gastroesophageal reflux disease)    Hypertension    Muscle spasms of both lower extremities    PTSD (post-traumatic stress disorder)    Sciatica    Seizures ([place])    Uterine fibroid     Patient Active Problem List   Diagnosis Date Noted   COVID-19 vaccination declined 05/16/2020   Prediabetes 05/16/2020   Obesity (BMI 35.0-39.9 without comorbidity) 05/16/2020   Primary osteoarthritis of left knee 07/04/2018   Uterine fibroid 06/09/2017   Endometrial stromal nodule 06/09/2017    Past Surgical History:  Procedure Laterality Date   colonoscopy       OB History     Gravida  3   Para  3   Term  3   Preterm      AB      Living  3      SAB  0   IAB  0   Ectopic  0   Multiple  0   Live Births  3           Family History  Problem Relation Age of Onset   Osteoarthritis Mother    Breast cancer Maternal Grandmother        spread to lungs     Social History   Tobacco Use   Smoking status: Never   Smokeless tobacco: Never  Vaping Use   Vaping Use: Never used  Substance Use  Topics   Alcohol use: Yes    Comment: 1/week   Drug use: No    Home Medications Prior to Admission medications   Medication Sig Start Date End Date Taking? Authorizing Provider  hydrOXYzine (ATARAX/VISTARIL) 25 MG tablet Take 1 tablet (25 mg total) by mouth every 8 (eight) hours as needed. 01/03/21  Yes Quintella Reichert, MD  albuterol (VENTOLIN HFA) 108 (90 Base) MCG/ACT inhaler Inhale 2 puffs into the lungs every 6 (six) hours as needed for wheezing (wheezing). 10/05/18   Fulp, Ander Gaster, MD  budesonide-formoterol (SYMBICORT) 160-4.5 MCG/ACT inhaler 2 puffs twice daily to control asthma 10/05/18   Fulp, Cammie, MD  buprenorphine (BUTRANS) 7.5 MCG/HR Place 1 patch onto the skin once a week. 11/28/20     cetirizine (ZYRTEC) 10 MG tablet Take 1 tablet (10 mg total) by mouth daily. 10/05/18   Fulp, Cammie, MD  Cholecalciferol (VITAMIN D3) 50 MCG (2000 UT) capsule SMARTSIG:By Mouth 04/11/20   [provider]  cyclobenzaprine (FLEXERIL) 5 MG tablet Take 1 tablet (5 mg total) by mouth  3 (three) times daily as needed for muscle spasms. 10/05/18   Fulp, Cammie, MD  cycloSPORINE (RESTASIS) 0.05 % ophthalmic emulsion INSTILL 1 DROP INTO BOTH EYES TWICE A DAY 05/16/20 05/16/21  STEVEN BERSTORF  desonide (DESOWEN) 0.05 % cream Apply topically 2 (two) times daily as needed. 07/15/20   Nolene Ebbs, MD  diclofenac sodium (VOLTAREN) 1 % GEL Apply 4 grams every 6 hours (or 4 times per day) to the knees if needed for pain 09/10/18   Drenda Freeze, MD  dicyclomine (BENTYL) 10 MG capsule TAKE 1 CAPSULE BY MOUTH 3 TIMES DAILY AS NEEDED FOR ABDOMINAL CRAMPS/GAS 07/20/19 07/19/20  Nolene Ebbs, MD  DULoxetine (CYMBALTA) 30 MG capsule Take 30 mg by mouth 2 (two) times daily.    [provider]  ferrous sulfate 324 (65 Fe) MG TBEC Take 1 tablet by mouth daily 09/26/20     fluticasone (FLONASE) 50 MCG/ACT nasal spray Place 2 sprays into both nostrils daily. 12/17/17   Fulp, Cammie, MD  furosemide (LASIX) 20 MG  tablet Take 20 mg by mouth daily. 01/09/20   [provider]  furosemide (LASIX) 20 MG tablet TAKE 1 TABLET BY MOUTH EVERY DAY AS NEEDED FOR LEG SWELLING 09/07/19 09/06/20  Nolene Ebbs, MD  gabapentin (NEURONTIN) 300 MG capsule Take 1 (one) Capsule by mouth four times daily, as needed 10/02/20     LINZESS 290 MCG CAPS capsule Take 290 mcg by mouth daily. 08/03/19   [provider]  MAG-G 500 (27 Mg) MG TABS Take by mouth. 04/11/20   [provider]  meloxicam (MOBIC) 15 MG tablet Take 1 tablet (15 mg total) by mouth daily. As needed for pain. Take after eating 10/05/18   Fulp, Cammie, MD  metFORMIN (GLUCOPHAGE) 500 MG tablet TAKE 1 TABLET BY MOUTH ONCE DAILY IN [facility] 04/01/20 04/01/21  Simona Huh, NP  methocarbamol (ROBAXIN) 500 MG tablet Take 1 tablet (500 mg total) by mouth 2 (two) times daily. 08/15/19   Garald Balding, PA-C  Na Sulfate-K Sulfate-Mg Sulf (SUPREP BOWEL PREP KIT) 17.5-3.13-1.6 GM/177ML SOLN Take 354 Milliliter as directed on prep sheet 10/09/20     naloxone (NARCAN) nasal spray 4 mg/0.1 mL Call 911, Place 1 spray into one nostril. Repeat every 3 minutes as needed if no or minimal response. 10/03/20     naproxen (NAPROSYN) 500 MG tablet Take 1 tablet (500 mg total) by mouth 2 (two) times daily. 08/15/19   Garald Balding, PA-C  NARCAN 4 MG/0.1ML LIQD nasal spray kit 1 spray once. 02/06/20   [provider]  ondansetron (ZOFRAN ODT) 4 MG disintegrating tablet Take 1 tablet (4 mg total) by mouth every 8 (eight) hours as needed. 02/24/20   Isla Pence, MD  oxyCODONE-acetaminophen (PERCOCET) 10-325 MG tablet Take 1 (one) Tablet by mouth four times daily, as needed 10/02/20     oxyCODONE-acetaminophen (PERCOCET) 10-325 MG tablet Take 1 tablet by mouth 4 (four) times daily as needed. 11/28/20     oxyCODONE-acetaminophen (PERCOCET) 10-325 MG tablet TAKE 1 TABLET BY MOUTH 4 TIMES DAILY, AS NEEDED 07/08/20 01/04/21    pantoprazole (PROTONIX) 40 MG tablet Take  1 tablet (40 mg total) by mouth 2 (two) times daily. 10/05/18   Fulp, Cammie, MD  pantoprazole (PROTONIX) 40 MG tablet TAKE 1 TABLET BY MOUTH ONCE A DAY 05/17/20 05/17/21  Nolene Ebbs, MD  phentermine (ADIPEX-P) 37.5 MG tablet Take 37.5 mg by mouth at bedtime. 01/09/20   [provider]  phentermine 37.5 MG capsule  Take 1 (one) Capsule by mouth daily 09/26/20     polyethylene glycol (MIRALAX) 17 g packet Take 17 g by mouth daily. 03/28/19   Fransico Meadow, PA-C  PROAIR HFA 108 [phone] Base) MCG/ACT inhaler INHALE 2 PUFFS INTO THE LUNGS 4 TIMES DAILY AS NEEDED 05/17/20 05/17/21  Nolene Ebbs, MD  topiramate (TOPAMAX) 100 MG tablet Take 1 tablet by mouth daily 09/26/20     topiramate (TOPAMAX) 25 MG tablet Take 25 mg by mouth daily. 04/01/20   [provider]  traZODone (DESYREL) 50 MG tablet Take 50 mg by mouth at bedtime as needed for sleep (sleep).     [provider]  Vitamin D, Ergocalciferol, (DRISDOL) 1.25 MG (50000 UNIT) CAPS capsule Take 50,000 Units by mouth once a week. 01/09/20   [provider]  Vitamin D, Ergocalciferol, (DRISDOL) 1.25 MG (50000 UNIT) CAPS capsule Take 1 (one) Capsule by mouth weekly 09/26/20       Allergies    Latex and Penicillins  Review of Systems   Review of Systems  Skin:  Positive for rash.  All other systems reviewed and are negative.  Physical Exam Updated Vital Signs BP 123/70 (BP Location: Right Arm)   Pulse 74   Temp (!) 97.5 F (36.4 C) (Oral)   Resp 16   Ht '5\' 9"'  (1.753 m)   Wt 111.6 kg   LMP 12/30/2020   SpO2 100%   BMI 36.33 kg/m   Physical Exam Vitals and nursing note reviewed.  Constitutional:      Appearance: She is well-developed.  HENT:     Head: Normocephalic and atraumatic.  Cardiovascular:     Rate and Rhythm: Normal rate and regular rhythm.  Pulmonary:     Effort: Pulmonary effort is normal. No respiratory distress.  Abdominal:     Tenderness: There is abdominal tenderness. There is rebound.   Musculoskeletal:        General: No tenderness.  Skin:    General: Skin is warm and dry.     Comments: Scatter papular rash...  Neurological:     Mental Status: She is alert and oriented to person, place, and time.  Psychiatric:        Behavior: Behavior normal.    ED Results / Procedures / Treatments   Labs (all labs ordered are listed, but only abnormal results are displayed) Labs Reviewed - No data to display  EKG None  Radiology No results found.  Procedures Procedures   Medications Ordered in ED Medications - No data to display  ED Course  I have reviewed the triage vital signs and the nursing notes.  Pertinent labs & imaging results that were available during my care of the patient were reviewed by me and considered in my medical decision making (see chart for details).    MDM Rules/Calculators/A&P                          patient here for evaluation of rash for one week. On evaluation she is non-toxic appearing. She does have a few scattered papules to the trunk, extremities. There are no vesicular lesions. No evidence of cellulitis, abscess. Exam is not consistent with monkeypox or chickenpox. Discussed with patient unclear source of her rash. Will prescribe hydroxyzine for itching. Discussed outpatient follow-up and return precautions.  Final Clinical Impression(s) / ED Diagnoses Final diagnoses:  Rash    Rx / DC Orders ED Discharge Orders  Ordered    hydrOXYzine (ATARAX/VISTARIL) 25 MG tablet  Every 8 hours PRN        01/03/21 0323             Quintella Reichert, MD 01/03/21 [id number]

## 2021-01-03 NOTE — ED Triage Notes (Signed)
C/o fine rash to entire body x " several day"

## 2021-01-20 ENCOUNTER — Other Ambulatory Visit (HOSPITAL_COMMUNITY): Payer: Self-pay

## 2021-01-21 ENCOUNTER — Other Ambulatory Visit (HOSPITAL_COMMUNITY): Payer: Self-pay

## 2021-01-22 ENCOUNTER — Other Ambulatory Visit (HOSPITAL_COMMUNITY): Payer: Self-pay

## 2021-01-23 ENCOUNTER — Other Ambulatory Visit: Payer: Self-pay

## 2021-01-23 ENCOUNTER — Encounter: Payer: Medicaid Other | Attending: Physical Medicine & Rehabilitation | Admitting: Physical Medicine & Rehabilitation

## 2021-01-23 ENCOUNTER — Encounter: Payer: Self-pay | Admitting: Physical Medicine & Rehabilitation

## 2021-01-23 VITALS — BP 133/83 | HR 97 | Temp 98.7°F | Ht 69.0 in | Wt 240.0 lb

## 2021-01-23 DIAGNOSIS — M48061 Spinal stenosis, lumbar region without neurogenic claudication: Secondary | ICD-10-CM | POA: Diagnosis not present

## 2021-01-23 DIAGNOSIS — M5416 Radiculopathy, lumbar region: Secondary | ICD-10-CM | POA: Diagnosis present

## 2021-01-23 NOTE — Progress Notes (Signed)
  PROCEDURE RECORD [facility]   Name: Margaret Owens DOB:October 30, 1970 MRN: [id number]  Date:01/23/2021  Physician: Alysia Penna, MD    Nurse/CMA:  Allergies:  Allergies  Allergen Reactions   Latex Hives   Penicillins Hives    Has patient had a PCN reaction causing immediate rash, facial/tongue/throat swelling, SOB or lightheadedness with hypotension: No Has patient had a PCN reaction causing severe rash involving mucus membranes or skin necrosis: No Has patient had a PCN reaction that required hospitalization No Has patient had a PCN reaction occurring within the last 10 years: No If all of the above answers are "NO", then may proceed with Cephalosporin use. Has patient had a PCN reaction causing immediate rash, facial/tongue/throat swelling, SOB or lightheadedness with hypotension: No Has patient had a PCN reaction causing severe rash involving mucus membranes or skin necrosis: No Has patient had a PCN reaction that required hospitalization No Has patient had a PCN reaction occurring within the last 10 years: No If all of the above answers are "NO", then may proceed with Cephalosporin use.    Consent Signed: Yes.    Is patient diabetic? No.  CBG today? N/A  Pregnant: Yes.   LMP: Patient's last menstrual period was 12/30/2020. (age 49-55)  Anticoagulants: no Anti-inflammatory: no Antibiotics: no  Procedure: RIght L4-5 Translaminar ESI  Position: Prone Start Time: 2:43 pm  End Time: 3:00 pm  Fluoro Time: 48 RN/CMA Marvis Moeller, CMA    Time 2:01 3:03pm    BP 133/83 138/84    Pulse 93 81    Respirations 16 16    O2 Sat 98 98    S/S 6 6    Pain Level 10/10 9/10     D/C home with Son, patient A & O X 3, D/C instructions reviewed, and sits independently.

## 2021-01-23 NOTE — Progress Notes (Signed)
RIght L4-5 translaminar Lumbar epidural steroid injection under fluoroscopic guidance  Indication: Lumbosacral radiculitis is not relieved by medication management or other conservative care and interfering with self-care and mobility.  No  anticoagulant use.  Informed consent was obtained after describing risk and benefits of the procedure with the patient, this includes bleeding, bruising, infection, paralysis and medication side effects.  The patient wishes to proceed and has given written consent.  Patient was placed in a prone position.  The lumbar area was marked and prepped with Betadine.  It was entered with a 25-gauge 1-1/2 inch needle and one mL of 1% lidocaine was injected into the skin and subcutaneous tissue.  Then a 17-gauge spinal needle was inserted under fluoroscopic guidance into the RIght L4-5  interlaminar space under AP and Lateral imaging as well as contralateral Oblique imaging.  Once needle tip of approximated the posterior elements, a loss of resistance technique was utilized with lateral imaging.  A positive loss of resistance was obtained and then confirmed by injecting 2 mL's of Omnipaque 180.  Then a solution containing 1.5 mL's of 6mg /ml Celestone and 1.5 mL's of 1% lidocaine was injected.  The patient tolerated procedure well.  Post procedure instructions were given.  Please see post procedure form.

## 2021-01-23 NOTE — Patient Instructions (Signed)

## 2021-03-14 ENCOUNTER — Other Ambulatory Visit: Payer: Self-pay

## 2021-03-14 ENCOUNTER — Encounter: Payer: Self-pay | Admitting: Physical Medicine & Rehabilitation

## 2021-03-14 ENCOUNTER — Encounter: Payer: Medicaid Other | Attending: Physical Medicine & Rehabilitation | Admitting: Physical Medicine & Rehabilitation

## 2021-03-14 VITALS — BP 118/88 | HR 94 | Ht 69.0 in | Wt 249.0 lb

## 2021-03-14 DIAGNOSIS — M5416 Radiculopathy, lumbar region: Secondary | ICD-10-CM | POA: Diagnosis present

## 2021-03-14 DIAGNOSIS — M48061 Spinal stenosis, lumbar region without neurogenic claudication: Secondary | ICD-10-CM | POA: Insufficient documentation

## 2021-03-14 MED ORDER — DIAZEPAM 10 MG PO TABS
10.0000 mg | ORAL_TABLET | Freq: Once | ORAL | 0 refills | Status: AC
Start: 2021-03-14 — End: 2021-03-14

## 2021-03-14 NOTE — Patient Instructions (Addendum)
Need driver for next injection Take valium as soon as you arrive for next appt epidural

## 2021-03-14 NOTE — Progress Notes (Addendum)
Subjective:    Patient ID: Margaret Owens, female    DOB: 03-Jul-1970, 50 y.o.   MRN: [id number]  HPI RIght L4-5 Translaminar ESI helping with RLE sciatic pain x ~68mo, took 1 wk to become effective  Muscle spasms back of thigh bilaterally placed on flexeril  Sees pain MD at [facility] and is prescribed oxycodone   Patient is not taking anticoagulant medications. Pain Inventory Average Pain 10 Pain Right Now 8 My pain is sharp, stabbing, and aching  In the last 24 hours, has pain interfered with the following? General activity 8 Relation with others 10 Enjoyment of life 10 What TIME of day is your pain at its worst? morning  Sleep (in general) Poor  Pain is worse with: walking, bending, sitting, standing, and some activites Pain improves with: heat/ice, therapy/exercise, medication, and ESI Relief from Meds: 8  Family History  Problem Relation Age of Onset   Osteoarthritis Mother    Breast cancer Maternal Grandmother        spread to lungs    Social History   Socioeconomic History   Marital status: Single    Spouse name: Not on file   Number of children: Not on file   Years of education: Not on file   Highest education level: Not on file  Occupational History   Not on file  Tobacco Use   Smoking status: Never   Smokeless tobacco: Never  Vaping Use   Vaping Use: Never used  Substance and Sexual Activity   Alcohol use: Yes    Comment: 1/week   Drug use: No   Sexual activity: Yes    Partners: Male    Birth control/protection: None  Other Topics Concern   Not on file  Social History Narrative   Not on file   Social Determinants of Health   Financial Resource Strain: Not on file  Food Insecurity: Not on file  Transportation Needs: Not on file  Physical Activity: Not on file  Stress: Not on file  Social Connections: Not on file   Past Surgical History:  Procedure Laterality Date   colonoscopy     Past Surgical History:  Procedure Laterality Date    colonoscopy     Past Medical History:  Diagnosis Date   Anxiety    Arthritis    Asthma    Bursitis    Depression    GERD (gastroesophageal reflux disease)    Hypertension    Muscle spasms of both lower extremities    PTSD (post-traumatic stress disorder)    Sciatica    Seizures ([facility])    Uterine fibroid    BP 118/88   Pulse 94   Ht 5\' 9"  (1.753 m)   Wt 249 lb (112.9 kg)   SpO2 98%   BMI 36.77 kg/m   Opioid Risk Score:   Fall Risk Score:  `1  Depression screen PHQ 2/9  Depression screen [facility] 2/9 03/14/2021 01/23/2021 10/04/2020 08/29/2020 07/02/2020 05/16/2020 04/26/2020  Decreased Interest 2 0 1 1 0 2 0  Down, Depressed, Hopeless 2 0 1 1 0 3 0  PHQ - 2 Score 4 0 2 2 0 5 0  Altered sleeping - - - - - 3 -  Tired, decreased energy - - - - - 2 -  Change in appetite - - - - - 3 -  Feeling bad or failure about yourself  - - - - - 3 -  Trouble concentrating - - - - - 3 -  Moving slowly or fidgety/restless - - - - - 0 -  Suicidal thoughts - - - - - 0 -  PHQ-9 Score - - - - - 19 -  Difficult doing work/chores - - - - - - -      Review of Systems  Musculoskeletal:  Positive for back pain.       Knee, wrist, ankle pain  All other systems reviewed and are negative.     Objective:   Physical Exam Vitals and nursing note reviewed.  Constitutional:      Appearance: She is obese.  HENT:     Head: Normocephalic and atraumatic.  Eyes:     Extraocular Movements: Extraocular movements intact.     Conjunctiva/sclera: Conjunctivae normal.     Pupils: Pupils are equal, round, and reactive to light.  Musculoskeletal:        General: Normal range of motion.  Skin:    General: Skin is warm and dry.  Neurological:     Mental Status: She is alert and oriented to person, place, and time.  Psychiatric:        Mood and Affect: Mood normal.        Behavior: Behavior normal.   Mild pain to palpation lumbar paraspinals from L4-S1 Negative straight leg raising bilaterally Normal  sensation bilateral lower extremity Normal strength in the hip flexors knee extensors ankle dorsiflexors bilaterally Ambulation no evidence of toe drag or knee instability.       Assessment & Plan:   1.  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  I do think it is worthwhile to repeat this next month.  Discussed need for driver. She goes to [facility] pain center where she is prescribed oxycodone PT ordered for conservative care

## 2021-05-09 ENCOUNTER — Ambulatory Visit: Payer: Medicaid Other | Admitting: Physical Medicine & Rehabilitation

## 2021-05-22 NOTE — Addendum Note (Signed)
Addended by: Charlett Blake on: 05/22/2021 02:18 PM   Modules accepted: Orders

## 2021-05-30 ENCOUNTER — Encounter: Payer: Self-pay | Admitting: Physical Medicine & Rehabilitation

## 2021-05-30 ENCOUNTER — Other Ambulatory Visit: Payer: Self-pay

## 2021-05-30 ENCOUNTER — Encounter: Payer: Medicaid Other | Attending: Physical Medicine & Rehabilitation | Admitting: Physical Medicine & Rehabilitation

## 2021-05-30 VITALS — BP 122/81 | HR 112 | Temp 99.0°F | Ht 69.0 in | Wt 253.0 lb

## 2021-05-30 DIAGNOSIS — M48062 Spinal stenosis, lumbar region with neurogenic claudication: Secondary | ICD-10-CM | POA: Diagnosis not present

## 2021-05-30 DIAGNOSIS — M533 Sacrococcygeal disorders, not elsewhere classified: Secondary | ICD-10-CM | POA: Diagnosis not present

## 2021-05-30 DIAGNOSIS — M1712 Unilateral primary osteoarthritis, left knee: Secondary | ICD-10-CM | POA: Insufficient documentation

## 2021-05-30 NOTE — Progress Notes (Signed)
Subjective:    Patient ID: Margaret Owens, female    DOB: January 28, 1971, 51 y.o.   MRN: [id number]  HPI Right L4-5 Translaminar pre injection 10/10 pain , post injection 9/10 but 1 week later pt had some additional relief less than 50% but cannot say exact amount  Using therapy ball for wall sits and other exercises Goes to [facility] medical and is prescribed oxycodone 10mg  QID , cyclobenzaprine 10mg  2-3 x per day , on duloxetine 30mg  BID Patient also has history of right sacroiliac pain manifesting as low back and buttock pain on the right side.  She has had sacroiliac radiofrequency neurotomy on 07/02/2020 after successful but short term relief after sacroiliac nerve blocks.  Her pain in the right low back and buttock area has been reduced at least 50% since that procedure. Pain Inventory Average Pain 10 Pain Right Now 7 My pain is intermittent, tingling, and aching  In the last 24 hours, has pain interfered with the following? General activity 4 Relation with others 4 Enjoyment of life 4 What TIME of day is your pain at its worst? morning  Sleep (in general) Fair  Pain is worse with: walking, bending, sitting, inactivity, standing, and some activites Pain improves with: rest, heat/ice, therapy/exercise, pacing activities, medication, TENS, and injections Relief from Meds: 7  Family History  Problem Relation Age of Onset   Osteoarthritis Mother    Breast cancer Maternal Grandmother        spread to lungs    Social History   Socioeconomic History   Marital status: Single    Spouse name: Not on file   Number of children: Not on file   Years of education: Not on file   Highest education level: Not on file  Occupational History   Not on file  Tobacco Use   Smoking status: Never   Smokeless tobacco: Never  Vaping Use   Vaping Use: Never used  Substance and Sexual Activity   Alcohol use: Yes    Comment: 1/week   Drug use: No   Sexual activity: Yes    Partners: Male     Birth control/protection: None  Other Topics Concern   Not on file  Social History Narrative   Not on file   Social Determinants of Health   Financial Resource Strain: Not on file  Food Insecurity: Not on file  Transportation Needs: Not on file  Physical Activity: Not on file  Stress: Not on file  Social Connections: Not on file   Past Surgical History:  Procedure Laterality Date   colonoscopy     Past Surgical History:  Procedure Laterality Date   colonoscopy     Past Medical History:  Diagnosis Date   Anxiety    Arthritis    Asthma    Bursitis    Depression    GERD (gastroesophageal reflux disease)    Hypertension    Muscle spasms of both lower extremities    PTSD (post-traumatic stress disorder)    Sciatica    Seizures ([facility])    Uterine fibroid    BP 122/81    Ht 5\' 9"  (1.753 m)    Wt 253 lb (114.8 kg)    SpO2 98%    BMI 37.36 kg/m   Opioid Risk Score:   Fall Risk Score:  `1  Depression screen PHQ 2/9  Depression screen [facility] 2/9 05/30/2021 03/14/2021 01/23/2021 10/04/2020 08/29/2020 07/02/2020 05/16/2020  Decreased Interest 1 2 0 1 1 0 2  Down, Depressed,  Hopeless - 2 0 1 1 0 3  PHQ - 2 Score 1 4 0 2 2 0 5  Altered sleeping - - - - - - 3  Tired, decreased energy - - - - - - 2  Change in appetite - - - - - - 3  Feeling bad or failure about yourself  - - - - - - 3  Trouble concentrating - - - - - - 3  Moving slowly or fidgety/restless - - - - - - 0  Suicidal thoughts - - - - - - 0  PHQ-9 Score - - - - - - 19  Difficult doing work/chores - - - - - - -    Review of Systems  Musculoskeletal:  Positive for arthralgias, back pain and gait problem.  All other systems reviewed and are negative.     Objective:   Physical Exam Vitals and nursing note reviewed.  HENT:     Head: Normocephalic and atraumatic.  Eyes:     Extraocular Movements: Extraocular movements intact.     Conjunctiva/sclera: Conjunctivae normal.     Pupils: Pupils are equal, round, and  reactive to light.  Skin:    General: Skin is warm and dry.  Neurological:     Mental Status: She is alert and oriented to person, place, and time.     Comments: Motor strength is 5/5 bilateral hip flexor knee extensor ankle dorsiflexor Negative straight leg raise bilaterally Sensation equal and normal bilaterally in the lower extremities  Psychiatric:        Mood and Affect: Mood normal.        Behavior: Behavior normal.   Left knee no evidence of effusion no pain with joint line palpation no pain around the patella  Moderate PSIS tenderness) left side     Assessment & Plan:   1.  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 11 months plus following radiofrequency of the nerves to the right sacroiliac joint. 2.  History of low back pain with sciatica, only 10 to 20% relief following right L4-5  translaminar injection.  She does not have significant radicular pain at this time based on her pain diagram and overall complaints.  Home exercise program prescribed 3.  Chronic left knee pain due to osteoarthritis, does not appear to be particularly painful at this time, may consider repeat corticosteroid injection if symptoms increase Physical medicine rehab follow-up as needed basis when right low back and buttock pain started to increase for left knee pain starts to increase once again. The patient goes to [facility] pain clinic for her oxycodone

## 2021-05-30 NOTE — Patient Instructions (Addendum)
Back Exercises These exercises help to make your trunk and back strong. They also help to keep the lower back flexible. Doing these exercises can help to prevent or lessen pain in your lower back. If you have back pain, try to do these exercises 2-3 times each day or as told by your doctor. As you get better, do the exercises once each day. Repeat the exercises more often as told by your doctor. To stop back pain from coming back, do the exercises once each day, or as told by your doctor. Do exercises exactly as told by your doctor. Stop right away if you feel sudden pain or your pain gets worse. Exercises Single knee to chest Do these steps 3-5 times in a row for each leg: Lie on your back on a firm bed or the floor with your legs stretched out. Bring one knee to your chest. Grab your knee or thigh with both hands and hold it in place. Pull on your knee until you feel a gentle stretch in your lower back or butt. Keep doing the stretch for 10-30 seconds. Slowly let go of your leg and straighten it. Pelvic tilt Do these steps 5-10 times in a row: Lie on your back on a firm bed or the floor with your legs stretched out. Bend your knees so they point up to the ceiling. Your feet should be flat on the floor. Tighten your lower belly (abdomen) muscles to press your lower back against the floor. This will make your tailbone point up to the ceiling instead of pointing down to your feet or the floor. Stay in this position for 5-10 seconds while you gently tighten your muscles and breathe evenly. Cat-cow Do these steps until your lower back bends more easily: Get on your hands and knees on a firm bed or the floor. Keep your hands under your shoulders, and keep your knees under your hips. You may put padding under your knees. Let your head hang down toward your chest. Tighten (contract) the muscles in your belly. Point your tailbone toward the floor so your lower back becomes rounded like the back of a  cat. Stay in this position for 5 seconds. Slowly lift your head. Let the muscles of your belly relax. Point your tailbone up toward the ceiling so your back forms a sagging arch like the back of a cow. Stay in this position for 5 seconds.  Press-ups Do these steps 5-10 times in a row: Lie on your belly (face-down) on a firm bed or the floor. Place your hands near your head, about shoulder-width apart. While you keep your back relaxed and keep your hips on the floor, slowly straighten your arms to raise the top half of your body and lift your shoulders. Do not use your back muscles. You may change where you place your hands to make yourself more comfortable. Stay in this position for 5 seconds. Keep your back relaxed. Slowly return to lying flat on the floor.  Bridges Do these steps 10 times in a row: Lie on your back on a firm bed or the floor. Bend your knees so they point up to the ceiling. Your feet should be flat on the floor. Your arms should be flat at your sides, next to your body. Tighten your butt muscles and lift your butt off the floor until your waist is almost as high as your knees. If you do not feel the muscles working in your butt and the back of  your thighs, slide your feet 1-2 inches (2.5-5 cm) farther away from your butt. Stay in this position for 3-5 seconds. Slowly lower your butt to the floor, and let your butt muscles relax. If this exercise is too easy, try doing it with your arms crossed over your chest. Belly crunches Do these steps 5-10 times in a row: Lie on your back on a firm bed or the floor with your legs stretched out. Bend your knees so they point up to the ceiling. Your feet should be flat on the floor. Cross your arms over your chest. Tip your chin a little bit toward your chest, but do not bend your neck. Tighten your belly muscles and slowly raise your chest just enough to lift your shoulder blades a tiny bit off the floor. Avoid raising your body  higher than that because it can put too much stress on your lower back. Slowly lower your chest and your head to the floor. Back lifts Do these steps 5-10 times in a row: Lie on your belly (face-down) with your arms at your sides, and rest your forehead on the floor. Tighten the muscles in your legs and your butt. Slowly lift your chest off the floor while you keep your hips on the floor. Keep the back of your head in line with the curve in your back. Look at the floor while you do this. Stay in this position for 3-5 seconds. Slowly lower your chest and your face to the floor. Contact a doctor if: Your back pain gets a lot worse when you do an exercise. Your back pain does not get better within 2 hours after you exercise. If you have any of these problems, stop doing the exercises. Do not do them again unless your doctor says it is okay. Get help right away if: You have sudden, very bad back pain. If this happens, stop doing the exercises. Do not do them again unless your doctor says it is okay. This information is not intended to replace advice given to you by your health care provider. Make sure you discuss any questions you have with your health care provider. Document Revised: 06/12/2020 Document Reviewed: 06/12/2020 Elsevier Patient Education  [employer].   Please call to schedule appt if Left knee or Right low back/buttocks starts hurting more

## 2021-06-04 ENCOUNTER — Ambulatory Visit: Payer: Medicaid Other | Admitting: Physical Therapy

## 2021-06-12 ENCOUNTER — Ambulatory Visit: Payer: Medicaid Other | Attending: Physical Medicine & Rehabilitation

## 2021-06-12 ENCOUNTER — Other Ambulatory Visit: Payer: Self-pay

## 2021-06-12 DIAGNOSIS — M545 Low back pain, unspecified: Secondary | ICD-10-CM

## 2021-06-12 DIAGNOSIS — G8929 Other chronic pain: Secondary | ICD-10-CM | POA: Diagnosis present

## 2021-06-12 DIAGNOSIS — M6281 Muscle weakness (generalized): Secondary | ICD-10-CM | POA: Diagnosis present

## 2021-06-12 DIAGNOSIS — M5417 Radiculopathy, lumbosacral region: Secondary | ICD-10-CM | POA: Diagnosis present

## 2021-06-12 NOTE — Therapy (Signed)
?OUTPATIENT PHYSICAL THERAPY THORACOLUMBAR EVALUATION ? ? ?Patient Name: Margaret Owens ?MRN: [id number] ?DOB:1971-03-05, 51 y.o., female ?Today's Date: 06/12/2021 ? ? PT End of Session - 06/12/21 1249   ? ? Visit Number 1   ? Number of Visits 4   ? Date for PT Re-Evaluation 07/10/21   ? Authorization Type [employer] [facility]   ? Authorization Time Period 06/12/21-07/20/21   ? Progress Note Due on Visit 4   ? PT Start Time 1215   ? PT Stop Time 1300   ? PT Time Calculation (min) 45 min   ? Activity Tolerance Patient tolerated treatment well   ? Behavior During Therapy [facility] for tasks assessed/performed   ? ?  ?  ? ?  ? ? ?Past Medical History:  ?Diagnosis Date  ? Anxiety   ? Arthritis   ? Asthma   ? Bursitis   ? Depression   ? GERD (gastroesophageal reflux disease)   ? Hypertension   ? Muscle spasms of both lower extremities   ? PTSD (post-traumatic stress disorder)   ? Sciatica   ? Seizures ([place])   ? Uterine fibroid   ? ?Past Surgical History:  ?Procedure Laterality Date  ? colonoscopy    ? ?Patient Active Problem List  ? Diagnosis Date Noted  ? Spinal stenosis of lumbar region with radiculopathy 01/23/2021  ? COVID-19 vaccination declined 05/16/2020  ? Prediabetes 05/16/2020  ? Obesity (BMI 35.0-39.9 without comorbidity) 05/16/2020  ? Primary osteoarthritis of left knee 07/04/2018  ? Uterine fibroid 06/09/2017  ? Endometrial stromal nodule 06/09/2017  ? ? ?PCP: Ladell Pier, MD ? ?REFERRING PROVIDER: Charlett Blake, MD ? ?REFERRING DIAG: I45.809,X83.38 (ICD-10-CM) - Spinal stenosis of lumbar region with radiculopathy  ? ?THERAPY DIAG: Spinal stenosis of lumbar region with radiculopathy  ? ? ?ONSET DATE: 7-8 years ? ?SUBJECTIVE:                                                                                                                                                                                          ? ?SUBJECTIVE STATEMENT: ?History of chronic low back and radicular symptoms, worse with prolonged standing  walking, better with rest and sitting ? ?PERTINENT HISTORY:  ?Sciatic pain from L4-5 stenosis, the patient has had a good intermediate term relief with translaminar epidural steroid injection L4-5 on the right side.  I do think it is worthwhile to repeat this next month.  Discussed need for driver. ?She goes to [facility] pain center where she is prescribed oxycodone ?PT ordered for conservative care ? ?PAIN:  ?Are you having pain? Yes ?NPRS scale: 10/10 ?Pain location: lumbosacral spine ?Pain  orientation: Lower  ?PAIN TYPE: aching and burning ?Pain description: intermittent  ?Aggravating factors: standing and walking  ?Relieving factors: rest and sitting ? ?PRECAUTIONS: None ? ?WEIGHT BEARING RESTRICTIONS No ? ?FALLS:  ?Has patient fallen in last 6 months? No, Number of falls: 0 ? ?LIVING ENVIRONMENT: ?Lives with: lives with their family ?Lives in: House/apartment ? ?OCCUPATION: disability ? ?PLOF: Independent ? ?PATIENT GOALS To reduce and manage my low back pain ? ? ?OBJECTIVE:  ? ?DIAGNOSTIC FINDINGS:  ?IMPRESSION: ?1. Degenerative changes of the lumbar spine with progressed ?anterolisthesis at L4-5 where there is mild-to-moderate spinal canal ?stenosis with narrowing of the bilateral subarticular zones, severe ?right and moderate left neural foraminal narrowing. ?2. Moderate right and severe left neural foraminal narrowing at ?L5-S1, unchanged. ?  ?  ?Electronically Signed ?  By: Pedro Earls M.D. ?  On: 09/08/2020 13:57 ? ?PATIENT SURVEYS:  ?ODI ? ?SCREENING FOR RED FLAGS: ?Bowel or bladder incontinence: No ? ?COGNITION: ? Overall cognitive status: Within functional limits for tasks assessed   ?  ?SENSATION: ? Light touch: Appears intact ? ?MUSCLE LENGTH: ?Hamstrings: Right 60 deg; Left 45 deg ?Thomas test: Right low back pain; Left low back pain ? ?POSTURE:  ?Increased lordosis ? ?PALPATION: ?unremarkable ? ?LUMBARAROM/PROM ? ?A/PROM A/PROM  ?06/12/2021  ?Flexion 50%  ?Extension 10%  ?Right  lateral flexion 75%  ?Left lateral flexion 50%  ?Right rotation   ?Left rotation   ? (Blank rows = not tested) ? ?LE AROM/PROM: ? ?A/PROM Right ?06/12/2021 Left ?06/12/2021  ?Hip flexion 120 90  ?Hip extension    ?Hip abduction    ?Hip adduction    ?Hip internal rotation    ?Hip external rotation    ?Knee flexion 120 120  ?Knee extension 0 0  ?Ankle dorsiflexion    ?Ankle plantarflexion    ?Ankle inversion    ?Ankle eversion    ? (Blank rows = not tested) ? ?LE MMT: ? ?MMT Right ?06/12/2021 Left ?06/12/2021  ?Hip flexion 4 4-  ?Hip extension 4 4  ?Hip abduction    ?Hip adduction    ?Hip internal rotation    ?Hip external rotation    ?Knee flexion 4 4  ?Knee extension 4- 4-  ?Ankle dorsiflexion    ?Ankle plantarflexion    ?Ankle inversion    ?Ankle eversion    ? (Blank rows = not tested) ? ?LUMBAR SPECIAL TESTS:  ?Straight leg raise test: Negative, Slump test: Negative, FABER test: Negative, and Thomas test: Positive for low back pain B ? ?FUNCTIONAL TESTS:  ?5 times sit to stand: 32s ? ?GAIT: ?Distance walked: 77ft x2 ?Assistive device utilized: Single point cane ?Level of assistance: Complete Independence ?Comments: mildly antalgic ? ? ? ?TODAY'S TREATMENT  ?06/12/21 Eval and HEP ? ? ?PATIENT EDUCATION:  ?Education details: Discussed eval findings, rehab rationale and POC and patient is in agreement  ?Person educated: Patient ?Education method: Explanation ?Education comprehension: verbalized understanding ? ? ?HOME EXERCISE PROGRAM: ?Access Code: [email or web address] ?URL: [email or web address] ?Date: 06/12/2021 ?Prepared by: Sharlynn Oliphant ? ?Exercises ?Modified Thomas Stretch - 2 x daily - 7 x weekly - 1 sets - 2 reps - 30s hold ?Curl Up with Arms Crossed - 2 x daily - 7 x weekly - 2 sets - 15 reps ?Supine March - 2 x daily - 7 x weekly - 1 sets - 15 reps ?Supine Bridge - 2 x daily - 7 x weekly - 1 sets - 15 reps ? ? ?ASSESSMENT: ? ?CLINICAL IMPRESSION: ?  Patient is a  51 y.o. female who was seen today for physical  therapy evaluation and treatment for chronic low back pain. She presents with decreased LE strength R>L, limited lumbar ROM, no radicular symptoms and core weakness ? ? ?OBJECTIVE IMPAIRMENTS Abnormal gait, decreased activity tolerance, decreased endurance, decreased knowledge of condition, decreased ROM, decreased strength, obesity, and pain.  ? ?ACTIVITY LIMITATIONS cleaning, community activity, laundry, and walking/standing .  ? ?PERSONAL FACTORS Behavior pattern, Fitness, Past/current experiences, and Time since onset of injury/illness/exacerbation are also affecting patient's functional outcome.  ? ? ?REHAB POTENTIAL: Fair based on chronic nature ? ?CLINICAL DECISION MAKING: Stable/uncomplicated ? ?EVALUATION COMPLEXITY: Low ? ? ?GOALS: ?Goals reviewed with patient? Yes ? ?SHORT TERM GOALS: STGs= LTGs ? ? ?LONG TERM GOALS: ? ?Patient to demonstrate independence in HEP  ?Baseline: [id number] ?Target date: 07/10/2021 ?Goal status: INITIAL ? ?2.  120d L hip flexion ?Baseline: 90d ?Target date: 07/10/2021 ?Goal status: INITIAL ? ?3.  Improve ODI score to 50% ?Baseline: 70% ?Target date: 07/10/2021 ?Goal status: INITIAL ? ?4.  Decrease 5x STS time to 25s w/o UE support ?Baseline: 32s w/o UE support ?Target date: 07/10/2021 ?Goal status: INITIAL ? ?PLAN: ?PT FREQUENCY: 1x/week ? ?PT DURATION: 3 weeks ? ?PLANNED INTERVENTIONS: Therapeutic exercises, Therapeutic activity, Neuromuscular re-education, Balance training, Gait training, Patient/Family education, Joint mobilization, and Stair training ? ?PLAN FOR NEXT SESSION: HEP review and update, hip and core strengthening, aerobic tasks, posture and BM training ? ? ?Lanice Shirts, PT ?06/12/2021, 12:50 PM  ? ?[employer] Authorization  ? ?Choose one: Rehabilitative ? ?Standardized Assessment or Functional Outcome Tool: Oswestry ? ?Score or Percent Disability: 70% ? ?Body Parts Treated (Select each separately):  ?Lumbopelvic. Overall deficits/functional limitations for body part  selected: moderate ? ? ?If treatment provided at initial evaluation, no treatment charged due to lack of authorization.   ?

## 2021-06-20 ENCOUNTER — Other Ambulatory Visit: Payer: Self-pay

## 2021-06-20 ENCOUNTER — Ambulatory Visit: Payer: Medicaid Other

## 2021-06-20 DIAGNOSIS — G8929 Other chronic pain: Secondary | ICD-10-CM

## 2021-06-20 DIAGNOSIS — M6281 Muscle weakness (generalized): Secondary | ICD-10-CM

## 2021-06-20 DIAGNOSIS — M545 Low back pain, unspecified: Secondary | ICD-10-CM | POA: Diagnosis not present

## 2021-06-20 NOTE — Therapy (Signed)
?OUTPATIENT PHYSICAL THERAPY TREATMENT NOTE ? ? ?Patient Name: Margaret Owens ?MRN: [id number] ?DOB:Sep 08, 1970, 51 y.o., female ?Today's Date: 06/20/2021 ? ?PCP: Ladell Pier, MD ?REFERRING PROVIDER: Ladell Pier, MD ? ? PT End of Session - 06/20/21 1051   ? ? Visit Number 2   ? Number of Visits 4   ? Date for PT Re-Evaluation 07/10/21   ? Authorization Type [employer] [facility]   ? Authorization Time Period 06/12/21-07/20/21   ? Progress Note Due on Visit 4   ? PT Start Time 1050   ? PT Stop Time 1130   ? PT Time Calculation (min) 40 min   ? ?  ?  ? ?  ? ? ?Past Medical History:  ?Diagnosis Date  ? Anxiety   ? Arthritis   ? Asthma   ? Bursitis   ? Depression   ? GERD (gastroesophageal reflux disease)   ? Hypertension   ? Muscle spasms of both lower extremities   ? PTSD (post-traumatic stress disorder)   ? Sciatica   ? Seizures ([place])   ? Uterine fibroid   ? ?Past Surgical History:  ?Procedure Laterality Date  ? colonoscopy    ? ?Patient Active Problem List  ? Diagnosis Date Noted  ? Spinal stenosis of lumbar region with radiculopathy 01/23/2021  ? COVID-19 vaccination declined 05/16/2020  ? Prediabetes 05/16/2020  ? Obesity (BMI 35.0-39.9 without comorbidity) 05/16/2020  ? Primary osteoarthritis of left knee 07/04/2018  ? Uterine fibroid 06/09/2017  ? Endometrial stromal nodule 06/09/2017  ? ? ?REFERRING DIAG: M48.061,M54.16 (ICD-10-CM) - Spinal stenosis of lumbar region with radiculopathy  ? ?THERAPY DIAG: Spinal stenosis of lumbar region with radiculopathy  ? ? ?PERTINENT HISTORY: Sciatic pain from L4-5 stenosis, the patient has had a good intermediate term relief with translaminar epidural steroid injection L4-5 on the right side.  I do think it is worthwhile to repeat this next month.  Discussed need for driver. ?She goes to [facility] pain center where she is prescribed oxycodone ?PT ordered for conservative care ? ?PRECAUTIONS: none noted ? ?SUBJECTIVE: No changes to note, has been working on HEP and does not  report any increase in symptoms ? ?PAIN:  ?Are you having pain? Yes ?NPRS scale: 8/10 ?Pain location: Low back ?Pain orientation: Left  ? ? ? ? ?OBJECTIVE:  ?  ?DIAGNOSTIC FINDINGS:  ?IMPRESSION: ?1. Degenerative changes of the lumbar spine with progressed ?anterolisthesis at L4-5 where there is mild-to-moderate spinal canal ?stenosis with narrowing of the bilateral subarticular zones, severe ?right and moderate left neural foraminal narrowing. ?2. Moderate right and severe left neural foraminal narrowing at ?L5-S1, unchanged. ?  ?  ?Electronically Signed ?  By: Pedro Earls M.D. ?  On: 09/08/2020 13:57 ?  ?PATIENT SURVEYS:  ?ODI ?  ?SCREENING FOR RED FLAGS: ?Bowel or bladder incontinence: No ?  ?COGNITION: ?         Overall cognitive status: Within functional limits for tasks assessed              ?          ?SENSATION: ?         Light touch: Appears intact ?  ?MUSCLE LENGTH: ?Hamstrings: Right 60 deg; Left 45 deg ?Thomas test: Right low back pain; Left low back pain ?  ?POSTURE:  ?Increased lordosis ?  ?PALPATION: ?unremarkable ?  ?LUMBARAROM/PROM ?  ?A/PROM A/PROM  ?06/12/2021  ?Flexion 50%  ?Extension 10%  ?Right lateral flexion 75%  ?Left lateral flexion  50%  ?Right rotation    ?Left rotation    ? (Blank rows = not tested) ?  ?LE AROM/PROM: ?  ?A/PROM Right ?06/12/2021 Left ?06/12/2021  ?Hip flexion 120 90  ?Hip extension      ?Hip abduction      ?Hip adduction      ?Hip internal rotation      ?Hip external rotation      ?Knee flexion 120 120  ?Knee extension 0 0  ?Ankle dorsiflexion      ?Ankle plantarflexion      ?Ankle inversion      ?Ankle eversion      ? (Blank rows = not tested) ?  ?LE MMT: ?  ?MMT Right ?06/12/2021 Left ?06/12/2021  ?Hip flexion 4 4-  ?Hip extension 4 4  ?Hip abduction      ?Hip adduction      ?Hip internal rotation      ?Hip external rotation      ?Knee flexion 4 4  ?Knee extension 4- 4-  ?Ankle dorsiflexion      ?Ankle plantarflexion      ?Ankle inversion      ?Ankle eversion       ? (Blank rows = not tested) ?  ?LUMBAR SPECIAL TESTS:  ?Straight leg raise test: Negative, Slump test: Negative, FABER test: Negative, and Thomas test: Positive for low back pain B ?  ?FUNCTIONAL TESTS:  ?5 times sit to stand: 32s ?  ?GAIT: ?Distance walked: 13f x2 ?Assistive device utilized: Single point cane ?Level of assistance: Complete Independence ?Comments: mildly antalgic ?  ?  ?  ?[facility]  [facility]                                                [facility] ?Therapeutic Exercise: ?Nustep L2 8 min arms 6 ?Curl ups 15x ?Supine march 15/15 ?SKTC B 30s x3 ?SLR 15/15 ?Hip flexor stretch 30s x3 B ?LTR 30s x3 B ?Clamshells 15/15 ? ? ?  ?  ?PATIENT EDUCATION:  ?Education details: Discussed eval findings, rehab rationale and POC and patient is in agreement  ?Person educated: Patient ?Education method: Explanation ?Education comprehension: verbalized understanding ?  ?  ?HOME EXERCISE PROGRAM: ?Access Code: [email or web address]?URL: [email or web address] ?Date: 06/12/2021 ?Prepared by: JSharlynn Oliphant?  ?Exercises ?Modified Thomas Stretch - 2 x daily - 7 x weekly - 1 sets - 2 reps - 30s hold ?Curl Up with Arms Crossed - 2 x daily - 7 x weekly - 2 sets - 15 reps ?Supine March - 2 x daily - 7 x weekly - 1 sets - 15 reps ?Supine Bridge - 2 x daily - 7 x weekly - 1 sets - 15 reps ?  ?  ?ASSESSMENT: ?  ?CLINICAL IMPRESSION: today's session reviewed HEP correcting form as needed.  Added additional stretches to facilitate mobility and ROM in low back and hips.  Incorporated new exercises to address strength deficits in hips and low back.  SLR and hip stretching contributing to low back symptoms  ? ?  ?  ?OBJECTIVE IMPAIRMENTS Abnormal gait, decreased activity tolerance, decreased endurance, decreased knowledge of condition, decreased ROM, decreased strength, obesity, and pain.  ?  ?ACTIVITY LIMITATIONS cleaning, community activity, laundry, and walking/standing .  ?  ?PERSONAL FACTORS  Behavior pattern, Fitness, Past/current experiences, and Time since onset of injury/illness/exacerbation  are also affecting patient's functional outcome.  ?  ?  ?REHAB POTENTIAL: Fair based on chronic nature ?  ?CLINICAL DECISION MAKING: Stable/uncomplicated ?  ?EVALUATION COMPLEXITY: Low ?  ?  ?GOALS: ?Goals reviewed with patient? Yes ?  ?SHORT TERM GOALS: STGs= LTGs ?  ?  ?LONG TERM GOALS: ?  ?Patient to demonstrate independence in HEP  ?Baseline: [id number] ?Target date: 07/10/2021 ?Goal status: INITIAL ?  ?2.  120d L hip flexion ?Baseline: 90d ?Target date: 07/10/2021 ?Goal status: INITIAL ?  ?3.  Improve ODI score to 50% ?Baseline: 70% ?Target date: 07/10/2021 ?Goal status: INITIAL ?  ?4.  Decrease 5x STS time to 25s w/o UE support ?Baseline: 32s w/o UE support ?Target date: 07/10/2021 ?Goal status: INITIAL ?  ?PLAN: ?PT FREQUENCY: 1x/week ?  ?PT DURATION: 3 weeks ?  ?PLANNED INTERVENTIONS: Therapeutic exercises, Therapeutic activity, Neuromuscular re-education, Balance training, Gait training, Patient/Family education, Joint mobilization, and Stair training ?  ?PLAN FOR NEXT SESSION: HEP review and update, hip and core strengthening, aerobic tasks, posture and BM training, B hip strenghening ? ? ? ?Lanice Shirts, PT ?06/20/2021, 10:52 AM ? ?   ?

## 2021-06-25 ENCOUNTER — Ambulatory Visit: Payer: Medicaid Other

## 2021-06-25 ENCOUNTER — Other Ambulatory Visit: Payer: Self-pay

## 2021-06-25 DIAGNOSIS — M545 Low back pain, unspecified: Secondary | ICD-10-CM

## 2021-06-25 DIAGNOSIS — M5417 Radiculopathy, lumbosacral region: Secondary | ICD-10-CM

## 2021-06-25 DIAGNOSIS — M6281 Muscle weakness (generalized): Secondary | ICD-10-CM

## 2021-06-25 NOTE — Therapy (Signed)
?OUTPATIENT PHYSICAL THERAPY TREATMENT NOTE ? ? ?Patient Name: Margaret Owens ?MRN: [id number] ?DOB:1970-10-26, 51 y.o., female ?Today's Date: 06/25/2021 ? ?PCP: Ladell Pier, MD ?REFERRING PROVIDER: Charlett Blake, MD ? ? PT End of Session - 06/25/21 1047   ? ? Visit Number 3   ? Number of Visits 4   ? Date for PT Re-Evaluation 07/10/21   ? Authorization Type [employer] [facility]   ? Authorization Time Period 06/12/21-07/20/21   ? Progress Note Due on Visit 4   ? PT Start Time 1048   ? PT Stop Time 1130   ? PT Time Calculation (min) 42 min   ? Activity Tolerance Patient tolerated treatment well   ? Behavior During Therapy [facility] for tasks assessed/performed   ? ?  ?  ? ?  ? ? ?Past Medical History:  ?Diagnosis Date  ? Anxiety   ? Arthritis   ? Asthma   ? Bursitis   ? Depression   ? GERD (gastroesophageal reflux disease)   ? Hypertension   ? Muscle spasms of both lower extremities   ? PTSD (post-traumatic stress disorder)   ? Sciatica   ? Seizures ([place])   ? Uterine fibroid   ? ?Past Surgical History:  ?Procedure Laterality Date  ? colonoscopy    ? ?Patient Active Problem List  ? Diagnosis Date Noted  ? Spinal stenosis of lumbar region with radiculopathy 01/23/2021  ? COVID-19 vaccination declined 05/16/2020  ? Prediabetes 05/16/2020  ? Obesity (BMI 35.0-39.9 without comorbidity) 05/16/2020  ? Primary osteoarthritis of left knee 07/04/2018  ? Uterine fibroid 06/09/2017  ? Endometrial stromal nodule 06/09/2017  ? ? ?REFERRING DIAG: M48.061,M54.16 (ICD-10-CM) - Spinal stenosis of lumbar region with radiculopathy  ? ?THERAPY DIAG: Spinal stenosis of lumbar region with radiculopathy  ? ? ?PERTINENT HISTORY: Sciatic pain from L4-5 stenosis, the patient has had a good intermediate term relief with translaminar epidural steroid injection L4-5 on the right side.  I do think it is worthwhile to repeat this next month.  Discussed need for driver. ?She goes to [facility] pain center where she is prescribed oxycodone ?PT ordered for  conservative care ? ?PRECAUTIONS: none noted ? ?SUBJECTIVE: has been performing HEP, no relief yet but has been compliant with stretches and exercises. ? ?PAIN:  ?Are you having pain? Yes ?NPRS scale: 8/10 ?Pain location: Low back ?Pain orientation: Left  ? ? ? ? ?OBJECTIVE:  ?  ?DIAGNOSTIC FINDINGS:  ?IMPRESSION: ?1. Degenerative changes of the lumbar spine with progressed ?anterolisthesis at L4-5 where there is mild-to-moderate spinal canal ?stenosis with narrowing of the bilateral subarticular zones, severe ?right and moderate left neural foraminal narrowing. ?2. Moderate right and severe left neural foraminal narrowing at ?L5-S1, unchanged. ?  ?  ?Electronically Signed ?  By: Pedro Earls M.D. ?  On: 09/08/2020 13:57 ?  ?PATIENT SURVEYS:  ?ODI 70% ?  ?SCREENING FOR RED FLAGS: ?Bowel or bladder incontinence: No ?  ?COGNITION: ?         Overall cognitive status: Within functional limits for tasks assessed              ?          ?SENSATION: ?         Light touch: Appears intact ?  ?MUSCLE LENGTH: ?Hamstrings: Right 60 deg; Left 45 deg ?Thomas test: Right low back pain; Left low back pain ?  ?POSTURE:  ?Increased lordosis ?  ?PALPATION: ?unremarkable ?  ?LUMBARAROM/PROM ?  ?A/PROM  A/PROM  ?06/12/2021  ?Flexion 50%  ?Extension 10%  ?Right lateral flexion 75%  ?Left lateral flexion 50%  ?Right rotation    ?Left rotation    ? (Blank rows = not tested) ?  ?LE AROM/PROM: ?  ?A/PROM Right ?06/12/2021 Left ?06/12/2021  ?Hip flexion 120 90  ?Hip extension      ?Hip abduction      ?Hip adduction      ?Hip internal rotation      ?Hip external rotation      ?Knee flexion 120 120  ?Knee extension 0 0  ?Ankle dorsiflexion      ?Ankle plantarflexion      ?Ankle inversion      ?Ankle eversion      ? (Blank rows = not tested) ?  ?LE MMT: ?  ?MMT Right ?06/12/2021 Left ?06/12/2021  ?Hip flexion 4 4-  ?Hip extension 4 4  ?Hip abduction      ?Hip adduction      ?Hip internal rotation      ?Hip external rotation      ?Knee  flexion 4 4  ?Knee extension 4- 4-  ?Ankle dorsiflexion      ?Ankle plantarflexion      ?Ankle inversion      ?Ankle eversion      ? (Blank rows = not tested) ?  ?LUMBAR SPECIAL TESTS:  ?Straight leg raise test: Negative, Slump test: Negative, FABER test: Negative, and Thomas test: Positive for low back pain B ?  ?FUNCTIONAL TESTS:  ?5 times sit to stand: 32s ?  ?GAIT: ?Distance walked: 58f x2 ?Assistive device utilized: Single point cane ?Level of assistance: Complete Independence ?Comments: mildly antalgic ?  ?  ?  ?[facility] TREATMENT  [facility]                                                [facility] ?Therapeutic Exercise: ?Nustep L2 8 min arms 6 ?Curl ups 20x ?Supine march 20/20 ?SKTC B 30s x3 ?SLR 20/20 ?Hip flexor stretch 30s x3 B ?LTR 30s x3 B ?Clamshells 20/20 ?Open book 10/10 ? ?[facility]                                                [facility]: 06/20/21 ?Therapeutic Exercise: ?Nustep L2 8 min arms 6 ?Curl ups 15x ?Supine march 15/15 ?SKTC B 30s x3 ?SLR 15/15 ?Hip flexor stretch 30s x3 B ?LTR 30s x3 B ?Clamshells 15/15 ? ? ?  ?  ?PATIENT EDUCATION:  ?Education details: Discussed eval findings, rehab rationale and POC and patient is in agreement  ?Person educated: Patient ?Education method: Explanation ?Education comprehension: verbalized understanding ?  ?  ?HOME EXERCISE PROGRAM: ?Access Code: [email or web address]?URL: [email or web address] ?Date: 06/12/2021 ?Prepared by: JSharlynn Oliphant?  ?Exercises ?Modified Thomas Stretch - 2 x daily - 7 x weekly - 1 sets - 2 reps - 30s hold ?Curl Up with Arms Crossed - 2 x daily - 7 x weekly - 2 sets - 15 reps ?Supine March - 2 x daily - 7 x weekly - 1 sets - 15 reps ?Supine Bridge - 2 x daily - 7 x weekly - 1 sets - 15 reps ?  ?  ?  ASSESSMENT: ?  ?CLINICAL IMPRESSION: Continued with stretching and strengthening adding reps as noted. Continued weakness and tightness throughout anterior hips, excellent mobility noted with open book  stretch.  Progressing well through strength and stretching, weakness in hips evident. ? ?  ?  ?OBJECTIVE IMPAIRMENTS Abnormal gait, decreased activity tolerance, decreased endurance, decreased knowledge of condition, decreased ROM, decreased strength, obesity, and pain.  ?  ?ACTIVITY LIMITATIONS cleaning, community activity, laundry, and walking/standing .  ?  ?PERSONAL FACTORS Behavior pattern, Fitness, Past/current experiences, and Time since onset of injury/illness/exacerbation are also affecting patient's functional outcome.  ?  ?  ?REHAB POTENTIAL: Fair based on chronic nature ?  ?CLINICAL DECISION MAKING: Stable/uncomplicated ?  ?EVALUATION COMPLEXITY: Low ?  ?  ?GOALS: ?Goals reviewed with patient? Yes ?  ?SHORT TERM GOALS: STGs= LTGs ?  ?  ?LONG TERM GOALS: ?  ?Patient to demonstrate independence in HEP  ?Baseline: JNKHEP3K ?Target date: 07/10/2021 ?Goal status: INITIAL ?  ?2.  120d L hip flexion ?Baseline: 90d ?Target date: 07/10/2021 ?Goal status: INITIAL ?  ?3.  Improve ODI score to 50% ?Baseline: 70% ?Target date: 07/10/2021 ?Goal status: INITIAL ?  ?4.  Decrease 5x STS time to 25s w/o UE support ?Baseline: 32s w/o UE support ?Target date: 07/10/2021 ?Goal status: INITIAL ?  ?PLAN: ?PT FREQUENCY: 1x/week ?  ?PT DURATION: 3 weeks ?  ?PLANNED INTERVENTIONS: Therapeutic exercises, Therapeutic activity, Neuromuscular re-education, Balance training, Gait training, Patient/Family education, Joint mobilization, and Stair training ?  ?PLAN FOR NEXT SESSION: Assess for DC, review sweeping tasks, consider need to extend PT ? ? ? ?Lanice Shirts, PT ?06/25/2021, 10:47 AM ? ?   ?

## 2021-06-30 ENCOUNTER — Other Ambulatory Visit: Payer: Self-pay

## 2021-06-30 ENCOUNTER — Ambulatory Visit: Payer: Medicaid Other

## 2021-06-30 DIAGNOSIS — M5417 Radiculopathy, lumbosacral region: Secondary | ICD-10-CM

## 2021-06-30 DIAGNOSIS — M6281 Muscle weakness (generalized): Secondary | ICD-10-CM

## 2021-06-30 DIAGNOSIS — M545 Low back pain, unspecified: Secondary | ICD-10-CM | POA: Diagnosis not present

## 2021-06-30 NOTE — Therapy (Addendum)
?OUTPATIENT PHYSICAL THERAPY TREATMENT NOTE ? ? ?Patient Name: Margaret Owens ?MRN: [id number] ?DOB:June 15, 1970, 51 y.o., female ?Today's Date: 06/30/2021 ? ?PCP: Ladell Pier, MD ?REFERRING PROVIDER: Ladell Pier, MD ? ? ? 06/30/21 1053  ?PT Visits / Re-Eval  ?Visit Number 4  ?Number of Visits 8  ?Date for PT Re-Evaluation 07/10/21  ?Authorization  ?Authorization Type [employer] [facility]  ?Authorization Time Period 06/12/21-07/20/21  ?Progress Note Due on Visit 8  ?PT Time Calculation  ?PT Start Time 1050  ?PT Stop Time 1130  ?PT Time Calculation (min) 40 min  ?PT - End of Session  ?Activity Tolerance Patient tolerated treatment well  ?Behavior During Therapy [facility] for tasks assessed/performed  ? ? ? ?Past Medical History:  ?Diagnosis Date  ? Anxiety   ? Arthritis   ? Asthma   ? Bursitis   ? Depression   ? GERD (gastroesophageal reflux disease)   ? Hypertension   ? Muscle spasms of both lower extremities   ? PTSD (post-traumatic stress disorder)   ? Sciatica   ? Seizures ([place])   ? Uterine fibroid   ? ?Past Surgical History:  ?Procedure Laterality Date  ? colonoscopy    ? ?Patient Active Problem List  ? Diagnosis Date Noted  ? Spinal stenosis of lumbar region with radiculopathy 01/23/2021  ? COVID-19 vaccination declined 05/16/2020  ? Prediabetes 05/16/2020  ? Obesity (BMI 35.0-39.9 without comorbidity) 05/16/2020  ? Primary osteoarthritis of left knee 07/04/2018  ? Uterine fibroid 06/09/2017  ? Endometrial stromal nodule 06/09/2017  ? ? ?REFERRING DIAG: M48.061,M54.16 (ICD-10-CM) - Spinal stenosis of lumbar region with radiculopathy  ? ?THERAPY DIAG: Spinal stenosis of lumbar region with radiculopathy  ? ? ?PERTINENT HISTORY: Sciatic pain from L4-5 stenosis, the patient has had a good intermediate term relief with translaminar epidural steroid injection L4-5 on the right side.  I do think it is worthwhile to repeat this next month.  Discussed need for driver. ?She goes to [facility] pain center where she is prescribed  oxycodone ?PT ordered for conservative care ? ?PRECAUTIONS: none noted ? ?SUBJECTIVE: Reports an increased ability to participate in cleaning and ADLs, but sweeping, mopping and low reaching still uncomfortable. ? ?PAIN:  ?Are you having pain? Yes ?NPRS scale: 8/10 ?Pain location: Low back ?Pain orientation: Left  ? ? ? ? ?OBJECTIVE:  ?  ?DIAGNOSTIC FINDINGS:  ?IMPRESSION: ?1. Degenerative changes of the lumbar spine with progressed ?anterolisthesis at L4-5 where there is mild-to-moderate spinal canal ?stenosis with narrowing of the bilateral subarticular zones, severe ?right and moderate left neural foraminal narrowing. ?2. Moderate right and severe left neural foraminal narrowing at ?L5-S1, unchanged. ?  ?  ?Electronically Signed ?  By: Pedro Earls M.D. ?  On: 09/08/2020 13:57 ?  ?PATIENT SURVEYS:  ?ODI 70% ?  ?SCREENING FOR RED FLAGS: ?Bowel or bladder incontinence: No ?  ?COGNITION: ?         Overall cognitive status: Within functional limits for tasks assessed              ?          ?SENSATION: ?         Light touch: Appears intact ?  ?MUSCLE LENGTH: ?Hamstrings: Right 60 deg; Left 45 deg ?Thomas test: Right low back pain; Left low back pain ?  ?POSTURE:  ?Increased lordosis ?  ?PALPATION: ?unremarkable ?  ?LUMBARAROM/PROM ?  ?A/PROM A/PROM  ?06/12/2021  ?Flexion 50%  ?Extension 10%  ?Right lateral flexion 75%  ?  Left lateral flexion 50%  ?Right rotation    ?Left rotation    ? (Blank rows = not tested) ?  ?LE AROM/PROM: ?  ?A/PROM Right ?06/12/2021 Left ?06/12/2021  ?Hip flexion 120 90  ?Hip extension      ?Hip abduction      ?Hip adduction      ?Hip internal rotation      ?Hip external rotation      ?Knee flexion 120 120  ?Knee extension 0 0  ?Ankle dorsiflexion      ?Ankle plantarflexion      ?Ankle inversion      ?Ankle eversion      ? (Blank rows = not tested) ?  ?LE MMT: ?  ?MMT Right ?06/12/2021 Left ?06/12/2021  ?Hip flexion 4 4-  ?Hip extension 4 4  ?Hip abduction      ?Hip adduction      ?Hip  internal rotation      ?Hip external rotation      ?Knee flexion 4 4  ?Knee extension 4- 4-  ?Ankle dorsiflexion      ?Ankle plantarflexion      ?Ankle inversion      ?Ankle eversion      ? (Blank rows = not tested) ?  ?LUMBAR SPECIAL TESTS:  ?Straight leg raise test: Negative, Slump test: Negative, FABER test: Negative, and Thomas test: Positive for low back pain B ?  ?FUNCTIONAL TESTS:  ?5 times sit to stand: 32s ?  ?GAIT: ?Distance walked: 75f x2 ?Assistive device utilized: Single point cane ?Level of assistance: Complete Independence ?Comments: mildly antalgic ?  ?  ?  ?TOD[facility]'S TREATMENT [facility]                                                [facility]: 06/30/[facility] ?Therapeutic Exercise: ?Nustep L1 8 min arms 6(resistance lessened due to soreness) ?Curl ups 20x ?Supine march 20/20 ?SKTC L 30s x3 ?SLR 20/20 ?Hip flexor stretch 30s x3 B ?Clamshells 20/20 ?Open book 10/10 ?5x STS 21s ? ? ?[facility]                                                DATE: 06[facility]25[facility]21 ?Therapeutic Exercise: ?Nustep L2 8 min arms 6 ?Curl ups 20x ?Supine march 20/20 ?SKTC B 30s x3 ?SLR 20/20 ?Hip flexor stretch 30s x3 B ?LTR 30s x3 B ?Clamshells 20/20 ?Open book 10/10 ? ?[facility]                                                DATE: 06/20/21 ?Therapeutic Exercise: ?Nustep L2 8 min arms 6 ?Curl ups 15x ?Supine march 15/15 ?SKTC B 30s x3 ?SLR 15/15 ?Hip flexor stretch 30s x3 B ?LTR 30s x3 B ?Clamshells 15/15 ? ? ?  ?  ?PATIENT EDUCATION:  ?Education details: Discussed eval findings, rehab rationale and POC and patient is in agreement  ?Person educated: Patient ?Education method: Explanation ?Education comprehension: verbalized understanding ?  ?  ?HOME EXERCISE PROGRAM: ?Access Code: [email or web address]?URL: [email or web address] ?Date: 06/12/2021 ?Prepared by: JDellis Filbert  Ophie Burrowes ?  ?Exercises ?Modified Thomas Stretch - 2 x daily - 7 x weekly - 1 sets - 2 reps - 30s hold ?Curl Up with Arms Crossed - 2 x  daily - 7 x weekly - 2 sets - 15 reps ?Supine March - 2 x daily - 7 x weekly - 1 sets - 15 reps ?Supine Bridge - 2 x daily - 7 x weekly - 1 sets - 15 reps ?  ?  ?ASSESSMENT: ?  ?CLINICAL IMPRESSION: Goals assessed with progress made towards L hip ROM, 5x STS time and overall functional rating.  Still showing tightness in hip flexors as well as spinal extensors as evidenced by inability to stand fully upright with 5x STS testing. ?  ?  ?OBJECTIVE IMPAIRMENTS Abnormal gait, decreased activity tolerance, decreased endurance, decreased knowledge of condition, decreased ROM, decreased strength, obesity, and pain.  ?  ?ACTIVITY LIMITATIONS cleaning, community activity, laundry, and walking/standing .  ?  ?PERSONAL FACTORS Behavior pattern, Fitness, Past/current experiences, and Time since onset of injury/illness/exacerbation are also affecting patient's functional outcome.  ?  ?  ?REHAB POTENTIAL: Fair based on chronic nature ?  ?CLINICAL DECISION MAKING: Stable/uncomplicated ?  ?EVALUATION COMPLEXITY: Low ?  ?  ?GOALS: ?Goals reviewed with patient? Yes ?  ?SHORT TERM GOALS: STGs= LTGs ?  ?  ?LONG TERM GOALS: ?  ?Patient to demonstrate independence in HEP  ?Baseline: JNKHEP3K ?Target date: 07/10/2021 ?Goal status: ONGOING ?  ?2.  120d L hip flexion ?Baseline: 90d; 06/30/21 110d PROM ?Target date: 07/10/2021 ?Goal status: Ongoing ?  ?3.  Improve ODI score to 50% ?Baseline: 70% ?Target date: 07/10/2021 ?Goal status: ONGOING ?  ?4.  Decrease 5x STS time to 25s w/o UE support ?Baseline: 32s w/o UE support; 06/30/21 21s w/o UE support ?Target date: 07/10/2021 ?Goal status: MET ?  ?PLAN: ?PT FREQUENCY: 1x/week ?  ?PT DURATION: 4 weeks ?  ?PLANNED INTERVENTIONS: Therapeutic exercises, Therapeutic activity, Neuromuscular re-education, Balance training, Gait training, Patient/Family education, Joint mobilization, and Stair training ?  ?PLAN FOR NEXT SESSION: review sweeping tasks, consider need to extend PT, HEP update, STS with OH  reach ? ? ? ?Lanice Shirts, PT ?06/30/2021, 10:49 AM ? ?   ?

## 2021-07-09 ENCOUNTER — Ambulatory Visit: Payer: Medicaid Other

## 2021-07-09 ENCOUNTER — Other Ambulatory Visit: Payer: Self-pay

## 2021-07-09 DIAGNOSIS — G8929 Other chronic pain: Secondary | ICD-10-CM

## 2021-07-09 DIAGNOSIS — M5417 Radiculopathy, lumbosacral region: Secondary | ICD-10-CM

## 2021-07-09 DIAGNOSIS — M545 Low back pain, unspecified: Secondary | ICD-10-CM | POA: Diagnosis not present

## 2021-07-09 DIAGNOSIS — M6281 Muscle weakness (generalized): Secondary | ICD-10-CM

## 2021-07-09 NOTE — Therapy (Signed)
?OUTPATIENT PHYSICAL THERAPY TREATMENT NOTE ? ? ?Patient Name: Margaret Owens ?MRN: [id number] ?DOB:10/02/1970, 51 y.o., female ?Today's Date: 07/09/2021 ? ?PCP: Ladell Pier, MD ?REFERRING PROVIDER: Ladell Pier, MD ? ? PT End of Session - 07/09/21 1015   ? ? Visit Number 5   ? Number of Visits 8   ? Date for PT Re-Evaluation 07/10/21   ? Authorization Type [employer] [facility]   ? Authorization Time Period 06/12/21-07/20/21   ? Progress Note Due on Visit 8   ? PT Start Time 1015   ? PT Stop Time 1055   ? PT Time Calculation (min) 40 min   ? Activity Tolerance Patient tolerated treatment well   ? Behavior During Therapy [facility] for tasks assessed/performed   ? ?  ?  ? ?  ? ? ? ? ?Past Medical History:  ?Diagnosis Date  ? Anxiety   ? Arthritis   ? Asthma   ? Bursitis   ? Depression   ? GERD (gastroesophageal reflux disease)   ? Hypertension   ? Muscle spasms of both lower extremities   ? PTSD (post-traumatic stress disorder)   ? Sciatica   ? Seizures ([place])   ? Uterine fibroid   ? ?Past Surgical History:  ?Procedure Laterality Date  ? colonoscopy    ? ?Patient Active Problem List  ? Diagnosis Date Noted  ? Spinal stenosis of lumbar region with radiculopathy 01/23/2021  ? COVID-19 vaccination declined 05/16/2020  ? Prediabetes 05/16/2020  ? Obesity (BMI 35.0-39.9 without comorbidity) 05/16/2020  ? Primary osteoarthritis of left knee 07/04/2018  ? Uterine fibroid 06/09/2017  ? Endometrial stromal nodule 06/09/2017  ? ? ?REFERRING DIAG: M48.061,M54.16 (ICD-10-CM) - Spinal stenosis of lumbar region with radiculopathy  ? ?THERAPY DIAG: Spinal stenosis of lumbar region with radiculopathy  ? ? ?PERTINENT HISTORY: Sciatic pain from L4-5 stenosis, the patient has had a good intermediate term relief with translaminar epidural steroid injection L4-5 on the right side.  I do think it is worthwhile to repeat this next month.  Discussed need for driver. ?She goes to [facility] pain center where she is prescribed oxycodone ?PT ordered for  conservative care ? ?PRECAUTIONS: none noted ? ?SUBJECTIVE: Still reports low back pain and soreness but has been able to sustain activity for longer periods of time indicating improved endurance. ? ?PAIN:  ?Are you having pain? Yes ?NPRS scale: 8/10 ?Pain location: Low back ?Pain orientation: Left  ? ? ? ? ?OBJECTIVE:  ?  ?DIAGNOSTIC FINDINGS:  ?IMPRESSION: ?1. Degenerative changes of the lumbar spine with progressed ?anterolisthesis at L4-5 where there is mild-to-moderate spinal canal ?stenosis with narrowing of the bilateral subarticular zones, severe ?right and moderate left neural foraminal narrowing. ?2. Moderate right and severe left neural foraminal narrowing at ?L5-S1, unchanged. ?  ?  ?Electronically Signed ?  By: Pedro Earls M.D. ?  On: 09/08/2020 13:57 ?  ?PATIENT SURVEYS:  ?ODI 70% ?  ?SCREENING FOR RED FLAGS: ?Bowel or bladder incontinence: No ?  ?COGNITION: ?         Overall cognitive status: Within functional limits for tasks assessed              ?          ?SENSATION: ?         Light touch: Appears intact ?  ?MUSCLE LENGTH: ?Hamstrings: Right 60 deg; Left 45 deg ?Thomas test: Right low back pain; Left low back pain ?  ?POSTURE:  ?Increased lordosis ?  ?  PALPATION: ?unremarkable ?  ?LUMBARAROM/PROM ?  ?A/PROM A/PROM  ?06/12/2021  ?Flexion 50%  ?Extension 10%  ?Right lateral flexion 75%  ?Left lateral flexion 50%  ?Right rotation    ?Left rotation    ? (Blank rows = not tested) ?  ?LE AROM/PROM: ?  ?A/PROM Right ?06/12/2021 Left ?06/12/2021  ?Hip flexion 120 90  ?Hip extension      ?Hip abduction      ?Hip adduction      ?Hip internal rotation      ?Hip external rotation      ?Knee flexion 120 120  ?Knee extension 0 0  ?Ankle dorsiflexion      ?Ankle plantarflexion      ?Ankle inversion      ?Ankle eversion      ? (Blank rows = not tested) ?  ?LE MMT: ?  ?MMT Right ?06/12/2021 Left ?06/12/2021  ?Hip flexion 4 4-  ?Hip extension 4 4  ?Hip abduction      ?Hip adduction      ?Hip internal  rotation      ?Hip external rotation      ?Knee flexion 4 4  ?Knee extension 4- 4-  ?Ankle dorsiflexion      ?Ankle plantarflexion      ?Ankle inversion      ?Ankle eversion      ? (Blank rows = not tested) ?  ?LUMBAR SPECIAL TESTS:  ?Straight leg raise test: Negative, Slump test: Negative, FABER test: Negative, and Thomas test: Positive for low back pain B ?  ?FUNCTIONAL TESTS:  ?5 times sit to stand: 32s ?  ?GAIT: ?Distance walked: 32f x2 ?Assistive device utilized: Single point cane ?Level of assistance: Complete Independence ?Comments: mildly antalgic ?  ?  ?  ?TOD[facility]'S TREATMENT ?[facility]                                                [facility]: 07/[facility] ?Therapeutic Exercise: ?Nustep L5 8 min arms 6 ?Curl ups 15x2 ?90/90 hips/knees 30s x2 ?SKTC L 30s x3 ?SLR 20/20 ?Hip flexor stretch 30s x3 B ?Clamshells 30/30 ?[place] rotation 15/15 YTB ?Bridge 15x2 ? ? ?[facility]                                                [facility]: 06/30/21 ?Therapeutic Exercise: ?Nustep L1 8 min arms 6(resistance lessened due to soreness) ?Curl ups 20x ?Supine march 20/20 ?SKTC L 30s x3 ?SLR 20/20 ?Hip flexor stretch 30s x3 B ?Clamshells 20/20 ?Open book 10/10 ?Pallof press  ? ? ? ?[facility]                                                [facility]: [facility] ?Therapeutic Exercise: ?Nustep L2 8 min arms 6 ?Curl ups 20x ?Supine march 20/20 ?SKTC B 30s x3 ?SLR 20/20 ?Hip flexor stretch 30s x3 B ?LTR 30s x3 B ?Clamshells 20/20 ?Open book 10/10 ? ? ? ?  ?  ?PATIENT EDUCATION:  ?Education details: Discussed eval findings, rehab rationale and POC and patient is in  agreement  ?Person educated: Patient ?Education method: Explanation ?Education comprehension: verbalized understanding ?  ?  ?HOME EXERCISE PROGRAM: ?Access Code: [email or web address] ?URL: [email or web address] ?Date: 06/12/2021 ?Prepared by: Sharlynn Oliphant ?  ?Exercises ?Modified Thomas Stretch - 2 x daily - 7 x weekly - 1 sets - 2 reps - 30s hold ?Curl  Up with Arms Crossed - 2 x daily - 7 x weekly - 2 sets - 15 reps ?Supine March - 2 x daily - 7 x weekly - 1 sets - 15 reps ?Supine Bridge - 2 x daily - 7 x weekly - 1 sets - 15 reps ?  ?  ?ASSESSMENT: ?  ?CLINICAL IMPRESSION: Improved activity tolerance and ability to walk for longer periods of time.  Still reports symptoms with prolonged standing and walking.  Increased discomfort also noted with cleaning tasks involving bending and twisting.  Added additional strengthening tasks for core and hips to simulate sweeping tasks. ?  ?  ?OBJECTIVE IMPAIRMENTS Abnormal gait, decreased activity tolerance, decreased endurance, decreased knowledge of condition, decreased ROM, decreased strength, obesity, and pain.  ?  ?ACTIVITY LIMITATIONS cleaning, community activity, laundry, and walking/standing .  ?  ?PERSONAL FACTORS Behavior pattern, Fitness, Past/current experiences, and Time since onset of injury/illness/exacerbation are also affecting patient's functional outcome.  ?  ?  ?REHAB POTENTIAL: Fair based on chronic nature ?  ?CLINICAL DECISION MAKING: Stable/uncomplicated ?  ?EVALUATION COMPLEXITY: Low ?  ?  ?GOALS: ?Goals reviewed with patient? Yes ?  ?SHORT TERM GOALS: STGs= LTGs ?  ?  ?LONG TERM GOALS: ?  ?Patient to demonstrate independence in HEP  ?Baseline: JNKHEP3K ?Target date: 08/10/2021 ?Goal status: ONGOING ?  ?2.  120d L hip flexion ?Baseline: 90d; 06/30/21 110d PROM ?Target date: 08/10/2021 ?Goal status: Ongoing ?  ?3.  Improve ODI score to 50% ?Baseline: 70% ?Target date: 08/10/2021 ?Goal status: ONGOING ?  ?4.  Decrease 5x STS time to 25s w/o UE support ?Baseline: 32s w/o UE support; 06/30/21 21s w/o UE support ?Target date: 08/10/2021 ?Goal status: MET ?  ?PLAN: ?PT FREQUENCY: 1x/week ?  ?PT DURATION: 4 weeks ?  ?PLANNED INTERVENTIONS: Therapeutic exercises, Therapeutic activity, Neuromuscular re-education, Balance training, Gait training, Patient/Family education, Joint mobilization, and Stair training ?   ?PLAN FOR NEXT SESSION: review sweeping tasks, HEP update, STS with OH reach, UE band exercises ? ? ? ?Lanice Shirts, PT ?07/09/2021, 10:23 AM ? ?   ?

## 2021-07-16 ENCOUNTER — Ambulatory Visit: Payer: Medicaid Other | Attending: Physical Medicine & Rehabilitation

## 2021-07-16 DIAGNOSIS — G8929 Other chronic pain: Secondary | ICD-10-CM | POA: Insufficient documentation

## 2021-07-16 DIAGNOSIS — M545 Low back pain, unspecified: Secondary | ICD-10-CM | POA: Insufficient documentation

## 2021-07-16 DIAGNOSIS — M6281 Muscle weakness (generalized): Secondary | ICD-10-CM | POA: Diagnosis present

## 2021-07-16 DIAGNOSIS — M5417 Radiculopathy, lumbosacral region: Secondary | ICD-10-CM | POA: Insufficient documentation

## 2021-07-16 NOTE — Therapy (Signed)
?OUTPATIENT PHYSICAL THERAPY TREATMENT NOTE ? ? ?Patient Name: Margaret Owens ?MRN: [id number] ?DOB:08/31/1970, 50 y.o., female ?Today's Date: 07/16/2021 ? ?PCP: Johnson, Deborah B, MD ?REFERRING PROVIDER: Kirsteins, Andrew E, MD ? ? PT End of Session - 07/16/21 1146   ? ? Visit Number 6   ? Number of Visits 8   ? Date for PT Re-Evaluation 07/10/21   ? Authorization Type [facility] [facility]   ? Authorization Time Period 06/12/21-07/20/21   ? Progress Note Due on Visit 8   ? PT Start Time 1145   15min late  ? PT Stop Time 1215   ? PT Time Calculation (min) 30 min   ? Activity Tolerance Patient tolerated treatment well   ? Behavior During Therapy [facility] for tasks assessed/performed   ? ?  ?  ? ?  ? ? ? ? ?Past Medical History:  ?Diagnosis Date  ? Anxiety   ? Arthritis   ? Asthma   ? Bursitis   ? Depression   ? GERD (gastroesophageal reflux disease)   ? Hypertension   ? Muscle spasms of both lower extremities   ? PTSD (post-traumatic stress disorder)   ? Sciatica   ? Seizures (HCC)   ? Uterine fibroid   ? ?Past Surgical History:  ?Procedure Laterality Date  ? colonoscopy    ? ?Patient Active Problem List  ? Diagnosis Date Noted  ? Spinal stenosis of lumbar region with radiculopathy 01/23/2021  ? COVID-19 vaccination declined 05/16/2020  ? Prediabetes 05/16/2020  ? Obesity (BMI 35.0-39.9 without comorbidity) 05/16/2020  ? Primary osteoarthritis of left knee 07/04/2018  ? Uterine fibroid 06/09/2017  ? Endometrial stromal nodule 06/09/2017  ? ? ?REFERRING DIAG: M48.061,M54.16 (ICD-10-CM) - Spinal stenosis of lumbar region with radiculopathy  ? ?THERAPY DIAG: Spinal stenosis of lumbar region with radiculopathy  ? ? ?PERTINENT HISTORY: Sciatic pain from L4-5 stenosis, the patient has had a good intermediate term relief with translaminar epidural steroid injection L4-5 on the right side.  I do think it is worthwhile to repeat this next month.  Discussed need for driver. ?She goes to [facility] where she is prescribed oxycodone ?PT  ordered for conservative care ? ?PRECAUTIONS: none noted ? ?SUBJECTIVE: Standing tolerance has improved, still reports discomfort with sweeping tasks and prolonged standing.  Recently has R knee injected for pain relief. ? ?PAIN:  ?Are you having pain? Yes ?NPRS scale: 8/10 ?Pain location: Low back ?Pain orientation: Left  ? ? ? ? ?OBJECTIVE:  ?  ?DIAGNOSTIC FINDINGS:  ?IMPRESSION: ?1. Degenerative changes of the lumbar spine with progressed ?anterolisthesis at L4-5 where there is mild-to-moderate spinal canal ?stenosis with narrowing of the bilateral subarticular zones, severe ?right and moderate left neural foraminal narrowing. ?2. Moderate right and severe left neural foraminal narrowing at ?L5-S1, unchanged. ?  ?  ?Electronically Signed ?  By: Katyucia  De Macedo Rodrigues M.D. ?  On: 09/08/2020 13:57 ?  ?PATIENT SURVEYS:  ?ODI 70% ?  ?SCREENING FOR RED FLAGS: ?Bowel or bladder incontinence: No ?  ?COGNITION: ?         Overall cognitive status: Within functional limits for tasks assessed              ?          ?SENSATION: ?         Light touch: Appears intact ?  ?MUSCLE LENGTH: ?Hamstrings: Right 60 deg; Left 45 deg ?Thomas test: Right low back pain; Left low back pain ?  ?POSTURE:  ?  Increased lordosis ?  ?PALPATION: ?unremarkable ?  ?LUMBARAROM/PROM ?  ?A/PROM A/PROM  ?06/12/2021  ?Flexion 50%  ?Extension 10%  ?Right lateral flexion 75%  ?Left lateral flexion 50%  ?Right rotation    ?Left rotation    ? (Blank rows = not tested) ?  ?LE AROM/PROM: ?  ?A/PROM Right ?06/12/2021 Left ?06/12/2021  ?Hip flexion 120 90  ?Hip extension      ?Hip abduction      ?Hip adduction      ?Hip internal rotation      ?Hip external rotation      ?Knee flexion 120 120  ?Knee extension 0 0  ?Ankle dorsiflexion      ?Ankle plantarflexion      ?Ankle inversion      ?Ankle eversion      ? (Blank rows = not tested) ?  ?LE MMT: ?  ?MMT Right ?06/12/2021 Left ?06/12/2021  ?Hip flexion 4 4-  ?Hip extension 4 4  ?Hip abduction      ?Hip adduction       ?Hip internal rotation      ?Hip external rotation      ?Knee flexion 4 4  ?Knee extension 4- 4-  ?Ankle dorsiflexion      ?Ankle plantarflexion      ?Ankle inversion      ?Ankle eversion      ? (Blank rows = not tested) ?  ?LUMBAR SPECIAL TESTS:  ?Straight leg raise test: Negative, Slump test: Negative, FABER test: Negative, and Thomas test: Positive for low back pain B ?  ?FUNCTIONAL TESTS:  ?5 times sit to stand: 32s ?  ?GAIT: ?Distance walked: 35f x2 ?Assistive device utilized: Single point cane ?Level of assistance: Complete Independence ?Comments: mildly antalgic ?  ?  ?  ?[facility]RE[facility]                                                [facility] ?Therapeutic Exercise: ?Nustep L4 8 min arms 8 ?Deadlift with KB 10# with and without weight 5 reps ?Sidebending with KB 10# 10/10 ?Trunk rotations in standing 10# KB 10/10 ?Open book 10/10 ?90/90 hips/knees 30s x2 ?5x STS with OH reach  ?Bridge 15x1 ? ?[facility]                                                [facility]: 07[facility]21 ?Therapeutic Exercise: ?Nustep L5 8 min arms 6 ?Curl ups 15x2 ?90/90 hips/knees 30s x2 ?SKTC L 30s x3 ?SLR 20/20 ?Hip flexor stretch 30s x3 B ?Clamshells 30/30 ?Pallof rotation 15/15 YTB ?Bridge 15x2 ? ? ?[facility]                                                [facility]: [facility]21 ?Therapeutic Exercise: ?Nustep L1 8 min arms 6(resistance lessened due to soreness) ?Curl ups 20x ?Supine march 20/20 ?SKTC L 30s x3 ?SLR 20/20 ?Hip flexor stretch 30s x3 B ?Clamshells 20/20 ?Open book 10/10 ?Pallof press  ? ? ? ? ?  ?  ?PATIENT EDUCATION:  ?  Education details: Discussed eval findings, rehab rationale and POC and patient is in agreement  ?Person educated: Patient ?Education method: Explanation ?Education comprehension: verbalized understanding ?  ?  ?HOME EXERCISE PROGRAM: ?Access Code: [email or web address] ?URL: [email or web address] ?Date: 06/12/2021 ?Prepared by: Sharlynn Oliphant ?   ?Exercises ?Modified Thomas Stretch - 2 x daily - 7 x weekly - 1 sets - 2 reps - 30s hold ?Curl Up with Arms Crossed - 2 x daily - 7 x weekly - 2 sets - 15 reps ?Supine March - 2 x daily - 7 x weekly - 1 sets - 15 reps ?Supine Bridge - 2 x daily - 7 x weekly - 1 sets - 15 reps ?  ?  ?ASSESSMENT: ?  ?CLINICAL IMPRESSION: Patient arrived late for session, focus of today's session was functional tasks to address continued pain with lifting and twisting activities.  Added KB deadlifts, KB SB and KB rotations, continued bridging and rotational stretches, STS with OH reach to improve strength and standing posture ?  ?OBJECTIVE IMPAIRMENTS Abnormal gait, decreased activity tolerance, decreased endurance, decreased knowledge of condition, decreased ROM, decreased strength, obesity, and pain.  ?  ?ACTIVITY LIMITATIONS cleaning, community activity, laundry, and walking/standing .  ?  ?PERSONAL FACTORS Behavior pattern, Fitness, Past/current experiences, and Time since onset of injury/illness/exacerbation are also affecting patient's functional outcome.  ?  ?  ?REHAB POTENTIAL: Fair based on chronic nature ?  ?CLINICAL DECISION MAKING: Stable/uncomplicated ?  ?EVALUATION COMPLEXITY: Low ?  ?  ?GOALS: ?Goals reviewed with patient? Yes ?  ?SHORT TERM GOALS: STGs= LTGs ?  ?  ?LONG TERM GOALS: ?  ?Patient to demonstrate independence in HEP  ?Baseline: JNKHEP3K ?Target date: 08/10/2021 ?Goal status: ONGOING ?  ?2.  120d L hip flexion ?Baseline: 90d; 06/30/21 110d PROM ?Target date: 08/10/2021 ?Goal status: Ongoing ?  ?3.  Improve ODI score to 50% ?Baseline: 70% ?Target date: 08/10/2021 ?Goal status: ONGOING ?  ?4.  Decrease 5x STS time to 25s w/o UE support ?Baseline: 32s w/o UE support; 06/30/21 21s w/o UE support ?Target date: 08/10/2021 ?Goal status: MET ?  ?PLAN: ?PT FREQUENCY: 1x/week ?  ?PT DURATION: 4 weeks ?  ?PLANNED INTERVENTIONS: Therapeutic exercises, Therapeutic activity, Neuromuscular re-education, Balance training, Gait  training, Patient/Family education, Joint mobilization, and Stair training ?  ?PLAN FOR NEXT SESSION: review sweeping tasks, HEP update, STS with OH reach, UE band exercises ? ? ? ?Lanice Shirts, PT ?07/16/2021, 11:4

## 2021-07-23 ENCOUNTER — Ambulatory Visit: Payer: Medicaid Other

## 2021-07-23 DIAGNOSIS — M5417 Radiculopathy, lumbosacral region: Secondary | ICD-10-CM

## 2021-07-23 DIAGNOSIS — M545 Low back pain, unspecified: Secondary | ICD-10-CM

## 2021-07-23 DIAGNOSIS — M6281 Muscle weakness (generalized): Secondary | ICD-10-CM

## 2021-07-23 NOTE — Therapy (Signed)
?OUTPATIENT PHYSICAL THERAPY TREATMENT NOTE ? ? ?Patient Name: Margaret Owens ?MRN: [id number] ?DOB:03-26-1971, 51 y.o., female ?Today's Date: 07/23/2021 ? ?PCP: Ladell Pier, MD ?REFERRING PROVIDER: Charlett Blake, MD ? ? PT End of Session - 07/23/21 1139   ? ? Visit Number 7   ? Number of Visits 8   ? Date for PT Re-Evaluation 07/10/21   ? Authorization Type [facility] [facility]   ? Authorization Time Period 06/12/21-07/20/21   ? Progress Note Due on Visit 8   ? PT Start Time 1140   ? PT Stop Time 1210   ? PT Time Calculation (min) 30 min   ? Activity Tolerance Patient tolerated treatment well   ? Behavior During Therapy [facility] for tasks assessed/performed   ? ?  ?  ? ?  ? ? ? ? ?Past Medical History:  ?Diagnosis Date  ? Anxiety   ? Arthritis   ? Asthma   ? Bursitis   ? Depression   ? GERD (gastroesophageal reflux disease)   ? Hypertension   ? Muscle spasms of both lower extremities   ? PTSD (post-traumatic stress disorder)   ? Sciatica   ? Seizures ([place])   ? Uterine fibroid   ? ?Past Surgical History:  ?Procedure Laterality Date  ? colonoscopy    ? ?Patient Active Problem List  ? Diagnosis Date Noted  ? Spinal stenosis of lumbar region with radiculopathy 01/23/2021  ? COVID-19 vaccination declined 05/16/2020  ? Prediabetes 05/16/2020  ? Obesity (BMI 35.0-39.9 without comorbidity) 05/16/2020  ? Primary osteoarthritis of left knee 07/04/2018  ? Uterine fibroid 06/09/2017  ? Endometrial stromal nodule 06/09/2017  ? ? ?REFERRING DIAG: M48.061,M54.16 (ICD-10-CM) - Spinal stenosis of lumbar region with radiculopathy  ? ?THERAPY DIAG: Spinal stenosis of lumbar region with radiculopathy  ? ? ?PERTINENT HISTORY: Sciatic pain from L4-5 stenosis, the patient has had a good intermediate term relief with translaminar epidural steroid injection L4-5 on the right side.  I do think it is worthwhile to repeat this next month.  Discussed need for driver. ?She goes to [facility] pain center where she is prescribed oxycodone ?PT ordered for  conservative care ? ?PRECAUTIONS: none noted ? ?SUBJECTIVE: Continued symptoms, unable to note any changes in low back.  Had L knee injected which helped with knee symptoms.  Rates pain at 10/10 despite being able to take kids to the park which entails walking and pushing strollers ? ?PAIN:  ?Are you having pain? Yes ?NPRS scale: 8/10 ?Pain location: Low back ?Pain orientation: Left  ? ? ? ? ?OBJECTIVE:  ?  ?DIAGNOSTIC FINDINGS:  ?IMPRESSION: ?1. Degenerative changes of the lumbar spine with progressed ?anterolisthesis at L4-5 where there is mild-to-moderate spinal canal ?stenosis with narrowing of the bilateral subarticular zones, severe ?right and moderate left neural foraminal narrowing. ?2. Moderate right and severe left neural foraminal narrowing at ?L5-S1, unchanged. ?  ?  ?Electronically Signed ?  By: Pedro Earls M.D. ?  On: 09/08/2020 13:57 ?  ?PATIENT SURVEYS:  ?ODI 70% ?  ?SCREENING FOR RED FLAGS: ?Bowel or bladder incontinence: No ?  ?COGNITION: ?         Overall cognitive status: Within functional limits for tasks assessed              ?          ?SENSATION: ?         Light touch: Appears intact ?  ?MUSCLE LENGTH: ?Hamstrings: Right 60 deg; Left 45  deg ?Marcello Moores test: Right low back pain; Left low back pain ?  ?POSTURE:  ?Increased lordosis ?  ?PALPATION: ?unremarkable ?  ?LUMBARAROM/PROM ?  ?A/PROM A/PROM  ?06/12/2021  ?Flexion 50%  ?Extension 10%  ?Right lateral flexion 75%  ?Left lateral flexion 50%  ?Right rotation    ?Left rotation    ? (Blank rows = not tested) ?  ?LE AROM/PROM: ?  ?A/PROM Right ?06/12/2021 Left ?06/12/2021  ?Hip flexion 120 90  ?Hip extension      ?Hip abduction      ?Hip adduction      ?Hip internal rotation      ?Hip external rotation      ?Knee flexion 120 120  ?Knee extension 0 0  ?Ankle dorsiflexion      ?Ankle plantarflexion      ?Ankle inversion      ?Ankle eversion      ? (Blank rows = not tested) ?  ?LE MMT: ?  ?MMT Right ?06/12/2021 Left ?06/12/2021  ?Hip flexion 4  4-  ?Hip extension 4 4  ?Hip abduction      ?Hip adduction      ?Hip internal rotation      ?Hip external rotation      ?Knee flexion 4 4  ?Knee extension 4- 4-  ?Ankle dorsiflexion      ?Ankle plantarflexion      ?Ankle inversion      ?Ankle eversion      ? (Blank rows = not tested) ?  ?LUMBAR SPECIAL TESTS:  ?Straight leg raise test: Negative, Slump test: Negative, FABER test: Negative, and Thomas test: Positive for low back pain B ?  ?FUNCTIONAL TESTS:  ?5 times sit to stand: 32s ?  ?GAIT: ?Distance walked: 75f x2 ?Assistive device utilized: Single point cane ?Level of assistance: Complete Independence ?Comments: mildly antalgic ?  ?  ?  ?[facility] TREATMENT [facility]                                                [facility]: [facility] ?Therapeutic Exercise: ?Nustep L2 8 min arms 8 ?Deadlift with KB 10# 10x ?Sidebending with KB 10# 10/10 ?Trunk rotations in standing 10# KB 10/10 ?Open book 10/10 ?90/90 hips/knees 30s x2 ?10x STS with OH reach  ? ?[facility]                                                [facility]: 07/16/21 ?Therapeutic Exercise: ?Nustep L4 8 min arms 8 ?Deadlift with KB 10# with and without weight 5 reps ?Sidebending with KB 10# 10/10 ?Trunk rotations in standing 10# KB 10/10 ?Open book 10/10 ?90/90 hips/knees 30s x2 ?5x STS with OH reach  ?Bridge 15x1 ? ?[facility]                                                DATE: 07/09/21 ?Therapeutic Exercise: ?Nustep L5 8 min arms 6 ?Curl ups 15x2 ?90/90 hips/knees 30s x2 ?SKTC L 30s x3 ?SLR 20/20 ?Hip flexor stretch 30s x3 B ?Clamshells 30/30 ?Pallof rotation 15/15  YTB ?Bridge 15x2 ? ? ? ? ? ?  ?  ?PATIENT EDUCATION:  ?Education details: Discussed eval findings, rehab rationale and POC and patient is in agreement  ?Person educated: Patient ?Education method: Explanation ?Education comprehension: verbalized understanding ?  ?  ?HOME EXERCISE PROGRAM: ?Access Code: [email or web address] ?URL: [email or web address] ?Date:  06/12/2021 ?Prepared by: Sharlynn Oliphant ?  ?Exercises ?Modified Thomas Stretch - 2 x daily - 7 x weekly - 1 sets - 2 reps - 30s hold ?Curl Up with Arms Crossed - 2 x daily - 7 x weekly - 2 sets - 15 reps ?Supine March - 2 x daily - 7 x weekly - 1 sets - 15 reps ?Supine Bridge - 2 x daily - 7 x weekly - 1 sets - 15 reps ?  ?  ?ASSESSMENT: ?  ?CLINICAL IMPRESSION: Patient arrived late for session again.  Cites 10/10 pain despite ability to spend day with children walking and pushing strollers.  Continued previous tasks of functional strengthening and Arts development officer training  ?  ?OBJECTIVE IMPAIRMENTS Abnormal gait, decreased activity tolerance, decreased endurance, decreased knowledge of condition, decreased ROM, decreased strength, obesity, and pain.  ?  ?ACTIVITY LIMITATIONS cleaning, community activity, laundry, and walking/standing .  ?  ?PERSONAL FACTORS Behavior pattern, Fitness, Past/current experiences, and Time since onset of injury/illness/exacerbation are also affecting patient's functional outcome.  ?  ?  ?REHAB POTENTIAL: Fair based on chronic nature ?  ?CLINICAL DECISION MAKING: Stable/uncomplicated ?  ?EVALUATION COMPLEXITY: Low ?  ?  ?GOALS: ?Goals reviewed with patient? Yes ?  ?SHORT TERM GOALS: STGs= LTGs ?  ?  ?LONG TERM GOALS: ?  ?Patient to demonstrate independence in HEP  ?Baseline: [id number] ?Target date: 08/10/2021 ?Goal status: ONGOING ?  ?2.  120d L hip flexion ?Baseline: 90d; 06/30/21 110d PROM ?Target date: 08/10/2021 ?Goal status: Ongoing ?  ?3.  Improve ODI score to 50% ?Baseline: 70% ?Target date: 08/10/2021 ?Goal status: ONGOING ?  ?4.  Decrease 5x STS time to 25s w/o UE support ?Baseline: 32s w/o UE support; 06/30/21 21s w/o UE support ?Target date: 08/10/2021 ?Goal status: MET ?  ?PLAN: ?PT FREQUENCY: 1x/week ?  ?PT DURATION: 4 weeks ?  ?PLANNED INTERVENTIONS: Therapeutic exercises, Therapeutic activity, Neuromuscular re-education, Balance training, Gait training, Patient/Family education,  Joint mobilization, and Stair training ?  ?PLAN FOR NEXT SESSION: Assess for DC based on lack of progress/ maximum potential  ? ? ?Lanice Shirts, PT ?07/23/2021, 11:40 AM ? ?   ?

## 2021-07-30 ENCOUNTER — Ambulatory Visit: Payer: Medicaid Other

## 2021-08-06 ENCOUNTER — Ambulatory Visit: Payer: Medicaid Other

## 2021-08-06 DIAGNOSIS — M6281 Muscle weakness (generalized): Secondary | ICD-10-CM

## 2021-08-06 DIAGNOSIS — M545 Low back pain, unspecified: Secondary | ICD-10-CM

## 2021-08-06 DIAGNOSIS — M5417 Radiculopathy, lumbosacral region: Secondary | ICD-10-CM

## 2021-08-06 NOTE — Therapy (Signed)
?   ?OUTPATIENT PHYSICAL THERAPY TREATMENT NOTE/DC SUMMARY ? ? ?Patient Name: Margaret Owens ?MRN: [id number] ?DOB:07-Oct-1970, 51 y.o., female ?Today's Date: 08/06/2021 ? ?PCP: Ladell Pier, MD ?REFERRING PROVIDER: Charlett Blake, MD ? ?PHYSICAL THERAPY DISCHARGE SUMMARY ? ?Visits from Start of Care: 8 ? ?Current functional level related to goals / functional outcomes: ?Goals partially met ?  ?Remaining deficits: ?Pain  ?  ?Education / Equipment: ?HEP  ? ?Patient agrees to discharge. Patient goals were partially met. Patient is being discharged due to maximized rehab potential.   ? ? PT End of Session - 08/06/21 1615   ? ? Visit Number 8   ? Number of Visits 8   ? Date for PT Re-Evaluation 07/10/21   ? Authorization Type [facility]   ? Authorization Time Period 06/12/21-07/20/21   ? Progress Note Due on Visit 8   ? PT Start Time 1615   ? PT Stop Time 1700   ? PT Time Calculation (min) 45 min   ? Activity Tolerance Patient tolerated treatment well   ? Behavior During Therapy [facility] for tasks assessed/performed   ? ?  ?  ? ?  ? ? ? ? ?Past Medical History:  ?Diagnosis Date  ? Anxiety   ? Arthritis   ? Asthma   ? Bursitis   ? Depression   ? GERD (gastroesophageal reflux disease)   ? Hypertension   ? Muscle spasms of both lower extremities   ? PTSD (post-traumatic stress disorder)   ? Sciatica   ? Seizures ([place])   ? Uterine fibroid   ? ?Past Surgical History:  ?Procedure Laterality Date  ? colonoscopy    ? ?Patient Active Problem List  ? Diagnosis Date Noted  ? Spinal stenosis of lumbar region with radiculopathy 01/23/2021  ? COVID-19 vaccination declined 05/16/2020  ? Prediabetes 05/16/2020  ? Obesity (BMI 35.0-39.9 without comorbidity) 05/16/2020  ? Primary osteoarthritis of left knee 07/04/2018  ? Uterine fibroid 06/09/2017  ? Endometrial stromal nodule 06/09/2017  ? ? ?REFERRING DIAG: M48.061,M54.16 (ICD-10-CM) - Spinal stenosis of lumbar region with radiculopathy  ? ?THERAPY DIAG: Spinal stenosis of lumbar  region with radiculopathy  ? ? ?PERTINENT HISTORY: Sciatic pain from L4-5 stenosis, the patient has had a good intermediate term relief with translaminar epidural steroid injection L4-5 on the right side.  I do think it is worthwhile to repeat this next month.  Discussed need for driver. ?She goes to [facility] pain center where she is prescribed oxycodone ?PT ordered for conservative care ? ?PRECAUTIONS: none noted ? ?SUBJECTIVE: Overall decreased pain reported as well as ability to tolerate standing and walking for longer periods ? ?PAIN:  ?Are you having pain? Yes ?NPRS scale: 6/10 ?Pain location: Low back ?Pain orientation: Left  ? ? ? ? ?OBJECTIVE:  ?  ?DIAGNOSTIC FINDINGS:  ?IMPRESSION: ?1. Degenerative changes of the lumbar spine with progressed ?anterolisthesis at L4-5 where there is mild-to-moderate spinal canal ?stenosis with narrowing of the bilateral subarticular zones, severe ?right and moderate left neural foraminal narrowing. ?2. Moderate right and severe left neural foraminal narrowing at ?L5-S1, unchanged. ?  ?  ?Electronically Signed ?  By: Pedro Earls M.D. ?  On: 09/08/2020 13:57 ?  ?PATIENT SURVEYS:  ?ODI 70% ?  ?SCREENING FOR RED FLAGS: ?Bowel or bladder incontinence: No ?  ?COGNITION: ?         Overall cognitive status: Within functional limits for tasks assessed              ?          ?  SENSATION: ?         Light touch: Appears intact ?  ?MUSCLE LENGTH: ?Hamstrings: Right 60 deg; Left 45 deg ?Thomas test: Right low back pain; Left low back pain ?  ?POSTURE:  ?Increased lordosis ?  ?PALPATION: ?unremarkable ?  ?LUMBARAROM/PROM ?  ?A/PROM A/PROM  ?06/12/2021  ?Flexion 50%  ?Extension 10%  ?Right lateral flexion 75%  ?Left lateral flexion 50%  ?Right rotation    ?Left rotation    ? (Blank rows = not tested) ?  ?LE AROM/PROM: ?  ?A/PROM Right ?06/12/2021 Left ?06/12/2021  ?Hip flexion 120 90  ?Hip extension      ?Hip abduction      ?Hip adduction      ?Hip internal rotation      ?Hip  external rotation      ?Knee flexion 120 120  ?Knee extension 0 0  ?Ankle dorsiflexion      ?Ankle plantarflexion      ?Ankle inversion      ?Ankle eversion      ? (Blank rows = not tested) ?  ?LE MMT: ?  ?MMT Right ?06/12/2021 Left ?06/12/2021  ?Hip flexion 4 4-  ?Hip extension 4 4  ?Hip abduction      ?Hip adduction      ?Hip internal rotation      ?Hip external rotation      ?Knee flexion 4 4  ?Knee extension 4- 4-  ?Ankle dorsiflexion      ?Ankle plantarflexion      ?Ankle inversion      ?Ankle eversion      ? (Blank rows = not tested) ?  ?LUMBAR SPECIAL TESTS:  ?Straight leg raise test: Negative, Slump test: Negative, FABER test: Negative, and Thomas test: Positive for low back pain B ?  ?FUNCTIONAL TESTS:  ?5 times sit to stand: 32s ?  ?GAIT: ?Distance walked: 21f x2 ?Assistive device utilized: Single point cane ?Level of assistance: Complete Independence ?Comments: mildly antalgic ?  ?  ?  ?[facility]'S TREATMENT [facility]                                                [facility] ?Therapeutic Exercise: ?Nustep L2 8 min arms 8 ?Deadlift with KB 10# 10x ?Sidebending with KB 10# 10/10 ?Trunk rotations in standing 10# KB 10/10 ?Open book 10/10 ?90/90 hips/knees 30s x2 ?5x STS w/UE Support 20s ? [facility]                                                [facility] ?Therapeutic Exercise: ?Nustep L2 8 min arms 8 ?Deadlift with KB 10# 10x ?Sidebending with KB 10# 10/10 ?Trunk rotations in standing 10# KB 10/10 ?Open book 10/10 ?90/90 hips/knees 30s x2 ?10x STS with OH reach  ? ?[facility]                                                [facility]: [facility]16[facility] ?Therapeutic Exercise: ?Nustep L4 8 min arms 8 ?Deadlift with KB 10# with and without weight  5 reps ?Sidebending with KB 10# 10/10 ?Trunk rotations in standing 10# KB 10/10 ?Open book 10/10 ?90/90 hips/knees 30s x2 ?5x STS with OH reach  ?Bridge 15x1 ? ? ? ? ? ?  ?  ?PATIENT EDUCATION:  ?Education details: Discussed eval findings,  rehab rationale and POC and patient is in agreement  ?Person educated: Patient ?Education method: Explanation ?Education comprehension: verbalized understanding ?  ?  ?HOME EXERCISE PROGRAM: ?Access Code: [email or web address] ?URL: [email or web address] ?Date: 08/06/2021 ?Prepared by: Sharlynn Oliphant ? ?Exercises ?- Modified Thomas Stretch  - 2 x daily - 5 x weekly - 1 sets - 2 reps - 30s hold ?- Curl Up with Arms Crossed  - 2 x daily - 5 x weekly - 2 sets - 15 reps ?- Supine 90/90 Abdominal Bracing  - 2 x daily - 5 x weekly - 1 sets - 2 reps - 30s hold ?- Sit to Stand with Arms Crossed  - 2 x daily - 5 x weekly - 1 sets - 5 reps ?- Sidelying Open Book Thoracic Lumbar Rotation and Extension  - 2 x daily - 5 x weekly - 1 sets - 10 reps ?  ?  ?ASSESSMENT: ?  ?CLINICAL IMPRESSION: rehab goals met or maximum potential reached at this time, patient agrees to DC to HEP ?  ?OBJECTIVE IMPAIRMENTS Abnormal gait, decreased activity tolerance, decreased endurance, decreased knowledge of condition, decreased ROM, decreased strength, obesity, and pain.  ?  ?ACTIVITY LIMITATIONS cleaning, community activity, laundry, and walking/standing .  ?  ?PERSONAL FACTORS Behavior pattern, Fitness, Past/current experiences, and Time since onset of injury/illness/exacerbation are also affecting patient's functional outcome.  ?  ?  ?REHAB POTENTIAL: Fair based on chronic nature ?  ?CLINICAL DECISION MAKING: Stable/uncomplicated ?  ?EVALUATION COMPLEXITY: Low ?  ?  ?GOALS: ?Goals reviewed with patient? Yes ?  ?SHORT TERM GOALS: STGs= LTGs ?  ?  ?LONG TERM GOALS: ?  ?Patient to demonstrate independence in HEP  ?Baseline: [id number] ?Target date: 08/10/2021 ?Goal status: Met ?  ?2.  120d L hip flexion ?Baseline: 90d; 06/30/21 110d PROM; 08/06/21 130d B ?Target date: 08/10/2021 ?Goal status: Met ?  ?3.  Improve ODI score to 50% ?Baseline: 70%; 08/06/21 64% ?Target date: 08/10/2021 ?Goal status: Partially met ?  ?4.  Decrease 5x STS time to 25s w/o UE  support ?Baseline: 32s w/o UE support; 06/30/21 21s w/o UE support ?Target date: 08/10/2021 ?Goal status: MET ?  ?PLAN: ?PT FREQUENCY: 1x/week ?  ?PT DURATION: 4 weeks ?  ?PLANNED INTERVENTIONS: Therapeutic exerc

## 2021-09-01 ENCOUNTER — Other Ambulatory Visit: Payer: Self-pay

## 2021-09-01 ENCOUNTER — Encounter (HOSPITAL_BASED_OUTPATIENT_CLINIC_OR_DEPARTMENT_OTHER): Payer: Self-pay

## 2021-09-01 DIAGNOSIS — D75839 Thrombocytosis, unspecified: Secondary | ICD-10-CM | POA: Diagnosis not present

## 2021-09-01 DIAGNOSIS — Z79899 Other long term (current) drug therapy: Secondary | ICD-10-CM | POA: Diagnosis not present

## 2021-09-01 DIAGNOSIS — D72829 Elevated white blood cell count, unspecified: Secondary | ICD-10-CM | POA: Diagnosis not present

## 2021-09-01 DIAGNOSIS — I10 Essential (primary) hypertension: Secondary | ICD-10-CM | POA: Diagnosis not present

## 2021-09-01 DIAGNOSIS — R109 Unspecified abdominal pain: Secondary | ICD-10-CM | POA: Diagnosis not present

## 2021-09-01 DIAGNOSIS — R112 Nausea with vomiting, unspecified: Secondary | ICD-10-CM | POA: Diagnosis not present

## 2021-09-01 DIAGNOSIS — R7309 Other abnormal glucose: Secondary | ICD-10-CM | POA: Diagnosis not present

## 2021-09-01 DIAGNOSIS — Z91040 Latex allergy status: Secondary | ICD-10-CM | POA: Diagnosis not present

## 2021-09-01 DIAGNOSIS — R1110 Vomiting, unspecified: Secondary | ICD-10-CM | POA: Diagnosis present

## 2021-09-01 LAB — LIPASE, BLOOD: Lipase: 25 U/L (ref 11–51)

## 2021-09-01 LAB — COMPREHENSIVE METABOLIC PANEL
ALT: 13 U/L (ref 0–44)
AST: 17 U/L (ref 15–41)
Albumin: 3.9 g/dL (ref 3.5–5.0)
Alkaline Phosphatase: 71 U/L (ref 38–126)
Anion gap: 6 (ref 5–15)
BUN: 7 mg/dL (ref 6–20)
CO2: 19 mmol/L — ABNORMAL LOW (ref 22–32)
Calcium: 8.7 mg/dL — ABNORMAL LOW (ref 8.9–10.3)
Chloride: 114 mmol/L — ABNORMAL HIGH (ref 98–111)
Creatinine, Ser: 0.63 mg/dL (ref 0.44–1.00)
GFR, Estimated: >60 mL/min (ref >60)
Glucose, Bld: 127 mg/dL — ABNORMAL HIGH (ref 70–99)
Potassium: 3.7 mmol/L (ref 3.5–5.1)
Sodium: 139 mmol/L (ref 135–145)
Total Bilirubin: 0.5 mg/dL (ref 0.3–1.2)
Total Protein: 8.1 g/dL (ref 6.5–8.1)

## 2021-09-01 LAB — URINALYSIS, ROUTINE W REFLEX MICROSCOPIC
Bilirubin Urine: NEGATIVE
Glucose, UA: NEGATIVE mg/dL
Hgb urine dipstick: NEGATIVE
Ketones, ur: NEGATIVE mg/dL
Leukocytes,Ua: NEGATIVE
Nitrite: NEGATIVE
Protein, ur: NEGATIVE mg/dL
Specific Gravity, Urine: >=1.03 (ref 1.005–1.030)
pH: 6 (ref 5.0–8.0)

## 2021-09-01 LAB — CBC
HCT: 40.1 % (ref 36.0–46.0)
Hemoglobin: 13.4 g/dL (ref 12.0–15.0)
MCH: 28.5 pg (ref 26.0–34.0)
MCHC: 33.4 g/dL (ref 30.0–36.0)
MCV: 85.3 fL (ref 80.0–100.0)
Platelets: 471 10*3/uL — ABNORMAL HIGH (ref 150–400)
RBC: 4.7 MIL/uL (ref 3.87–5.11)
RDW: 14.5 % (ref 11.5–15.5)
WBC: 15.5 10*3/uL — ABNORMAL HIGH (ref 4.0–10.5)
nRBC: 0 % (ref 0.0–0.2)

## 2021-09-01 LAB — PREGNANCY, URINE: Preg Test, Ur: NEGATIVE

## 2021-09-01 MED ORDER — ONDANSETRON 4 MG PO TBDP
4.0000 mg | ORAL_TABLET | Freq: Once | ORAL | Status: AC | PRN
  Administered 2021-09-01: 4 mg via ORAL
  Filled 2021-09-01: qty 1

## 2021-09-01 NOTE — ED Triage Notes (Signed)
Pt c/o abd cramping and emesis that started a few hours PTA. Pt reports vomiting 10-12 times. Pt has some ondansetron from last month, but pt states she was unable to take it.

## 2021-09-02 ENCOUNTER — Emergency Department (HOSPITAL_BASED_OUTPATIENT_CLINIC_OR_DEPARTMENT_OTHER)
Admission: EM | Admit: 2021-09-02 | Discharge: 2021-09-02 | Disposition: A | Discharge status: Home / Self Care | Payer: Medicaid Other | Attending: Emergency Medicine | Admitting: Emergency Medicine

## 2021-09-02 DIAGNOSIS — R112 Nausea with vomiting, unspecified: Secondary | ICD-10-CM

## 2021-09-02 MED ORDER — ONDANSETRON HCL 4 MG/2ML IJ SOLN
4.0000 mg | Freq: Once | INTRAMUSCULAR | Status: AC
  Administered 2021-09-02: 4 mg via INTRAVENOUS
  Filled 2021-09-02: qty 2

## 2021-09-02 MED ORDER — ONDANSETRON 8 MG PO TBDP
4.0000 mg | ORAL_TABLET | Freq: Three times a day (TID) | ORAL | 0 refills | Status: AC | PRN
Start: 2021-09-02 — End: ?

## 2021-09-02 MED ORDER — ONDANSETRON HCL 4 MG/2ML IJ SOLN: 4.0000 mg | Freq: Once | INTRAMUSCULAR | Status: DC

## 2021-09-02 MED ORDER — LACTATED RINGERS IV BOLUS
1000.0000 mL | Freq: Once | INTRAVENOUS | Status: AC
  Administered 2021-09-02: 1000 mL via INTRAVENOUS

## 2021-09-02 MED ORDER — LACTATED RINGERS IV BOLUS: 1000.0000 mL | Freq: Once | INTRAVENOUS | Status: DC

## 2021-09-02 NOTE — ED Provider Notes (Signed)
[place] EMERGENCY DEPARTMENT Provider Note   CSN: [id number] Arrival date & time: 09/01/21  2225     History  Chief Complaint  Patient presents with   Emesis    Margaret Owens is a 51 y.o. female.  The history is provided by the patient.  Emesis She has history of hypertension, prediabetes and comes in because of vomiting which started this afternoon.  She has vomited multiple times.  She denies any blood or mucus in her emesis.  She denies any diarrhea.  She has had some mild abdominal cramping and soreness.  She denies fever, chills, sweats.  She denies any sick contacts.  She denies any suspicious food intake.   Home Medications Prior to Admission medications   Medication Sig Start Date End Date Taking? Authorizing Provider  albuterol (VENTOLIN HFA) 108 (90 Base) MCG/ACT inhaler Inhale 2 puffs into the lungs every 6 (six) hours as needed for wheezing (wheezing). 10/05/18   Fulp, Ander Gaster, MD  budesonide-formoterol (SYMBICORT) 160-4.5 MCG/ACT inhaler 2 puffs twice daily to control asthma 10/05/18   Fulp, Cammie, MD  buprenorphine (BUTRANS) 7.5 MCG/HR Place 1 patch onto the skin once a week. 11/28/20     cetirizine (ZYRTEC) 10 MG tablet Take 1 tablet (10 mg total) by mouth daily. 10/05/18   Fulp, Cammie, MD  Cholecalciferol (VITAMIN D3) 50 MCG (2000 UT) capsule SMARTSIG:By Mouth 04/11/20   [provider]  cyclobenzaprine (FLEXERIL) 10 MG tablet Take 10 mg by mouth 3 (three) times daily as needed. 03/05/21   [provider]  cyclobenzaprine (FLEXERIL) 5 MG tablet Take 1 tablet (5 mg total) by mouth 3 (three) times daily as needed for muscle spasms. 10/05/18   Fulp, Cammie, MD  desonide (DESOWEN) 0.05 % cream Apply topically 2 (two) times daily as needed. 07/15/20   Nolene Ebbs, MD  diclofenac sodium (VOLTAREN) 1 % GEL Apply 4 grams every 6 hours (or 4 times per day) to the knees if needed for pain 09/10/18   Drenda Freeze, MD  dicyclomine (BENTYL) 10  MG capsule TAKE 1 CAPSULE BY MOUTH 3 TIMES DAILY AS NEEDED FOR ABDOMINAL CRAMPS/GAS 07/20/19 07/19/20  Nolene Ebbs, MD  DULoxetine (CYMBALTA) 30 MG capsule Take 30 mg by mouth 2 (two) times daily.    [provider]  famotidine (PEPCID) 20 MG tablet Take 20 mg by mouth 2 (two) times daily. 03/10/21   [provider]  ferrous sulfate 324 (65 Fe) MG TBEC Take 1 tablet by mouth daily 09/26/20     fluticasone (FLONASE) 50 MCG/ACT nasal spray Place 2 sprays into both nostrils daily. 12/17/17   Fulp, Cammie, MD  furosemide (LASIX) 20 MG tablet Take 20 mg by mouth daily. 01/09/20   [provider]  furosemide (LASIX) 20 MG tablet TAKE 1 TABLET BY MOUTH EVERY DAY AS NEEDED FOR LEG SWELLING 09/07/19 09/06/20  Nolene Ebbs, MD  gabapentin (NEURONTIN) 300 MG capsule Take 1 (one) Capsule by mouth four times daily, as needed 10/02/20     hydrochlorothiazide (HYDRODIURIL) 50 MG tablet Take 50 mg by mouth daily as needed. 05/13/21   [provider]  hydrocortisone 2.5 % cream SMARTSIG:Sparingly Topical 1-2 Times Daily 02/19/21   [provider]  hydrOXYzine (ATARAX/VISTARIL) 25 MG tablet Take 1 tablet (25 mg total) by mouth every 8 (eight) hours as needed. 01/03/21   Quintella Reichert, MD  LINZESS 290 MCG CAPS capsule Take 290 mcg by mouth daily. 08/03/19   [provider]  MAG-G 500 (  27 Mg) MG TABS Take by mouth. 04/11/20   [provider]  meloxicam (MOBIC) 15 MG tablet Take 1 tablet (15 mg total) by mouth daily. As needed for pain. Take after eating 10/05/18   Fulp, Cammie, MD  metFORMIN (GLUCOPHAGE) 500 MG tablet TAKE 1 TABLET BY MOUTH ONCE DAILY IN [facility] 04/01/20 04/01/21  Simona Huh, NP  Na Sulfate-K Sulfate-Mg Sulf (SUPREP BOWEL PREP KIT) 17.5-3.13-1.6 GM/177ML SOLN Take 354 Milliliter as directed on prep sheet 10/09/20     naloxone (NARCAN) nasal spray 4 mg/0.1 mL Call 911, Place 1 spray into one nostril. Repeat every 3 minutes as needed if no or minimal  response. 10/03/20     naproxen (NAPROSYN) 500 MG tablet Take 1 tablet (500 mg total) by mouth 2 (two) times daily. 08/15/19   Garald Balding, PA-C  NARCAN 4 MG/0.1ML LIQD nasal spray kit 1 spray once. 02/06/20   [provider]  ondansetron (ZOFRAN ODT) 4 MG disintegrating tablet Take 1 tablet (4 mg total) by mouth every 8 (eight) hours as needed. 02/24/20   Isla Pence, MD  oxyCODONE-acetaminophen (PERCOCET) 10-325 MG tablet Take 1 (one) Tablet by mouth four times daily, as needed 10/02/20     oxyCODONE-acetaminophen (PERCOCET) 10-325 MG tablet Take 1 tablet by mouth 4 (four) times daily as needed. 11/28/20     pantoprazole (PROTONIX) 40 MG tablet Take 1 tablet (40 mg total) by mouth 2 (two) times daily. 10/05/18   Fulp, Cammie, MD  phentermine 37.5 MG capsule Take 1 (one) Capsule by mouth daily 09/26/20     polyethylene glycol (MIRALAX) 17 g packet Take 17 g by mouth daily. 03/28/19   Fransico Meadow, PA-C  PROAIR HFA 108 [phone] Base) MCG/ACT inhaler INHALE 2 PUFFS INTO THE LUNGS 4 TIMES DAILY AS NEEDED 05/17/20 05/17/21  Nolene Ebbs, MD  topiramate (TOPAMAX) 100 MG tablet Take 1 tablet by mouth daily 09/26/20     topiramate (TOPAMAX) 25 MG tablet Take 25 mg by mouth daily. 04/01/20   [provider]  traZODone (DESYREL) 50 MG tablet Take 50 mg by mouth at bedtime as needed for sleep (sleep).     [provider]  Vitamin D, Ergocalciferol, (DRISDOL) 1.25 MG (50000 UNIT) CAPS capsule Take 50,000 Units by mouth once a week. 01/09/20   [provider]      Allergies    Latex and Penicillins    Review of Systems   Review of Systems  Gastrointestinal:  Positive for vomiting.  All other systems reviewed and are negative.  Physical Exam Updated Vital Signs BP 137/84 (BP Location: Right Arm)   Pulse 93   Temp 98 F (36.7 C) (Oral)   Resp 20   Ht _0  (1.753 m)   Wt 113.4 kg   LMP 08/29/2021   SpO2 98%   BMI 36.92 kg/m  Physical Exam Vitals and nursing  note reviewed.  51 year old female, resting comfortably and in no acute distress. Vital signs are normal. Oxygen saturation is 98%, which is normal. Head is normocephalic and atraumatic. PERRLA, EOMI. Oropharynx is clear. Neck is nontender and supple without adenopathy or JVD. Back is nontender and there is no CVA tenderness. Lungs are clear without rales, wheezes, or rhonchi. Chest is nontender. Heart has regular rate and rhythm without murmur. Abdomen is soft, flat, nontender without masses or hepatosplenomegaly and peristalsis is hypoactive. Extremities have no cyanosis or edema, full range of motion is present. Skin is warm and dry without rash.  Neurologic: Mental status is normal, cranial nerves are intact, there are no motor or sensory deficits.  ED Results / Procedures / Treatments   Labs (all labs ordered are listed, but only abnormal results are displayed) Labs Reviewed  COMPREHENSIVE METABOLIC PANEL - Abnormal; Notable for the following components:      Result Value   Chloride 114 (*)    CO2 19 (*)    Glucose, Bld 127 (*)    Calcium 8.7 (*)    All other components within normal limits  CBC - Abnormal; Notable for the following components:   WBC 15.5 (*)    Platelets 471 (*)    All other components within normal limits  LIPASE, BLOOD  URINALYSIS, ROUTINE W REFLEX MICROSCOPIC  PREGNANCY, URINE   Procedures Procedures    Medications Ordered in ED Medications  lactated ringers bolus 1,000 mL (has no administration in time range)  ondansetron (ZOFRAN) injection 4 mg (has no administration in time range)  ondansetron (ZOFRAN-ODT) disintegrating tablet 4 mg (4 mg Oral Given 09/01/21 2236)    ED Course/ Medical Decision Making/ A&P                           Medical Decision Making Amount and/or Complexity of Data Reviewed Labs: ordered.  Risk Prescription drug management.   Nausea and vomiting which is most likely viral gastritis.  Differential diagnosis  includes food poisoning, bowel obstruction, cholecystitis, pancreatitis.  I have ordered laboratory evaluation and have independently reviewed and interpreted the results.  There is a mild leukocytosis of 15.5 which is nonspecific, mild thrombocytosis which has been present previously and may be reactive.  Electrolytes show slightly low CO2 of 19, probably secondary to fluid electrolyte loss.  Vomiting.  Anion gap is normal.  Glucose is mildly elevated at 127 but BUN and creatinine are normal as are transaminases.  Urinalysis shows high specific gravity consistent with dehydration.  Patient states that she did have ondansetron at home, but was not able to hold a tablet.  She will be given IV fluids and intravenous ondansetron.  She feels much better after above-noted treatment.  She is discharged with prescription for ondansetron oral dissolving tablet.  Final Clinical Impression(s) / ED Diagnoses Final diagnoses:  Nausea and vomiting in adult    Rx / DC Orders ED Discharge Orders          Ordered    ondansetron (ZOFRAN-ODT) 8 MG disintegrating tablet  Every 8 hours PRN        09/02/21 2567              Delora Fuel, MD 20/91/98 [phone]

## 2021-09-23 ENCOUNTER — Emergency Department (HOSPITAL_BASED_OUTPATIENT_CLINIC_OR_DEPARTMENT_OTHER)
Admission: EM | Admit: 2021-09-23 | Discharge: 2021-09-23 | Disposition: A | Discharge status: Home / Self Care | Payer: Medicaid Other | Attending: Emergency Medicine | Admitting: Emergency Medicine

## 2021-09-23 ENCOUNTER — Other Ambulatory Visit: Payer: Self-pay

## 2021-09-23 ENCOUNTER — Encounter (HOSPITAL_BASED_OUTPATIENT_CLINIC_OR_DEPARTMENT_OTHER): Payer: Self-pay | Admitting: *Deleted

## 2021-09-23 DIAGNOSIS — Z79899 Other long term (current) drug therapy: Secondary | ICD-10-CM | POA: Diagnosis not present

## 2021-09-23 DIAGNOSIS — B372 Candidiasis of skin and nail: Secondary | ICD-10-CM | POA: Diagnosis not present

## 2021-09-23 DIAGNOSIS — J45909 Unspecified asthma, uncomplicated: Secondary | ICD-10-CM | POA: Diagnosis not present

## 2021-09-23 DIAGNOSIS — R109 Unspecified abdominal pain: Secondary | ICD-10-CM | POA: Insufficient documentation

## 2021-09-23 DIAGNOSIS — Z91040 Latex allergy status: Secondary | ICD-10-CM | POA: Insufficient documentation

## 2021-09-23 DIAGNOSIS — I10 Essential (primary) hypertension: Secondary | ICD-10-CM | POA: Insufficient documentation

## 2021-09-23 DIAGNOSIS — R21 Rash and other nonspecific skin eruption: Secondary | ICD-10-CM | POA: Diagnosis present

## 2021-09-23 MED ORDER — CLOTRIMAZOLE 1 % EX CREA: TOPICAL_CREAM | CUTANEOUS | 0 refills | Status: AC

## 2021-09-23 MED ORDER — DICYCLOMINE HCL 20 MG PO TABS: 20.0000 mg | ORAL_TABLET | Freq: Two times a day (BID) | ORAL | 0 refills | Status: AC

## 2021-09-23 MED ORDER — ONDANSETRON HCL 4 MG PO TABS: 4.0000 mg | ORAL_TABLET | Freq: Three times a day (TID) | ORAL | 0 refills | Status: AC | PRN

## 2021-09-23 NOTE — Discharge Instructions (Addendum)
Please return to ED with any new symptoms such as fevers Please follow up with PCP for further management Please pick up rx medications sent in. You will apply this medication two times daily for 2 weeks until symptoms resolve. Please read attached informational guide concerning candidal skin infections. I have also sent in bentyl for abdominal cramping and zofran for nausea.

## 2021-09-23 NOTE — ED Provider Notes (Signed)
[facility] EMERGENCY DEPARTMENT Provider Note   CSN: [id number] Arrival date & time: 09/23/21  1339     History  Chief Complaint  Patient presents with   Rash    Margaret Owens is a 51 y.o. female with medical history significant for arthritis, asthma, GERD, hypertension, sciatica, seizures.  Patient presents to ED for evaluation of skin irritation to umbilicus.  The patient reports that the last 2 days, she has a skin irritation, erythema and white discharge surrounding her bellybutton.  Patient reports that initially presented in question was very irritated and burned.  Patient reports that she attempted to clean the area in question using hydrogen peroxide however this seems to only irritate it further.  Patient also complaining of abdominal cramping, nausea.  Patient reports last menstrual period was on 5/15. Patient denies fevers, vomiting, diarrhea, dysuria. Patient denies new lotions, detergents, perfumes, soaps.    Rash Associated symptoms: abdominal pain and nausea   Associated symptoms: no diarrhea, no fever and not vomiting        Home Medications Prior to Admission medications   Medication Sig Start Date End Date Taking? Authorizing Provider  clotrimazole (LOTRIMIN) 1 % cream Apply to affected area 2 times daily 09/23/21  Yes Abdoulie Tierce, Lesle Reek, PA-C  dicyclomine (BENTYL) 20 MG tablet Take 1 tablet (20 mg total) by mouth 2 (two) times daily. 09/23/21  Yes Azucena Cecil, PA-C  ondansetron (ZOFRAN) 4 MG tablet Take 1 tablet (4 mg total) by mouth every 8 (eight) hours as needed for nausea or vomiting. 09/23/21  Yes Azucena Cecil, PA-C  albuterol (VENTOLIN HFA) 108 (90 Base) MCG/ACT inhaler Inhale 2 puffs into the lungs every 6 (six) hours as needed for wheezing (wheezing). 10/05/18   Fulp, Ander Gaster, MD  budesonide-formoterol (SYMBICORT) 160-4.5 MCG/ACT inhaler 2 puffs twice daily to control asthma 10/05/18   Fulp, Cammie, MD  buprenorphine (BUTRANS) 7.5  MCG/HR Place 1 patch onto the skin once a week. 11/28/20     cetirizine (ZYRTEC) 10 MG tablet Take 1 tablet (10 mg total) by mouth daily. 10/05/18   Fulp, Cammie, MD  Cholecalciferol (VITAMIN D3) 50 MCG (2000 UT) capsule SMARTSIG:By Mouth 04/11/20   [provider]  cyclobenzaprine (FLEXERIL) 10 MG tablet Take 10 mg by mouth 3 (three) times daily as needed. 03/05/21   [provider]  cyclobenzaprine (FLEXERIL) 5 MG tablet Take 1 tablet (5 mg total) by mouth 3 (three) times daily as needed for muscle spasms. 10/05/18   Fulp, Cammie, MD  desonide (DESOWEN) 0.05 % cream Apply topically 2 (two) times daily as needed. 07/15/20   Nolene Ebbs, MD  diclofenac sodium (VOLTAREN) 1 % GEL Apply 4 grams every 6 hours (or 4 times per day) to the knees if needed for pain 09/10/18   Drenda Freeze, MD  DULoxetine (CYMBALTA) 30 MG capsule Take 30 mg by mouth 2 (two) times daily.    [provider]  famotidine (PEPCID) 20 MG tablet Take 20 mg by mouth 2 (two) times daily. 03/10/21   [provider]  ferrous sulfate 324 (65 Fe) MG TBEC Take 1 tablet by mouth daily 09/26/20     fluticasone (FLONASE) 50 MCG/ACT nasal spray Place 2 sprays into both nostrils daily. 12/17/17   Fulp, Cammie, MD  furosemide (LASIX) 20 MG tablet Take 20 mg by mouth daily. 01/09/20   [provider]  furosemide (LASIX) 20 MG tablet TAKE 1 TABLET BY MOUTH EVERY DAY AS NEEDED FOR LEG  SWELLING 09/07/19 09/06/20  Nolene Ebbs, MD  gabapentin (NEURONTIN) 300 MG capsule Take 1 (one) Capsule by mouth four times daily, as needed 10/02/20     hydrochlorothiazide (HYDRODIURIL) 50 MG tablet Take 50 mg by mouth daily as needed. 05/13/21   [provider]  hydrocortisone 2.5 % cream SMARTSIG:Sparingly Topical 1-2 Times Daily 02/19/21   [provider]  hydrOXYzine (ATARAX/VISTARIL) 25 MG tablet Take 1 tablet (25 mg total) by mouth every 8 (eight) hours as needed. 01/03/21   Quintella Reichert, MD   LINZESS 290 MCG CAPS capsule Take 290 mcg by mouth daily. 08/03/19   [provider]  MAG-G 500 (27 Mg) MG TABS Take by mouth. 04/11/20   [provider]  meloxicam (MOBIC) 15 MG tablet Take 1 tablet (15 mg total) by mouth daily. As needed for pain. Take after eating 10/05/18   Fulp, Cammie, MD  metFORMIN (GLUCOPHAGE) 500 MG tablet TAKE 1 TABLET BY MOUTH ONCE DAILY IN [facility] 04/01/20 04/01/21  Simona Huh, NP  Na Sulfate-K Sulfate-Mg Sulf (SUPREP BOWEL PREP KIT) 17.5-3.13-1.6 GM/177ML SOLN Take 354 Milliliter as directed on prep sheet 10/09/20     naloxone (NARCAN) nasal spray 4 mg/0.1 mL Call 911, Place 1 spray into one nostril. Repeat every 3 minutes as needed if no or minimal response. 10/03/20     naproxen (NAPROSYN) 500 MG tablet Take 1 tablet (500 mg total) by mouth 2 (two) times daily. 08/15/19   Garald Balding, PA-C  NARCAN 4 MG/0.1ML LIQD nasal spray kit 1 spray once. 02/06/20   [provider]  ondansetron (ZOFRAN-ODT) 8 MG disintegrating tablet Take 0.5 tablets (4 mg total) by mouth every 8 (eight) hours as needed. 5/99/35   Delora Fuel, MD  oxyCODONE-acetaminophen (PERCOCET) 10-325 MG tablet Take 1 (one) Tablet by mouth four times daily, as needed 10/02/20     oxyCODONE-acetaminophen (PERCOCET) 10-325 MG tablet Take 1 tablet by mouth 4 (four) times daily as needed. 11/28/20     pantoprazole (PROTONIX) 40 MG tablet Take 1 tablet (40 mg total) by mouth 2 (two) times daily. 10/05/18   Fulp, Cammie, MD  phentermine 37.5 MG capsule Take 1 (one) Capsule by mouth daily 09/26/20     polyethylene glycol (MIRALAX) 17 g packet Take 17 g by mouth daily. 03/28/19   Fransico Meadow, PA-C  PROAIR HFA 108 [phone] Base) MCG/ACT inhaler INHALE 2 PUFFS INTO THE LUNGS 4 TIMES DAILY AS NEEDED 05/17/20 05/17/21  Nolene Ebbs, MD  topiramate (TOPAMAX) 100 MG tablet Take 1 tablet by mouth daily 09/26/20     topiramate (TOPAMAX) 25 MG tablet Take 25 mg by mouth daily. 04/01/20   [provider]  traZODone (DESYREL) 50 MG tablet Take 50 mg by mouth at bedtime as needed for sleep (sleep).     [provider]  Vitamin D, Ergocalciferol, (DRISDOL) 1.25 MG (50000 UNIT) CAPS capsule Take 50,000 Units by mouth once a week. 01/09/20   [provider]      Allergies    Latex and Penicillins    Review of Systems   Review of Systems  Constitutional:  Negative for chills and fever.  Gastrointestinal:  Positive for abdominal pain and nausea. Negative for diarrhea and vomiting.  Genitourinary:  Negative for dysuria.  Skin:  Positive for rash.  All other systems reviewed and are negative.   Physical Exam Updated Vital Signs BP 133/87 (BP Location: Left Arm)   Pulse 96   Temp 99.3 F (37.4 C) (Oral)  Resp 14   LMP 08/29/2021   SpO2 98%  Physical Exam Vitals and nursing note reviewed.  Constitutional:      General: She is not in acute distress.    Appearance: Normal appearance. She is not ill-appearing, toxic-appearing or diaphoretic.  HENT:     Head: Normocephalic and atraumatic.     Nose: Nose normal. No congestion.     Mouth/Throat:     Mouth: Mucous membranes are moist.     Pharynx: Oropharynx is clear.  Eyes:     Extraocular Movements: Extraocular movements intact.     Conjunctiva/sclera: Conjunctivae normal.     Pupils: Pupils are equal, round, and reactive to light.  Cardiovascular:     Rate and Rhythm: Normal rate and regular rhythm.  Pulmonary:     Effort: Pulmonary effort is normal.     Breath sounds: Normal breath sounds. No wheezing.  Abdominal:     General: Abdomen is flat. Bowel sounds are normal.     Palpations: Abdomen is soft.     Tenderness: There is no abdominal tenderness.  Musculoskeletal:     Cervical back: Normal range of motion and neck supple. No tenderness.  Skin:    General: Skin is warm and dry.     Capillary Refill: Capillary refill takes less than 2 seconds.     Comments: Patient has excessive skin  surrounding umbilicus which increases chance of infection. There is erythema, white discharge noted to patient umbilicus. No surrounding erythema outside of umbilicus.   Neurological:     Mental Status: She is alert and oriented to person, place, and time.     ED Results / Procedures / Treatments   Labs (all labs ordered are listed, but only abnormal results are displayed) Labs Reviewed - No data to display  EKG None  Radiology No results found.  Procedures Procedures   Medications Ordered in ED Medications - No data to display  ED Course/ Medical Decision Making/ A&P                           Medical Decision Making  51 year old female presents to the ED for evaluation.  Please see HPI for further details.  On examination, patient afebrile and nontoxic appearing. Patient umbilicus has appearance consistent with candidal infection. Patient will be placed on two weeks of topical antifungal treatment and advised to follow up with PCP for further treatment. Patient will also be given bentyl and zofran for abdominal cramping and nausea which I suspect is secondary to patient menstrual cycle as she states these are symptoms she often deals with during her cycle.  Patient given return precautions and voiced understanding. Patient had her questions answered to satisfaction. Patient stable at this time for discharge.    Final Clinical Impression(s) / ED Diagnoses Final diagnoses:  Candidal skin infection    Rx / DC Orders ED Discharge Orders          Ordered    clotrimazole (LOTRIMIN) 1 % cream        09/23/21 1630    dicyclomine (BENTYL) 20 MG tablet  2 times daily        09/23/21 1630    ondansetron (ZOFRAN) 4 MG tablet  Every 8 hours PRN        09/23/21 1630              Azucena Cecil, PA-C 09/23/21 [place], [place], DO 09/23/21 1703

## 2021-09-23 NOTE — ED Triage Notes (Signed)
Pt has a yeast infection in her belly button and surrounding skin folds.  This began 2 days ago, pt applied peroxide.

## 2021-12-02 NOTE — Progress Notes (Unsigned)
Office Visit Note  Patient: Margaret Owens             Date of Birth: 07-19-1970           MRN: [id number]             PCP: Ladell Pier, MD Referring: Calvert Cantor, MD Visit Date: 12/03/2021 Occupation: CNA  Subjective:  New Patient (Initial Visit) (Rashes, joint pain and swelling, crepitus in knees and elbows.)   History of Present Illness: Margaret Owens is a 51 y.o. female here for evaluation of possible scleroderma with positive ANA anticentromere antibodies and elevated sedimentation rate.  Last year around September she developed new symptoms with extensive rashes on her trunk and upper extremities and lesser extent on lower extremities without a clear provocation.  She has been following up with allergy clinic for management of this so far symptoms have partially improved but continues having active erythematous rashes popping up in different places.  These are itchy when present and often leaving behind a hyperpigmented flat patch of skin after erythema improves.  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  Valuation in allergy clinic did have findings for elevated complements also had positive ANA and highly positive anticentromere antibodies. Skin rashes she is had a good amount of joint pain bothering her.  Worst affected is her left knee and she experiences periodic swelling at the site.  She has had local injections for this which are fairly beneficial.  Her other worst affected area within the low back and has done physical therapy for sacroiliac joint pain.  Also has pain at the base of bilateral thumbs and some difficulty with her grip strength. She denies any Raynaud's symptoms.  She has some chronic skin changes from biting her cuticles but otherwise does not notice finger or nail changes.  She denies any new shortness of breath or productive cough or symptoms of acid reflux.  She does not notice  much fluid retention in her legs usually only see swelling when her left knee is flaring up.  04/2021 ANA pos Centromere >8.0 ESR 60  Activities of Daily Living:  Patient reports morning stiffness for 10-15 minutes.   Patient Reports nocturnal pain.  Difficulty dressing/grooming: Denies Difficulty climbing stairs: Reports Difficulty getting out of chair: Reports Difficulty using hands for taps, buttons, cutlery, and/or writing: Denies  Review of Systems  Constitutional:  Positive for fatigue.  HENT:  Positive for mouth dryness. Negative for mouth sores.   Eyes:  Positive for dryness.  Respiratory:  Negative for shortness of breath.   Cardiovascular:  Negative for chest pain and palpitations.  Gastrointestinal:  Positive for constipation. Negative for blood in stool and diarrhea.  Endocrine: Negative for increased urination.  Genitourinary:  Negative for involuntary urination.  Musculoskeletal:  Positive for joint pain, joint pain, joint swelling and morning stiffness. Negative for gait problem, myalgias, muscle weakness, muscle tenderness and myalgias.  Skin:  Positive for rash, hair loss and sensitivity to sunlight. Negative for color change.  Allergic/Immunologic: Negative for susceptible to infections.  Neurological:  Positive for headaches. Negative for dizziness.  Hematological:  Negative for swollen glands.  Psychiatric/Behavioral:  Negative for depressed mood and sleep disturbance. The patient is not nervous/anxious.     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 12/03/2021   Spinal stenosis of lumbar region with radiculopathy 01/23/2021   COVID-19 vaccination declined 05/16/2020   Prediabetes 05/16/2020   Obesity (BMI 35.0-39.9 without comorbidity) 05/16/2020   Primary osteoarthritis of left knee 07/04/2018    Uterine fibroid 06/09/2017   Endometrial stromal nodule 06/09/2017    Past Medical History:  Diagnosis Date   Anxiety    Arthritis    Asthma    Bursitis    Depression    GERD (gastroesophageal reflux disease)    Hypertension    Muscle spasms of both lower extremities    PTSD (post-traumatic stress disorder)    Sciatica    Seizures (HCC)    Uterine fibroid     Family History  Problem Relation Age of Onset   Osteoarthritis Mother    Breast cancer Maternal Grandmother        spread to lungs    Healthy Son    Healthy Son    Irregular heart beat Daughter    Asthma Daughter    Past Surgical History:  Procedure Laterality Date   colonoscopy     Social History   Social History Narrative   Not on file   Immunization History  Administered Date(s) Administered   Influenza,inj,Quad PF,6+ Mos 02/01/2020   Tdap 12/17/2017     Objective: Vital Signs: BP 111/76 (BP Location: Right Arm, Patient Position: Sitting, Cuff Size: Large)   Pulse 74   Resp 15   Ht '5\' 7"'  (1.702 m)   Wt 236 lb (107 kg)   LMP 11/15/2021   BMI 36.96 kg/m    Physical Exam Constitutional:      Appearance: She is obese.  HENT:     Nose: Nose normal.     Mouth/Throat:     Mouth: Mucous membranes are moist.     Pharynx: Oropharynx is clear.  Cardiovascular:     Rate and Rhythm: Normal rate and regular rhythm.  Pulmonary:     Effort: Pulmonary effort is normal.     Breath sounds: Normal breath sounds.  Musculoskeletal:     Right lower leg: No edema.     Left lower leg: No edema.  Lymphadenopathy:     Cervical: No cervical adenopathy.  Skin:    General: Skin is warm and dry.     Findings: Rash present.     Comments: Numerous flat less than 1 cm diameter hyperpigmented skin spots across her abdomen, less densely scattered on upper arms and there are at least 2 mildly erythematous, flat spots on the left forearm Normal-appearing nailfold capillaroscopy No nail changes or digital pitting There  are chronic appearing skin changes around the cuticle and nail base on fingers of both hands with some hangnails in various healing stages  Neurological:     Mental Status: She is alert.  Psychiatric:        Mood and Affect: Mood normal.      Musculoskeletal Exam:  Shoulders full ROM no tenderness or swelling Elbows full ROM no tenderness or swelling Wrists full ROM no tenderness or swelling Fingers full ROM there is squaring of the first [facility] joint bilaterally appears to be mild muscle atrophy Left greater than right knee crepitus, slightly decreased left knee flexion range of motion, no palpable effusion Ankles full ROM no tenderness or swelling   Investigation: No additional findings.  Imaging: No results found.  Recent Labs: Lab Results  Component Value Date   WBC 15.5 (H) 09/01/2021   HGB  13.4 09/01/2021   PLT 471 (H) 09/01/2021   NA 139 09/01/2021   K 3.7 09/01/2021   CL 114 (H) 09/01/2021   CO2 19 (L) 09/01/2021   GLUCOSE 127 (H) 09/01/2021   BUN 7 09/01/2021   CREATININE 0.63 09/01/2021   BILITOT 0.5 09/01/2021   ALKPHOS 71 09/01/2021   AST 17 09/01/2021   ALT 13 09/01/2021   PROT 8.1 09/01/2021   ALBUMIN 3.9 09/01/2021   CALCIUM 8.7 (L) 09/01/2021   GFRAA 118 05/17/2020    Speciality Comments: No specialty comments available.  Procedures:  No procedures performed Allergies: Latex and Penicillins   Assessment / Plan:     Visit Diagnoses: Positive ANA (antinuclear antibody) - Plan: Sedimentation rate, ANA, C3 and C4  Significant lab abnormalities but currently she does not describe nor other clinical exam findings consistent with systemic sclerosis.  Describe rashes possibly urticarial but not having typical features for urticarial vasculitis and complement levels were increased.  Planning to repeat the sedimentation rate and complement C3 and C4 we will also check ANA with IFA titer and pattern.  Urticaria  She is continue to follow-up with allergy  immunology clinic for symptom management right now reasonably mild but there are some active erythematous spots.  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 Primary osteoarthritis of left knee  Osteoarthritis of multiple areas she already has other providers addressing the problem, briefly discussed also provided some printed information on treatment options.  Orders: Orders Placed This Encounter  Procedures   Sedimentation rate   ANA   C3 and C4   No orders of the defined types were placed in this encounter.    Follow-Up Instructions: No follow-ups on file.   Collier Salina, MD  Note - This record has been created using [employer].  Chart creation errors have been sought, but may not always  have been located. Such creation errors do not reflect on  the standard of medical care.

## 2021-12-03 ENCOUNTER — Ambulatory Visit: Payer: Medicaid Other | Attending: Rheumatology | Admitting: Internal Medicine

## 2021-12-03 ENCOUNTER — Encounter: Payer: Self-pay | Admitting: Internal Medicine

## 2021-12-03 VITALS — BP 111/76 | HR 74 | Resp 15 | Ht 67.0 in | Wt 236.0 lb

## 2021-12-03 DIAGNOSIS — L509 Urticaria, unspecified: Secondary | ICD-10-CM | POA: Diagnosis not present

## 2021-12-03 DIAGNOSIS — M1712 Unilateral primary osteoarthritis, left knee: Secondary | ICD-10-CM | POA: Diagnosis not present

## 2021-12-03 DIAGNOSIS — R768 Other specified abnormal immunological findings in serum: Secondary | ICD-10-CM

## 2021-12-03 DIAGNOSIS — M180 Bilateral primary osteoarthritis of first carpometacarpal joints: Secondary | ICD-10-CM

## 2021-12-03 NOTE — Patient Instructions (Signed)
For osteoarthritis several treatments may be beneficial:  - Topical antiinflammatory medicine such as diclofenac or Voltaren can be applied to  affected area as needed but may be less effective than oral antiinflammatory medicine. Topical analgesics containing CBD, menthol, or lidocaine can be tried.  - Oral nonsteroidal antiinflammatory drugs (NSAIDs) such as ibuprofen, aleve, celebrex, or mobic are very helpful for osteoarthritis but can cause side effects such as stomach ulcers or hypertension with prolonged use. These should be taken intermittently or as needed, and always taken with food.  - Turmeric has some antiinflammatory effect similar to NSAIDs and may help, if taken as a supplement should not be taken above recommended doses. Other oral supplements such as glucosamine chondroitin containing OTC treatments do not have as strong data supporting effectiveness but can be helpful for some individuals and have no major side effects.   - Compressive gloves can be helpful to support the fingers and thumb joint especially if hurting with certain activities or if swelling overnight.  - Occupational therapy referral can discuss exercises or activity modification to improve symptoms or strength if needed.  - Local steroid injection is an option if symptoms become worse and not controlled by the above options.

## 2021-12-07 LAB — SEDIMENTATION RATE: Sed Rate: 39 mm/h — ABNORMAL HIGH (ref 0–30)

## 2021-12-07 LAB — ANTI-NUCLEAR AB-TITER (ANA TITER)
ANA TITER: 1:80 {titer} — ABNORMAL HIGH
ANA Titer 1: 1:1280 {titer} — ABNORMAL HIGH

## 2021-12-07 LAB — C3 AND C4
C3 Complement: 196 mg/dL — ABNORMAL HIGH (ref 83–193)
C4 Complement: 59 mg/dL — ABNORMAL HIGH (ref 15–57)

## 2021-12-07 LAB — ANA: Anti Nuclear Antibody (ANA): POSITIVE — AB

## 2021-12-08 NOTE — Progress Notes (Signed)
Lab results are all positive for continued inflammation although still not specific for the exact cause. I recommend we need to schedule follow up appointment since there may be some treatment options we can discuss if she is still having the rashes and joint pain symptoms.

## 2021-12-23 ENCOUNTER — Ambulatory Visit: Payer: Medicaid Other | Admitting: Internal Medicine

## 2021-12-29 ENCOUNTER — Ambulatory Visit: Payer: Medicaid Other | Admitting: Rheumatology

## 2022-01-20 ENCOUNTER — Ambulatory Visit: Payer: Medicaid Other | Admitting: Rheumatology

## 2022-03-13 ENCOUNTER — Other Ambulatory Visit: Payer: Self-pay

## 2022-03-13 ENCOUNTER — Encounter (HOSPITAL_BASED_OUTPATIENT_CLINIC_OR_DEPARTMENT_OTHER): Payer: Self-pay | Admitting: Emergency Medicine

## 2022-03-13 ENCOUNTER — Emergency Department (HOSPITAL_BASED_OUTPATIENT_CLINIC_OR_DEPARTMENT_OTHER)
Admission: EM | Admit: 2022-03-13 | Discharge: 2022-03-13 | Disposition: A | Discharge status: Home / Self Care | Payer: Medicaid Other | Attending: Emergency Medicine | Admitting: Emergency Medicine

## 2022-03-13 DIAGNOSIS — K0889 Other specified disorders of teeth and supporting structures: Secondary | ICD-10-CM | POA: Diagnosis present

## 2022-03-13 DIAGNOSIS — Z79899 Other long term (current) drug therapy: Secondary | ICD-10-CM | POA: Diagnosis not present

## 2022-03-13 DIAGNOSIS — I10 Essential (primary) hypertension: Secondary | ICD-10-CM | POA: Diagnosis not present

## 2022-03-13 DIAGNOSIS — J45909 Unspecified asthma, uncomplicated: Secondary | ICD-10-CM | POA: Insufficient documentation

## 2022-03-13 DIAGNOSIS — L03211 Cellulitis of face: Secondary | ICD-10-CM

## 2022-03-13 MED ORDER — CLINDAMYCIN HCL 150 MG PO CAPS: 450.0000 mg | ORAL_CAPSULE | Freq: Three times a day (TID) | ORAL | 0 refills | Status: AC

## 2022-03-13 MED ORDER — CLINDAMYCIN HCL 150 MG PO CAPS
300.0000 mg | ORAL_CAPSULE | Freq: Once | ORAL | Status: AC
  Administered 2022-03-13: 300 mg via ORAL
  Filled 2022-03-13: qty 2

## 2022-03-13 MED ORDER — IBUPROFEN 800 MG PO TABS: 800.0000 mg | ORAL_TABLET | Freq: Three times a day (TID) | ORAL | 0 refills | Status: AC

## 2022-03-13 MED ORDER — IBUPROFEN 800 MG PO TABS
800.0000 mg | ORAL_TABLET | Freq: Once | ORAL | Status: AC
  Administered 2022-03-13: 800 mg via ORAL
  Filled 2022-03-13: qty 1

## 2022-03-13 NOTE — ED Provider Notes (Signed)
[facility] EMERGENCY DEPARTMENT Provider Note   CSN: [id number] Arrival date & time: 03/13/22  1723     History  Chief Complaint  Patient presents with   Dental Pain    Lilibeth E Randle is a 51 y.o. female.  With a history of hypertension, anxiety, depression, PTSD, GERD, asthma, seizures who presents ED for evaluation of dental pain.  She states that this pain began insidiously 3 days ago.  Pain is localized to the left maxilla with radiation to the left nostril and mandible.  Pain is constant and she describes it as a throbbing.  Rates it at a 10 out of 10.  Has been taking Tylenol with mild relief.  Denies fevers, chills, trismus, hoarse voice, difficulty handling oral secretions, sore throat, neck pain.  States she called her dentist but could not be seen until February.   Dental Pain      Home Medications Prior to Admission medications   Medication Sig Start Date End Date Taking? Authorizing Provider  albuterol (VENTOLIN HFA) 108 (90 Base) MCG/ACT inhaler Inhale 2 puffs into the lungs every 6 (six) hours as needed for wheezing (wheezing). 10/05/18   Fulp, Ander Gaster, MD  budesonide-formoterol (SYMBICORT) 160-4.5 MCG/ACT inhaler 2 puffs twice daily to control asthma 10/05/18   Fulp, Cammie, MD  buprenorphine (BUTRANS) 7.5 MCG/HR Place 1 patch onto the skin once a week. 11/28/20     cetirizine (ZYRTEC) 10 MG tablet Take 1 tablet (10 mg total) by mouth daily. 10/05/18   Fulp, Cammie, MD  Cholecalciferol (VITAMIN D3) 50 MCG (2000 UT) capsule SMARTSIG:By Mouth 04/11/20   [provider]  clobetasol cream (TEMOVATE) 0.05 % as needed. 10/01/21   [provider]  clotrimazole (LOTRIMIN) 1 % cream Apply to affected area 2 times daily 09/23/21   Azucena Cecil, PA-C  cyclobenzaprine (FLEXERIL) 10 MG tablet Take 10 mg by mouth 3 (three) times daily as needed. 03/05/21   [provider]  cyclobenzaprine (FLEXERIL) 5 MG tablet Take 1 tablet (5 mg total) by  mouth 3 (three) times daily as needed for muscle spasms. 10/05/18   Fulp, Cammie, MD  desonide (DESOWEN) 0.05 % cream Apply topically 2 (two) times daily as needed. 07/15/20   Nolene Ebbs, MD  diclofenac sodium (VOLTAREN) 1 % GEL Apply 4 grams every 6 hours (or 4 times per day) to the knees if needed for pain 09/10/18   Drenda Freeze, MD  dicyclomine (BENTYL) 20 MG tablet Take 1 tablet (20 mg total) by mouth 2 (two) times daily. 09/23/21   Azucena Cecil, PA-C  DULoxetine (CYMBALTA) 30 MG capsule Take 30 mg by mouth 2 (two) times daily.    [provider]  famotidine (PEPCID) 20 MG tablet Take 20 mg by mouth 2 (two) times daily. 03/10/21   [provider]  ferrous sulfate 324 (65 Fe) MG TBEC Take 1 tablet by mouth daily 09/26/20     fluticasone (FLONASE) 50 MCG/ACT nasal spray Place 2 sprays into both nostrils daily. 12/17/17   Fulp, Cammie, MD  furosemide (LASIX) 20 MG tablet Take 20 mg by mouth daily. 01/09/20   [provider]  furosemide (LASIX) 20 MG tablet TAKE 1 TABLET BY MOUTH EVERY DAY AS NEEDED FOR LEG SWELLING 09/07/19 09/06/20  Nolene Ebbs, MD  gabapentin (NEURONTIN) 300 MG capsule Take 1 (one) Capsule by mouth four times daily, as needed 10/02/20     hydrochlorothiazide (HYDRODIURIL) 50 MG tablet Take 50 mg by mouth daily as needed.  05/13/21   [provider]  hydrocortisone 2.5 % cream SMARTSIG:Sparingly Topical 1-2 Times Daily 02/19/21   [provider]  hydroquinone 4 % cream Apply 1 g topically as needed. 09/09/21   [provider]  hydrOXYzine (ATARAX/VISTARIL) 25 MG tablet Take 1 tablet (25 mg total) by mouth every 8 (eight) hours as needed. 01/03/21   Quintella Reichert, MD  LINZESS 290 MCG CAPS capsule Take 290 mcg by mouth daily. 08/03/19   [provider]  MAG-G 500 (27 Mg) MG TABS Take by mouth. 04/11/20   [provider]  meloxicam (MOBIC) 15 MG tablet Take 1 tablet (15 mg total) by mouth daily. As needed  for pain. Take after eating 10/05/18   Fulp, Cammie, MD  metFORMIN (GLUCOPHAGE) 500 MG tablet TAKE 1 TABLET BY MOUTH ONCE DAILY IN [facility] 04/01/20 04/01/21  Simona Huh, NP  montelukast (SINGULAIR) 10 MG tablet daily. 10/09/21   [provider]  Na Sulfate-K Sulfate-Mg Sulf (SUPREP BOWEL PREP KIT) 17.5-3.13-1.6 GM/177ML SOLN Take 354 Milliliter as directed on prep sheet Patient not taking: Reported on 12/03/2021 10/09/20     naloxone (NARCAN) nasal spray 4 mg/0.1 mL Call 911, Place 1 spray into one nostril. Repeat every 3 minutes as needed if no or minimal response. 10/03/20     naproxen (NAPROSYN) 500 MG tablet Take 1 tablet (500 mg total) by mouth 2 (two) times daily. Patient not taking: Reported on 12/03/2021 08/15/19   Garald Balding, PA-C  NARCAN 4 MG/0.1ML LIQD nasal spray kit 1 spray once. 02/06/20   [provider]  Olopatadine HCl 0.2 % SOLN SMARTSIG:1-2 Drop(s) In Eye(s) Daily PRN 08/19/21   [provider]  ondansetron (ZOFRAN) 4 MG tablet Take 1 tablet (4 mg total) by mouth every 8 (eight) hours as needed for nausea or vomiting. 09/23/21   Azucena Cecil, PA-C  ondansetron (ZOFRAN-ODT) 8 MG disintegrating tablet Take 0.5 tablets (4 mg total) by mouth every 8 (eight) hours as needed. Patient not taking: Reported on 0/35/0093 11/29/27   Delora Fuel, MD  oxyCODONE-acetaminophen (PERCOCET) 10-325 MG tablet Take 1 (one) Tablet by mouth four times daily, as needed 10/02/20     oxyCODONE-acetaminophen (PERCOCET) 10-325 MG tablet Take 1 tablet by mouth 4 (four) times daily as needed. 11/28/20     OZEMPIC, 0.25 OR 0.5 MG/DOSE, 2 MG/3ML SOPN Inject into the skin once a week. 11/18/21   [provider]  pantoprazole (PROTONIX) 40 MG tablet Take 1 tablet (40 mg total) by mouth 2 (two) times daily. 10/05/18   Fulp, Cammie, MD  phentermine 37.5 MG capsule Take 1 (one) Capsule by mouth daily Patient not taking: Reported on 12/03/2021 09/26/20     polyethylene glycol  (MIRALAX) 17 g packet Take 17 g by mouth daily. 03/28/19   Fransico Meadow, PA-C  PROAIR HFA 108 [phone] Base) MCG/ACT inhaler INHALE 2 PUFFS INTO THE LUNGS 4 TIMES DAILY AS NEEDED 05/17/20 05/17/21  Nolene Ebbs, MD  topiramate (TOPAMAX) 100 MG tablet Take 1 tablet by mouth daily 09/26/20     topiramate (TOPAMAX) 25 MG tablet Take 25 mg by mouth daily. Patient not taking: Reported on 12/03/2021 04/01/20   [provider]  traZODone (DESYREL) 50 MG tablet Take 50 mg by mouth at bedtime as needed for sleep (sleep).     [provider]  Vitamin D, Ergocalciferol, (DRISDOL) 1.25 MG (50000 UNIT) CAPS capsule Take 50,000 Units by mouth once a week. 01/09/20   [provider]  Allergies    Latex and Penicillins    Review of Systems   Review of Systems  HENT:  Positive for dental problem.   All other systems reviewed and are negative.   Physical Exam Updated Vital Signs BP (!) 165/85   Pulse 91   Temp 98.2 F (36.8 C) (Oral)   Resp 20   Wt 106.6 kg   SpO2 99%   BMI 36.81 kg/m  Physical Exam Vitals and nursing note reviewed.  Constitutional:      General: She is not in acute distress.    Appearance: Normal appearance. She is normal weight. She is not ill-appearing.  HENT:     Head: Normocephalic and atraumatic.      Comments: Palate is soft.  Uvula midline.  Tonsils without enlargement and symmetric.  No obvious retropharyngeal abscess.  Patient with multiple missing teeth bilaterally in the lower row near the back    Mouth/Throat:      Comments: Missing teeth.  There is no intraoral swelling, drainage, obvious dental abscess Pulmonary:     Effort: Pulmonary effort is normal. No respiratory distress.  Abdominal:     General: Abdomen is flat.  Musculoskeletal:        General: Normal range of motion.     Cervical back: Neck supple.  Skin:    General: Skin is warm and dry.     Capillary Refill: Capillary refill takes less than 2 seconds.     Comments:  There is an area of induration approximately 1 cm x 1 cm in the left upper lip adjacent to the philtrum  Neurological:     General: No focal deficit present.     Mental Status: She is alert and oriented to person, place, and time.  Psychiatric:        Mood and Affect: Mood normal.        Behavior: Behavior normal.     ED Results / Procedures / Treatments   Labs (all labs ordered are listed, but only abnormal results are displayed) Labs Reviewed - No data to display  EKG None  Radiology No results found.  Procedures Procedures    Medications Ordered in ED Medications - No data to display  ED Course/ Medical Decision Making/ A&P                           Medical Decision Making This patient presents to the ED for concern of dental pain, this involves an extensive number of treatment options, and is a complaint that carries with it a high risk of complications and morbidity.  The differential diagnosis includes dental abscess including periapical or periodontal, cellulitis,, Ludwick's angina   Co morbidities that complicate the patient evaluation  hypertension, anxiety, depression, PTSD, GERD, asthma, seizures  My initial workup includes initial dose of clindamycin and Motrin  Additional history obtained from: Nursing notes from this visit.  Afebrile, slightly hypertensive but otherwise hemodynamically stable.  51 year old female presenting to the ED for evaluation of dental pain which appears to be more cellulitis in nature of the upper lip.  There is a 1 cm x 1 cm area of induration without erythema or fluctuance of the left upper lip adjacent to the philtrum.  This does not extend into the nasal cavity.  Patient does have multiple missing teeth that appear to be surgically removed.  The oral mucosa is nonerythematous and nonedematous.  There is no purulent drainage.  Palate is soft.  There is no tenderness to palpation of the neck.  There are no exudates in the posterior  pharynx.  There is no trismus.  The remainder of the physical exam was unremarkable.  Patient has been taking solely Tylenol for her pain.  She was given Motrin and clindamycin in the ED.  Will be sent a prescription for both of these.  She was strongly encouraged to follow-up with her primary care provider in 1 week if she cannot get into see a dentist.  Gave return precautions.  Stable at discharge.  At this time there does not appear to be any evidence of an acute emergency medical condition and the patient appears stable for discharge with appropriate outpatient follow up. Diagnosis was discussed with patient who verbalizes understanding of care plan and is agreeable to discharge. I have discussed return precautions with patient who verbalizes understanding. Patient encouraged to follow-up with their PCP within 1 week. All questions answered.  Patient's case discussed with Dr. Laverta Baltimore who agrees with plan to discharge with follow-up.   Note: Portions of this report may have been transcribed using voice recognition software. Every effort was made to ensure accuracy; however, inadvertent computerized transcription errors may still be present.          Final Clinical Impression(s) / ED Diagnoses Final diagnoses:  None    Rx / DC Orders ED Discharge Orders     None         Nehemiah Massed 03/13/22 Standley Dakins, MD 03/19/22 1501

## 2022-03-13 NOTE — Discharge Instructions (Addendum)
You have been seen today for your complaint of facial pain worsening over the past. Your discharge medications include clindamycin.  This is an antibiotic.  You should take it as prescribed and for the entire duration of the prescription is.  This may cause abdominal pain, this is normal.  You may take it with food. Tylenol and ibuprofen.  You may alternate these every 4 hours.  You may take 800 mg of ibuprofen and up to 1000 mg of Tylenol at a time. Follow up with: Your primary care provider in 1 week for reevaluation Please seek immediate medical care if you develop any of the following symptoms: Your symptoms get worse. You feel very sleepy. You develop vomiting or diarrhea that persists. You notice red streaks coming from the infected area. Your red area gets larger or turns dark in color. At this time there does not appear to be the presence of an emergent medical condition, however there is always the potential for conditions to change. Please read and follow the below instructions.  Do not take your medicine if  develop an itchy rash, swelling in your mouth or lips, or difficulty breathing; call 911 and seek immediate emergency medical attention if this occurs.  You may review your lab tests and imaging results in their entirety on your MyChart account.  Please discuss all results of fully with your primary care provider and other specialist at your follow-up visit.  Note: Portions of this text may have been transcribed using voice recognition software. Every effort was made to ensure accuracy; however, inadvertent computerized transcription errors may still be present.

## 2022-03-13 NOTE — ED Notes (Signed)
Patient states she took tylenol today for pain but it has not helped.she has pain in the left upper tooth that radiates into the jaw area.  She feels like her face, lips and nose are swollen on the same side

## 2022-03-13 NOTE — ED Triage Notes (Signed)
Left upper tooth pain x 3 days , no swelling . Pain radiating to nose  .

## 2022-04-12 NOTE — Congregational Nurse Program (Signed)
03/27/22 Member seen at [facility] today. Attending the graduation celebration.  Dressed in Administrator, arts.  Reports doing well.  Daughter in school and doing well.  Award given.  Vinnie Langton, RN, [place] Nurse, [phone]
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
## 2022-10-03 LAB — AMB RESULTS CONSOLE CBG: Glucose: 107

## 2022-10-11 NOTE — Congregational Nurse Program (Signed)
Member seen at [facility].  Attended a first aid workshop and took home a first aid kit.  Participated in workshop discussion.  Discussed need for alternate Mental health counseling.  Will followup.  Juliann Pulse, RN  Congregational Nursing  [phone]

## 2022-10-29 ENCOUNTER — Encounter: Payer: Self-pay | Admitting: *Deleted

## 2022-10-29 NOTE — Progress Notes (Signed)
Pt seen at the 10/03/22 screening event where her b/p was 121/80 and her blood sugar was 107.At the event, the pt did not identify and SDOH insecurities and the event form noted Dr. Jonah Blue from the [employer] and [facility] as her PCP. However, chart review does not indicate any appt with Dr. Laural Benes for over a year and does demonstrate pt has had ongoing contact with Congregational Nurse Juliann Pulse, RN over the past 1-2 months. In-basket message sent to nurse Neva Seat to collaborate with pt f/u and calls made to pt to verify PCP status and any healthcare access needs. During event f/u call with pt, pt shared that she is now seeing Dr. Fleet Contras at [facility] and she verbalized need to f/u with him, share her event results and "get a check up, get my mammogram." Pt also talked about f/u with her [place] pain control dr and her ortho specialist, because she is trying to avoid knee surgery. Pt discussed concerns about personal relationship causing uncertainty about housing and stated she was going to follow up with her "Section 8" housing contact and [employer] of the [place]. No additional health equity team support indicated at this time.

## 2022-11-07 LAB — AMB RESULTS CONSOLE CBG: Glucose: 100

## 2022-11-23 ENCOUNTER — Telehealth: Payer: Self-pay

## 2022-11-23 NOTE — Telephone Encounter (Signed)
F/u call to have her make appt with her pcp. Left message.  Juliann Pulse, RN  Congregational Nursing  [phone]

## 2022-12-05 ENCOUNTER — Telehealth (HOSPITAL_COMMUNITY): Payer: Self-pay

## 2022-12-05 LAB — AMB RESULTS CONSOLE CBG: Glucose: 116
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
## 2022-12-05 NOTE — Telephone Encounter (Signed)
Patient was outreached at [facility] Catch 5-in-5 event. Discussed the following with the patient:  Educated patient on the risks of uncontrolled diabetes Counseled patient on medication administration, specifically: Ozempic once weekly. Patient admitted to injecting medication in same site in the abdomen. She was then counseled  to rotate sites of injection to avoid medication buildup.  Counseled patient on lifestyle interventions to improve blood pressure including:  - adhering to a heart healthy diet - primarily vegetables and fruits  - increasing physical activity to target 150 minutes of moderate intensity exercise per week  Patient was provided with handouts regarding nonpharmacological ways to improve blood pressure and given a blood pressure/sugar log  Patient demonstrated understanding - teachback method was used to determine understanding of medication regimen and purpose  Verdene Rio, PharmD PGY1 Pharmacy Resident   .

## 2022-12-17 ENCOUNTER — Encounter: Payer: Self-pay | Admitting: *Deleted

## 2022-12-17 NOTE — Progress Notes (Signed)
Pt attended screening event on 12/05/22 where her b/p was 93/74 and her blood sugar was 116. At the event the pt told the event staff her PCP was Dr. Concepcion Elk and indicated she had SDOH insecurities related to transportation, food, and safety, for which she was given resources at the event. Chart review indicates pt also spoke to an [facility] at the event for counseling r/t her Ozempic and lifestyle choices, including diet. Chart notes on 11/23/22 also reflect a reminder by Juliann Pulse, Congregational Nursing RN, to pt to make an appt with Dr. Concepcion Elk.Pt also attended health equity screening event in June, so this is her second f/u. Unable to contact pt by phone, so health equity team member sent in-basket message to CNP Juliann Pulse, who also sees pt in the community. PCP office called and appt desk confirmed last time pt seen at their office was 05/2020. Letter mailed to pt reminding her to f/u with Dr. Tyson Dense and offering her [facility] and [facility] clinic options if she is looking for another PCP. Additional SDOH resources sent with letter for food and transportation (info also shared with CNP Neva Seat.)

## 2022-12-22 ENCOUNTER — Telehealth: Payer: Self-pay

## 2022-12-22 NOTE — Telephone Encounter (Signed)
Call to [facility].  Discussed her recent visit to a 5in5 event.  Want to get her established with a primary care.  She has been several places.  [facility], [facility] and [facility].  Discussed need to pick one and make an appt to establish as pcp.  She is to decide on which to pick and let me know.  Also has need to establish with a behavioral health provider.  She has been to [facility], but wants to pick another provider.  I will followup with her to encourage establishing with pcp and behavioral health.  Juliann Pulse, RN  Congregational Nursing  [phone]

## 2023-02-21 ENCOUNTER — Emergency Department (HOSPITAL_BASED_OUTPATIENT_CLINIC_OR_DEPARTMENT_OTHER)
Admission: EM | Admit: 2023-02-21 | Discharge: 2023-02-21 | Disposition: A | Discharge status: Home / Self Care | Payer: Medicaid Other | Attending: Emergency Medicine | Admitting: Emergency Medicine

## 2023-02-21 ENCOUNTER — Encounter (HOSPITAL_BASED_OUTPATIENT_CLINIC_OR_DEPARTMENT_OTHER): Payer: Self-pay | Admitting: Emergency Medicine

## 2023-02-21 ENCOUNTER — Emergency Department (HOSPITAL_BASED_OUTPATIENT_CLINIC_OR_DEPARTMENT_OTHER): Payer: Medicaid Other

## 2023-02-21 DIAGNOSIS — M25562 Pain in left knee: Secondary | ICD-10-CM | POA: Diagnosis present

## 2023-02-21 DIAGNOSIS — Z79899 Other long term (current) drug therapy: Secondary | ICD-10-CM | POA: Insufficient documentation

## 2023-02-21 DIAGNOSIS — Z91040 Latex allergy status: Secondary | ICD-10-CM | POA: Diagnosis not present

## 2023-02-21 DIAGNOSIS — G8929 Other chronic pain: Secondary | ICD-10-CM | POA: Insufficient documentation

## 2023-02-21 MED ORDER — KETOROLAC TROMETHAMINE 15 MG/ML IJ SOLN
15.0000 mg | Freq: Once | INTRAMUSCULAR | Status: AC
  Administered 2023-02-21: 15 mg via INTRAMUSCULAR
  Filled 2023-02-21: qty 1

## 2023-02-21 NOTE — Discharge Instructions (Addendum)
Please continue taking prescribed pain medication at home.  Please follow-up with orthopedic doctor or PCP for further management and care.

## 2023-02-21 NOTE — ED Provider Notes (Signed)
[facility] Provider Note   CSN: [id number] Arrival date & time: 02/21/23  1707     History  Chief Complaint  Patient presents with   Knee Pain    Margaret Owens is a 52 y.o. female with medical history of chronic left knee pain, chronic pain seen by [facility].  She reports to ED for evaluation of left knee pain.  States it is her chronic left knee pain.  States that she has been dealing with a flareup of this chronic left knee pain for the last 2 days.  States that she has tried taking her oxycodone at home without relief, states that the 10 mg tablets make her nauseous.  Denies any trauma to the knee.  Denies any fevers, redness, swelling to the knee.  Reports she feels as if "something slipped" and is requesting x-ray.   Knee Pain      Home Medications Prior to Admission medications   Medication Sig Start Date End Date Taking? Authorizing Provider  albuterol (VENTOLIN HFA) 108 (90 Base) MCG/ACT inhaler Inhale 2 puffs into the lungs every 6 (six) hours as needed for wheezing (wheezing). 10/05/18   Fulp, Hewitt Shorts, MD  budesonide-formoterol (SYMBICORT) 160-4.5 MCG/ACT inhaler 2 puffs twice daily to control asthma 10/05/18   Fulp, Cammie, MD  buprenorphine (BUTRANS) 7.5 MCG/HR Place 1 patch onto the skin once a week. 11/28/20     cetirizine (ZYRTEC) 10 MG tablet Take 1 tablet (10 mg total) by mouth daily. 10/05/18   Fulp, Cammie, MD  Cholecalciferol (VITAMIN D3) 50 MCG (2000 UT) capsule SMARTSIG:By Mouth 04/11/20   [provider]  clobetasol cream (TEMOVATE) 0.05 % as needed. 10/01/21   [provider]  clotrimazole (LOTRIMIN) 1 % cream Apply to affected area 2 times daily 09/23/21   Al Decant, PA-C  cyclobenzaprine (FLEXERIL) 10 MG tablet Take 10 mg by mouth 3 (three) times daily as needed. 03/05/21   [provider]  cyclobenzaprine (FLEXERIL) 5 MG tablet Take 1 tablet (5 mg total) by mouth  3 (three) times daily as needed for muscle spasms. 10/05/18   Fulp, Cammie, MD  desonide (DESOWEN) 0.05 % cream Apply topically 2 (two) times daily as needed. 07/15/20   Fleet Contras, MD  diclofenac sodium (VOLTAREN) 1 % GEL Apply 4 grams every 6 hours (or 4 times per day) to the knees if needed for pain 09/10/18   Charlynne Pander, MD  dicyclomine (BENTYL) 20 MG tablet Take 1 tablet (20 mg total) by mouth 2 (two) times daily. 09/23/21   Al Decant, PA-C  DULoxetine (CYMBALTA) 30 MG capsule Take 30 mg by mouth 2 (two) times daily.    [provider]  famotidine (PEPCID) 20 MG tablet Take 20 mg by mouth 2 (two) times daily. 03/10/21   [provider]  ferrous sulfate 324 (65 Fe) MG TBEC Take 1 tablet by mouth daily 09/26/20     fluticasone (FLONASE) 50 MCG/ACT nasal spray Place 2 sprays into both nostrils daily. 12/17/17   Fulp, Cammie, MD  furosemide (LASIX) 20 MG tablet Take 20 mg by mouth daily. 01/09/20   [provider]  furosemide (LASIX) 20 MG tablet TAKE 1 TABLET BY MOUTH EVERY DAY AS NEEDED FOR LEG SWELLING 09/07/19 09/06/20  Fleet Contras, MD  gabapentin (NEURONTIN) 300 MG capsule Take 1 (one) Capsule by mouth four times daily, as needed 10/02/20     hydrochlorothiazide (HYDRODIURIL) 50 MG tablet Take 50  mg by mouth daily as needed. 05/13/21   [provider]  hydrocortisone 2.5 % cream SMARTSIG:Sparingly Topical 1-2 Times Daily 02/19/21   [provider]  hydroquinone 4 % cream Apply 1 g topically as needed. 09/09/21   [provider]  hydrOXYzine (ATARAX/VISTARIL) 25 MG tablet Take 1 tablet (25 mg total) by mouth every 8 (eight) hours as needed. 01/03/21   Tilden Fossa, MD  ibuprofen (ADVIL) 800 MG tablet Take 1 tablet (800 mg total) by mouth 3 (three) times daily. 03/13/22   Schutt, Edsel Petrin, PA-C  LINZESS 290 MCG CAPS capsule Take 290 mcg by mouth daily. 08/03/19   [provider]  MAG-G 500 (27 Mg) MG TABS Take by mouth.  04/11/20   [provider]  meloxicam (MOBIC) 15 MG tablet Take 1 tablet (15 mg total) by mouth daily. As needed for pain. Take after eating 10/05/18   Fulp, Cammie, MD  metFORMIN (GLUCOPHAGE) 500 MG tablet TAKE 1 TABLET BY MOUTH ONCE DAILY IN [facility] 04/01/20 04/01/21  Courtney Paris, NP  montelukast (SINGULAIR) 10 MG tablet daily. 10/09/21   [provider]  Na Sulfate-K Sulfate-Mg Sulf (SUPREP BOWEL PREP KIT) 17.5-3.13-1.6 GM/177ML SOLN Take 354 Milliliter as directed on prep sheet Patient not taking: Reported on 12/03/2021 10/09/20     naloxone (NARCAN) nasal spray 4 mg/0.1 mL Call 911, Place 1 spray into one nostril. Repeat every 3 minutes as needed if no or minimal response. 10/03/20     naproxen (NAPROSYN) 500 MG tablet Take 1 tablet (500 mg total) by mouth 2 (two) times daily. Patient not taking: Reported on 12/03/2021 08/15/19   Mare Ferrari, PA-C  NARCAN 4 MG/0.1ML LIQD nasal spray kit 1 spray once. 02/06/20   [provider]  Olopatadine HCl 0.2 % SOLN SMARTSIG:1-2 Drop(s) In Eye(s) Daily PRN 08/19/21   [provider]  ondansetron (ZOFRAN) 4 MG tablet Take 1 tablet (4 mg total) by mouth every 8 (eight) hours as needed for nausea or vomiting. 09/23/21   Al Decant, PA-C  ondansetron (ZOFRAN-ODT) 8 MG disintegrating tablet Take 0.5 tablets (4 mg total) by mouth every 8 (eight) hours as needed. Patient not taking: Reported on 12/03/2021 09/02/21   Dione Booze, MD  oxyCODONE-acetaminophen (PERCOCET) 10-325 MG tablet Take 1 (one) Tablet by mouth four times daily, as needed 10/02/20     oxyCODONE-acetaminophen (PERCOCET) 10-325 MG tablet Take 1 tablet by mouth 4 (four) times daily as needed. 11/28/20     OZEMPIC, 0.25 OR 0.5 MG/DOSE, 2 MG/3ML SOPN Inject into the skin once a week. 11/18/21   [provider]  pantoprazole (PROTONIX) 40 MG tablet Take 1 tablet (40 mg total) by mouth 2 (two) times daily. 10/05/18   Fulp, Cammie, MD  phentermine 37.5 MG  capsule Take 1 (one) Capsule by mouth daily Patient not taking: Reported on 12/03/2021 09/26/20     polyethylene glycol (MIRALAX) 17 g packet Take 17 g by mouth daily. 03/28/19   Elson Areas, PA-C  PROAIR HFA 108 [phone] Base) MCG/ACT inhaler INHALE 2 PUFFS INTO THE LUNGS 4 TIMES DAILY AS NEEDED 05/17/20 05/17/21  Fleet Contras, MD  topiramate (TOPAMAX) 100 MG tablet Take 1 tablet by mouth daily 09/26/20     topiramate (TOPAMAX) 25 MG tablet Take 25 mg by mouth daily. Patient not taking: Reported on 12/03/2021 04/01/20   [provider]  traZODone (DESYREL) 50 MG tablet Take 50 mg by mouth at bedtime as needed for sleep (sleep).  [provider]  Vitamin D, Ergocalciferol, (DRISDOL) 1.25 MG (50000 UNIT) CAPS capsule Take 50,000 Units by mouth once a week. 01/09/20   [provider]      Allergies    Latex and Penicillins    Review of Systems   Review of Systems  Musculoskeletal:  Positive for arthralgias.  All other systems reviewed and are negative.   Physical Exam Updated Vital Signs BP (!) 122/51 (BP Location: Right Arm)   Pulse 74   Temp 97.9 F (36.6 C) (Oral)   Resp 18   Ht 5\' 9"  (1.753 m)   LMP 11/15/2021   SpO2 100%   BMI 34.70 kg/m  Physical Exam Vitals and nursing note reviewed.  Constitutional:      General: She is not in acute distress.    Appearance: Normal appearance. She is not ill-appearing, toxic-appearing or diaphoretic.  HENT:     Head: Normocephalic and atraumatic.     Nose: Nose normal.     Mouth/Throat:     Mouth: Mucous membranes are moist.     Pharynx: Oropharynx is clear.  Eyes:     Extraocular Movements: Extraocular movements intact.     Conjunctiva/sclera: Conjunctivae normal.     Pupils: Pupils are equal, round, and reactive to light.  Cardiovascular:     Rate and Rhythm: Normal rate and regular rhythm.  Pulmonary:     Effort: Pulmonary effort is normal.     Breath sounds: Normal breath sounds. No wheezing.   Abdominal:     General: Abdomen is flat. Bowel sounds are normal.     Palpations: Abdomen is soft.     Tenderness: There is no abdominal tenderness.  Musculoskeletal:        General: No swelling or deformity.     Cervical back: Normal range of motion and neck supple. No tenderness.     Comments: Tenderness throughout knee.  Full range of motion.  No overlying skin change, erythema or warmth.  Skin:    General: Skin is warm and dry.     Capillary Refill: Capillary refill takes less than 2 seconds.  Neurological:     Mental Status: She is alert and oriented to person, place, and time.     ED Results / Procedures / Treatments   Labs (all labs ordered are listed, but only abnormal results are displayed) Labs Reviewed - No data to display  EKG None  Radiology DG Knee Complete 4 Views Left  Result Date: 02/21/2023 CLINICAL DATA:  Pain EXAM: LEFT KNEE - COMPLETE 4+ VIEW COMPARISON:  Left knee x-ray 12/29/2019 FINDINGS: There is no acute fracture or dislocation. There is mild tricompartmental osteoarthrosis with joint space narrowing and osteophyte formation. Soft tissues are within normal limits. IMPRESSION: 1. No acute fracture or dislocation. 2. Mild tricompartmental osteoarthrosis. Electronically Signed   By: Darliss Cheney M.D.   On: 02/21/2023 19:47    Procedures Procedures   Medications Ordered in ED Medications  ketorolac (TORADOL) 15 MG/ML injection 15 mg (15 mg Intramuscular Given 02/21/23 1800)    ED Course/ Medical Decision Making/ A&P   Medical Decision Making Amount and/or Complexity of Data Reviewed Radiology: ordered.  Risk Prescription drug management.   52 year old female presents to the ED for evaluation.  Please see HPI for further details.  On exam patient is afebrile and nontachycardic.  She has no overlying skin change, erythema or warmth, swelling to her left knee.  She has full range of motion of her left knee.  Patient chart reviewed.  Patient  has chronic left knee pain.  Patient sees [facility] for pain control and is prescribed oxycodone.  Patient requesting x-ray.  X-ray unremarkable.  Patient provided Toradol injection.  Patient advised to follow-up with her chronic pain center.   Final Clinical Impression(s) / ED Diagnoses Final diagnoses:  Chronic pain of left knee    Rx / DC Orders ED Discharge Orders     None         Clent Ridges 02/21/23 Hildred Alamin, MD 02/21/23 2042

## 2023-02-21 NOTE — ED Triage Notes (Signed)
Pt c/o LT knee pain since yesterday; no fall, but feels like knee slipped; amb to triage

## 2023-04-09 ENCOUNTER — Encounter: Payer: Self-pay | Admitting: Physical Medicine & Rehabilitation

## 2023-04-09 ENCOUNTER — Encounter: Payer: Medicaid Other | Attending: Physical Medicine & Rehabilitation | Admitting: Physical Medicine & Rehabilitation

## 2023-04-09 VITALS — BP 108/69 | HR 83 | Ht 69.0 in | Wt 208.0 lb

## 2023-04-09 DIAGNOSIS — M25531 Pain in right wrist: Secondary | ICD-10-CM | POA: Insufficient documentation

## 2023-04-09 NOTE — Progress Notes (Signed)
Subjective:    Patient ID: Margaret Owens, female    DOB: [birth year], 52 y.o.   MRN: [id number]  HPI 52 year old female with chronic right-sided low back as well as right lower extremity pain associated with back pain who I have not seen in clinic for approximately 2 years.  She has been going to pain management with Margaret Owens, Dr. Emmaline Owens who is prescribing her oxycodone.  She has also been getting knee injections from Dr. Emmaline Owens.  She states that over the last several weeks she has been noticing increasing pain in the right lower extremity.  It is a radiating pain past the knee to the lateral calf.  She has no numbness or tingling or weakness in the right lower extremity at this time.  She denies any falls or trauma recently.  RIght L4-5 Translaminar Oct 13,2022 which helped pain down the RLE Pain Inventory Average Pain 5 Pain Right Now 10 My pain is tingling and aching  In the last 24 hours, has pain interfered with the following? General activity 10 Relation with others 10 Enjoyment of Owens 10 What TIME of day is your pain at its worst? daytime Sleep (in general) Poor  Pain is worse with: walking, bending, sitting, inactivity, standing, and some activites Pain improves with: heat/ice, therapy/exercise, pacing activities, medication, TENS, and injections Relief from Meds: 5  Family History  Problem Relation Age of Onset   Osteoarthritis Margaret Owens    Breast cancer Margaret Owens        spread to lungs    Margaret Owens    Margaret Owens    Irregular heart beat Margaret Owens    Asthma Margaret Owens    Social History   Socioeconomic History   Marital status: Single    Spouse name: Not on file   Number of children: Not on file   Years of education: Not on file   Highest education level: Not on file  Occupational History   Not on file  Tobacco Use   Smoking status: Never    Passive exposure: Past   Smokeless tobacco: Never  Vaping Use   Vaping status: Never Used  Substance and  Sexual Activity   Alcohol use: Not Currently    Comment: 1/week   Drug use: No   Sexual activity: Yes    Partners: Male    Birth control/protection: None  Other Topics Concern   Not on file  Social History Narrative   Not on file   Social Drivers of Health   Financial Resource Strain: Not on file  Food Insecurity: Food Insecurity Present (12/05/2022)   Hunger Vital Sign    Worried About Running Out of Food in the Last Year: Sometimes true    Ran Out of Food in the Last Year: Sometimes true  Transportation Needs: Unmet Transportation Needs (12/05/2022)   PRAPARE - Administrator, Civil Service (Medical): Yes    Lack of Transportation (Non-Medical): Yes  Physical Activity: Not on file  Stress: Not on file  Social Connections: Not on file   Past Surgical History:  Procedure Laterality Date   colonoscopy     Past Surgical History:  Procedure Laterality Date   colonoscopy     Past Medical History:  Diagnosis Date   Anxiety    Arthritis    Asthma    Bursitis    Depression    GERD (gastroesophageal reflux disease)    Hypertension    Muscle spasms of both lower extremities    PTSD (  post-traumatic stress disorder)    Sciatica    Seizures (HCC)    Uterine fibroid    BP 108/69   Pulse 83   Ht 5\' 9"  (1.753 m)   Wt 208 lb (94.3 kg)   LMP 11/15/2021   SpO2 98%   BMI 30.72 kg/m   Opioid Risk Score:   Fall Risk Score:  `1  Depression screen [facility] 2/9     05/30/2021    2:40 PM 03/14/2021    2:39 PM 01/23/2021    1:57 PM 10/04/2020    1:38 PM 08/29/2020    2:36 PM 07/02/2020   11:41 AM 05/16/2020   11:54 AM  Depression screen PHQ 2/9  Decreased Interest 1 2 0 1 1 0 2  Down, Depressed, Hopeless  2 0 1 1 0 3  PHQ - 2 Score 1 4 0 2 2 0 5  Altered sleeping       3  Tired, decreased energy       2  Change in appetite       3  Feeling bad or failure about yourself        3  Trouble concentrating       3  Moving slowly or fidgety/restless       0  Suicidal  thoughts       0  PHQ-9 Score       19     Review of Systems  Musculoskeletal:  Positive for back pain.       Right leg pain Right thumb pain Left knee and behind the knee pain  All other systems reviewed and are negative.     Objective:   Physical Exam Vitals and nursing note reviewed.  Constitutional:      Appearance: She is obese.  Eyes:     Extraocular Movements: Extraocular movements intact.     Conjunctiva/sclera: Conjunctivae normal.     Pupils: Pupils are equal, round, and reactive to light.  Neurological:     Mental Status: She is oriented to person, place, and time.     Sensory: Sensation is intact.     Motor: Motor function is intact.     Coordination: Coordination is intact.     Deep Tendon Reflexes:     Reflex Scores:      Patellar reflexes are 1+ on the right side and 1+ on the left side.      Achilles reflexes are 1+ on the right side and 1+ on the left side. Psychiatric:        Mood and Affect: Mood normal.        Behavior: Behavior normal.   Negative straight leg raising test Motor strength is 5/5 bilateral hip flexor knee extensor ankle dorsiflexor   Sacral thrust (prone) : Positive right Lateral compression: Positive at the greater trochanter only FABER's: Negative Distraction (supine): Negative Thigh thrust test: Negative        Assessment & Plan:   1.  Chronic low back pain as well as right lower extremity pain.  She does have history of right L4-5 nerve root mediated pain which did improve after translaminar epidural steroid injection approximately 2 years ago.  She has had increasing pain over the last few weeks.  Her current pain appears to be identical to her prior issues.  Would recommend repeat injection.  Will attempt insurance preauthorization. Do not feel she has recurrence of sacroiliac pain at this time.  Provocative testing negative

## 2023-05-13 ENCOUNTER — Encounter: Payer: Medicaid Other | Attending: Physical Medicine & Rehabilitation | Admitting: Physical Medicine & Rehabilitation

## 2023-05-13 ENCOUNTER — Encounter: Payer: Self-pay | Admitting: Physical Medicine & Rehabilitation

## 2023-05-13 VITALS — BP 116/72 | HR 77 | Temp 98.1°F | Ht 69.0 in | Wt 208.0 lb

## 2023-05-13 DIAGNOSIS — M48062 Spinal stenosis, lumbar region with neurogenic claudication: Secondary | ICD-10-CM | POA: Insufficient documentation

## 2023-05-13 MED ORDER — BETAMETHASONE SOD PHOS & ACET 6 (3-3) MG/ML IJ SUSP
9.0000 mg | Freq: Once | INTRAMUSCULAR | Status: AC
  Administered 2023-05-13: 9 mg via INTRAMUSCULAR

## 2023-05-13 MED ORDER — IOHEXOL 180 MG/ML  SOLN
3.0000 mL | Freq: Once | INTRAMUSCULAR | Status: AC
  Administered 2023-05-13: 3 mL

## 2023-05-13 NOTE — Patient Instructions (Signed)

## 2023-05-13 NOTE — Progress Notes (Signed)
L3-4 Right paramedian Lumbar epidural steroid injection under fluoroscopic guidance  Indication: Lumbosacral radiculitis is not relieved by medication management or other conservative care and interfering with self-care and mobility.  No  anticoagulant use.  Informed consent was obtained after describing risk and benefits of the procedure with the patient, this includes bleeding, bruising, infection, paralysis and medication side effects.  The patient wishes to proceed and has given written consent.  Patient was placed in a prone position.  The lumbar area was marked and prepped with Betadine.  It was entered with a 25-gauge 1-1/2 inch needle and one mL of 1% lidocaine was injected into the skin and subcutaneous tissue.  Then a 18-gauge 6" Tuohy spinal needle was inserted under fluoroscopic guidance into the RIght L3-4 interlaminar space under AP and Lateral imaging.  Once needle tip of approximated the posterior elements, a loss of resistance technique was utilized with lateral imaging.  A positive loss of resistance was obtained and then confirmed by injecting 2 mL's of Omnipaque 180.  Then a solution containing 1.5 mL's of 6mg /ml Celestone and 1.5 mL's of 1% lidocaine was injected.  The patient tolerated procedure well.  Post procedure instructions were given.  Please see post procedure form.  Omnipaque 180 2ml , 8ml waste Celestone 6mg  per ml 1.5 ml no waste Lidocaine 5ml MPF no waste

## 2023-05-13 NOTE — Progress Notes (Signed)
   Subjective:    Patient ID: Margaret Owens, female    DOB: 06/16/1970, 53 y.o.   MRN: [id number]  HPI   cardiopulmonary resuscitation  PROCEDURE RECORD [facility]   Name: KIMBERLI WINNE DOB:14-Jan-1971 MRN: [id number]  Date:05/13/2023  Physician: Claudette Laws, MD    Nurse/CMA: Charise Carwin MA  Allergies:  Allergies  Allergen Reactions   Latex Hives   Penicillins Hives    Has patient had a PCN reaction causing immediate rash, facial/tongue/throat swelling, SOB or lightheadedness with hypotension: No  Has patient had a PCN reaction causing severe rash involving mucus membranes or skin necrosis: No  Has patient had a PCN reaction that required hospitalization No  Has patient had a PCN reaction occurring within the last 10 years: No  If all of the above answers are "NO", then may proceed with Cephalosporin use.  Other Reaction(s): Unknown  Has patient had a PCN reaction causing immediate rash, facial/tongue/throat swelling, SOB or lightheadedness with hypotension: No, Has patient had a PCN reaction causing severe rash involving mucus membranes or skin necrosis: No, Has patient had a PCN reaction that required hospitalization No, Has patient had a PCN reaction occurring within the last 10 years: No, If all of the above answers are "NO", then may proceed with Cephalosporin use., Has patient had a PCN reaction causing immediate rash, facial/    Consent Signed: Yes.    Is patient diabetic? No.  CBG today? Borderline DM2 with no CBG   Pregnant: No. LMP: Patient's last menstrual period was 11/15/2021. (age 24-55)  Anticoagulants: no Anti-inflammatory: no Antibiotics: no  Procedure: ESI- Translaminar   Position: Prone Start Time: 2:13 pm  End Time: 2:24 pm  Fluoro Time: 50.3  RN/CMA Josemanuel Eakins MA Evalina Tabak MA    Time 1:52 pm 2:30 pm    BP 116/72 123/77    Pulse 77 95    Respirations 16 16    O2 Sat 98 100    S/S 6 6    Pain Level  8/10 0/10     D/C home with [facility], patient A & O X 3, D/C instructions reviewed, and sits independently.        Review of Systems     Objective:   Physical Exam        Assessment & Plan:

## 2023-05-21 LAB — LIPID PANEL
Cholesterol: 157
HDL: 65
LDL: 75
Triglycerides: 85 (ref 40–160)

## 2023-05-21 LAB — HEMOGLOBIN A1C: Hemoglobin A1C: 5.5

## 2023-05-21 NOTE — Progress Notes (Signed)
 Pt received  BP, A1C, Lipid screening. Please connect pt with transportation and food  resources and recheck about safety.

## 2023-05-25 ENCOUNTER — Encounter: Payer: Self-pay | Admitting: *Deleted

## 2023-05-25 NOTE — Progress Notes (Signed)
Pt attended 10/03/22 screening event, where her b/p was 121/80 and her blood sugar was 107, then later attended  12/05/22 events where her b/p was 93/74 and her blood sugar was 116. At the 12/05/22 event, event nurse noted Dr. Tyson Dense as her PCP, pt did not indicate having insurance, noted she was a non-smoker and documented food, transportation and safety issues for which she was given resources. Chart review note by Unice Bailey on 12/22/22 states she discussed pt's need to establish care with PCP - that she had been to [facility], [facility], [place] and was advised by Albertine Patricia to pick a PCP and establish ongoing care. RN Neva Seat also noted pt had been to [facility] but wanted to pick another [facility] provider also. PCP resources mailed to patient x 2 already. Chart review indicates pt has [facility] Medicaid so transportation available to medical and pharmacy appt as well as mental health options. [employer] benefits mailed to pt with [facility] and [facility] clinic options once again and in-basket sent to [employer] about pt's latest [place] equity event attendance on 05/21/23 with same SDOH listed. [place] also sent to pt. No additional health equity team support scheduled at this time.

## 2023-06-24 ENCOUNTER — Encounter: Payer: Self-pay | Admitting: Physical Medicine & Rehabilitation

## 2023-06-24 ENCOUNTER — Encounter: Payer: Medicaid Other | Attending: Physical Medicine & Rehabilitation | Admitting: Physical Medicine & Rehabilitation

## 2023-06-24 VITALS — BP 119/82 | HR 82 | Ht 69.0 in | Wt 201.0 lb

## 2023-06-24 DIAGNOSIS — M25531 Pain in right wrist: Secondary | ICD-10-CM | POA: Diagnosis not present

## 2023-06-24 DIAGNOSIS — M48062 Spinal stenosis, lumbar region with neurogenic claudication: Secondary | ICD-10-CM

## 2023-06-24 NOTE — Progress Notes (Signed)
 Subjective:    Patient ID: Margaret Owens, female    DOB: [birth year], 53 y.o.   MRN: [id number] L3-4 Right paramedian Lumbar epidural steroid injection under fluoroscopic guidance    05/13/23 HPI 53 year old patient with lumbar spinal stenosis severe at L4-5 L5-S1 region.  She had primary complaints of right thigh pain.  L3-L4 level was Reduced pain down Right thigh  Walking tolerance improved Primary complaint today is right wrist and thumb pain.  Denies any numbness or tingling.  No weakness in the hand.  No recent injuries no imaging studies in the past Pain Inventory Average Pain 6 Pain Right Now 9 My pain is constant, sharp, burning, dull, stabbing, tingling, and aching  In the last 24 hours, has pain interfered with the following? General activity 9 Relation with others 9 Enjoyment of life 0 What TIME of day is your pain at its worst? morning , daytime, evening, night, and varies Sleep (in general) Poor  Pain is worse with: walking, bending, sitting, inactivity, standing, and some activites Pain improves with: rest, therapy/exercise, medication, TENS, and heat Relief from Meds: 6  Family History  Problem Relation Age of Onset   Osteoarthritis Mother    Breast cancer Maternal Grandmother        spread to lungs    Healthy Son    Healthy Son    Irregular heart beat Daughter    Asthma Daughter    Social History   Socioeconomic History   Marital status: Single    Spouse name: Not on file   Number of children: Not on file   Years of education: Not on file   Highest education level: Not on file  Occupational History   Not on file  Tobacco Use   Smoking status: Never    Passive exposure: Past   Smokeless tobacco: Never  Vaping Use   Vaping status: Never Used  Substance and Sexual Activity   Alcohol use: Not Currently    Comment: 1/week   Drug use: No   Sexual activity: Yes    Partners: Male    Birth control/protection: None  Other Topics Concern   Not on  file  Social History Narrative   Not on file   Social Drivers of Health   Financial Resource Strain: Not on file  Food Insecurity: Food Insecurity Present (05/21/2023)   Hunger Vital Sign    Worried About Running Out of Food in the Last Year: Sometimes true    Ran Out of Food in the Last Year: Sometimes true  Transportation Needs: No Transportation Needs (05/21/2023)   PRAPARE - Administrator, Civil Service (Medical): No    Lack of Transportation (Non-Medical): No  Physical Activity: Not on file  Stress: Not on file  Social Connections: Not on file   Past Surgical History:  Procedure Laterality Date   colonoscopy     Past Surgical History:  Procedure Laterality Date   colonoscopy     Past Medical History:  Diagnosis Date   Anxiety    Arthritis    Asthma    Bursitis    Depression    GERD (gastroesophageal reflux disease)    Hypertension    Muscle spasms of both lower extremities    PTSD (post-traumatic stress disorder)    Sciatica    Seizures (HCC)    Uterine fibroid    LMP 11/15/2021   Opioid Risk Score:   Fall Risk Score:  `1  Depression screen [facility] 2/9  05/13/2023    1:49 PM 05/30/2021    2:40 PM 03/14/2021    2:39 PM 01/23/2021    1:57 PM 10/04/2020    1:38 PM 08/29/2020    2:36 PM 07/02/2020   11:41 AM  Depression screen PHQ 2/9  Decreased Interest 0 1 2 0 1 1 0  Down, Depressed, Hopeless 0  2 0 1 1 0  PHQ - 2 Score 0 1 4 0 2 2 0    Review of Systems  Musculoskeletal:        Pain in both hips, left knee, left wrist   All other systems reviewed and are negative.      Objective:   Physical Exam  Lumbar spine no tenderness to palpation Negative straight leg raise bilateral lower extremities Lower extremity strength is normal Right hand has thenar atrophy Sensation intact to light touch and pinprick in bilateral upper extremities Left hand has thenar atrophy as well Negative reverse Phalen's bilaterally Positive Tinel's at the  wrist There is pain with stress of the carpometacarpal joint right side.  Mildly prominent carpometacarpal joint on the right side. Negative Finkelstein's      Assessment & Plan:  #19.  53 year old female with lumbar spinal stenosis improved right thigh pain following L3-L4 translaminar injection.  Will monitor for durability of effect She follows with Dr. Percell Boston for her chronic pain medications 2.  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  Will check x-rays and schedule for carpal metacarpal joint injection under ultrasound guidance. If this is not helpful we may consider EMG/NCV to rule out median neuropathy at the wrist

## 2023-07-22 ENCOUNTER — Encounter: Attending: Physical Medicine & Rehabilitation | Admitting: Physical Medicine & Rehabilitation

## 2023-07-22 ENCOUNTER — Encounter: Payer: Self-pay | Admitting: Physical Medicine & Rehabilitation

## 2023-07-22 VITALS — BP 134/79 | HR 73 | Ht 69.0 in

## 2023-07-22 DIAGNOSIS — M79644 Pain in right finger(s): Secondary | ICD-10-CM | POA: Diagnosis not present

## 2023-07-22 DIAGNOSIS — M25531 Pain in right wrist: Secondary | ICD-10-CM | POA: Diagnosis not present

## 2023-07-22 MED ORDER — LIDOCAINE HCL 1 % IJ SOLN
1.0000 mL | Freq: Once | INTRAMUSCULAR | Status: AC
  Administered 2023-07-22: 1 mL

## 2023-07-22 MED ORDER — BETAMETHASONE SOD PHOS & ACET 6 (3-3) MG/ML IJ SUSP
3.0000 mg | Freq: Once | INTRAMUSCULAR | Status: AC
  Administered 2023-07-22: 3 mg via INTRA_ARTICULAR

## 2023-07-22 NOTE — Progress Notes (Signed)
 Ultrasound-guided right first carpometacarpal joint injection  Indication right first carpometacarpal  joint pain Pain does not respond to medication management and other conservative care  Informed consent was obtained describing risk and benefits of procedure patient is include bleeding bruising and infection.  Patient placed in a seated position and placed on the medial aspect with radial aspect towards ceiling. Hockey-stick 13 Hz transducer utilized Carpometacarpal joint identified. Neurovascular structures identified Skin wheal raised on lateral wrist with 30-gauge 1/2 inch needle 1/2 mL of 1% lidocaine Then a 25g 1.5 in needle was directed into the right first carpometacarpal joint.  Both in plane as well as out of plane imaging was utilized.  Once needle was determined to be in the joint space, 0.25 mL of 6 mL/ CC Celestone and 0.25 mL of 1% lidocaine were injected.  Patient tolerated procedure well.  Postprocedure instructions given.

## 2023-08-19 ENCOUNTER — Encounter: Payer: Self-pay | Admitting: Physical Medicine & Rehabilitation

## 2023-08-19 ENCOUNTER — Encounter: Attending: Physical Medicine & Rehabilitation | Admitting: Physical Medicine & Rehabilitation

## 2023-08-19 VITALS — BP 101/65 | HR 97 | Ht 69.0 in | Wt 202.0 lb

## 2023-08-19 DIAGNOSIS — R269 Unspecified abnormalities of gait and mobility: Secondary | ICD-10-CM | POA: Diagnosis present

## 2023-08-19 DIAGNOSIS — R200 Anesthesia of skin: Secondary | ICD-10-CM | POA: Diagnosis present

## 2023-08-19 DIAGNOSIS — R202 Paresthesia of skin: Secondary | ICD-10-CM | POA: Diagnosis present

## 2023-08-19 NOTE — Progress Notes (Signed)
 Subjective:    Patient ID: Margaret Owens, female    DOB: 21-Dec-1970, 53 y.o.   MRN: [id number]  HPI RIght L4-5 Translaminar Oct 13,2022 which helped pain down the RLE 05/13/2023 L3-4 Right paramedian Lumbar epidural steroid injection under fluoroscopic guidance 07/22/2023 1st [facility] injection partially helpful for Right thumb pain  Has problems with lifting with both hands  Has no clear-cut numbness and tingling on the right side on the left side she complains of digits 3 and 4 is getting numb.  She does have some nocturnal symptoms as well. She has had no new injuries of her hands or wrists. Her low back is doing okay as well as her lower extremity pain.   Pain Inventory Average Pain 5 Pain Right Now 5 My pain is constant, sharp, and aching  In the last 24 hours, has pain interfered with the following? General activity 5 Relation with others 2 Enjoyment of life 5 What TIME of day is your pain at its worst? morning  and night Sleep (in general) Poor  Pain is worse with: walking, bending, sitting, standing, and some activites Pain improves with: rest, medication, TENS, injections, and heat Relief from Meds: 8  Family History  Problem Relation Age of Onset   Osteoarthritis Mother    Breast cancer Maternal Grandmother        spread to lungs    Healthy Son    Healthy Son    Irregular heart beat Daughter    Asthma Daughter    Social History   Socioeconomic History   Marital status: Single    Spouse name: Not on file   Number of children: Not on file   Years of education: Not on file   Highest education level: Not on file  Occupational History   Not on file  Tobacco Use   Smoking status: Never    Passive exposure: Past   Smokeless tobacco: Never  Vaping Use   Vaping status: Never Used  Substance and Sexual Activity   Alcohol use: Not Currently    Comment: 1/week   Drug use: No   Sexual activity: Yes    Partners: Male    Birth control/protection: None  Other  Topics Concern   Not on file  Social History Narrative   Not on file   Social Drivers of Health   Financial Resource Strain: Not on file  Food Insecurity: Food Insecurity Present (05/21/2023)   Hunger Vital Sign    Worried About Running Out of Food in the Last Year: Sometimes true    Ran Out of Food in the Last Year: Sometimes true  Transportation Needs: No Transportation Needs (05/21/2023)   PRAPARE - Administrator, Civil Service (Medical): No    Lack of Transportation (Non-Medical): No  Physical Activity: Not on file  Stress: Not on file  Social Connections: Not on file   Past Surgical History:  Procedure Laterality Date   colonoscopy     Past Surgical History:  Procedure Laterality Date   colonoscopy     Past Medical History:  Diagnosis Date   Anxiety    Arthritis    Asthma    Bursitis    Depression    GERD (gastroesophageal reflux disease)    Hypertension    Muscle spasms of both lower extremities    PTSD (post-traumatic stress disorder)    Sciatica    Seizures (HCC)    Uterine fibroid    BP 101/65   Pulse 97  Ht 5\' 9"  (1.753 m)   Wt 202 lb (91.6 kg)   LMP 11/15/2021   SpO2 99%   BMI 29.83 kg/m   Opioid Risk Score:   Fall Risk Score:  `1  Depression screen [facility] 2/9     08/19/2023    2:46 PM 08/19/2023    2:40 PM 07/22/2023    1:49 PM 06/24/2023    1:38 PM 05/13/2023    1:49 PM 05/30/2021    2:40 PM 03/14/2021    2:39 PM  Depression screen PHQ 2/9  Decreased Interest 1 0 0 0 0 1 2  Down, Depressed, Hopeless 1 0 0 0 0  2  PHQ - 2 Score 2 0 0 0 0 1 4    Review of Systems  Musculoskeletal:  Positive for back pain and neck pain.       Right wrist pain, pain in fingers on left hand, left knee pain  All other systems reviewed and are negative.      Objective:   Physical Exam  2 pt discrimination 5mm bilaterally on index Thenar muscle atrophy  Sensation to pinprick mildly reduced bilateral upper extremities. Negative Tinel's  bilaterally Negative Phalen's Motor strength is 5/5 bilateral deltoid plus tricep grip Hands have no evidence of wrist deformity there is slight prominence at the base of the thumb on the right side.  No MCP PIP or DIP effusions noted.       Assessment & Plan:  1.  Bilateral hand discomfort fairly vague symptoms although she does have quite marked bilateral thenar atrophy.  I suspect she may have median compression at the wrist but needs EMG/NCV to further evaluate. 2.  Right carpal metacarpal osteoarthritis partial relief with ultrasound-guided injection. 3.  Lumbar spinal stenosis has done well with epidural steroid injections.  Can repeat as needed She also follows up with Dr. Talmadge Fail physical medicine and re[facility] at [facility]

## 2023-08-19 NOTE — Patient Instructions (Signed)
Electromyoneurogram Electromyoneurogram is a test to check how well your muscles and nerves are working. This procedure includes the combined use of electromyogram (EMG) and nerve conduction study (NCS). EMG is used to evaluate muscles and the nerves that control those muscles. NCS, which is also called electroneurogram, measures how well your nerves conduct electricity. The procedures should be done together to check if your muscles and nerves are healthy. If the results of the tests are abnormal, this may indicate disease or injury, such as a neuromuscular disease or peripheral nerve damage. Tell a health care provider about: Any allergies you have. All medicines you are taking, including vitamins, herbs, eye drops, creams, and over-the-counter medicines. Any bleeding problems you have. Any surgeries you have had. Any medical conditions you have. What are the risks? Generally, this is a safe procedure. However, problems may occur, including: Bleeding or bruising. Infection where the electrodes were inserted. What happens before the test? Medicines Take all of your usually prescribed medications before this testing is performed. Do not stop your blood thinners unless advised by your prescribing physician. General instructions Your health care provider may ask you to warm the limb that will be checked with warm water, hot pack, or wrapping the limb in a blanket. Do not use lotions or creams on the same day that you will be having the procedure. What happens during the test? For EMG  Your health care provider will ask you to stay in a position so that the muscle being studied can be accessed. You will be sitting or lying down. You may be given a medicine to numb the area (local anesthetic) and the skin will be disinfected. A very thin needle that has an electrode will be inserted into your muscle, one muscle at a time. Typically, multiple muscles are evaluated during a single study. Another  small electrode will be placed on your skin near the muscle. Your health care provider will ask you to continue to remain still. The electrodes will record the electrical activity of your muscles. You may see this on a monitor or hear it in the room. After your muscles have been studied at rest, your health care provider will ask you to contract or flex your muscles. The electrodes will record the electrical activity of your muscles. Your health care provider will remove the electrodes and the electrode needle when the procedure is finished. The procedure may vary among health care providers and hospitals. For NCS  An electrode that records your nerve activity (recording electrode) will be placed on your skin by the muscle that is being studied. An electrode that is used as a reference (reference electrode) will be placed near the recording electrode. A paste or gel will be applied to your skin between the recording electrode and the reference electrode. Your nerve will be stimulated with a mild shock. The speed of the nerves and strength of response is recorded by the electrodes. Your health care provider will remove the electrodes and the gel when the procedure is finished. The procedure may vary among health care providers and hospitals. What can I expect after the test? It is up to you to get your test results. Ask your health care provider, or the department that is doing the test, when your results will be ready. Your health care provider may: Give you medicines for any pain. Monitor the insertion sites to make sure that bleeding stops. You should be able to drive yourself to and from the test. Discomfort can  persist for a few hours after the test, but should be better the next day. Contact a health care provider if: You have swelling, redness, or drainage at any of the insertion sites. Summary Electromyoneurogram is a test to check how well your muscles and nerves are working. If the  results of the tests are abnormal, this may indicate disease or injury. This is a safe procedure. However, problems may occur, such as bleeding and infection. Your health care provider will do two tests to complete this procedure. One checks your muscles (EMG) and another checks your nerves (NCS). It is up to you to get your test results. Ask your health care provider, or the department that is doing the test, when your results will be ready. This information is not intended to replace advice given to you by your health care provider. Make sure you discuss any questions you have with your health care provider. Document Revised: 12/11/2020 Document Reviewed: 11/10/2020 Elsevier Patient Education  2024 [employer].

## 2023-09-09 ENCOUNTER — Ambulatory Visit: Attending: Physical Medicine & Rehabilitation | Admitting: Physical Therapy

## 2023-09-09 ENCOUNTER — Encounter: Payer: Self-pay | Admitting: Physical Therapy

## 2023-09-09 DIAGNOSIS — M6281 Muscle weakness (generalized): Secondary | ICD-10-CM | POA: Insufficient documentation

## 2023-09-09 DIAGNOSIS — R2689 Other abnormalities of gait and mobility: Secondary | ICD-10-CM | POA: Insufficient documentation

## 2023-09-09 DIAGNOSIS — R269 Unspecified abnormalities of gait and mobility: Secondary | ICD-10-CM | POA: Diagnosis not present

## 2023-09-09 DIAGNOSIS — G8929 Other chronic pain: Secondary | ICD-10-CM | POA: Insufficient documentation

## 2023-09-09 DIAGNOSIS — M25562 Pain in left knee: Secondary | ICD-10-CM | POA: Insufficient documentation

## 2023-09-09 NOTE — Therapy (Signed)
 OUTPATIENT PHYSICAL THERAPY NEURO EVALUATION   Patient Name: OTILA STARN MRN: [id number] DOB:1970/10/26, 53 y.o., female 56 Date: 09/09/2023   PCP: Charle Congo, MD REFERRING PROVIDER: Genetta Kenning, MD  END OF SESSION:  PT End of Session - 09/09/23 1456     Visit Number 1    Number of Visits 13    Date for PT Re-Evaluation 10/28/23    Authorization Type [employer]    PT Start Time 1449    PT Stop Time 1520    PT Time Calculation (min) 31 min    Activity Tolerance Patient tolerated treatment well    Behavior During Therapy WFL for tasks assessed/performed             Past Medical History:  Diagnosis Date   Anxiety    Arthritis    Asthma    Bursitis    Depression    GERD (gastroesophageal reflux disease)    Hypertension    Muscle spasms of both lower extremities    PTSD (post-traumatic stress disorder)    Sciatica    Seizures (HCC)    Uterine fibroid    Past Surgical History:  Procedure Laterality Date   colonoscopy     Patient Active Problem List   Diagnosis Date Noted   Gait disorder 08/19/2023   Positive ANA (antinuclear antibody) 12/03/2021   Urticaria 12/03/2021   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 12/03/2021   Spinal stenosis of lumbar region with radiculopathy 01/23/2021   COVID-19 vaccination declined 05/16/2020   Prediabetes 05/16/2020   Obesity (BMI 35.0-39.9 without comorbidity) 05/16/2020   Primary osteoarthritis of left knee 07/04/2018   Uterine fibroid 06/09/2017   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 States she uses stroller to hold her belongings, such as her backpack.  States she feels like she gets tripped up over her feet but has not fallen. Has OA in her L knee, wearing a knee brace. Stays fairly active with her grandkids but does not feel stable. Likes to practice yoga.    Pt accompanied by: self  PERTINENT HISTORY: anxiety, GERD, HTN,  PTSD, sciatica, seizures  PAIN:  Are you having pain? Yes: NPRS scale: 6/10 Pain location: L knee, L middle digits, R thumb and first digit Pain description: Achy/throbbing  Aggravating factors: None Relieving factors: Oxycodone , Voltaren    PRECAUTIONS: Fall  RED FLAGS: None   WEIGHT BEARING RESTRICTIONS: No  FALLS: Has patient fallen in last 6 months? No  LIVING ENVIRONMENT: Lives with: lives with their daughter Lives in: House/apartment Stairs: No Has following equipment at home: Counselling psychologist, Environmental consultant - 2 wheeled, Marine scientist  PLOF: Independent  PATIENT GOALS: "to learn some things to build up my muscle mass and strengthen my legs so I can be balanced more"   OBJECTIVE:  Note: Objective measures were completed at Evaluation unless otherwise noted.  DIAGNOSTIC FINDINGS: MRI of lumbar spine from 08/2020  IMPRESSION: 1. Degenerative changes of the lumbar spine with progressed anterolisthesis at L4-5 where there is mild-to-moderate spinal canal stenosis with narrowing of the bilateral subarticular zones, severe right and moderate left neural foraminal narrowing. 2. Moderate right and severe left neural foraminal narrowing at L5-S1, unchanged.  COGNITION: Overall cognitive status: Difficulty to assess due to: no family present   SENSATION: Pt reports occasional numbness in LLE and frequent tingling in bilateral hands    POSTURE: rounded shoulders, forward head, and posterior pelvic  tilt  LOWER EXTREMITY ROM:     Active  Right Eval Left Eval  Hip flexion    Hip extension    Hip abduction    Hip adduction    Hip internal rotation    Hip external rotation    Knee flexion    Knee extension  Lacking full ROM due to pain, not formally assessed  Ankle dorsiflexion    Ankle plantarflexion    Ankle inversion    Ankle eversion     (Blank rows = not tested)  LOWER EXTREMITY MMT:  Tested in seated position   MMT Right Eval Left Eval  Hip flexion 4- 4-  Hip extension    Hip abduction 3+ 3+  Hip adduction 3+ 3+  Hip internal rotation    Hip external rotation    Knee flexion 4+ 3+  Knee extension 4+ 3+  Ankle dorsiflexion 5 5  Ankle plantarflexion    Ankle inversion    Ankle eversion    (Blank rows = not tested)  BED MOBILITY:  Not tested Pt reports independence w/this   TRANSFERS: Sit to stand: Modified independence  Assistive device utilized: None     Stand to sit: Modified independence  Assistive device utilized: None      RAMP:  Not tested  CURB:  Not tested  STAIRS: Not tested GAIT: Gait pattern: step through pattern, decreased arm swing- Right, decreased arm swing- Left, decreased stride length, knee flexed in stance- Left, antalgic, lateral hip instability, lateral lean- Left, decreased trunk rotation, trunk flexed, and wide BOS Distance walked: Various clinic distances  Assistive device utilized: None Level of assistance: SBA Comments: Pt w/antalgic gait pattern but no overt LOB noted. Good sequencing noted on turns.    FUNCTIONAL TESTS:   [facility] PT Assessment - 09/09/23 1509       Transfers   Five time sit to stand comments  30.79s   Hands on knees, RLE placed posteriorly, knees and hips maintained in flexion     Ambulation/Gait   Gait velocity 32.8' over 15.22s = 2.15 ft/s no AD  TREATMENT:   Provided information (handout) on aquatic therapy and pt verbalized understanding    PATIENT EDUCATION: Education details: POC, eval findings, aquatic therapy, plan to obtain OT referral Person educated: Patient Education method: Explanation, Demonstration, and Handouts Education comprehension: verbalized understanding and needs further education  HOME EXERCISE PROGRAM: To be established   GOALS: Goals reviewed with patient? Yes  SHORT TERM GOALS: Target date: 10/10/2023   Pt will be independent with initial HEP for improved strength, balance, transfers and gait.  Baseline: Goal status: INITIAL  2.  FGA to be assessed and LTG updated  Baseline:  Goal status: INITIAL  3.  Pt will improve gait velocity to at least 2.5 ft/s w/LRAD for improved gait efficiency and independence   Baseline: 2.15 ft/s no AD Goal status: INITIAL  4.  Pt will improve 5 x STS to less than or equal to 25 seconds to demonstrate improved functional strength and transfer efficiency.   Baseline: 30.79s  Goal status: INITIAL   LONG TERM GOALS: Target date: 10/24/2023   Pt will be independent with final HEP for improved strength, balance, transfers and gait.  Baseline:  Goal status: INITIAL  2.  FGA goal Baseline:  Goal status: INITIAL  3.  Pt will improve gait velocity to at least 2.8 ft/s w/LRAD for improved gait efficiency and stability   Baseline: 2.15 ft/s no AD Goal status: INITIAL  4.  Pt will improve 5 x STS to less than or equal to 20 seconds to demonstrate improved functional strength and transfer efficiency.   Baseline: 30.79s  Goal status: INITIAL   ASSESSMENT:  CLINICAL IMPRESSION: Patient is a 53 year old female referred to Neuro OPPT for gait abnormality. Pt's PMH is significant for: anxiety, GERD, HTN, PTSD, sciatica, seizures. The following deficits were present during the exam: impaired functional strength, decreased functional ROM, impaired balance,  impaired sensation and decreased safety awareness. Based on gait speed and 5x STS, pt is an incr risk for falls. Pt would benefit from skilled PT to address these impairments and functional limitations to maximize functional mobility independence.    OBJECTIVE IMPAIRMENTS: Abnormal gait, decreased activity tolerance, decreased balance, decreased cognition, decreased endurance, decreased knowledge of use of DME, decreased mobility, difficulty walking, decreased ROM, decreased strength, decreased safety awareness, impaired sensation, impaired UE functional use, improper body mechanics, postural dysfunction, and pain  ACTIVITY LIMITATIONS: carrying, lifting, bending, standing, squatting, stairs, locomotion level, and caring for others  PARTICIPATION LIMITATIONS: cleaning, interpersonal relationship, driving, shopping, community activity, occupation, and yard work  PERSONAL FACTORS: Behavior pattern, Fitness, Transportation, and 1-2 comorbidities: L knee OA and sciatica are also affecting patient's functional outcome.   REHAB POTENTIAL: Good  CLINICAL DECISION MAKING: Evolving/moderate complexity  EVALUATION COMPLEXITY: Moderate  PLAN:  PT FREQUENCY: 1-2x/week  PT DURATION: 6 weeks  PLANNED INTERVENTIONS: 97164- PT Re-evaluation, 97750- Physical Performance Testing, 97110-Therapeutic exercises, 97530- Therapeutic activity, 97112- Neuromuscular re-education, 97535- Self Care, 16109- Manual therapy, [phone]- Gait training, [phone]- Aquatic Therapy, Patient/Family education, Balance training, Stair training, Dry Needling, Joint mobilization, Spinal mobilization, Vestibular training, and DME instructions  PLAN FOR NEXT SESSION: Assess FGA and update goal. Monitor vitals. Establish HEP (sit to stands, monsters, march overs, supine ther Ex)    Wane Mollett E Svea Pusch, PT 09/09/2023, 3:26 PM

## 2023-09-12 ENCOUNTER — Telehealth: Payer: Self-pay | Admitting: Physical Therapy

## 2023-09-12 DIAGNOSIS — M79641 Pain in right hand: Secondary | ICD-10-CM

## 2023-09-12 NOTE — Telephone Encounter (Signed)
 Dr. Sharl Davies,  Margaret Owens  was evaluated by PT on 09/09/2023.  The patient would benefit from OT evaluation for bilateral hand pain and weakness.    If you agree, please place an order in [facility] workque in [facility] or fax the order to [phone].  Thank you,  Jo Mouse, PT, DPT [facility] [address] [place], [place]  [address] Phone:  [phone] Fax:  [phone]

## 2023-09-20 ENCOUNTER — Ambulatory Visit: Attending: Physical Medicine & Rehabilitation | Admitting: Physical Therapy

## 2023-09-20 VITALS — BP 134/60 | HR 60

## 2023-09-20 DIAGNOSIS — R2689 Other abnormalities of gait and mobility: Secondary | ICD-10-CM | POA: Insufficient documentation

## 2023-09-20 DIAGNOSIS — M25562 Pain in left knee: Secondary | ICD-10-CM | POA: Diagnosis present

## 2023-09-20 DIAGNOSIS — M6281 Muscle weakness (generalized): Secondary | ICD-10-CM | POA: Insufficient documentation

## 2023-09-20 DIAGNOSIS — G8929 Other chronic pain: Secondary | ICD-10-CM | POA: Insufficient documentation

## 2023-09-20 NOTE — Therapy (Signed)
 OUTPATIENT PHYSICAL THERAPY NEURO TREATMENT   Patient Name: NOVALYN LAJARA MRN: [id number] DOB:07/17/1970, 53 y.o., female Today's Date: 09/20/2023   PCP: Charle Congo, MD REFERRING PROVIDER: Genetta Kenning, MD  END OF SESSION:  PT End of Session - 09/20/23 1402     Visit Number 2    Number of Visits 13    Date for PT Re-Evaluation 10/28/23    Authorization Type  Medicaid    PT Start Time 1401    PT Stop Time 1443    PT Time Calculation (min) 42 min    Activity Tolerance Patient tolerated treatment well    Behavior During Therapy WFL for tasks assessed/performed              Past Medical History:  Diagnosis Date   Anxiety    Arthritis    Asthma    Bursitis    Depression    GERD (gastroesophageal reflux disease)    Hypertension    Muscle spasms of both lower extremities    PTSD (post-traumatic stress disorder)    Sciatica    Seizures (HCC)    Uterine fibroid    Past Surgical History:  Procedure Laterality Date   colonoscopy     Patient Active Problem List   Diagnosis Date Noted   Gait disorder 08/19/2023   Positive ANA (antinuclear antibody) 12/03/2021   Urticaria 12/03/2021   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 12/03/2021   Spinal stenosis of lumbar region with radiculopathy 01/23/2021   COVID-19 vaccination declined 05/16/2020   Prediabetes 05/16/2020   Obesity (BMI 35.0-39.9 without comorbidity) 05/16/2020   Primary osteoarthritis of left knee 07/04/2018   Uterine fibroid 06/09/2017   Endometrial stromal nodule 06/09/2017    ONSET DATE: 08/19/2023 (referral)  REFERRING DIAG: R26.9 (ICD-10-CM) - Gait disorder  THERAPY DIAG:  Other abnormalities of gait and mobility  Muscle weakness (generalized)  Chronic pain of left knee  Rationale for Evaluation and Treatment: Rehabilitation  SUBJECTIVE:                                                                                                                                                                                              SUBJECTIVE STATEMENT: Pt presents w/stroller. Reports L knee is "giving me a fit". Denies falls or acute changes     Pt accompanied by: self  PERTINENT HISTORY: anxiety, GERD, HTN, PTSD, sciatica, seizures  PAIN:  Are you having pain? Yes: NPRS scale: 6-7/10 Pain location: L knee, L middle digits, R thumb and first digit Pain description: Achy/throbbing  Aggravating factors: None Relieving factors: Oxycodone , Voltaren    PRECAUTIONS:  Fall  RED FLAGS: None   WEIGHT BEARING RESTRICTIONS: No  FALLS: Has patient fallen in last 6 months? No  LIVING ENVIRONMENT: Lives with: lives with their daughter Lives in: House/apartment Stairs: No Has following equipment at home: Counselling psychologist, Environmental consultant - 2 wheeled, Marine scientist  PLOF: Independent  PATIENT GOALS: "to learn some things to build up my muscle mass and strengthen my legs so I can be balanced more"   OBJECTIVE:  Note: Objective measures were completed at Evaluation unless otherwise noted.  DIAGNOSTIC FINDINGS: MRI of lumbar spine from 08/2020  IMPRESSION: 1. Degenerative changes of the lumbar spine with progressed anterolisthesis at L4-5 where there is mild-to-moderate spinal canal stenosis with narrowing of the bilateral subarticular zones, severe right and moderate left neural foraminal narrowing. 2. Moderate right and severe left neural foraminal narrowing at L5-S1, unchanged.  COGNITION: Overall cognitive status: Difficulty to assess due to: no family present   SENSATION: Pt reports occasional numbness in LLE and frequent tingling in bilateral hands    POSTURE: rounded shoulders, forward head, and posterior pelvic tilt  LOWER EXTREMITY ROM:     Active  Right Eval Left Eval  Hip flexion    Hip extension    Hip abduction    Hip adduction    Hip internal rotation    Hip external rotation    Knee flexion    Knee extension   Lacking full ROM due to pain, not formally assessed  Ankle dorsiflexion    Ankle plantarflexion    Ankle inversion    Ankle eversion     (Blank rows = not tested)  LOWER EXTREMITY MMT:  Tested in seated position   MMT Right Eval Left Eval  Hip flexion 4- 4-  Hip extension    Hip abduction 3+ 3+  Hip adduction 3+ 3+  Hip internal rotation    Hip external rotation    Knee flexion 4+ 3+  Knee extension 4+ 3+  Ankle dorsiflexion 5 5  Ankle plantarflexion    Ankle inversion    Ankle eversion    (Blank rows = not tested)  BED MOBILITY:  Not tested Pt reports independence w/this   TRANSFERS: Sit to stand: Modified independence  Assistive device utilized: None     Stand to sit: Modified independence  Assistive device utilized: None      RAMP:  Not tested  CURB:  Not tested  STAIRS: Not tested GAIT: Gait pattern: step through pattern, decreased arm swing- Right, decreased arm swing- Left, decreased stride length, knee flexed in stance- Left, scissoring, antalgic, lateral hip instability, lateral lean- Left, decreased trunk rotation, trunk flexed, and wide BOS Distance walked: Various clinic distances  Assistive device utilized: Stroller and None Level of assistance: Modified independence Comments: Noted scissoring of BLEs this date (LLE >RLE). Pt ambulated into/out of session w/rollator but throughout session w/no AD at mod I level   VITALS  Vitals:   09/20/23 1404  BP: 134/60  Pulse: 60  TREATMENT:   Self-care/home management  Assessed vitals in LUE(see above) and WNL   Ther Act  SciFit multi-peaks level 5 for 8 minutes using BUE/BLEs for neural priming for reciprocal movement, dynamic cardiovascular warmup and increased amplitude of stepping. Cued pt to maintain steps/min >65 throughout. RPE of 8/10 following activity  The following were performed for improved  functional BLE strength, transfers and pain modulation:  Lateral/fwd/retro monster walks w/green theraband around distal quads, x3 reps each direction w/intermittent UE support on bar. Added to HEP (see bolded below)  Seated knee extension w/resistance (green theraband around ankles), x10 reps per side. Added to HEP (see bolded below)  Seated march overs using 12# KB, x15 reps per side. Min cues to lift leg over KB and not around it  Sit to stands x10 reps. Noted significant weight shift to R side. Added to HEP (see bolded below)  Supine glute bridges, x15 reps. Decreased ROM noted w/activity. Added to HEP (see bolded below)    Pt rated pain as 9/10 in L knee following session    PATIENT EDUCATION: Education details: Initial HEP Person educated: Patient Education method: Explanation, Demonstration, and Handouts Education comprehension: verbalized understanding and needs further education  HOME EXERCISE PROGRAM: Access Code: [email or web address] URL: [email or web address] Date: 09/20/2023 Prepared by: Burleigh Carp Emmons Toth  Exercises - Side Stepping with Resistance at Thighs and Counter Support  - 1 x daily - 7 x weekly - 3 sets - 10 reps - Forward Backward Monster Walk with Band at [employer] and Counter Support  - 1 x daily - 7 x weekly - 3 sets - 10 reps - Seated Knee Extension with Resistance  - 1 x daily - 7 x weekly - 3 sets - 10 reps - Sit to Stand Without Arm Support  - 1 x daily - 7 x weekly - 3 sets - 10 reps - Supine Bridge  - 1 x daily - 7 x weekly - 3 sets - 10 reps  GOALS: Goals reviewed with patient? Yes  SHORT TERM GOALS: Target date: 10/10/2023   Pt will be independent with initial HEP for improved strength, balance, transfers and gait.  Baseline: Goal status: INITIAL  2.  FGA to be assessed and LTG updated  Baseline:  Goal status: INITIAL  3.  Pt will improve gait velocity to at least 2.5 ft/s w/LRAD for improved gait efficiency and independence   Baseline: 2.15  ft/s no AD Goal status: INITIAL  4.  Pt will improve 5 x STS to less than or equal to 25 seconds to demonstrate improved functional strength and transfer efficiency.   Baseline: 30.79s  Goal status: INITIAL   LONG TERM GOALS: Target date: 10/24/2023   Pt will be independent with final HEP for improved strength, balance, transfers and gait.  Baseline:  Goal status: INITIAL  2.  FGA goal Baseline:  Goal status: INITIAL  3.  Pt will improve gait velocity to at least 2.8 ft/s w/LRAD for improved gait efficiency and stability   Baseline: 2.15 ft/s no AD Goal status: INITIAL  4.  Pt will improve 5 x STS to less than or equal to 20 seconds to demonstrate improved functional strength and transfer efficiency.   Baseline: 30.79s  Goal status: INITIAL   ASSESSMENT:  CLINICAL IMPRESSION: Emphasis of skilled PT session on establishing initial HEP for improved functional BLE strength and transfers. Pt limited by L knee pain, increasing from 6 to 9/10 by end of session. Pt favors RLE during activity and w/gait, so  educated pt on importance of using LLE to maintain strength and for pain modulation. Continue POC.    OBJECTIVE IMPAIRMENTS: Abnormal gait, decreased activity tolerance, decreased balance, decreased cognition, decreased endurance, decreased knowledge of use of DME, decreased mobility, difficulty walking, decreased ROM, decreased strength, decreased safety awareness, impaired sensation, impaired UE functional use, improper body mechanics, postural dysfunction, and pain  ACTIVITY LIMITATIONS: carrying, lifting, bending, standing, squatting, stairs, locomotion level, and caring for others  PARTICIPATION LIMITATIONS: cleaning, interpersonal relationship, driving, shopping, community activity, occupation, and yard work  PERSONAL FACTORS: Behavior pattern, Fitness, Transportation, and 1-2 comorbidities: L knee OA and sciatica are also affecting patient's functional outcome.   REHAB  POTENTIAL: Good  CLINICAL DECISION MAKING: Evolving/moderate complexity  EVALUATION COMPLEXITY: Moderate  PLAN:  PT FREQUENCY: 1-2x/week  PT DURATION: 6 weeks  PLANNED INTERVENTIONS: 97164- PT Re-evaluation, 97750- Physical Performance Testing, 97110-Therapeutic exercises, 97530- Therapeutic activity, 97112- Neuromuscular re-education, 97535- Self Care, 36644- Manual therapy, [phone]- Gait training, [phone]- Aquatic Therapy, Patient/Family education, Balance training, Stair training, Dry Needling, Joint mobilization, Spinal mobilization, Vestibular training, and DME instructions  PLAN FOR NEXT SESSION: Assess FGA and update goal. Monitor vitals. Add to HEP prn. Single leg stability, eccentric quad strength, posterior chain strength    Monet [place] E Lain Tetterton, PT, DPT 09/20/2023, 2:43 PM

## 2023-09-23 ENCOUNTER — Ambulatory Visit: Admitting: Physical Therapy

## 2023-09-23 DIAGNOSIS — R2689 Other abnormalities of gait and mobility: Secondary | ICD-10-CM | POA: Diagnosis not present

## 2023-09-23 DIAGNOSIS — G8929 Other chronic pain: Secondary | ICD-10-CM

## 2023-09-23 DIAGNOSIS — M6281 Muscle weakness (generalized): Secondary | ICD-10-CM

## 2023-09-23 NOTE — Therapy (Signed)
 OUTPATIENT PHYSICAL THERAPY NEURO TREATMENT   Patient Name: Margaret Owens MRN: [id number] DOB:1970-09-19, 53 y.o., female 58 Date: 09/23/2023   PCP: Charle Congo, MD REFERRING PROVIDER: Genetta Kenning, MD  END OF SESSION:  PT End of Session - 09/23/23 1450     Visit Number 3    Number of Visits 13    Date for PT Re-Evaluation 10/28/23    Authorization Type [place] Medicaid    PT Start Time 1448    PT Stop Time 1528    PT Time Calculation (min) 40 min    Activity Tolerance Patient tolerated treatment well    Behavior During Therapy WFL for tasks assessed/performed            Past Medical History:  Diagnosis Date   Anxiety    Arthritis    Asthma    Bursitis    Depression    GERD (gastroesophageal reflux disease)    Hypertension    Muscle spasms of both lower extremities    PTSD (post-traumatic stress disorder)    Sciatica    Seizures (HCC)    Uterine fibroid    Past Surgical History:  Procedure Laterality Date   colonoscopy     Patient Active Problem List   Diagnosis Date Noted   Gait disorder 08/19/2023   Positive ANA (antinuclear antibody) 12/03/2021   Urticaria 12/03/2021   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 12/03/2021   Spinal stenosis of lumbar region with radiculopathy 01/23/2021   COVID-19 vaccination declined 05/16/2020   Prediabetes 05/16/2020   Obesity (BMI 35.0-39.9 without comorbidity) 05/16/2020   Primary osteoarthritis of left knee 07/04/2018   Uterine fibroid 06/09/2017   Endometrial stromal nodule 06/09/2017    ONSET DATE: 08/19/2023 (referral)  REFERRING DIAG: R26.9 (ICD-10-CM) - Gait disorder  THERAPY DIAG:  Other abnormalities of gait and mobility  Muscle weakness (generalized)  Chronic pain of left knee  Rationale for Evaluation and Treatment: Rehabilitation  SUBJECTIVE:                                                                                                                                                                                              SUBJECTIVE STATEMENT: Pt presents w/[facility]. States she is feeling pretty good today, has been walking a lot. Taking her HEP easy but no complaints thus far. No falls   Pt accompanied by: self  PERTINENT HISTORY: anxiety, GERD, HTN, PTSD, sciatica, seizures  PAIN:  Are you having pain? Yes: NPRS scale: 6/10 Pain location: L knee, L middle digits, R thumb and first digit Pain description: Achy/throbbing  Aggravating factors: None Relieving  factors: Oxycodone , Voltaren    PRECAUTIONS: Fall  RED FLAGS: None   WEIGHT BEARING RESTRICTIONS: No  FALLS: Has patient fallen in last 6 months? No  LIVING ENVIRONMENT: Lives with: lives with their daughter Lives in: House/apartment Stairs: No Has following equipment at home: Counselling psychologist, Environmental consultant - 2 wheeled, Marine scientist  PLOF: Independent  PATIENT GOALS: to learn some things to build up my muscle mass and strengthen my legs so I can be balanced more   OBJECTIVE:  Note: Objective measures were completed at Evaluation unless otherwise noted.  DIAGNOSTIC FINDINGS: MRI of lumbar spine from 08/2020  IMPRESSION: 1. Degenerative changes of the lumbar spine with progressed anterolisthesis at L4-5 where there is mild-to-moderate spinal canal stenosis with narrowing of the bilateral subarticular zones, severe right and moderate left neural foraminal narrowing. 2. Moderate right and severe left neural foraminal narrowing at L5-S1, unchanged.  COGNITION: Overall cognitive status: Difficulty to assess due to: no family present   SENSATION: Pt reports occasional numbness in LLE and frequent tingling in bilateral hands    POSTURE: rounded shoulders, forward head, and posterior pelvic tilt  LOWER EXTREMITY ROM:     Active  Right Eval Left Eval  Hip flexion    Hip extension    Hip abduction    Hip adduction    Hip internal rotation    Hip external  rotation    Knee flexion    Knee extension  Lacking full ROM due to pain, not formally assessed  Ankle dorsiflexion    Ankle plantarflexion    Ankle inversion    Ankle eversion     (Blank rows = not tested)  LOWER EXTREMITY MMT:  Tested in seated position   MMT Right Eval Left Eval  Hip flexion 4- 4-  Hip extension    Hip abduction 3+ 3+  Hip adduction 3+ 3+  Hip internal rotation    Hip external rotation    Knee flexion 4+ 3+  Knee extension 4+ 3+  Ankle dorsiflexion 5 5  Ankle plantarflexion    Ankle inversion    Ankle eversion    (Blank rows = not tested)  BED MOBILITY:  Not tested Pt reports independence w/this   TRANSFERS: Sit to stand: Modified independence  Assistive device utilized: None     Stand to sit: Modified independence  Assistive device utilized: None      RAMP:  Not tested  CURB:  Not tested  STAIRS: Not tested GAIT: Gait pattern: step through pattern, decreased arm swing- Right, decreased arm swing- Left, decreased stride length, knee flexed in stance- Left, scissoring, antalgic, lateral hip instability, lateral lean- Left, decreased trunk rotation, trunk flexed, and wide BOS Distance walked: Various clinic distances  Assistive device utilized: Stroller and None Level of assistance: Modified independence Comments: Noted scissoring of BLEs this date (LLE >RLE). Pt ambulated into/out of session w/rollator but throughout session w/no AD at mod I level   VITALS  There were no vitals filed for this visit.  TREATMENT:    Ther Act  SciFit multi-peaks level 7.5 for 8 minutes using BUE/BLEs for neural priming for reciprocal movement, dynamic cardiovascular warmup and increased amplitude of stepping. Cued pt to maintain steps/min >65 throughout. Pt requested to stay on SciFit for more time, so added 8 more minutes (pt preference) on single peak mode  at level 8.0. RPE of 8/10 and pt rated pain in L knee as 7/10 following activity.  Leg presses for improved functional BLE strength, eccentric control and single leg stability:  Double leg: 10 reps at 70#. Increased difficulty w/eccentric control  Single leg: 12 reps per side at 40#.  RPE of 9.5 following activity  Sidelying clamshells w/green theraband around distal quads, x12 reps per side, for improved glute med strength and core stability. Added to HEP (see bolded below)    Pt rated pain as 10/10 in L knee following session    PATIENT EDUCATION: Education details: updates to HEP Person educated: Patient Education method: Explanation, Demonstration, and Handouts Education comprehension: verbalized understanding and needs further education  HOME EXERCISE PROGRAM: Access Code: [email or web address] URL: [email or web address] Date: 09/20/2023 Prepared by: Burleigh Carp Alycia Cooperwood  Exercises - Side Stepping with Resistance at Thighs and Counter Support  - 1 x daily - 7 x weekly - 3 sets - 10 reps - Forward Backward Monster Walk with Band at [employer] and Counter Support  - 1 x daily - 7 x weekly - 3 sets - 10 reps - Seated Knee Extension with Resistance  - 1 x daily - 7 x weekly - 3 sets - 10 reps - Sit to Stand Without Arm Support  - 1 x daily - 7 x weekly - 3 sets - 10 reps - Supine Bridge  - 1 x daily - 7 x weekly - 3 sets - 10 reps - Clam with Resistance  - 1 x daily - 7 x weekly - 2 sets - 12 reps  GOALS: Goals reviewed with patient? Yes  SHORT TERM GOALS: Target date: 10/10/2023   Pt will be independent with initial HEP for improved strength, balance, transfers and gait.  Baseline: Goal status: INITIAL  2.  FGA to be assessed and LTG updated  Baseline:  Goal status: INITIAL  3.  Pt will improve gait velocity to at least 2.5 ft/s w/LRAD for improved gait efficiency and independence   Baseline: 2.15 ft/s no AD Goal status: INITIAL  4.  Pt will improve 5 x STS to less than or  equal to 25 seconds to demonstrate improved functional strength and transfer efficiency.   Baseline: 30.79s  Goal status: INITIAL   LONG TERM GOALS: Target date: 10/24/2023   Pt will be independent with final HEP for improved strength, balance, transfers and gait.  Baseline:  Goal status: INITIAL  2.  FGA goal Baseline:  Goal status: INITIAL  3.  Pt will improve gait velocity to at least 2.8 ft/s w/LRAD for improved gait efficiency and stability   Baseline: 2.15 ft/s no AD Goal status: INITIAL  4.  Pt will improve 5 x STS to less than or equal to 20 seconds to demonstrate improved functional strength and transfer efficiency.   Baseline: 30.79s  Goal status: INITIAL   ASSESSMENT:  CLINICAL IMPRESSION: Emphasis of skilled PT session on functional BLE strength, eccentric quad control and endurance. Pt requested extra time on Scifit today due to enjoying activity, so worked on hill intervals for added challenge. Pt most challenged by leg presses and required min verbal cues for improved  eccentric control of quads. Pt reporting 10/10 pain in L knee at end of activity but did not act as though she was in pain, so unsure if pt meant to rate RPE. Continue POC.    OBJECTIVE IMPAIRMENTS: Abnormal gait, decreased activity tolerance, decreased balance, decreased cognition, decreased endurance, decreased knowledge of use of DME, decreased mobility, difficulty walking, decreased ROM, decreased strength, decreased safety awareness, impaired sensation, impaired UE functional use, improper body mechanics, postural dysfunction, and pain  ACTIVITY LIMITATIONS: carrying, lifting, bending, standing, squatting, stairs, locomotion level, and caring for others  PARTICIPATION LIMITATIONS: cleaning, interpersonal relationship, driving, shopping, community activity, occupation, and yard work  PERSONAL FACTORS: Behavior pattern, Fitness, Transportation, and 1-2 comorbidities: L knee OA and sciatica are  also affecting patient's functional outcome.   REHAB POTENTIAL: Good  CLINICAL DECISION MAKING: Evolving/moderate complexity  EVALUATION COMPLEXITY: Moderate  PLAN:  PT FREQUENCY: 1-2x/week  PT DURATION: 6 weeks  PLANNED INTERVENTIONS: 97164- PT Re-evaluation, 97750- Physical Performance Testing, 97110-Therapeutic exercises, 97530- Therapeutic activity, 97112- Neuromuscular re-education, 97535- Self Care, 13086- Manual therapy, [phone]- Gait training, [phone]- Aquatic Therapy, Patient/Family education, Balance training, Stair training, Dry Needling, Joint mobilization, Spinal mobilization, Vestibular training, and DME instructions  PLAN FOR NEXT SESSION: Assess FGA and update goal. Monitor vitals. Add to HEP prn. Single leg stability, eccentric quad strength, posterior chain strength    Mykela Mewborn E Amazing Cowman, PT, DPT 09/23/2023, 3:29 PM

## 2023-09-27 ENCOUNTER — Ambulatory Visit: Admitting: Physical Therapy

## 2023-09-27 DIAGNOSIS — G8929 Other chronic pain: Secondary | ICD-10-CM

## 2023-09-27 DIAGNOSIS — R2689 Other abnormalities of gait and mobility: Secondary | ICD-10-CM

## 2023-09-27 DIAGNOSIS — M6281 Muscle weakness (generalized): Secondary | ICD-10-CM
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
## 2023-09-29 NOTE — [facility]reg[facility] Nurse Program (Signed)
 Individual time with member and went over women's health issues.  Discussion of need for pap smear and mammogram.  She has made appointment to behavioral health either.  Encouraged to do same.  Gave resources for testing.Dillard Frame, RN, CCNP, [phone]

## 2023-09-30 ENCOUNTER — Encounter: Payer: Self-pay | Admitting: Physical Therapy

## 2023-09-30 ENCOUNTER — Ambulatory Visit: Admitting: Physical Therapy

## 2023-09-30 DIAGNOSIS — M6281 Muscle weakness (generalized): Secondary | ICD-10-CM

## 2023-09-30 DIAGNOSIS — R2689 Other abnormalities of gait and mobility: Secondary | ICD-10-CM

## 2023-09-30 DIAGNOSIS — G8929 Other chronic pain: Secondary | ICD-10-CM
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
## 2023-10-04 ENCOUNTER — Ambulatory Visit: Admitting: Physical Therapy

## 2023-10-04 DIAGNOSIS — R2689 Other abnormalities of gait and mobility: Secondary | ICD-10-CM | POA: Diagnosis not present

## 2023-10-04 DIAGNOSIS — M6281 Muscle weakness (generalized): Secondary | ICD-10-CM

## 2023-10-04 DIAGNOSIS — G8929 Other chronic pain: Secondary | ICD-10-CM

## 2023-10-04 NOTE — Therapy (Signed)
 OUTPATIENT PHYSICAL THERAPY NEURO TREATMENT   Patient Name: Margaret Owens MRN: [id number] DOB:01-10-1971, 53 y.o., female 71 Date: 10/04/2023   PCP: Shelda Atlas, MD REFERRING PROVIDER: Carilyn Prentice FORBES, MD  END OF SESSION:  PT End of Session - 10/04/23 1451     Visit Number 6    Number of Visits 13    Date for PT Re-Evaluation 10/28/23    Authorization Type [place] Medicaid    PT Start Time 1448    PT Stop Time 1530    PT Time Calculation (min) 42 min    Equipment Utilized During Treatment --    Activity Tolerance Patient tolerated treatment well    Behavior During Therapy WFL for tasks assessed/performed            Past Medical History:  Diagnosis Date   Anxiety    Arthritis    Asthma    Bursitis    Depression    GERD (gastroesophageal reflux disease)    Hypertension    Muscle spasms of both lower extremities    PTSD (post-traumatic stress disorder)    Sciatica    Seizures (HCC)    Uterine fibroid    Past Surgical History:  Procedure Laterality Date   colonoscopy     Patient Active Problem List   Diagnosis Date Noted   Gait disorder 08/19/2023   Positive ANA (antinuclear antibody) 12/03/2021   Urticaria 12/03/2021   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 12/03/2021   Spinal stenosis of lumbar region with radiculopathy 01/23/2021   COVID-19 vaccination declined 05/16/2020   Prediabetes 05/16/2020   Obesity (BMI 35.0-39.9 without comorbidity) 05/16/2020   Primary osteoarthritis of left knee 07/04/2018   Uterine fibroid 06/09/2017   Endometrial stromal nodule 06/09/2017    ONSET DATE: 08/19/2023 (referral)  REFERRING DIAG: R26.9 (ICD-10-CM) - Gait disorder  THERAPY DIAG:  Other abnormalities of gait and mobility  Muscle weakness (generalized)  Chronic pain of left knee  Rationale for Evaluation and Treatment: Rehabilitation  SUBJECTIVE:                                                                                                                                                                                              SUBJECTIVE STATEMENT: Pt presents w/stroller. Reports she enjoyed aquatic therapy but felt it the day after. Rating knee pain as an 8/10 currently. Wants to ride the bike and work on balance today.    Pt accompanied by: self  PERTINENT HISTORY: anxiety, GERD, HTN, PTSD, sciatica, seizures  PAIN:  Are you having pain? Yes: NPRS scale: 8/10 Pain location: L knee Pain description:  Achy/throbbing  Aggravating factors: None Relieving factors: Oxycodone , Voltaren    PRECAUTIONS: Fall  RED FLAGS: None   WEIGHT BEARING RESTRICTIONS: No  FALLS: Has patient fallen in last 6 months? No  LIVING ENVIRONMENT: Lives with: lives with their daughter Lives in: House/apartment Stairs: No Has following equipment at home: Counselling psychologist, Environmental consultant - 2 wheeled, Marine scientist  PLOF: Independent  PATIENT GOALS: to learn some things to build up my muscle mass and strengthen my legs so I can be balanced more   OBJECTIVE:  Note: Objective measures were completed at Evaluation unless otherwise noted.  DIAGNOSTIC FINDINGS: MRI of lumbar spine from 08/2020  IMPRESSION: 1. Degenerative changes of the lumbar spine with progressed anterolisthesis at L4-5 where there is mild-to-moderate spinal canal stenosis with narrowing of the bilateral subarticular zones, severe right and moderate left neural foraminal narrowing. 2. Moderate right and severe left neural foraminal narrowing at L5-S1, unchanged.  COGNITION: Overall cognitive status: Difficulty to assess due to: no family present   SENSATION: Pt reports occasional numbness in LLE and frequent tingling in bilateral hands    POSTURE: rounded shoulders, forward head, and posterior pelvic tilt  LOWER EXTREMITY ROM:     Active  Right Eval Left Eval  Hip flexion    Hip extension    Hip abduction    Hip adduction    Hip  internal rotation    Hip external rotation    Knee flexion    Knee extension  Lacking full ROM due to pain, not formally assessed  Ankle dorsiflexion    Ankle plantarflexion    Ankle inversion    Ankle eversion     (Blank rows = not tested)  LOWER EXTREMITY MMT:  Tested in seated position   MMT Right Eval Left Eval  Hip flexion 4- 4-  Hip extension    Hip abduction 3+ 3+  Hip adduction 3+ 3+  Hip internal rotation    Hip external rotation    Knee flexion 4+ 3+  Knee extension 4+ 3+  Ankle dorsiflexion 5 5  Ankle plantarflexion    Ankle inversion    Ankle eversion    (Blank rows = not tested)  BED MOBILITY:  Not tested Pt reports independence w/this   TRANSFERS: Sit to stand: Modified independence  Assistive device utilized: None     Stand to sit: Modified independence  Assistive device utilized: None      RAMP:  Not tested  CURB:  Not tested  STAIRS: Not tested GAIT: Gait pattern: step through pattern, decreased arm swing- Right, decreased arm swing- Left, decreased stride length, knee flexed in stance- Left, scissoring, antalgic, lateral hip instability, lateral lean- Left, decreased trunk rotation, trunk flexed, and wide BOS Distance walked: Various clinic distances  Assistive device utilized: Stroller and None Level of assistance: Modified independence Comments: Noted scissoring of BLEs this date (LLE >RLE). Pt ambulated into/out of session w/rollator but throughout session w/no AD at mod I level   VITALS  There were no vitals filed for this visit.  TREATMENT:    Ther Act  SciFit multi-peaks level 8.5 for 10 minutes using BUE/BLEs for neural priming for reciprocal movement, dynamic cardiovascular warmup and increased amplitude of stepping. RPE of 10/10 following activity  In // bars for improved ankle strategy, single leg stability and functional hip  strength:  On rocker board in A/P direction:  Alt retro step w/no UE support, x12 reps per side Alt fwd step w/intermittent UE support, x12 reps per side. More challenging compared to retro step.  W/red theraband around ankles:  Alt fwd toe taps to 6 step using dots as visual target to facilitate bilateral hip abduction, x10 reps per side. Pt required BUE support to perform. Decreased eccentric control of LLE >RLE. Added to HEP (see bolded below)  Lateral toe taps to 6 step, x15 reps per side w/BUE support. Continued difficulty on LLE  RPE of 10/10 following activity  Quadruped on mat table w/airex under knees, modified bird dogs (moving BLEs only) x8 reps per side for improved posterior chain strength and proximal stability. Pt more challenged moving RLE due to decreased stability on LLE. Added to HEP (see bolded below).    PATIENT EDUCATION: Education details: Updates to HEP Person educated: Patient Education method: Explanation, Demonstration, and Handouts Education comprehension: verbalized understanding and needs further education  HOME EXERCISE PROGRAM: Access Code: [email or web address] URL: [email or web address] Date: 09/20/2023 Prepared by: Marlon Jawanda Passey  Exercises - Side Stepping with Resistance at Thighs and Counter Support  - 1 x daily - 7 x weekly - 3 sets - 10 reps - Forward Backward Monster Walk with Band at [employer] and Counter Support  - 1 x daily - 7 x weekly - 3 sets - 10 reps - Seated Knee Extension with Resistance  - 1 x daily - 7 x weekly - 3 sets - 10 reps - Sit to Stand Without Arm Support  - 1 x daily - 7 x weekly - 3 sets - 10 reps - Supine Bridge  - 1 x daily - 7 x weekly - 3 sets - 10 reps - Clam with Resistance  - 1 x daily - 7 x weekly - 2 sets - 12 reps - Hip Extension with Resistance Loop  - 1 x daily - 7 x weekly - 3 sets - 10 reps - Bird Dog  - 1 x daily - 7 x weekly - 3 sets - 10 reps - Marching with Resistance  - 1 x daily - 7 x weekly - 3 sets -  10 reps  GOALS: Goals reviewed with patient? Yes  SHORT TERM GOALS: Target date: 10/10/2023   Pt will be independent with initial HEP for improved strength, balance, transfers and gait.  Baseline: Goal status: INITIAL  2.  FGA to be assessed and LTG updated  Baseline:  Goal status: INITIAL  3.  Pt will improve gait velocity to at least 2.5 ft/s w/LRAD for improved gait efficiency and independence   Baseline: 2.15 ft/s no AD Goal status: INITIAL  4.  Pt will improve 5 x STS to less than or equal to 25 seconds to demonstrate improved functional strength and transfer efficiency.   Baseline: 30.79s  Goal status: INITIAL   LONG TERM GOALS: Target date: 10/24/2023   Pt will be independent with final HEP for improved strength, balance, transfers and gait.  Baseline:  Goal status: INITIAL  2.  FGA goal Baseline:  Goal status: INITIAL  3.  Pt will improve gait velocity to at least 2.8 ft/s w/LRAD for  improved gait efficiency and stability   Baseline: 2.15 ft/s no AD Goal status: INITIAL  4.  Pt will improve 5 x STS to less than or equal to 20 seconds to demonstrate improved functional strength and transfer efficiency.   Baseline: 30.79s  Goal status: INITIAL   ASSESSMENT:  CLINICAL IMPRESSION: Emphasis of skilled PT session on functional hip strength, single leg stability and proximal stability. Pt reporting higher pain levels in L knee today but still tolerated session well. Pt most challenged by proximal stability tasks and single leg stability on LLE, so added exercises to HEP to address this. Continue per POC.    OBJECTIVE IMPAIRMENTS: Abnormal gait, decreased activity tolerance, decreased balance, decreased cognition, decreased endurance, decreased knowledge of use of DME, decreased mobility, difficulty walking, decreased ROM, decreased strength, decreased safety awareness, impaired sensation, impaired UE functional use, improper body mechanics, postural  dysfunction, and pain  ACTIVITY LIMITATIONS: carrying, lifting, bending, standing, squatting, stairs, locomotion level, and caring for others  PARTICIPATION LIMITATIONS: cleaning, interpersonal relationship, driving, shopping, community activity, occupation, and yard work  PERSONAL FACTORS: Behavior pattern, Fitness, Transportation, and 1-2 comorbidities: L knee OA and sciatica are also affecting patient's functional outcome.   REHAB POTENTIAL: Good  CLINICAL DECISION MAKING: Evolving/moderate complexity  EVALUATION COMPLEXITY: Moderate  PLAN:  PT FREQUENCY: 1-2x/week  PT DURATION: 6 weeks  PLANNED INTERVENTIONS: 97164- PT Re-evaluation, 97750- Physical Performance Testing, 97110-Therapeutic exercises, 97530- Therapeutic activity, 97112- Neuromuscular re-education, 97535- Self Care, 02859- Manual therapy, [phone]- Gait training, [phone]- Aquatic Therapy, Patient/Family education, Balance training, Stair training, Dry Needling, Joint mobilization, Spinal mobilization, Vestibular training, and DME instructions  PLAN FOR NEXT SESSION: Assess FGA and update goal. Monitor vitals. Add to HEP prn. Single leg stability, eccentric quad strength, posterior chain strength   Follow-up about aquatic HEP - access to Y?  Estell Dillinger E Torrin Crihfield, PT, DPT 10/04/2023, 3:37 PM

## 2023-10-07 ENCOUNTER — Ambulatory Visit: Admitting: Physical Therapy

## 2023-10-07 ENCOUNTER — Encounter: Payer: Self-pay | Admitting: Physical Medicine & Rehabilitation

## 2023-10-07 ENCOUNTER — Encounter: Attending: Physical Medicine & Rehabilitation | Admitting: Physical Medicine & Rehabilitation

## 2023-10-07 VITALS — BP 117/68 | HR 75 | Ht 69.0 in | Wt 202.8 lb

## 2023-10-07 DIAGNOSIS — R202 Paresthesia of skin: Secondary | ICD-10-CM | POA: Insufficient documentation

## 2023-10-07 DIAGNOSIS — R2689 Other abnormalities of gait and mobility: Secondary | ICD-10-CM

## 2023-10-07 DIAGNOSIS — M6281 Muscle weakness (generalized): Secondary | ICD-10-CM

## 2023-10-07 DIAGNOSIS — R200 Anesthesia of skin: Secondary | ICD-10-CM | POA: Insufficient documentation

## 2023-10-07 DIAGNOSIS — G8929 Other chronic pain: Secondary | ICD-10-CM

## 2023-10-07 NOTE — Therapy (Addendum)
 OUTPATIENT PHYSICAL THERAPY NEURO TREATMENT   Patient Name: Margaret Owens MRN: [id number] DOB:[birth year], 53 y.o., female 28 Date: 10/07/2023   PCP: Shelda Atlas, MD REFERRING PROVIDER: Carilyn Prentice FORBES, MD  END OF SESSION:  PT End of Session - 10/07/23 1234     Visit Number 7    Number of Visits 13    Date for PT Re-Evaluation 10/28/23    Authorization Type [place] [employer]    PT Start Time 1233    PT Stop Time 1313    PT Time Calculation (min) 40 min    Activity Tolerance Patient limited by pain    Behavior During Therapy [facility] for tasks assessed/performed             Past Medical History:  Diagnosis Date   Anxiety    Arthritis    Asthma    Bursitis    Depression    GERD (gastroesophageal reflux disease)    Hypertension    Muscle spasms of both lower extremities    PTSD (post-traumatic stress disorder)    Sciatica    Seizures (HCC)    Uterine fibroid    Past Surgical History:  Procedure Laterality Date   colonoscopy     Patient Active Problem List   Diagnosis Date Noted   Gait disorder 08/19/2023   Positive ANA (antinuclear antibody) 12/03/2021   Urticaria 12/03/2021   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 12/03/2021   Spinal stenosis of lumbar region with radiculopathy 01/23/2021   COVID-19 vaccination declined 05/16/2020   Prediabetes 05/16/2020   Obesity (BMI 35.0-39.9 without comorbidity) 05/16/2020   Primary osteoarthritis of left knee 07/04/2018   Uterine fibroid 06/09/2017   Endometrial stromal nodule 06/09/2017    ONSET DATE: 08/19/2023 (referral)  REFERRING DIAG: R26.9 (ICD-10-CM) - Gait disorder  THERAPY DIAG:  Other abnormalities of gait and mobility  Muscle weakness (generalized)  Chronic pain of left knee  Rationale for Evaluation and Treatment: Rehabilitation  SUBJECTIVE:                                                                                                                                                                                              SUBJECTIVE STATEMENT: Pt presents without AD. States she flared her L knee pain up while cleaning her apartment. Was sore after last session but felt okay. No falls. HEP is going well, I probably don't do as many as I should.    Pt accompanied by: self  PERTINENT HISTORY: anxiety, GERD, HTN, PTSD, sciatica, seizures  PAIN:  Are you having pain? Yes: NPRS scale: 10.5/10 Pain location: L knee Pain  description: Achy/throbbing  Aggravating factors: None Relieving factors: Oxycodone , Voltaren    PRECAUTIONS: Fall  RED FLAGS: None   WEIGHT BEARING RESTRICTIONS: No  FALLS: Has patient fallen in last 6 months? No  LIVING ENVIRONMENT: Lives with: lives with their daughter Lives in: House/apartment Stairs: No Has following equipment at home: Counselling psychologist, Environmental consultant - 2 wheeled, Marine scientist  PLOF: Independent  PATIENT GOALS: to learn some things to build up my muscle mass and strengthen my legs so I can be balanced more   OBJECTIVE:  Note: Objective measures were completed at Evaluation unless otherwise noted.  DIAGNOSTIC FINDINGS: MRI of lumbar spine from 08/2020  IMPRESSION: 1. Degenerative changes of the lumbar spine with progressed anterolisthesis at L4-5 where there is mild-to-moderate spinal canal stenosis with narrowing of the bilateral subarticular zones, severe right and moderate left neural foraminal narrowing. 2. Moderate right and severe left neural foraminal narrowing at L5-S1, unchanged.  COGNITION: Overall cognitive status: Difficulty to assess due to: no family present   SENSATION: Pt reports occasional numbness in LLE and frequent tingling in bilateral hands    POSTURE: rounded shoulders, forward head, and posterior pelvic tilt  LOWER EXTREMITY ROM:     Active  Right Eval Left Eval  Hip flexion    Hip extension    Hip abduction    Hip adduction    Hip internal rotation     Hip external rotation    Knee flexion    Knee extension  Lacking full ROM due to pain, not formally assessed  Ankle dorsiflexion    Ankle plantarflexion    Ankle inversion    Ankle eversion     (Blank rows = not tested)  LOWER EXTREMITY MMT:  Tested in seated position   MMT Right Eval Left Eval  Hip flexion 4- 4-  Hip extension    Hip abduction 3+ 3+  Hip adduction 3+ 3+  Hip internal rotation    Hip external rotation    Knee flexion 4+ 3+  Knee extension 4+ 3+  Ankle dorsiflexion 5 5  Ankle plantarflexion    Ankle inversion    Ankle eversion    (Blank rows = not tested)  BED MOBILITY:  Not tested Pt reports independence w/this   TRANSFERS: Sit to stand: Modified independence  Assistive device utilized: None     Stand to sit: Modified independence  Assistive device utilized: None      RAMP:  Not tested  CURB:  Not tested  STAIRS: Not tested GAIT: Gait pattern: step through pattern, decreased arm swing- Right, decreased arm swing- Left, decreased stride length, knee flexed in stance- Left, scissoring, antalgic, lateral hip instability, lateral lean- Left, decreased trunk rotation, trunk flexed, and wide BOS Distance walked: Various clinic distances  Assistive device utilized: Stroller and None Level of assistance: Modified independence Comments: Noted scissoring of BLEs this date (LLE >RLE). Pt ambulated into/out of session w/rollator but throughout session w/no AD at mod I level   VITALS  There were no vitals filed for this visit.  TREATMENT:    Ther Act  SciFit multi-peaks level 8.5 for 10 minutes using BUE/BLEs for neural priming for reciprocal movement, dynamic cardiovascular warmup and increased amplitude of stepping. Pt denied fatigue but reported 10/10 pain at end of activity.  On blue theraball for improved lateral weight shifting, core stability  and BUE strength (pt request): Alt marches, x10 reps per side. Min cues for improved eccentric control and increased amplitude of marches, but pt reports she cannot perform today. Alt OH presses w/3# DB, x10 reps per side  Alt punches w/3# DB, x10 reps per side.  D1 shoulder flexion/extension holding 4# med ball, 2x12 reps per side  Ball volley w/2# weighted dowel, x5 minutes  Seated resisted hip abduction w/green theraband, x20 reps.   Self-care/home management  Reviewed knee X-ray from 02/2023 and informed pt of mild OA as she tends to refer to her L knee as bone on bone. Pt's L knee quite swollen today so inquired about history of knee injury via twisting or fall. Pt cannot recall a knee injury, inquiring if she could have injured it in her sleep. Informed pt this is unlikely and provided brief education on anatomy on knee joint and supporting ligaments. Pt reports she will ask Dr. Carilyn about her knee today.     PATIENT EDUCATION: Education details: Continue HEP  Person educated: Patient Education method: Explanation Education comprehension: verbalized understanding and needs further education  HOME EXERCISE PROGRAM: Access Code: [email or web address] URL: [email or web address] Date: 09/20/2023 Prepared by: Marlon Keegan Bensch  Exercises - Side Stepping with Resistance at Thighs and Counter Support  - 1 x daily - 7 x weekly - 3 sets - 10 reps - Forward Backward Monster Walk with Band at [employer] and Counter Support  - 1 x daily - 7 x weekly - 3 sets - 10 reps - Seated Knee Extension with Resistance  - 1 x daily - 7 x weekly - 3 sets - 10 reps - Sit to Stand Without Arm Support  - 1 x daily - 7 x weekly - 3 sets - 10 reps - Supine Bridge  - 1 x daily - 7 x weekly - 3 sets - 10 reps - Clam with Resistance  - 1 x daily - 7 x weekly - 2 sets - 12 reps - Hip Extension with Resistance Loop  - 1 x daily - 7 x weekly - 3 sets - 10 reps - Bird Dog  - 1 x daily - 7 x weekly - 3 sets - 10  reps - Marching with Resistance  - 1 x daily - 7 x weekly - 3 sets - 10 reps  GOALS: Goals reviewed with patient? Yes  SHORT TERM GOALS: Target date: 10/10/2023   Pt will be independent with initial HEP for improved strength, balance, transfers and gait.  Baseline: Goal status: INITIAL  2.  FGA to be assessed and LTG updated  Baseline:  Goal status: INITIAL  3.  Pt will improve gait velocity to at least 2.5 ft/s w/LRAD for improved gait efficiency and independence   Baseline: 2.15 ft/s no AD Goal status: INITIAL  4.  Pt will improve 5 x STS to less than or equal to 25 seconds to demonstrate improved functional strength and transfer efficiency.   Baseline: 30.79s  Goal status: INITIAL   LONG TERM GOALS: Target date: 10/24/2023   Pt will be independent with final HEP for improved strength, balance, transfers and gait.  Baseline:  Goal status: INITIAL  2.  FGA  goal Baseline:  Goal status: INITIAL  3.  Pt will improve gait velocity to at least 2.8 ft/s w/LRAD for improved gait efficiency and stability   Baseline: 2.15 ft/s no AD Goal status: INITIAL  4.  Pt will improve 5 x STS to less than or equal to 20 seconds to demonstrate improved functional strength and transfer efficiency.   Baseline: 30.79s  Goal status: INITIAL   ASSESSMENT:  CLINICAL IMPRESSION: Session limited as pt's L knee is very swollen and pt reporting high pain levels. Emphasis of skilled PT session on core stability, endurance and functional BUE strength. Pt tolerated session well but did have increased edema in L knee today. Reviewed most recent X-ray of L knee w/pt and provided education on anatomy of knee and common causes of swelling, but encouraged pt to speak to Dr. Carilyn about this. Continue per POC.    OBJECTIVE IMPAIRMENTS: Abnormal gait, decreased activity tolerance, decreased balance, decreased cognition, decreased endurance, decreased knowledge of use of DME, decreased mobility,  difficulty walking, decreased ROM, decreased strength, decreased safety awareness, impaired sensation, impaired UE functional use, improper body mechanics, postural dysfunction, and pain  ACTIVITY LIMITATIONS: carrying, lifting, bending, standing, squatting, stairs, locomotion level, and caring for others  PARTICIPATION LIMITATIONS: cleaning, interpersonal relationship, driving, shopping, community activity, occupation, and yard work  PERSONAL FACTORS: Behavior pattern, Fitness, Transportation, and 1-2 comorbidities: L knee OA and sciatica are also affecting patient's functional outcome.   REHAB POTENTIAL: Good  CLINICAL DECISION MAKING: Evolving/moderate complexity  EVALUATION COMPLEXITY: Moderate  PLAN:  PT FREQUENCY: 1-2x/week  PT DURATION: 6 weeks  PLANNED INTERVENTIONS: 97164- PT Re-evaluation, 97750- Physical Performance Testing, 97110-Therapeutic exercises, 97530- Therapeutic activity, [id number]- Neuromuscular re-education, 97535- Self Care, [id number]- Manual therapy, [phone]- Gait training, [phone]- Aquatic Therapy, Patient/Family education, Balance training, Stair training, Dry Needling, Joint mobilization, Spinal mobilization, Vestibular training, and DME instructions  PLAN FOR NEXT SESSION: Goals. Monitor vitals. Add to HEP prn. Single leg stability, eccentric quad strength, posterior chain strength   Follow-up about aquatic HEP - access to Y?  Sibbie Flammia E Gwen Sarvis, PT, DPT 10/07/2023, 1:13 PM

## 2023-10-07 NOTE — Patient Instructions (Signed)
Electromyoneurogram Electromyoneurogram is a test to check how well your muscles and nerves are working. This procedure includes the combined use of electromyogram (EMG) and nerve conduction study (NCS). EMG is used to evaluate muscles and the nerves that control those muscles. NCS, which is also called electroneurogram, measures how well your nerves conduct electricity. The procedures should be done together to check if your muscles and nerves are healthy. If the results of the tests are abnormal, this may indicate disease or injury, such as a neuromuscular disease or peripheral nerve damage. Tell a health care provider about: Any allergies you have. All medicines you are taking, including vitamins, herbs, eye drops, creams, and over-the-counter medicines. Any bleeding problems you have. Any surgeries you have had. Any medical conditions you have. What are the risks? Generally, this is a safe procedure. However, problems may occur, including: Bleeding or bruising. Infection where the electrodes were inserted. What happens before the test? Medicines Take all of your usually prescribed medications before this testing is performed. Do not stop your blood thinners unless advised by your prescribing physician. General instructions Your health care provider may ask you to warm the limb that will be checked with warm water, hot pack, or wrapping the limb in a blanket. Do not use lotions or creams on the same day that you will be having the procedure. What happens during the test? For EMG  Your health care provider will ask you to stay in a position so that the muscle being studied can be accessed. You will be sitting or lying down. You may be given a medicine to numb the area (local anesthetic) and the skin will be disinfected. A very thin needle that has an electrode will be inserted into your muscle, one muscle at a time. Typically, multiple muscles are evaluated during a single study. Another  small electrode will be placed on your skin near the muscle. Your health care provider will ask you to continue to remain still. The electrodes will record the electrical activity of your muscles. You may see this on a monitor or hear it in the room. After your muscles have been studied at rest, your health care provider will ask you to contract or flex your muscles. The electrodes will record the electrical activity of your muscles. Your health care provider will remove the electrodes and the electrode needle when the procedure is finished. The procedure may vary among health care providers and hospitals. For NCS  An electrode that records your nerve activity (recording electrode) will be placed on your skin by the muscle that is being studied. An electrode that is used as a reference (reference electrode) will be placed near the recording electrode. A paste or gel will be applied to your skin between the recording electrode and the reference electrode. Your nerve will be stimulated with a mild shock. The speed of the nerves and strength of response is recorded by the electrodes. Your health care provider will remove the electrodes and the gel when the procedure is finished. The procedure may vary among health care providers and hospitals. What can I expect after the test? It is up to you to get your test results. Ask your health care provider, or the department that is doing the test, when your results will be ready. Your health care provider may: Give you medicines for any pain. Monitor the insertion sites to make sure that bleeding stops. You should be able to drive yourself to and from the test. Discomfort can  persist for a few hours after the test, but should be better the next day. Contact a health care provider if: You have swelling, redness, or drainage at any of the insertion sites. Summary Electromyoneurogram is a test to check how well your muscles and nerves are working. If the  results of the tests are abnormal, this may indicate disease or injury. This is a safe procedure. However, problems may occur, such as bleeding and infection. Your health care provider will do two tests to complete this procedure. One checks your muscles (EMG) and another checks your nerves (NCS). It is up to you to get your test results. Ask your health care provider, or the department that is doing the test, when your results will be ready. This information is not intended to replace advice given to you by your health care provider. Make sure you discuss any questions you have with your health care provider. Document Revised: 12/11/2020 Document Reviewed: 11/10/2020 Elsevier Patient Education  2024 [employer].

## 2023-10-08 ENCOUNTER — Encounter: Payer: Self-pay | Admitting: Physical Medicine & Rehabilitation

## 2023-10-08 NOTE — Progress Notes (Signed)
 EMG/NCV bilateral upper extremities please refer to report filed under media tab.  It may take up to 7 days to scan report after it has been completed.

## 2023-10-11 ENCOUNTER — Ambulatory Visit: Admitting: Physical Therapy

## 2023-10-11 DIAGNOSIS — M6281 Muscle weakness (generalized): Secondary | ICD-10-CM

## 2023-10-11 DIAGNOSIS — R2689 Other abnormalities of gait and mobility: Secondary | ICD-10-CM | POA: Diagnosis not present

## 2023-10-11 DIAGNOSIS — G8929 Other chronic pain: Secondary | ICD-10-CM

## 2023-10-11 NOTE — Therapy (Signed)
 OUTPATIENT PHYSICAL THERAPY NEURO TREATMENT   Patient Name: Margaret Owens MRN: [id number] DOB:07-03-1970, 53 y.o., female Today's Date: 10/11/2023   PCP: Shelda Atlas, MD REFERRING PROVIDER: Carilyn Prentice FORBES, MD  END OF SESSION:  PT End of Session - 10/11/23 1214     Visit Number 8    Number of Visits 13    Date for PT Re-Evaluation 10/28/23    Authorization Type [place] [employer]    PT Start Time 1213    PT Stop Time 1255    PT Time Calculation (min) 42 min    Equipment Utilized During Treatment Gait belt    Activity Tolerance Patient tolerated treatment well    Behavior During Therapy WFL for tasks assessed/performed              Past Medical History:  Diagnosis Date   Anxiety    Arthritis    Asthma    Bursitis    Depression    GERD (gastroesophageal reflux disease)    Hypertension    Muscle spasms of both lower extremities    PTSD (post-traumatic stress disorder)    Sciatica    Seizures (HCC)    Uterine fibroid    Past Surgical History:  Procedure Laterality Date   colonoscopy     Patient Active Problem List   Diagnosis Date Noted   Gait disorder 08/19/2023   Positive ANA (antinuclear antibody) 12/03/2021   Urticaria 12/03/2021   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 12/03/2021   Spinal stenosis of lumbar region with radiculopathy 01/23/2021   COVID-19 vaccination declined 05/16/2020   Prediabetes 05/16/2020   Obesity (BMI 35.0-39.9 without comorbidity) 05/16/2020   Primary osteoarthritis of left knee 07/04/2018   Uterine fibroid 06/09/2017   Endometrial stromal nodule 06/09/2017    ONSET DATE: 08/19/2023 (referral)  REFERRING DIAG: R26.9 (ICD-10-CM) - Gait disorder  THERAPY DIAG:  Other abnormalities of gait and mobility  Muscle weakness (generalized)  Chronic pain of left knee  Rationale for Evaluation and Treatment: Rehabilitation  SUBJECTIVE:                                                                                                                                                                                              SUBJECTIVE STATEMENT: Pt presents with stroller. Reports her knee is feeling better today but is still swollen. Had EMG performed on her hands, that thing hurt. No falls    Pt accompanied by: self  PERTINENT HISTORY: anxiety, GERD, HTN, PTSD, sciatica, seizures  PAIN:  Are you having pain? Yes: NPRS scale: 5/10 Pain location: L knee Pain description: Achy/throbbing  Aggravating factors: None Relieving factors: Oxycodone , Voltaren    PRECAUTIONS: Fall  RED FLAGS: None   WEIGHT BEARING RESTRICTIONS: No  FALLS: Has patient fallen in last 6 months? No  LIVING ENVIRONMENT: Lives with: lives with their daughter Lives in: House/apartment Stairs: No Has following equipment at home: Counselling psychologist, Environmental consultant - 2 wheeled, Marine scientist  PLOF: Independent  PATIENT GOALS: to learn some things to build up my muscle mass and strengthen my legs so I can be balanced more   OBJECTIVE:  Note: Objective measures were completed at Evaluation unless otherwise noted.  DIAGNOSTIC FINDINGS: MRI of lumbar spine from 08/2020  IMPRESSION: 1. Degenerative changes of the lumbar spine with progressed anterolisthesis at L4-5 where there is mild-to-moderate spinal canal stenosis with narrowing of the bilateral subarticular zones, severe right and moderate left neural foraminal narrowing. 2. Moderate right and severe left neural foraminal narrowing at L5-S1, unchanged.  COGNITION: Overall cognitive status: Difficulty to assess due to: no family present   SENSATION: Pt reports occasional numbness in LLE and frequent tingling in bilateral hands    POSTURE: rounded shoulders, forward head, and posterior pelvic tilt  LOWER EXTREMITY ROM:     Active  Right Eval Left Eval  Hip flexion    Hip extension    Hip abduction    Hip adduction    Hip internal rotation     Hip external rotation    Knee flexion    Knee extension  Lacking full ROM due to pain, not formally assessed  Ankle dorsiflexion    Ankle plantarflexion    Ankle inversion    Ankle eversion     (Blank rows = not tested)  LOWER EXTREMITY MMT:  Tested in seated position   MMT Right Eval Left Eval  Hip flexion 4- 4-  Hip extension    Hip abduction 3+ 3+  Hip adduction 3+ 3+  Hip internal rotation    Hip external rotation    Knee flexion 4+ 3+  Knee extension 4+ 3+  Ankle dorsiflexion 5 5  Ankle plantarflexion    Ankle inversion    Ankle eversion    (Blank rows = not tested)  BED MOBILITY:  Not tested Pt reports independence w/this   TRANSFERS: Sit to stand: Modified independence  Assistive device utilized: None     Stand to sit: Modified independence  Assistive device utilized: None      RAMP:  Not tested  CURB:  Not tested  STAIRS: Not tested GAIT: Gait pattern: step through pattern, decreased arm swing- Right, decreased arm swing- Left, decreased stride length, knee flexed in stance- Left, scissoring, antalgic, lateral hip instability, lateral lean- Left, decreased trunk rotation, trunk flexed, and wide BOS Distance walked: Various clinic distances  Assistive device utilized: Stroller and None Level of assistance: Modified independence Comments: Noted scissoring of BLEs this date (LLE >RLE). Pt ambulated into/out of session w/rollator but throughout session w/no AD at mod I level   VITALS  There were no vitals filed for this visit.  TREATMENT:    Ther Act  SciFit multi-peaks level 9.0 for 10 minutes using BUE/BLEs for neural priming for reciprocal movement, dynamic cardiovascular warmup and increased amplitude of stepping. RPE of 6.5/10 following activity   Physical Performance- STG Assessment   [facility] PT Assessment - 10/11/23 1228       Transfers    Five time sit to stand comments  22.34s   hands on knees, lack of full hip extension but proper foot placement.     Ambulation/Gait   Gait velocity 32.8' over 13.63s = 2.41 ft/s no AD      Functional Gait  Assessment   Gait assessed  Yes    Gait Level Surface Walks 20 ft, slow speed, abnormal gait pattern, evidence for imbalance or deviates 10-15 in outside of the 12 in walkway width. Requires more than 7 sec to ambulate 20 ft.   9.28s   Change in Gait Speed Able to change speed, demonstrates mild gait deviations, deviates 6-10 in outside of the 12 in walkway width, or no gait deviations, unable to achieve a major change in velocity, or uses a change in velocity, or uses an assistive device.    Gait with Horizontal Head Turns Performs head turns smoothly with slight change in gait velocity (eg, minor disruption to smooth gait path), deviates 6-10 in outside 12 in walkway width, or uses an assistive device.    Gait with Vertical Head Turns Performs task with slight change in gait velocity (eg, minor disruption to smooth gait path), deviates 6 - 10 in outside 12 in walkway width or uses assistive device    Gait and Pivot Turn Pivot turns safely in greater than 3 sec and stops with no loss of balance, or pivot turns safely within 3 sec and stops with mild imbalance, requires small steps to catch balance.    Step Over Obstacle Is able to step over 2 stacked shoe boxes taped together (9 in total height) without changing gait speed. No evidence of imbalance.    Gait with Narrow Base of Support Is able to ambulate for 10 steps heel to toe with no staggering.    Gait with Eyes Closed Walks 20 ft, slow speed, abnormal gait pattern, evidence for imbalance, deviates 10-15 in outside 12 in walkway width. Requires more than 9 sec to ambulate 20 ft.   14.41s   Ambulating Backwards Walks 20 ft, slow speed, abnormal gait pattern, evidence for imbalance, deviates 10-15 in outside 12 in walkway width.   very slow and  cautious gait   Steps Two feet to a stair, must use rail.    Total Score 18    FGA comment: High fall risk         Ther Act (cont)  Alt retro heel tap from 6 step w/BUE support, x8 reps per side. Added to HEP (see bolded below) to work on confidence w/retro stepping, eccentric quad control and single leg stability.    PATIENT EDUCATION: Education details: Updates to HEP, goal results   Person educated: Patient Education method: Explanation, Demonstration, and Handouts Education comprehension: verbalized understanding, returned demonstration, and needs further education  HOME EXERCISE PROGRAM: Access Code: [email or web address] URL: [email or web address] Date: 09/20/2023 Prepared by: Marlon Gracyn Santillanes  Exercises - Side Stepping with Resistance at Thighs and Counter Support  - 1 x daily - 7 x weekly - 3 sets - 10 reps - Forward Backward Monster Walk with Band at [employer] and Counter Support  - 1 x daily - 7 x weekly -  3 sets - 10 reps - Seated Knee Extension with Resistance  - 1 x daily - 7 x weekly - 3 sets - 10 reps - Sit to Stand Without Arm Support  - 1 x daily - 7 x weekly - 3 sets - 10 reps - Supine Bridge  - 1 x daily - 7 x weekly - 3 sets - 10 reps - Clam with Resistance  - 1 x daily - 7 x weekly - 2 sets - 12 reps - Hip Extension with Resistance Loop  - 1 x daily - 7 x weekly - 3 sets - 10 reps - Bird Dog  - 1 x daily - 7 x weekly - 3 sets - 10 reps - Marching with Resistance  - 1 x daily - 7 x weekly - 3 sets - 10 reps - Standing Retro Step-Down Heel Tap  - 1 x daily - 7 x weekly - 3 sets - 10 reps  GOALS: Goals reviewed with patient? Yes  SHORT TERM GOALS: Target date: 10/10/2023   Pt will be independent with initial HEP for improved strength, balance, transfers and gait.  Baseline: Goal status: MET  2.  FGA to be assessed and LTG updated  Baseline:  Goal status: MET  3.  Pt will improve gait velocity to at least 2.5 ft/s w/LRAD for improved gait efficiency and  independence   Baseline: 2.15 ft/s no AD; 2.41 ft/s no AD  Goal status: PARTIALLY MET   4.  Pt will improve 5 x STS to less than or equal to 25 seconds to demonstrate improved functional strength and transfer efficiency.   Baseline: 30.79s; 22.34s w/hands braced on knees  Goal status: MET   LONG TERM GOALS: Target date: 10/24/2023   Pt will be independent with final HEP for improved strength, balance, transfers and gait.  Baseline:  Goal status: INITIAL  2.  Pt will improve FGA to 22/30 for decreased fall risk   Baseline: 18/30 Goal status: REVISED  3.  Pt will improve gait velocity to at least 2.7 ft/s w/LRAD for improved gait efficiency and stability   Baseline: 2.15 ft/s no AD; 2.41 ft/s no AD (6/30) Goal status: REVISED  4.  Pt will improve 5 x STS to less than or equal to 20 seconds to demonstrate improved functional strength and transfer efficiency.   Baseline: 30.79s  Goal status: INITIAL   ASSESSMENT:  CLINICAL IMPRESSION: Emphasis of skilled PT session on STG assessment, eccentric quad control and endurance. Pt has met 3 of 4 STGs this date, improving her time on 5x STS w/improved body positioning and completing FGA assessment. Pt scored an 18/30 on FGA, indicative of high fall risk. Pt did not lose balance on any components of FGA but lost points due to slow speed and guarded pattern, especially w/turns and retro gait. Pt did improve her gait speed without AD, but narrowly missed her goal level so consider goal partially met. Pt overall is doing well and is most limited by her confidence w/gait and fluctuating knee pain/edema. Pt in agreement to reduce PT frequency to 1x/week moving forward to conserve visits as she would like to continue with PT at end of POC and is being evaluated by OT next week. Schedule updated as appropriate. Continue per POC.    OBJECTIVE IMPAIRMENTS: Abnormal gait, decreased activity tolerance, decreased balance, decreased cognition,  decreased endurance, decreased knowledge of use of DME, decreased mobility, difficulty walking, decreased ROM, decreased strength, decreased safety awareness, impaired sensation, impaired  UE functional use, improper body mechanics, postural dysfunction, and pain  ACTIVITY LIMITATIONS: carrying, lifting, bending, standing, squatting, stairs, locomotion level, and caring for others  PARTICIPATION LIMITATIONS: cleaning, interpersonal relationship, driving, shopping, community activity, occupation, and yard work  PERSONAL FACTORS: Behavior pattern, Fitness, Transportation, and 1-2 comorbidities: L knee OA and sciatica are also affecting patient's functional outcome.   REHAB POTENTIAL: Good  CLINICAL DECISION MAKING: Evolving/moderate complexity  EVALUATION COMPLEXITY: Moderate  PLAN:  PT FREQUENCY: 1-2x/week  PT DURATION: 6 weeks  PLANNED INTERVENTIONS: 97164- PT Re-evaluation, 97750- Physical Performance Testing, 97110-Therapeutic exercises, 97530- Therapeutic activity, [id number]- Neuromuscular re-education, 97535- Self Care, [id number]- Manual therapy, [phone]- Gait training, [phone]- Aquatic Therapy, Patient/Family education, Balance training, Stair training, Dry Needling, Joint mobilization, Spinal mobilization, Vestibular training, and DME instructions  PLAN FOR NEXT SESSION:   LAND: 10th visit PN. Monitor vitals. Add to HEP prn. Single leg stability, eccentric quad strength, posterior chain strength, spanish squats, long sitting hip flexion    POOL: Follow-up about aquatic HEP - access to Y? Standing balance stuff, eccentric quad/hip abduction on LLE   Cadin Luka E Shraddha Lebron, PT, DPT 10/11/2023, 12:58 PM

## 2023-10-14 ENCOUNTER — Encounter: Payer: Self-pay | Admitting: Physical Therapy

## 2023-10-14 ENCOUNTER — Ambulatory Visit: Attending: Physical Medicine & Rehabilitation | Admitting: Physical Therapy

## 2023-10-14 DIAGNOSIS — M25562 Pain in left knee: Secondary | ICD-10-CM | POA: Insufficient documentation

## 2023-10-14 DIAGNOSIS — M79641 Pain in right hand: Secondary | ICD-10-CM | POA: Insufficient documentation

## 2023-10-14 DIAGNOSIS — R29898 Other symptoms and signs involving the musculoskeletal system: Secondary | ICD-10-CM | POA: Insufficient documentation

## 2023-10-14 DIAGNOSIS — M6281 Muscle weakness (generalized): Secondary | ICD-10-CM | POA: Diagnosis present

## 2023-10-14 DIAGNOSIS — R2689 Other abnormalities of gait and mobility: Secondary | ICD-10-CM | POA: Diagnosis present

## 2023-10-14 DIAGNOSIS — R208 Other disturbances of skin sensation: Secondary | ICD-10-CM | POA: Diagnosis present

## 2023-10-14 DIAGNOSIS — G8929 Other chronic pain: Secondary | ICD-10-CM | POA: Insufficient documentation

## 2023-10-14 DIAGNOSIS — M79642 Pain in left hand: Secondary | ICD-10-CM | POA: Insufficient documentation
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
## 2023-10-14 NOTE — Therapy (Signed)
 OUTPATIENT OCCUPATIONAL THERAPY NEURO EVALUATION  Patient Name: Margaret Owens MRN: [id number] DOB:November 22, 1970, 53 y.o., female 28 Date: 10/18/2023  PCP: Shelda Atlas, MD REFERRING PROVIDER: Carilyn Prentice FORBES, MD  END OF SESSION:  OT End of Session - 10/18/23 1421     Visit Number 1    Number of Visits 16    Date for OT Re-Evaluation 12/19/23    Authorization Type [employer] - Awaiting auth    OT Start Time 1315    OT Stop Time 1400    OT Time Calculation (min) 45 min    Activity Tolerance Patient tolerated treatment well    Behavior During Therapy [facility] for tasks assessed/performed          Past Medical History:  Diagnosis Date   Anxiety    Arthritis    Asthma    Bursitis    Depression    GERD (gastroesophageal reflux disease)    Hypertension    Muscle spasms of both lower extremities    PTSD (post-traumatic stress disorder)    Sciatica    Seizures (HCC)    Uterine fibroid    Past Surgical History:  Procedure Laterality Date   colonoscopy     Patient Active Problem List   Diagnosis Date Noted   Gait disorder 08/19/2023   Positive ANA (antinuclear antibody) 12/03/2021   Urticaria 12/03/2021   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 12/03/2021   Spinal stenosis of lumbar region with radiculopathy 01/23/2021   COVID-19 vaccination declined 05/16/2020   Prediabetes 05/16/2020   Obesity (BMI 35.0-39.9 without comorbidity) 05/16/2020   Primary osteoarthritis of left knee 07/04/2018   Uterine fibroid 06/09/2017   Endometrial stromal nodule 06/09/2017    ONSET DATE: 09/13/2023 (referral date)   REFERRING DIAG: M79.641,M79.642 (ICD-10-CM) - Bilateral hand pain  THERAPY DIAG:  Pain in right hand  Pain in left hand  Muscle weakness (generalized)  Other disturbances of skin sensation  Other symptoms and signs involving the musculoskeletal system  Rationale for Evaluation and Treatment: Rehabilitation  SUBJECTIVE:    SUBJECTIVE STATEMENT: Pt reports Rt thumb pain worse than Lt thumb. Also reports Lt long and ring finger catching - ? Trigger finger Pt accompanied by: self  PERTINENT HISTORY: anxiety, GERD, HTN, PTSD, sciatica, seizures  PRECAUTIONS: None  WEIGHT BEARING RESTRICTIONS: No  PAIN:  Are you having pain? Yes bilateral hands 10/10   FALLS: Has patient fallen in last 6 months? No  Lives with: lives with their daughter Lives in: first floor apt Stairs: No Has following equipment at home: Counselling psychologist, Environmental consultant - 2 wheeled, and Shower bench Has following equipment at home: Single point cane and rollator  PLOF: Independent with basic ADLs and filing for disability  PATIENT GOALS: decrease hand pain and increase strength  OBJECTIVE:  Note: Objective measures were completed at Evaluation unless otherwise noted.  DIAGNOSTIC FINDINGS: MRI of lumbar spine from 08/2020   IMPRESSION: 1. Degenerative changes of the lumbar spine with progressed anterolisthesis at L4-5 where there is mild-to-moderate spinal canal stenosis with narrowing of the bilateral subarticular zones, severe right and moderate left neural foraminal narrowing. 2. Moderate right and severe left neural foraminal narrowing at L5-S1, unchanged.  HAND DOMINANCE: Right  ADLs: Eating: independent  Grooming: independent UB Dressing: independent  LB Dressing: independent  Toileting: independent Bathing: independent Tub Shower transfers: mod I w/ shower chair Equipment: Shower seat with back  IADLs: Shopping: uses Field seismologist (takes public transportation)  Light housekeeping: pt does  Meal Prep: independent Community mobility: pt does not currently drive (d/t finances)  Medication management: independent Handwriting: denies change  MOBILITY STATUS: sometimes walks with cane   FUNCTIONAL OUTCOME MEASURES: Quick Dash: 41% deficit  UPPER EXTREMITY ROM:  BUE AROM WFL's - more difficulty making tight  fist with Lt hand   UPPER EXTREMITY MMT:   WFL's  HAND FUNCTION: Grip strength: Right: 28.2 lbs; Left: 7 lbs,  Lateral pinch: Right: 6 lbs, Left: 4 lbs, and  3 point pinch: Right: 2 lbs, Left: 4 lbs  COORDINATION: 9 Hole Peg test: Right: 25.77 sec; Left: 26.51 sec  SENSATION: Numbness Lt long and ring fingers (volar and dorsal)   EDEMA: mild at joints (Rt > Lt long finger DIP joint w/ arthritic changes)   COGNITION: Overall cognitive status: WFL's, very verbose t/o evaluation and needs redirection  VISION: Subjective report: no changes Baseline vision: Wears glasses for reading only Visual history: none   PERCEPTION: Not tested  PRAXIS: Not tested  OBSERVATIONS: +Finklesteins for DeQuervains tenosynovitis Rt side, but also arthritic changes to Rt CMC joint; lesser Lt CMC joint. Pt also reports catching Lt long and ring finger (? Trigger finger) however did not see during evaluation as it doesn't always catch. Pt reports notices more after tight sustained grip                                                                                                                              TREATMENT DATE: 10/18/23   Discussed O.T. findings and related goals/POC      PATIENT EDUCATION: Education details: SEE ABOVE Person educated: Patient Education method: Explanation Education comprehension: verbalized understanding  HOME EXERCISE PROGRAM: N/A today   GOALS: Goals reviewed with patient? Yes  SHORT TERM GOALS: Target date: 11/18/23  Pt to verbalize understanding with joint protection techniques, task modifications, and possible A/E needs to reduce pain and protect joints Baseline: not yet addressed/issued Goal status: INITIAL  2.  Pt to verbalize understanding with EC techniques and sleep positioning techniques Baseline: not yet addressed/issued Goal status: INITIAL  3.  Pt to be independent with ROM HEP (thumb separate from wrist) Baseline: not yet  addressed/issued Goal status: INITIAL  4.  Pain to reduce down to 8/10 or less bilateral hands Baseline: 10/10  Goal status: INITIAL    LONG TERM GOALS: Target date: 12/19/23  Independent with updated strengthening HEP for bilateral hands Baseline: not yet addressed/issued Goal status: INITIAL  2.  Pt to improve Rt grip by 5 lbs and Lt grip by 10 lbs or more to improve function and ease with opening tight jars/containers Baseline: Rt = 28 lbs, Lt = 7 lbs Goal status: INITIAL  3.  Pt to improve lateral pinch strength bilaterally by 2 lbs or greater Baseline: Rt = 6 lbs, Lt = 4 lbs Goal status: INITIAL  4.  Pt to improve 3 tip pinch strength bilaterally by 2 lbs or greater Baseline: Rt =  2 lbs, Lt = 4 lbs Goal status: INITIAL  5.  Pt to improve BUE deficits as evidenced by reducing deficit on Quick Dash by 10% Baseline: 41% deficit Goal status: INITIAL   ASSESSMENT:  CLINICAL IMPRESSION: Patient is a 53 y.o. female who was seen today for occupational therapy evaluation for bilateral hand pain. Pt has arthritic changes in joints, as well as what appears to be DeQuervains tenosynovitis Rt thumb and possible trigger finger Lt long and ring fingers.  Patient currently presents below baseline level of functioning re: hand function and pain demonstrating functional deficits and impairments as noted below. Pt would benefit from skilled OT services in the outpatient setting to work on impairments as noted below to help pt return to PLOF as able.   SABRA   PERFORMANCE DEFICITS: in functional skills including ADLs, IADLs, coordination, dexterity, sensation, edema, ROM, strength, pain, Fine motor control, body mechanics, decreased knowledge of use of DME, and UE functional use.   IMPAIRMENTS: are limiting patient from ADLs, IADLs, rest and sleep, and leisure.   CO-MORBIDITIES: may have co-morbidities  that affects occupational performance. Patient will benefit from skilled OT to address above  impairments and improve overall function.  MODIFICATION OR ASSISTANCE TO COMPLETE EVALUATION: No modification of tasks or assist necessary to complete an evaluation.  OT OCCUPATIONAL PROFILE AND HISTORY: Detailed assessment: Review of records and additional review of physical, cognitive, psychosocial history related to current functional performance.  CLINICAL DECISION MAKING: Moderate - several treatment options, min-mod task modification necessary  REHAB POTENTIAL: Good  EVALUATION COMPLEXITY: Moderate    PLAN:  OT FREQUENCY: 1-2x/week  OT DURATION: 8 weeks  PLANNED INTERVENTIONS: 97535 self care/ADL training, 02889 therapeutic exercise, 97530 therapeutic activity, 97140 manual therapy, 97113 aquatic therapy, 97035 ultrasound, 97018 paraffin, 02960 fluidotherapy, 97010 moist heat, 97010 cryotherapy, 97034 contrast bath, 97760 Orthotic Initial, 97763 Orthotic/Prosthetic subsequent, passive range of motion, patient/family education, and DME and/or AE instructions  RECOMMENDED OTHER SERVICES: none at this time - currently seeing P.T.   CONSULTED AND AGREED WITH PLAN OF CARE: Patient  PLAN FOR NEXT SESSION: check on MCD auth, Joint protection, A/E recommendations, task modifications, sleep positioning educ  Following session: A/ROM HEP    For all possible CPT codes, reference the Planned Interventions line above.     Check all conditions that are expected to impact treatment: {Conditions expected to impact treatment:Neurological condition and/or seizures and Psychological or psychiatric disorders   If treatment provided at initial evaluation, no treatment charged due to lack of authorization.        Burnard JINNY Roads, OT 10/18/2023, 2:22 PM

## 2023-10-18 ENCOUNTER — Ambulatory Visit: Admitting: Physical Therapy

## 2023-10-18 ENCOUNTER — Ambulatory Visit: Admitting: Occupational Therapy

## 2023-10-18 DIAGNOSIS — G8929 Other chronic pain: Secondary | ICD-10-CM

## 2023-10-18 DIAGNOSIS — M6281 Muscle weakness (generalized): Secondary | ICD-10-CM

## 2023-10-18 DIAGNOSIS — R2689 Other abnormalities of gait and mobility: Secondary | ICD-10-CM

## 2023-10-18 DIAGNOSIS — R208 Other disturbances of skin sensation: Secondary | ICD-10-CM

## 2023-10-18 DIAGNOSIS — M79642 Pain in left hand: Secondary | ICD-10-CM

## 2023-10-18 DIAGNOSIS — R29898 Other symptoms and signs involving the musculoskeletal system: Secondary | ICD-10-CM

## 2023-10-18 DIAGNOSIS — M79641 Pain in right hand: Secondary | ICD-10-CM

## 2023-10-18 NOTE — Therapy (Signed)
 OUTPATIENT PHYSICAL THERAPY NEURO TREATMENT - 10TH VISIT PROGRESS NOTE AND RECERTIFICATION   Patient Name: CYNTHIS PURINGTON MRN: [id number] DOB:[birth year], 53 y.o., female Today's Date: 10/18/2023   PCP: Shelda Atlas, MD REFERRING PROVIDER: Carilyn Prentice FORBES, MD  Physical Therapy Progress Note   Dates of Reporting Period:09/09/23 - 10/18/23  See Note below for Objective Data and Assessment of Progress/Goals.   END OF SESSION:  PT End of Session - 10/18/23 1406     Visit Number 10    Number of Visits 14   Recert   Date for PT Re-Evaluation 11/15/23   Recert   Authorization Type [place] [employer]    PT Start Time 1405    PT Stop Time 1449    PT Time Calculation (min) 44 min    Equipment Utilized During Treatment --    Activity Tolerance Patient limited by pain    Behavior During Therapy WFL for tasks assessed/performed              Past Medical History:  Diagnosis Date   Anxiety    Arthritis    Asthma    Bursitis    Depression    GERD (gastroesophageal reflux disease)    Hypertension    Muscle spasms of both lower extremities    PTSD (post-traumatic stress disorder)    Sciatica    Seizures (HCC)    Uterine fibroid    Past Surgical History:  Procedure Laterality Date   colonoscopy     Patient Active Problem List   Diagnosis Date Noted   Gait disorder 08/19/2023   Positive ANA (antinuclear antibody) 12/03/2021   Urticaria 12/03/2021   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 12/03/2021   Spinal stenosis of lumbar region with radiculopathy 01/23/2021   COVID-19 vaccination declined 05/16/2020   Prediabetes 05/16/2020   Obesity (BMI 35.0-39.9 without comorbidity) 05/16/2020   Primary osteoarthritis of left knee 07/04/2018   Uterine fibroid 06/09/2017   Endometrial stromal nodule 06/09/2017    ONSET DATE: 08/19/2023 (referral)  REFERRING DIAG: R26.9 (ICD-10-CM) - Gait disorder  THERAPY DIAG:  Other abnormalities of gait and  mobility  Muscle weakness (generalized)  Chronic pain of left knee  Rationale for Evaluation and Treatment: Rehabilitation  SUBJECTIVE:                                                                                                                                                                                             SUBJECTIVE STATEMENT: Pt presents w/[facility]. States her knee pain is really bad today and radiating up to her hip. No falls.    Pt accompanied by: self  PERTINENT HISTORY: anxiety, GERD, HTN, PTSD, sciatica, seizures  PAIN:  Are you having pain? Yes: NPRS scale: 10/10 Pain location: L knee Pain description: Achy/throbbing  Aggravating factors: Walking Relieving factors: Oxycodone , Voltaren    PRECAUTIONS: Fall  RED FLAGS: None   WEIGHT BEARING RESTRICTIONS: No  FALLS: Has patient fallen in last 6 months? No  LIVING ENVIRONMENT: Lives with: lives with their daughter Lives in: House/apartment Stairs: No Has following equipment at home: Counselling psychologist, Environmental consultant - 2 wheeled, Marine scientist  PLOF: Independent  PATIENT GOALS: to learn some things to build up my muscle mass and strengthen my legs so I can be balanced more   OBJECTIVE:  Note: Objective measures were completed at Evaluation unless otherwise noted.  DIAGNOSTIC FINDINGS: MRI of lumbar spine from 08/2020  IMPRESSION: 1. Degenerative changes of the lumbar spine with progressed anterolisthesis at L4-5 where there is mild-to-moderate spinal canal stenosis with narrowing of the bilateral subarticular zones, severe right and moderate left neural foraminal narrowing. 2. Moderate right and severe left neural foraminal narrowing at L5-S1, unchanged.  COGNITION: Overall cognitive status: Difficulty to assess due to: no family present   SENSATION: Pt reports occasional numbness in LLE and frequent tingling in bilateral hands    POSTURE: rounded shoulders, forward head, and posterior  pelvic tilt  LOWER EXTREMITY ROM:     Active  Right Eval Left Eval  Hip flexion    Hip extension    Hip abduction    Hip adduction    Hip internal rotation    Hip external rotation    Knee flexion    Knee extension  Lacking full ROM due to pain, not formally assessed  Ankle dorsiflexion    Ankle plantarflexion    Ankle inversion    Ankle eversion     (Blank rows = not tested)  LOWER EXTREMITY MMT:  Tested in seated position   MMT Right Eval Left Eval  Hip flexion 4- 4-  Hip extension    Hip abduction 3+ 3+  Hip adduction 3+ 3+  Hip internal rotation    Hip external rotation    Knee flexion 4+ 3+  Knee extension 4+ 3+  Ankle dorsiflexion 5 5  Ankle plantarflexion    Ankle inversion    Ankle eversion    (Blank rows = not tested)  BED MOBILITY:  Not tested Pt reports independence w/this   TRANSFERS: Sit to stand: Modified independence  Assistive device utilized: None     Stand to sit: Modified independence  Assistive device utilized: None      RAMP:  Not tested  CURB:  Not tested  STAIRS: Not tested GAIT: Gait pattern: step through pattern, decreased arm swing- Right, decreased arm swing- Left, decreased stride length, knee flexed in stance- Left, scissoring, antalgic, lateral hip instability, lateral lean- Left, decreased trunk rotation, trunk flexed, and wide BOS Distance walked: Various clinic distances  Assistive device utilized: Stroller and None Level of assistance: Modified independence Comments: Noted scissoring of BLEs this date (LLE >RLE). Pt ambulated into/out of session w/rollator but throughout session w/no AD at mod I level   VITALS  There were no vitals filed for this visit.  TREATMENT:    Ther Act/self-care  SciFit multi-peaks level 6.5 for 10 minutes using BUE/BLEs for neural priming for reciprocal movement, dynamic cardiovascular  warmup and increased amplitude of stepping. Pt reporting cramping of LLE throughout activity and rated pain in LLE as 12/10 following activity.  Pt performed sit > supine mod I on mat table and the following were performed for improved pain modulation, hip mobility and quad strength:  Supine iron crosses, x5 per side. Pt reported significant pain and began crying, so halted activity  Supine knee to chest stretch, x60s per side. Pt able to tolerate slightly  Supine figure-4 stretch, x90s per side. Pt extremely symptomatic on L side.  Provided deep pressure trigger point release to L quad via lacrosse ball x5 minutes, which pt able to tolerate w/breaks. Pt reports frequent spasms in LLE that do not dissipate with exercise. Pt inquiring about muscle relaxants, so recommended pt make appointment w/PCP to discuss options. Also noted pt has positive ANA and did not follow up with rheumatologist, so provided pt w/contact information for rheumatologist and encouraged her to call him as well as her knee pain may be auto-immune in etiology. Pt verbalized understanding.     PATIENT EDUCATION: Education details: Importance of calling PCP and rheumatologist for follow-up appointment, potential etiology for knee pain  Person educated: Patient Education method: Explanation, Demonstration, and Handouts Education comprehension: verbalized understanding, returned demonstration, and needs further education  HOME EXERCISE PROGRAM: Access Code: [email or web address] URL: [email or web address] Date: 09/20/2023 Prepared by: Marlon Rickardo Brinegar  Exercises - Side Stepping with Resistance at Thighs and Counter Support  - 1 x daily - 7 x weekly - 3 sets - 10 reps - Forward Backward Monster Walk with Band at [employer] and Counter Support  - 1 x daily - 7 x weekly - 3 sets - 10 reps - Seated Knee Extension with Resistance  - 1 x daily - 7 x weekly - 3 sets - 10 reps - Sit to Stand Without Arm Support  - 1 x daily - 7 x  weekly - 3 sets - 10 reps - Supine Bridge  - 1 x daily - 7 x weekly - 3 sets - 10 reps - Clam with Resistance  - 1 x daily - 7 x weekly - 2 sets - 12 reps - Hip Extension with Resistance Loop  - 1 x daily - 7 x weekly - 3 sets - 10 reps - Bird Dog  - 1 x daily - 7 x weekly - 3 sets - 10 reps - Marching with Resistance  - 1 x daily - 7 x weekly - 3 sets - 10 reps - Standing Retro Step-Down Heel Tap  - 1 x daily - 7 x weekly - 3 sets - 10 reps  GOALS: Goals reviewed with patient? Yes  SHORT TERM GOALS: Target date: 10/10/2023   Pt will be independent with initial HEP for improved strength, balance, transfers and gait.  Baseline: Goal status: MET  2.  FGA to be assessed and LTG updated  Baseline:  Goal status: MET  3.  Pt will improve gait velocity to at least 2.5 ft/s w/LRAD for improved gait efficiency and independence   Baseline: 2.15 ft/s no AD; 2.41 ft/s no AD  Goal status: PARTIALLY MET   4.  Pt will improve 5 x STS to less than or equal to 25 seconds to demonstrate improved functional strength and transfer efficiency.   Baseline: 30.79s; 22.34s w/hands braced on knees  Goal status: MET  LONG TERM GOALS: Target date: 11/07/2023 (updated for extended POC)    Pt will be independent with final HEP for improved strength, balance, transfers and gait.  Baseline:  Goal status: INITIAL  2.  Pt will improve FGA to 22/30 for decreased fall risk   Baseline: 18/30 Goal status: REVISED  3.  Pt will improve gait velocity to at least 2.7 ft/s w/LRAD for improved gait efficiency and stability   Baseline: 2.15 ft/s no AD; 2.41 ft/s no AD (6/30) Goal status: REVISED  4.  Pt will improve 5 x STS to less than or equal to 20 seconds to demonstrate improved functional strength and transfer efficiency.   Baseline: 30.79s  Goal status: INITIAL   ASSESSMENT:  CLINICAL IMPRESSION: Emphasis of skilled PT session on LLE pain modulation and pt education. Pt reporting significant  pain in LLE this date and could not tolerate light activity well. Noted a significant trigger point in proximal medial quad that did release some w/use of deep pressure, but pt reports she feels this area of her leg spasm frequently and is very painful. Encouraged pt to make appointment w/PCP, as she has not seen him for ~2 years due to losing her insurance. Also encouraged pt to reach out to rheumatologist, as she did have positive ANA 2 years ago and her knee pain does not follow OA etiology. Continue POC.   OBJECTIVE IMPAIRMENTS: Abnormal gait, decreased activity tolerance, decreased balance, decreased cognition, decreased endurance, decreased knowledge of use of DME, decreased mobility, difficulty walking, decreased ROM, decreased strength, decreased safety awareness, impaired sensation, impaired UE functional use, improper body mechanics, postural dysfunction, and pain  ACTIVITY LIMITATIONS: carrying, lifting, bending, standing, squatting, stairs, locomotion level, and caring for others  PARTICIPATION LIMITATIONS: cleaning, interpersonal relationship, driving, shopping, community activity, occupation, and yard work  PERSONAL FACTORS: Behavior pattern, Fitness, Transportation, and 1-2 comorbidities: L knee OA and sciatica are also affecting patient's functional outcome.   REHAB POTENTIAL: Good  CLINICAL DECISION MAKING: Evolving/moderate complexity  EVALUATION COMPLEXITY: Moderate  PLAN:  PT FREQUENCY: 1-2x/week  PT DURATION: 6 weeks + 4 weeks (recert)   PLANNED INTERVENTIONS: 02835- PT Re-evaluation, 97750- Physical Performance Testing, 97110-Therapeutic exercises, 97530- Therapeutic activity, [id number]- Neuromuscular re-education, 97535- Self Care, 02859- Manual therapy, [phone]- Gait training, [phone]- Aquatic Therapy, Patient/Family education, Balance training, Stair training, Dry Needling, Joint mobilization, Spinal mobilization, Vestibular training, and DME instructions  PLAN FOR NEXT  SESSION:   LAND: Monitor vitals. Add to HEP prn. Single leg stability, eccentric quad strength, posterior chain strength, spanish squats, long sitting hip flexion    POOL: AQUATIC HEP for [employer] - need by 7/17! Standing balance stuff, eccentric quad/hip abduction on LLE   Keyen Marban E Mattelyn Imhoff, PT, DPT 10/18/2023, 3:06 PM

## 2023-10-20 ENCOUNTER — Encounter: Payer: Self-pay | Admitting: Physical Medicine & Rehabilitation

## 2023-10-21 ENCOUNTER — Ambulatory Visit: Admitting: Physical Therapy

## 2023-10-25 ENCOUNTER — Ambulatory Visit: Admitting: Physical Therapy

## 2023-10-27 ENCOUNTER — Encounter: Payer: Self-pay | Admitting: Occupational Therapy

## 2023-10-27 ENCOUNTER — Ambulatory Visit: Admitting: Occupational Therapy

## 2023-10-27 DIAGNOSIS — R208 Other disturbances of skin sensation: Secondary | ICD-10-CM

## 2023-10-27 DIAGNOSIS — M79642 Pain in left hand: Secondary | ICD-10-CM

## 2023-10-27 DIAGNOSIS — R2689 Other abnormalities of gait and mobility: Secondary | ICD-10-CM | POA: Diagnosis not present

## 2023-10-27 DIAGNOSIS — M6281 Muscle weakness (generalized): Secondary | ICD-10-CM

## 2023-10-27 DIAGNOSIS — R29898 Other symptoms and signs involving the musculoskeletal system: Secondary | ICD-10-CM

## 2023-10-27 DIAGNOSIS — M79641 Pain in right hand: Secondary | ICD-10-CM

## 2023-10-27 NOTE — Patient Instructions (Signed)
 Joint Protection Respect pain/Develop pain awareness Monitor activities - stop to rest when discomfort or fatigue develops Awareness of pain for activities prior to 12-24 hrs Pain lingering 2 hrs after activity is a warning sign - modify or eliminate  Use larger joints and muscles Protect the delicate joints by using larger joints - use shoulder to carry bags not hands More proximal muscles and joints to perform ADL's (forearm/shoulder to open door-protect fingers) Groceries in paper bags Let the hip, elbow and arm support the grocery bag/push cart's weight Push up from seat with palms of hands, not back of fingers  Avoid tight/strong and prolonged grasp Build up handles (foam or cloth) on utensils, tools and pens Modify handles with levers Use AE for assistance for opening jars, pens and books, turning knobs and keys Change grasp to avoid static positioning Work with fingers extended over a large sponge rather than squeezing it Use open, flat palm to open jars, pump bottles for shampoo/soaps  Avoid carrying, lifting and using heavy objects Use a cart to move heavy objects Push heavy objects with hips instead of pulling with hands Distribute weight over many joints - use 2 hands to lift an item Use light weight tools and objects Use smaller containers of liquid or distribute liquids to smaller containers to lighten loads  Balance rest and activity Plan rest breaks ahead of time Avoid activities that cannot be stopped if needed Energy conservation techniques (sitting when able, planning ahead, organizing workspace and storage for accessibility, keeping most common used items within easy access, resting during activities, use timesavers like prepared foods) AE-Labor saving devices (modified cooking writing or cutting utensils, zipper pulls, button hooks, built up handle items, large grip pens, jar openers, key holder/turner, garden tools, and spring loaded scissors) - see below  Use  splints as prescribes to protect joints    Pen Again  Electric can opener Jar opener Make handles bigger

## 2023-10-27 NOTE — Therapy (Signed)
 OUTPATIENT OCCUPATIONAL THERAPY NEURO TREATMENT  Patient Name: Margaret Owens MRN: [id number] DOB:08-Nov-1970, 52 y.o., female 18 Date: 10/27/2023  PCP: Shelda Atlas, MD REFERRING PROVIDER: Carilyn Prentice FORBES, MD  END OF SESSION:  OT End of Session - 10/27/23 1319     Visit Number 2    Number of Visits 16    Date for OT Re-Evaluation 12/19/23    Authorization Type [employer] - Awaiting auth    OT Start Time 1318    OT Stop Time 1400    OT Time Calculation (min) 42 min    Activity Tolerance Patient tolerated treatment well    Behavior During Therapy WFL for tasks assessed/performed          Past Medical History:  Diagnosis Date   Anxiety    Arthritis    Asthma    Bursitis    Depression    GERD (gastroesophageal reflux disease)    Hypertension    Muscle spasms of both lower extremities    PTSD (post-traumatic stress disorder)    Sciatica    Seizures (HCC)    Uterine fibroid    Past Surgical History:  Procedure Laterality Date   colonoscopy     Patient Active Problem List   Diagnosis Date Noted   Gait disorder 08/19/2023   Positive ANA (antinuclear antibody) 12/03/2021   Urticaria 12/03/2021   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 12/03/2021   Spinal stenosis of lumbar region with radiculopathy 01/23/2021   COVID-19 vaccination declined 05/16/2020   Prediabetes 05/16/2020   Obesity (BMI 35.0-39.9 without comorbidity) 05/16/2020   Primary osteoarthritis of left knee 07/04/2018   Uterine fibroid 06/09/2017   Endometrial stromal nodule 06/09/2017    ONSET DATE: 09/13/2023 (referral date)   REFERRING DIAG: M79.641,M79.642 (ICD-10-CM) - Bilateral hand pain  THERAPY DIAG:  Pain in right hand  Pain in left hand  Muscle weakness (generalized)  Other disturbances of skin sensation  Other symptoms and signs involving the musculoskeletal system  Rationale for Evaluation and Treatment: Rehabilitation  SUBJECTIVE:    SUBJECTIVE STATEMENT: Pt reports Rt thumb pain worse than Lt thumb. Also reports Lt long and ring finger catching - ? Trigger finger Pt accompanied by: self  PERTINENT HISTORY: anxiety, GERD, HTN, PTSD, sciatica, seizures  PRECAUTIONS: None  WEIGHT BEARING RESTRICTIONS: No  PAIN:  Are you having pain? Yes bilateral hands 6/10 Rt thumb worse than Lt, Lt ring and long finger  FALLS: Has patient fallen in last 6 months? No  Lives with: lives with their daughter Lives in: first floor apt Stairs: No Has following equipment at home: Counselling psychologist, Environmental consultant - 2 wheeled, and Shower bench Has following equipment at home: Single point cane and rollator  PLOF: Independent with basic ADLs and filing for disability  PATIENT GOALS: decrease hand pain and increase strength  OBJECTIVE:  Note: Objective measures were completed at Evaluation unless otherwise noted.  DIAGNOSTIC FINDINGS: MRI of lumbar spine from 08/2020   IMPRESSION: 1. Degenerative changes of the lumbar spine with progressed anterolisthesis at L4-5 where there is mild-to-moderate spinal canal stenosis with narrowing of the bilateral subarticular zones, severe right and moderate left neural foraminal narrowing. 2. Moderate right and severe left neural foraminal narrowing at L5-S1, unchanged.  HAND DOMINANCE: Right  ADLs: Eating: independent  Grooming: independent UB Dressing: independent  LB Dressing: independent  Toileting: independent Bathing: independent Tub Shower transfers: mod I w/ shower chair Equipment: Shower seat with back  IADLs: Shopping: uses baby  stroller (takes public transportation)  Light housekeeping: pt does Meal Prep: independent Community mobility: pt does not currently drive (d/t finances)  Medication management: independent Handwriting: denies change  MOBILITY STATUS: sometimes walks with cane   FUNCTIONAL OUTCOME MEASURES: Quick Dash: 41% deficit  UPPER EXTREMITY ROM:   BUE AROM WFL's - more difficulty making tight fist with Lt hand   UPPER EXTREMITY MMT:   WFL's  HAND FUNCTION: Grip strength: Right: 28.2 lbs; Left: 7 lbs,  Lateral pinch: Right: 6 lbs, Left: 4 lbs, and  3 point pinch: Right: 2 lbs, Left: 4 lbs  COORDINATION: 9 Hole Peg test: Right: 25.77 sec; Left: 26.51 sec  SENSATION: Numbness Lt long and ring fingers (volar and dorsal)   EDEMA: mild at joints (Rt > Lt long finger DIP joint w/ arthritic changes)   COGNITION: Overall cognitive status: WFL's, very verbose t/o evaluation and needs redirection  VISION: Subjective report: no changes Baseline vision: Wears glasses for reading only Visual history: none   PERCEPTION: Not tested  PRAXIS: Not tested  OBSERVATIONS: +Finklesteins for DeQuervains tenosynovitis Rt side, but also arthritic changes to Rt CMC joint; lesser Lt CMC joint. Pt also reports catching Lt long and ring finger (? Trigger finger) however did not see during evaluation as it doesn't always catch. Pt reports notices more after tight sustained grip                                                                                                                              TREATMENT DATE: 10/27/23   MCD authorization still pending  Discussed joint protection strategies, task modifications, and A/E to help manage pain, protect joints, and reduce symptoms. Pt given handouts on multiple A/E including: veggie chopper, jar opener, Pen Again, spring loaded scissors, medicine bottle opener, etc.   Pt practiced writing with various writing aids - pt preferred Pen Again  Discussed sleep positioning and recommended arms supported on pillows    PATIENT EDUCATION: Education details: SEE ABOVE Person educated: Patient Education method: Explanation Education comprehension: verbalized understanding  HOME EXERCISE PROGRAM: 10/27/23: joint protection techniques, A/E recommendations   GOALS: Goals reviewed with patient?  Yes  SHORT TERM GOALS: Target date: 11/18/23  Pt to verbalize understanding with joint protection techniques, task modifications, and possible A/E needs to reduce pain and protect joints Baseline: not yet addressed/issued Goal status: MET  2.  Pt to verbalize understanding with EC techniques and sleep positioning techniques Baseline: not yet addressed/issued Goal status: IN PROGRESS  3.  Pt to be independent with ROM HEP (thumb separate from wrist) Baseline: not yet addressed/issued Goal status: INITIAL  4.  Pain to reduce down to 8/10 or less bilateral hands Baseline: 10/10  Goal status: INITIAL    LONG TERM GOALS: Target date: 12/19/23  Independent with updated strengthening HEP for bilateral hands Baseline: not yet addressed/issued Goal status: INITIAL  2.  Pt to improve Rt grip by 5 lbs and Lt grip  by 10 lbs or more to improve function and ease with opening tight jars/containers Baseline: Rt = 28 lbs, Lt = 7 lbs Goal status: INITIAL  3.  Pt to improve lateral pinch strength bilaterally by 2 lbs or greater Baseline: Rt = 6 lbs, Lt = 4 lbs Goal status: INITIAL  4.  Pt to improve 3 tip pinch strength bilaterally by 2 lbs or greater Baseline: Rt = 2 lbs, Lt = 4 lbs Goal status: INITIAL  5.  Pt to improve BUE deficits as evidenced by reducing deficit on Quick Dash by 10% Baseline: 41% deficit Goal status: INITIAL   ASSESSMENT:  CLINICAL IMPRESSION: Patient is a 53 y.o. female who was seen today for occupational therapy evaluation for bilateral hand pain. Pt has arthritic changes in joints, as well as what appears to be DeQuervains tenosynovitis Rt thumb and possible trigger finger Lt long and ring fingers.  Pt with greater understanding of A/E needs, joint protection techniques and task modifications to reduce symptoms and protect joints .   PERFORMANCE DEFICITS: in functional skills including ADLs, IADLs, coordination, dexterity, sensation, edema, ROM, strength, pain,  Fine motor control, body mechanics, decreased knowledge of use of DME, and UE functional use.   IMPAIRMENTS: are limiting patient from ADLs, IADLs, rest and sleep, and leisure.   CO-MORBIDITIES: may have co-morbidities  that affects occupational performance. Patient will benefit from skilled OT to address above impairments and improve overall function.  MODIFICATION OR ASSISTANCE TO COMPLETE EVALUATION: No modification of tasks or assist necessary to complete an evaluation.  OT OCCUPATIONAL PROFILE AND HISTORY: Detailed assessment: Review of records and additional review of physical, cognitive, psychosocial history related to current functional performance.  CLINICAL DECISION MAKING: Moderate - several treatment options, min-mod task modification necessary  REHAB POTENTIAL: Good  EVALUATION COMPLEXITY: Moderate    PLAN:  OT FREQUENCY: 1-2x/week  OT DURATION: 8 weeks  PLANNED INTERVENTIONS: 97535 self care/ADL training, 02889 therapeutic exercise, 97530 therapeutic activity, 97140 manual therapy, 97113 aquatic therapy, 97035 ultrasound, 97018 paraffin, 02960 fluidotherapy, 97010 moist heat, 97010 cryotherapy, 97034 contrast bath, 97760 Orthotic Initial, 97763 Orthotic/Prosthetic subsequent, passive range of motion, patient/family education, and DME and/or AE instructions  RECOMMENDED OTHER SERVICES: none at this time - currently seeing P.T.   CONSULTED AND AGREED WITH PLAN OF CARE: Patient  PLAN FOR NEXT SESSION: check on MCD auth, A/ROM HEP    For all possible CPT codes, reference the Planned Interventions line above.     Check all conditions that are expected to impact treatment: {Conditions expected to impact treatment:Neurological condition and/or seizures and Psychological or psychiatric disorders   If treatment provided at initial evaluation, no treatment charged due to lack of authorization.        Burnard JINNY Roads, OT 10/27/2023, 1:21 PM

## 2023-10-28 ENCOUNTER — Ambulatory Visit: Admitting: Physical Therapy

## 2023-10-28 DIAGNOSIS — R2689 Other abnormalities of gait and mobility: Secondary | ICD-10-CM

## 2023-10-28 DIAGNOSIS — G8929 Other chronic pain: Secondary | ICD-10-CM

## 2023-10-28 DIAGNOSIS — R29898 Other symptoms and signs involving the musculoskeletal system: Secondary | ICD-10-CM

## 2023-10-28 DIAGNOSIS — M6281 Muscle weakness (generalized): Secondary | ICD-10-CM

## 2023-10-28 DIAGNOSIS — R208 Other disturbances of skin sensation: Secondary | ICD-10-CM

## 2023-11-01 ENCOUNTER — Ambulatory Visit: Admitting: Physical Therapy

## 2023-11-01 ENCOUNTER — Encounter: Payer: Self-pay | Admitting: Physical Therapy

## 2023-11-01 DIAGNOSIS — G8929 Other chronic pain: Secondary | ICD-10-CM

## 2023-11-01 DIAGNOSIS — M6281 Muscle weakness (generalized): Secondary | ICD-10-CM

## 2023-11-01 DIAGNOSIS — R2689 Other abnormalities of gait and mobility: Secondary | ICD-10-CM

## 2023-11-01 NOTE — Therapy (Signed)
 OUTPATIENT PHYSICAL THERAPY NEURO TREATMENT   Patient Name: JANALYNN EDER MRN: [id number] DOB:16-Apr-1970, 53 y.o., female 51 Date: 11/01/2023   PCP: Shelda Atlas, MD REFERRING PROVIDER: Carilyn Prentice FORBES, MD    END OF SESSION:   10/28/23 1405  PT Visits / Re-Eval  Visit Number 11  Number of Visits 14 (Recert)  Date for PT Re-Evaluation 11/15/23 (Recert)  Authorization  Authorization Type [place] [employer]  PT Time Calculation  PT Start Time 0845  PT Stop Time 0930  PT Time Calculation (min) 45 min  PT - End of Session  Activity Tolerance Patient limited by pain  Behavior During Therapy [facility] for tasks assessed/performed   Past Medical History:  Diagnosis Date   Anxiety    Arthritis    Asthma    Bursitis    Depression    GERD (gastroesophageal reflux disease)    Hypertension    Muscle spasms of both lower extremities    PTSD (post-traumatic stress disorder)    Sciatica    Seizures (HCC)    Uterine fibroid    Past Surgical History:  Procedure Laterality Date   colonoscopy     Patient Active Problem List   Diagnosis Date Noted   Gait disorder 08/19/2023   Positive ANA (antinuclear antibody) 12/03/2021   Urticaria 12/03/2021   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 12/03/2021   Spinal stenosis of lumbar region with radiculopathy 01/23/2021   COVID-19 vaccination declined 05/16/2020   Prediabetes 05/16/2020   Obesity (BMI 35.0-39.9 without comorbidity) 05/16/2020   Primary osteoarthritis of left knee 07/04/2018   Uterine fibroid 06/09/2017   Endometrial stromal nodule 06/09/2017    ONSET DATE: 08/19/2023 (referral)  REFERRING DIAG: R26.9 (ICD-10-CM) - Gait disorder  THERAPY DIAG:  Muscle weakness (generalized)  Other disturbances of skin sensation  Other symptoms and signs involving the musculoskeletal system  Other abnormalities of gait and mobility  Chronic pain of left knee  Rationale for Evaluation and Treatment:  Rehabilitation  SUBJECTIVE:                                                                                                                                                                                             SUBJECTIVE STATEMENT: Pt presents w/o AD to [facility]. She reports she feels okay today. No falls.    Pt accompanied by: self  PERTINENT HISTORY: anxiety, GERD, HTN, PTSD, sciatica, seizures  PAIN:  Are you having pain? Yes: NPRS scale: 3/10 Pain location: L knee Pain description: Achy/throbbing  Aggravating factors: Walking Relieving factors: Oxycodone , Voltaren    PRECAUTIONS: Fall  RED FLAGS: None   WEIGHT BEARING  RESTRICTIONS: No  FALLS: Has patient fallen in last 6 months? No  LIVING ENVIRONMENT: Lives with: lives with their daughter Lives in: House/apartment Stairs: No Has following equipment at home: Counselling psychologist, Environmental consultant - 2 wheeled, Marine scientist  PLOF: Independent  PATIENT GOALS: to learn some things to build up my muscle mass and strengthen my legs so I can be balanced more   OBJECTIVE:  Note: Objective measures were completed at Evaluation unless otherwise noted.  DIAGNOSTIC FINDINGS: MRI of lumbar spine from 08/2020  IMPRESSION: 1. Degenerative changes of the lumbar spine with progressed anterolisthesis at L4-5 where there is mild-to-moderate spinal canal stenosis with narrowing of the bilateral subarticular zones, severe right and moderate left neural foraminal narrowing. 2. Moderate right and severe left neural foraminal narrowing at L5-S1, unchanged.  COGNITION: Overall cognitive status: Difficulty to assess due to: no family present   SENSATION: Pt reports occasional numbness in LLE and frequent tingling in bilateral hands    POSTURE: rounded shoulders, forward head, and posterior pelvic tilt  LOWER EXTREMITY ROM:     Active  Right Eval Left Eval  Hip flexion    Hip extension    Hip abduction    Hip adduction     Hip internal rotation    Hip external rotation    Knee flexion    Knee extension  Lacking full ROM due to pain, not formally assessed  Ankle dorsiflexion    Ankle plantarflexion    Ankle inversion    Ankle eversion     (Blank rows = not tested)  LOWER EXTREMITY MMT:  Tested in seated position   MMT Right Eval Left Eval  Hip flexion 4- 4-  Hip extension    Hip abduction 3+ 3+  Hip adduction 3+ 3+  Hip internal rotation    Hip external rotation    Knee flexion 4+ 3+  Knee extension 4+ 3+  Ankle dorsiflexion 5 5  Ankle plantarflexion    Ankle inversion    Ankle eversion    (Blank rows = not tested)  BED MOBILITY:  Not tested Pt reports independence w/this   TRANSFERS: Sit to stand: Modified independence  Assistive device utilized: None     Stand to sit: Modified independence  Assistive device utilized: None      RAMP:  Not tested  CURB:  Not tested  STAIRS: Not tested GAIT: Gait pattern: step through pattern, decreased arm swing- Right, decreased arm swing- Left, decreased stride length, knee flexed in stance- Left, scissoring, antalgic, lateral hip instability, lateral lean- Left, decreased trunk rotation, trunk flexed, and wide BOS Distance walked: Various clinic distances  Assistive device utilized: Stroller and None Level of assistance: Modified independence Comments: Noted scissoring of BLEs this date (LLE >RLE). Pt ambulated into/out of session w/rollator but throughout session w/no AD at mod I level   VITALS  There were no vitals filed for this visit.  TREATMENT:    Aquatic therapy at [facility] - pool temperature 92 degrees   Patient seen for aquatic therapy today.  Treatment took place in water 3.6-4.8 feet deep depending upon activity.  Patient entered and exited the pool via stairs using reciprocal pattern at bilateral rails at mod I level.    Exercises: Walking warmup using aquatic barbell 4x18 ft forward > backward > laterally Marching 3x20 working into increased ROM Squats at pool wall 3x10 [employer] 3x20, some low back pain that resolves w/ seated recovery Completed seated core using low resistance dumbbells for shoulder flexion > abduction > chest press 2x20 STS x10 > x16 Hamstring stretch w/ noodle w/ slow oscillations 2x1 minute each side  Patient requires buoyancy of the water for support for reduced fall risk with gait training and balance exercises with no more than SBA support w/ cues for form. Exercises able to be performed safely in water without the risk of fall compared to those same exercises performed on land; viscosity of water needed for resistance for strengthening. Current of water provides perturbations for challenging static and dynamic balance.    PATIENT EDUCATION: Education details: Will provide aquatic HEP at next visit. Person educated: Patient Education method: Explanation, Demonstration, and Handouts Education comprehension: verbalized understanding, returned demonstration, and needs further education  HOME EXERCISE PROGRAM: Access Code: [email or web address] URL: [email or web address] Date: 09/20/2023 Prepared by: Marlon Plaster  Exercises - Side Stepping with Resistance at Thighs and Counter Support  - 1 x daily - 7 x weekly - 3 sets - 10 reps - Forward Backward Monster Walk with Band at [employer] and Counter Support  - 1 x daily - 7 x weekly - 3 sets - 10 reps - Seated Knee Extension with Resistance  - 1 x daily - 7 x weekly - 3 sets - 10 reps - Sit to Stand Without Arm Support  - 1 x daily - 7 x weekly - 3 sets - 10 reps - Supine Bridge  - 1 x daily - 7 x weekly - 3 sets - 10 reps - Clam with Resistance  - 1 x daily - 7 x weekly - 2 sets - 12 reps - Hip Extension with Resistance Loop  - 1 x daily - 7 x weekly - 3 sets - 10 reps - Bird Dog  - 1 x daily - 7 x weekly - 3 sets - 10  reps - Marching with Resistance  - 1 x daily - 7 x weekly - 3 sets - 10 reps - Standing Retro Step-Down Heel Tap  - 1 x daily - 7 x weekly - 3 sets - 10 reps  AQUATIC HEP: Access Code: [email or web address] URL: [email or web address] Date: 11/01/2023 Prepared by: Daved Bull  Exercises - Standing March at [facility]  - 1 x daily - 7 x weekly - 1 sets - 10 reps - Squat  - 1 x daily - 7 x weekly - 1 sets - 10 reps - Side Stepping  - 1 x daily - 7 x weekly - 3 sets - 10 reps - Forward Walking  - 1 x daily - 7 x weekly - 3 sets - 10 reps - Backward Walking  - 1 x daily - 7 x weekly - 3 sets - 10 reps - Bilateral Shoulder Flexion Extension AROM at Pool Wall  - 1 x daily - 7 x weekly - 2 sets - 10 reps - Plank with Hip Extension at [employer]  - 1 x daily - 7  x weekly - 2 sets - 10 reps - Side to Side Hamstring Stretch with Noodle at Pool Wall  - 1 x daily - 7 x weekly - 1 sets - 2-3 reps - 30-60 seconds hold - Lobbyist with Noodle at [employer]  - 1 x daily - 7 x weekly - 1 sets - 2-3 reps - 30-60 seconds hold  GOALS: Goals reviewed with patient? Yes  SHORT TERM GOALS: Target date: 10/10/2023   Pt will be independent with initial HEP for improved strength, balance, transfers and gait.  Baseline: Goal status: MET  2.  FGA to be assessed and LTG updated  Baseline:  Goal status: MET  3.  Pt will improve gait velocity to at least 2.5 ft/s w/LRAD for improved gait efficiency and independence   Baseline: 2.15 ft/s no AD; 2.41 ft/s no AD  Goal status: PARTIALLY MET   4.  Pt will improve 5 x STS to less than or equal to 25 seconds to demonstrate improved functional strength and transfer efficiency.   Baseline: 30.79s; 22.34s w/hands braced on knees  Goal status: MET   LONG TERM GOALS: Target date: 11/07/2023 (updated for extended POC)    Pt will be independent with final HEP for improved strength, balance, transfers and gait.  Baseline:  Goal status: INITIAL  2.  Pt  will improve FGA to 22/30 for decreased fall risk   Baseline: 18/30 Goal status: REVISED  3.  Pt will improve gait velocity to at least 2.7 ft/s w/LRAD for improved gait efficiency and stability   Baseline: 2.15 ft/s no AD; 2.41 ft/s no AD (6/30) Goal status: REVISED  4.  Pt will improve 5 x STS to less than or equal to 20 seconds to demonstrate improved functional strength and transfer efficiency.   Baseline: 30.79s  Goal status: INITIAL   ASSESSMENT:  CLINICAL IMPRESSION: Focus of skilled session on reviewing frequently used pool tasks to establish comfortable HEP.  She has tolerated aquatic environment well and this PT feels it has supported her land progress well.  She has reached a functional maximum in this setting.  Will provide laminated copy of aquatic HEP at next land visit.  Continue per POC.  OBJECTIVE IMPAIRMENTS: Abnormal gait, decreased activity tolerance, decreased balance, decreased cognition, decreased endurance, decreased knowledge of use of DME, decreased mobility, difficulty walking, decreased ROM, decreased strength, decreased safety awareness, impaired sensation, impaired UE functional use, improper body mechanics, postural dysfunction, and pain  ACTIVITY LIMITATIONS: carrying, lifting, bending, standing, squatting, stairs, locomotion level, and caring for others  PARTICIPATION LIMITATIONS: cleaning, interpersonal relationship, driving, shopping, community activity, occupation, and yard work  PERSONAL FACTORS: Behavior pattern, Fitness, Transportation, and 1-2 comorbidities: L knee OA and sciatica are also affecting patient's functional outcome.   REHAB POTENTIAL: Good  CLINICAL DECISION MAKING: Evolving/moderate complexity  EVALUATION COMPLEXITY: Moderate  PLAN:  PT FREQUENCY: 1-2x/week  PT DURATION: 6 weeks + 4 weeks (recert)   PLANNED INTERVENTIONS: 02835- PT Re-evaluation, 97750- Physical Performance Testing, 97110-Therapeutic exercises, 97530-  Therapeutic activity, [id number]- Neuromuscular re-education, 97535- Self Care, [id number]- Manual therapy, [phone]- Gait training, [phone]- Aquatic Therapy, Patient/Family education, Balance training, Stair training, Dry Needling, Joint mobilization, Spinal mobilization, Vestibular training, and DME instructions  PLAN FOR NEXT SESSION:   LAND: Monitor vitals. Add to HEP prn. Single leg stability, eccentric quad strength, posterior chain strength, spanish squats, long sitting hip flexion    Daved KATHEE Bull, PT, DPT 11/01/2023, 7:23 AM

## 2023-11-01 NOTE — Therapy (Signed)
 OUTPATIENT PHYSICAL THERAPY NEURO TREATMENT - DISCHARGE SUMMARY   Patient Name: Margaret Owens MRN: [id number] DOB:11-05-1970, 53 y.o., female Today's Date: 11/01/2023   PCP: Shelda Atlas, MD REFERRING PROVIDER: Carilyn Prentice FORBES, MD  PHYSICAL THERAPY DISCHARGE SUMMARY  Visits from Start of Care: 12  Current functional level related to goals / functional outcomes: Mod I w/all ADLs w/occasional use of cane or stroller due to L knee pain    Remaining deficits: L knee pain, LLE weakness, low fall risk    Education / Equipment: Land and aquatic HEP   Patient agrees to discharge. Patient goals were met. Patient is being discharged due to meeting the stated rehab goals.     END OF SESSION:  PT End of Session - 11/01/23 1407     Visit Number 12    Number of Visits 14   Recert   Date for PT Re-Evaluation 11/15/23   Recert   Authorization Type  Medicaid    PT Start Time 1405    PT Stop Time 1446    PT Time Calculation (min) 41 min    Activity Tolerance Patient tolerated treatment well    Behavior During Therapy WFL for tasks assessed/performed          Past Medical History:  Diagnosis Date   Anxiety    Arthritis    Asthma    Bursitis    Depression    GERD (gastroesophageal reflux disease)    Hypertension    Muscle spasms of both lower extremities    PTSD (post-traumatic stress disorder)    Sciatica    Seizures (HCC)    Uterine fibroid    Past Surgical History:  Procedure Laterality Date   colonoscopy     Patient Active Problem List   Diagnosis Date Noted   Gait disorder 08/19/2023   Positive ANA (antinuclear antibody) 12/03/2021   Urticaria 12/03/2021   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 12/03/2021   Spinal stenosis of lumbar region with radiculopathy 01/23/2021   COVID-19 vaccination declined 05/16/2020   Prediabetes 05/16/2020   Obesity (BMI 35.0-39.9 without comorbidity) 05/16/2020   Primary osteoarthritis of left  knee 07/04/2018   Uterine fibroid 06/09/2017   Endometrial stromal nodule 06/09/2017    ONSET DATE: 08/19/2023 (referral)  REFERRING DIAG: R26.9 (ICD-10-CM) - Gait disorder  THERAPY DIAG:  Muscle weakness (generalized)  Chronic pain of left knee  Other abnormalities of gait and mobility  Rationale for Evaluation and Treatment: Rehabilitation  SUBJECTIVE:  SUBJECTIVE STATEMENT: Pt presents w/o AD. Reports her knee is a bit better today, has not been walking so much on it. No falls. HEP is going well.    Pt accompanied by: self  PERTINENT HISTORY: anxiety, GERD, HTN, PTSD, sciatica, seizures  PAIN:  Are you having pain? Yes: NPRS scale: 3/10 Pain location: L knee Pain description: Achy/throbbing  Aggravating factors: Walking Relieving factors: Oxycodone , Voltaren    PRECAUTIONS: Fall  RED FLAGS: None   WEIGHT BEARING RESTRICTIONS: No  FALLS: Has patient fallen in last 6 months? No  LIVING ENVIRONMENT: Lives with: lives with their daughter Lives in: House/apartment Stairs: No Has following equipment at home: Counselling psychologist, Environmental consultant - 2 wheeled, Marine scientist  PLOF: Independent  PATIENT GOALS: to learn some things to build up my muscle mass and strengthen my legs so I can be balanced more   OBJECTIVE:  Note: Objective measures were completed at Evaluation unless otherwise noted.  DIAGNOSTIC FINDINGS: MRI of lumbar spine from 08/2020  IMPRESSION: 1. Degenerative changes of the lumbar spine with progressed anterolisthesis at L4-5 where there is mild-to-moderate spinal canal stenosis with narrowing of the bilateral subarticular zones, severe right and moderate left neural foraminal narrowing. 2. Moderate right and severe left neural foraminal narrowing at L5-S1,  unchanged.  COGNITION: Overall cognitive status: Difficulty to assess due to: no family present   SENSATION: Pt reports occasional numbness in LLE and frequent tingling in bilateral hands    POSTURE: rounded shoulders, forward head, and posterior pelvic tilt  LOWER EXTREMITY ROM:     Active  Right Eval Left Eval  Hip flexion    Hip extension    Hip abduction    Hip adduction    Hip internal rotation    Hip external rotation    Knee flexion    Knee extension  Lacking full ROM due to pain, not formally assessed  Ankle dorsiflexion    Ankle plantarflexion    Ankle inversion    Ankle eversion     (Blank rows = not tested)  LOWER EXTREMITY MMT:  Tested in seated position   MMT Right Eval Left Eval  Hip flexion 4- 4-  Hip extension    Hip abduction 3+ 3+  Hip adduction 3+ 3+  Hip internal rotation    Hip external rotation    Knee flexion 4+ 3+  Knee extension 4+ 3+  Ankle dorsiflexion 5 5  Ankle plantarflexion    Ankle inversion    Ankle eversion    (Blank rows = not tested)  BED MOBILITY:  Not tested Pt reports independence w/this   TRANSFERS: Sit to stand: Modified independence  Assistive device utilized: None     Stand to sit: Modified independence  Assistive device utilized: None      RAMP:  Not tested  CURB:  Not tested  STAIRS: Not tested GAIT: Gait pattern: step through pattern, decreased stride length, knee flexed in stance- Left, scissoring, antalgic, lateral hip instability, lateral lean- Left, and decreased trunk rotation Distance walked: Various clinic distances  Assistive device utilized: None Level of assistance: Modified independence Comments: No instability noted this date, pt reports her knee is doing better today   VITALS  There were no vitals filed for this visit.  TREATMENT:   Physical Performance - LTG Assessment   [facility] PT  Assessment - 11/01/23 1411       Transfers   Five time sit to stand comments  19.5s   Hands on knees, full hip extension     Ambulation/Gait   Gait velocity 32.8' over 10.38s = 3.16 ft/s   no AD, complete independence     Functional Gait  Assessment   Gait assessed  Yes    Gait Level Surface Walks 20 ft, slow speed, abnormal gait pattern, evidence for imbalance or deviates 10-15 in outside of the 12 in walkway width. Requires more than 7 sec to ambulate 20 ft.   7.22s   Change in Gait Speed Able to smoothly change walking speed without loss of balance or gait deviation. Deviate no more than 6 in outside of the 12 in walkway width.    Gait with Horizontal Head Turns Performs head turns smoothly with no change in gait. Deviates no more than 6 in outside 12 in walkway width    Gait with Vertical Head Turns Performs head turns with no change in gait. Deviates no more than 6 in outside 12 in walkway width.    Gait and Pivot Turn Pivot turns safely within 3 sec and stops quickly with no loss of balance.    Step Over Obstacle Is able to step over 2 stacked shoe boxes taped together (9 in total height) without changing gait speed. No evidence of imbalance.    Gait with Narrow Base of Support Is able to ambulate for 10 steps heel to toe with no staggering.    Gait with Eyes Closed Walks 20 ft, slow speed, abnormal gait pattern, evidence for imbalance, deviates 10-15 in outside 12 in walkway width. Requires more than 9 sec to ambulate 20 ft.   10.84s   Ambulating Backwards Walks 20 ft, slow speed, abnormal gait pattern, evidence for imbalance, deviates 10-15 in outside 12 in walkway width.   25.84s   Steps Alternating feet, must use rail.    Total Score 23    FGA comment: medium fall risk         Ther Act  Per pt request, SciFit multi-peaks level 9.5 for 10 minutes using BUE/BLEs for neural priming for reciprocal movement, dynamic cardiovascular warmup and increased amplitude of stepping. RPE of  6/10 following activity  Educated pt on returning to PT in ~12 weeks if needed due to impaired balance or knee pain. Informed pt of visit limit so recommend spreading visits out throughout the year, which she verbalized understanding.  Verbally reviewed both land and aquatic HEP and pt verbalized good understanding of all exercises. Recommended pt perform all supine and quadruped land exercises on her bed, not the floor.      PATIENT EDUCATION: Education details: Laminated aquatic HEP, goal results, returning to PT later in year if needed   Person educated: Patient Education method: Explanation, Demonstration, and Handouts Education comprehension: verbalized understanding, returned demonstration, and needs further education  HOME EXERCISE PROGRAM: Access Code: [email or web address] URL: [email or web address] Date: 09/20/2023 Prepared by: Marlon Hannan Hutmacher  Exercises - Side Stepping with Resistance at Thighs and Counter Support  - 1 x daily - 7 x weekly - 3 sets - 10 reps - Forward Backward Monster Walk with Band at [employer] and Counter Support  - 1 x daily - 7 x weekly - 3 sets - 10 reps - Seated Knee Extension with Resistance  - 1 x daily - 7 x weekly -  3 sets - 10 reps - Sit to Stand Without Arm Support  - 1 x daily - 7 x weekly - 3 sets - 10 reps - Supine Bridge  - 1 x daily - 7 x weekly - 3 sets - 10 reps - Clam with Resistance  - 1 x daily - 7 x weekly - 2 sets - 12 reps - Hip Extension with Resistance Loop  - 1 x daily - 7 x weekly - 3 sets - 10 reps - Bird Dog  - 1 x daily - 7 x weekly - 3 sets - 10 reps - Marching with Resistance  - 1 x daily - 7 x weekly - 3 sets - 10 reps - Standing Retro Step-Down Heel Tap  - 1 x daily - 7 x weekly - 3 sets - 10 reps  AQUATIC HEP: Access Code: [email or web address] URL: [email or web address] Date: 11/01/2023 Prepared by: Daved Bull  Exercises - Standing March at [facility]  - 1 x daily - 7 x weekly - 1 sets - 10 reps - Squat  - 1 x  daily - 7 x weekly - 1 sets - 10 reps - Side Stepping  - 1 x daily - 7 x weekly - 3 sets - 10 reps - Forward Walking  - 1 x daily - 7 x weekly - 3 sets - 10 reps - Backward Walking  - 1 x daily - 7 x weekly - 3 sets - 10 reps - Bilateral Shoulder Flexion Extension AROM at Pool Wall  - 1 x daily - 7 x weekly - 2 sets - 10 reps - Plank with Hip Extension at [employer]  - 1 x daily - 7 x weekly - 2 sets - 10 reps - Side to Side Hamstring Stretch with Noodle at [employer]  - 1 x daily - 7 x weekly - 1 sets - 2-3 reps - 30-60 seconds hold - Lobbyist with Noodle at [employer]  - 1 x daily - 7 x weekly - 1 sets - 2-3 reps - 30-60 seconds hold  GOALS: Goals reviewed with patient? Yes  SHORT TERM GOALS: Target date: 10/10/2023   Pt will be independent with initial HEP for improved strength, balance, transfers and gait.  Baseline: Goal status: MET  2.  FGA to be assessed and LTG updated  Baseline:  Goal status: MET  3.  Pt will improve gait velocity to at least 2.5 ft/s w/LRAD for improved gait efficiency and independence   Baseline: 2.15 ft/s no AD; 2.41 ft/s no AD  Goal status: PARTIALLY MET   4.  Pt will improve 5 x STS to less than or equal to 25 seconds to demonstrate improved functional strength and transfer efficiency.   Baseline: 30.79s; 22.34s w/hands braced on knees  Goal status: MET   LONG TERM GOALS: Target date: 11/07/2023 (updated for extended POC)    Pt will be independent with final HEP for improved strength, balance, transfers and gait.  Baseline:  Goal status: MET  2.  Pt will improve FGA to 22/30 for decreased fall risk   Baseline: 18/30; 23/30 (7/21) Goal status: MET  3.  Pt will improve gait velocity to at least 2.7 ft/s w/LRAD for improved gait efficiency and stability   Baseline: 2.15 ft/s no AD; 2.41 ft/s no AD (6/30); 3.16 ft/s no AD (7/21)  Goal status: MET  4.  Pt will improve 5 x STS to less than or equal to 20 seconds to demonstrate improved  functional strength and transfer efficiency.   Baseline: 30.79s; 19.5s (7/21)  Goal status: MET   ASSESSMENT:  CLINICAL IMPRESSION: Emphasis of skilled PT session on LTG assessment and DC from PT. Pt has met all 4 LTGs, with significant improvements noted in her gait speed w/no AD and her 5x STS without UE support, indicative of improved BLE strength and endurance and lowering her fall risk. Pt scored 23/30 on FGA and lost points due to slowed speed rather than instability throughout assessment. Pt overall doing well and is only using an AD when her L knee pain flares up on her to improve comfort when walking. Pt verbalized good understanding of her aquatic and land HEP and verbalized readiness to DC from PT this date.   OBJECTIVE IMPAIRMENTS: Abnormal gait, decreased activity tolerance, decreased balance, decreased cognition, decreased endurance, decreased knowledge of use of DME, decreased mobility, difficulty walking, decreased ROM, decreased strength, decreased safety awareness, impaired sensation, impaired UE functional use, improper body mechanics, postural dysfunction, and pain  ACTIVITY LIMITATIONS: carrying, lifting, bending, standing, squatting, stairs, locomotion level, and caring for others  PARTICIPATION LIMITATIONS: cleaning, interpersonal relationship, driving, shopping, community activity, occupation, and yard work  PERSONAL FACTORS: Behavior pattern, Fitness, Transportation, and 1-2 comorbidities: L knee OA and sciatica are also affecting patient's functional outcome.   REHAB POTENTIAL: Good  CLINICAL DECISION MAKING: Evolving/moderate complexity  EVALUATION COMPLEXITY: Moderate  PLAN:  PT FREQUENCY: 1-2x/week  PT DURATION: 6 weeks + 4 weeks (recert)   PLANNED INTERVENTIONS: 02835- PT Re-evaluation, 97750- Physical Performance Testing, 97110-Therapeutic exercises, 97530- Therapeutic activity, [id number]- Neuromuscular re-education, 97535- Self Care, [id number]- Manual therapy,  [phone]- Gait training, [phone]- Aquatic Therapy, Patient/Family education, Balance training, Stair training, Dry Needling, Joint mobilization, Spinal mobilization, Vestibular training, and DME instructions   Marlon BRAVO Quanell Loughney, PT, DPT 11/01/2023, 2:47 PM

## 2023-11-01 NOTE — Patient Instructions (Signed)
 Access Code: [email or web address] URL: [email or web address] Date: 11/01/2023 Prepared by: Daved Bull  Exercises - Standing March at [facility]  - 1 x daily - 7 x weekly - 1 sets - 10 reps - Squat  - 1 x daily - 7 x weekly - 1 sets - 10 reps - Side Stepping  - 1 x daily - 7 x weekly - 3 sets - 10 reps - Forward Walking  - 1 x daily - 7 x weekly - 3 sets - 10 reps - Backward Walking  - 1 x daily - 7 x weekly - 3 sets - 10 reps - Bilateral Shoulder Flexion Extension AROM at Pool Wall  - 1 x daily - 7 x weekly - 2 sets - 10 reps - Plank with Hip Extension at [employer]  - 1 x daily - 7 x weekly - 2 sets - 10 reps - Side to Side Hamstring Stretch with Noodle at [employer]  - 1 x daily - 7 x weekly - 1 sets - 2-3 reps - 30-60 seconds hold - Lobbyist with Noodle at [employer]  - 1 x daily - 7 x weekly - 1 sets - 2-3 reps - 30-60 seconds hold

## 2023-11-02 ENCOUNTER — Encounter: Payer: Self-pay | Admitting: Occupational Therapy

## 2023-11-02 ENCOUNTER — Ambulatory Visit: Admitting: Occupational Therapy

## 2023-11-02 DIAGNOSIS — M79641 Pain in right hand: Secondary | ICD-10-CM

## 2023-11-02 DIAGNOSIS — R29898 Other symptoms and signs involving the musculoskeletal system: Secondary | ICD-10-CM

## 2023-11-02 DIAGNOSIS — M6281 Muscle weakness (generalized): Secondary | ICD-10-CM

## 2023-11-02 DIAGNOSIS — M79642 Pain in left hand: Secondary | ICD-10-CM

## 2023-11-02 DIAGNOSIS — R2689 Other abnormalities of gait and mobility: Secondary | ICD-10-CM | POA: Diagnosis not present

## 2023-11-02 DIAGNOSIS — R208 Other disturbances of skin sensation: Secondary | ICD-10-CM

## 2023-11-02 NOTE — Patient Instructions (Signed)
 AROM: Wrist Extension    With right palm down, bend wrist up and then down slowly. Repeat _10___ times per set. Do __2__ sessions per day.   KEEP WRIST NEUTRAL AND SUPPORTED FOR BELOW THUMB EXERCISES  Opposition (Active)   Touch tip of thumb to nail tip of each finger in turn, making an O shape. Repeat __5__ times. Do _3__ sessions per day.   AROM: Thumb Abduction / Adduction    Actively pull right thumb away from palm as far as possible.  Then bring thumb back to touch fingers. Try not to bend fingers toward thumb. Repeat _10-15___ times per set.  Do __4-6__ sessions per day.    MP Flexion (Active)   Bend thumb to touch base of little finger, keeping tip joint straight. Repeat __10-15__ times. Do _4-6___ sessions per day    Composite Extension (Active)   Bring thumb up and out in hitchhiker position.  Repeat __10-15__ times. Do _4-6___ sessions per day.

## 2023-11-02 NOTE — Therapy (Signed)
 OUTPATIENT OCCUPATIONAL THERAPY NEURO TREATMENT  Patient Name: Margaret Owens MRN: [id number] DOB:02/13/1971, 53 y.o., female 44 Date: 11/02/2023  PCP: Shelda Atlas, MD REFERRING PROVIDER: Carilyn Prentice FORBES, MD  END OF SESSION:  OT End of Session - 11/02/23 1237     Visit Number 3    Number of Visits 16    Date for OT Re-Evaluation 12/19/23    Authorization Type [employer] - Approved 16 O.T. visits    Authorization Time Period 10/27/23 - 12/29/23    Authorization - Visit Number 2    Authorization - Number of Visits 16    OT Start Time 1235    OT Stop Time 1315    OT Time Calculation (min) 40 min    Activity Tolerance Patient tolerated treatment well    Behavior During Therapy WFL for tasks assessed/performed          Past Medical History:  Diagnosis Date   Anxiety    Arthritis    Asthma    Bursitis    Depression    GERD (gastroesophageal reflux disease)    Hypertension    Muscle spasms of both lower extremities    PTSD (post-traumatic stress disorder)    Sciatica    Seizures (HCC)    Uterine fibroid    Past Surgical History:  Procedure Laterality Date   colonoscopy     Patient Active Problem List   Diagnosis Date Noted   Gait disorder 08/19/2023   Positive ANA (antinuclear antibody) 12/03/2021   Urticaria 12/03/2021   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 12/03/2021   Spinal stenosis of lumbar region with radiculopathy 01/23/2021   COVID-19 vaccination declined 05/16/2020   Prediabetes 05/16/2020   Obesity (BMI 35.0-39.9 without comorbidity) 05/16/2020   Primary osteoarthritis of left knee 07/04/2018   Uterine fibroid 06/09/2017   Endometrial stromal nodule 06/09/2017    ONSET DATE: 09/13/2023 (referral date)   REFERRING DIAG: M79.641,M79.642 (ICD-10-CM) - Bilateral hand pain  THERAPY DIAG:  Muscle weakness (generalized)  Other disturbances of skin sensation  Other symptoms and signs involving the musculoskeletal  system  Pain in right hand  Pain in left hand  Rationale for Evaluation and Treatment: Rehabilitation  SUBJECTIVE:   SUBJECTIVE STATEMENT: Pt reports Rt thumb pain worse than Lt thumb. Also reports Lt long and ring finger catching - ? Trigger finger Pt accompanied by: self  PERTINENT HISTORY: anxiety, GERD, HTN, PTSD, sciatica, seizures  PRECAUTIONS: None  WEIGHT BEARING RESTRICTIONS: No  PAIN:  Are you having pain? Yes bilateral hands 3/10 Rt thumb worse than Lt, Lt ring and long finger  FALLS: Has patient fallen in last 6 months? No  Lives with: lives with their daughter Lives in: first floor apt Stairs: No Has following equipment at home: Counselling psychologist, Environmental consultant - 2 wheeled, and Shower bench Has following equipment at home: Single point cane and rollator  PLOF: Independent with basic ADLs and filing for disability  PATIENT GOALS: decrease hand pain and increase strength  OBJECTIVE:  Note: Objective measures were completed at Evaluation unless otherwise noted.  DIAGNOSTIC FINDINGS: MRI of lumbar spine from 08/2020   IMPRESSION: 1. Degenerative changes of the lumbar spine with progressed anterolisthesis at L4-5 where there is mild-to-moderate spinal canal stenosis with narrowing of the bilateral subarticular zones, severe right and moderate left neural foraminal narrowing. 2. Moderate right and severe left neural foraminal narrowing at L5-S1, unchanged.  HAND DOMINANCE: Right  ADLs: Eating: independent  Grooming: independent UB Dressing:  independent  LB Dressing: independent  Toileting: independent Bathing: independent Tub Shower transfers: mod I w/ shower chair Equipment: Shower seat with back  IADLs: Shopping: uses Field seismologist (takes public transportation)  [employer]: pt does Meal Prep: independent Community mobility: pt does not currently drive (d/t finances)  Medication management: independent Handwriting: denies  change  MOBILITY STATUS: sometimes walks with cane   FUNCTIONAL OUTCOME MEASURES: Quick Dash: 41% deficit  UPPER EXTREMITY ROM:  BUE AROM WFL's - more difficulty making tight fist with Lt hand   UPPER EXTREMITY MMT:   WFL's  HAND FUNCTION: Grip strength: Right: 28.2 lbs; Left: 7 lbs,  Lateral pinch: Right: 6 lbs, Left: 4 lbs, and  3 point pinch: Right: 2 lbs, Left: 4 lbs  COORDINATION: 9 Hole Peg test: Right: 25.77 sec; Left: 26.51 sec  SENSATION: Numbness Lt long and ring fingers (volar and dorsal)   EDEMA: mild at joints (Rt > Lt long finger DIP joint w/ arthritic changes)   COGNITION: Overall cognitive status: WFL's, very verbose t/o evaluation and needs redirection  VISION: Subjective report: no changes Baseline vision: Wears glasses for reading only Visual history: none   PERCEPTION: Not tested  PRAXIS: Not tested  OBSERVATIONS: +Finklesteins for DeQuervains tenosynovitis Rt side, but also arthritic changes to Rt CMC joint; lesser Lt CMC joint. Pt also reports catching Lt long and ring finger (? Trigger finger) however did not see during evaluation as it doesn't always catch. Pt reports notices more after tight sustained grip                                                                                                                              TREATMENT DATE: 11/02/23   MCD authorization approved  Pt issued A/ROM HEP for bilateral hands and Rt wrist - see pt instructions for details.   Pt also shown different splint/brace options for Rt thumb/wrist support and Lt ring finger triggering and provided handouts on where/how to purchase. Placed bandaid over proximal phalanx of Lt ring finger to reduce symptoms of triggering. Pt instructed to remove before bedtime.     PATIENT EDUCATION: Education details: SEE ABOVE Person educated: Patient Education method: Explanation Education comprehension: verbalized understanding  HOME EXERCISE PROGRAM: 10/27/23:  joint protection techniques, A/E recommendations 11/02/23: A/ROM HEP, brace/splint recommendations   GOALS: Goals reviewed with patient? Yes  SHORT TERM GOALS: Target date: 11/18/23  Pt to verbalize understanding with joint protection techniques, task modifications, and possible A/E needs to reduce pain and protect joints Baseline: not yet addressed/issued Goal status: MET  2.  Pt to verbalize understanding with EC techniques and sleep positioning techniques Baseline: not yet addressed/issued Goal status: IN PROGRESS  3.  Pt to be independent with ROM HEP (thumb separate from wrist) Baseline: not yet addressed/issued Goal status: MET   4.  Pain to reduce down to 8/10 or less bilateral hands Baseline: 10/10  Goal status: IN PROGRESS    LONG TERM  GOALS: Target date: 12/19/23  Independent with updated strengthening HEP for bilateral hands Baseline: not yet addressed/issued Goal status: INITIAL  2.  Pt to improve Rt grip by 5 lbs and Lt grip by 10 lbs or more to improve function and ease with opening tight jars/containers Baseline: Rt = 28 lbs, Lt = 7 lbs Goal status: INITIAL  3.  Pt to improve lateral pinch strength bilaterally by 2 lbs or greater Baseline: Rt = 6 lbs, Lt = 4 lbs Goal status: INITIAL  4.  Pt to improve 3 tip pinch strength bilaterally by 2 lbs or greater Baseline: Rt = 2 lbs, Lt = 4 lbs Goal status: INITIAL  5.  Pt to improve BUE deficits as evidenced by reducing deficit on Quick Dash by 10% Baseline: 41% deficit Goal status: INITIAL   ASSESSMENT:  CLINICAL IMPRESSION: Patient seen today for occupational therapy treatment for bilateral hand pain. Pt has arthritic changes in joints, as well as what appears to be DeQuervains tenosynovitis Rt thumb and possible trigger finger Lt long and ring fingers.  Pt with greater understanding of A/E needs, joint protection techniques and task modifications to reduce symptoms and protect joints. Pt also aware of  different splint options .   PERFORMANCE DEFICITS: in functional skills including ADLs, IADLs, coordination, dexterity, sensation, edema, ROM, strength, pain, Fine motor control, body mechanics, decreased knowledge of use of DME, and UE functional use.   IMPAIRMENTS: are limiting patient from ADLs, IADLs, rest and sleep, and leisure.   CO-MORBIDITIES: may have co-morbidities  that affects occupational performance. Patient will benefit from skilled OT to address above impairments and improve overall function.  MODIFICATION OR ASSISTANCE TO COMPLETE EVALUATION: No modification of tasks or assist necessary to complete an evaluation.  OT OCCUPATIONAL PROFILE AND HISTORY: Detailed assessment: Review of records and additional review of physical, cognitive, psychosocial history related to current functional performance.  CLINICAL DECISION MAKING: Moderate - several treatment options, min-mod task modification necessary  REHAB POTENTIAL: Good  EVALUATION COMPLEXITY: Moderate    PLAN:  OT FREQUENCY: 1-2x/week  OT DURATION: 8 weeks  PLANNED INTERVENTIONS: 97535 self care/ADL training, 02889 therapeutic exercise, 97530 therapeutic activity, 97140 manual therapy, 97113 aquatic therapy, 97035 ultrasound, 97018 paraffin, 02960 fluidotherapy, 97010 moist heat, 97010 cryotherapy, 97034 contrast bath, 97760 Orthotic Initial, 97763 Orthotic/Prosthetic subsequent, passive range of motion, patient/family education, and DME and/or AE instructions  RECOMMENDED OTHER SERVICES: none at this time - currently seeing P.T.   CONSULTED AND AGREED WITH PLAN OF CARE: Patient  PLAN FOR NEXT SESSION: follow up on recommendations, progress towards remaining STG's, consider K-taping Rt thumb/wrist   For all possible CPT codes, reference the Planned Interventions line above.     Check all conditions that are expected to impact treatment: {Conditions expected to impact treatment:Neurological condition and/or  seizures and Psychological or psychiatric disorders   If treatment provided at initial evaluation, no treatment charged due to lack of authorization.        Burnard JINNY Roads, OT 11/02/2023, 12:39 PM

## 2023-11-04 ENCOUNTER — Encounter: Attending: Physical Medicine & Rehabilitation | Admitting: Physical Medicine & Rehabilitation

## 2023-11-04 ENCOUNTER — Encounter: Payer: Self-pay | Admitting: Physical Medicine & Rehabilitation

## 2023-11-04 VITALS — BP 140/63 | HR 67 | Ht 69.0 in | Wt 209.4 lb

## 2023-11-04 DIAGNOSIS — G5603 Carpal tunnel syndrome, bilateral upper limbs: Secondary | ICD-10-CM | POA: Insufficient documentation

## 2023-11-04 DIAGNOSIS — M180 Bilateral primary osteoarthritis of first carpometacarpal joints: Secondary | ICD-10-CM | POA: Insufficient documentation

## 2023-11-04 NOTE — Patient Instructions (Signed)
 Pinched Nerve in the Wrist (Carpal Tunnel Syndrome): What to Know  Pinched nerve in the wrist (carpal tunnel syndrome, or CTS) is a nerve problem that causes pain, numbness, and weakness in the wrist, hand, and fingers. The carpal tunnel is a narrow space that is on the palm side of your wrist. Repeated wrist motions or certain diseases may cause swelling in the tunnel. This swelling can pinch the main nerve in the wrist (the median nerve). What are the causes? CTS may be caused by: Moving your hand and wrist over and over again while doing a task. Hurting the wrist. Arthritis. A pocket of fluid (cyst) or a growth (tumor) in the carpal tunnel. Fluid buildup when you are pregnant. Use of tools that vibrate. In some cases, the cause of CTS is not known. What increases the risk? You're more likely to have CTS if: You have a job that makes you do these things: Move your hand firmly over and over again. Work with tools that vibrate, such as drills or sanders. You're female. You have diabetes, obesity, thyroid problems, or kidney failure. What are the signs or symptoms? Symptoms of this condition include: A tingling feeling in your fingers. You may feel this pain in the thumb, index finger, or middle finger. Tingling or loss of feeling in your hand. Pain in your entire arm. This pain may get worse when you bend your wrist and elbow for a long time. Pain in your wrist that goes up your arm to your shoulder. Pain that goes down into your palm or fingers. Weakness in your hands. You may find it hard to grab and hold items. Your symptoms may feel worse during the night. How is this diagnosed? CTS is diagnosed with a medical history and physical exam. Tests and imaging may also be done to: Check the electrical signals sent by your nerves into the muscles. Check how well electrical signals pass through your nerves. Check possible causes of your CTS. These include X-rays, ultrasound, and  MRI. How is this treated? CTS may be treated with: Lifestyle changes. You will be asked to stop or change the activity that caused your problem. Physical therapy. This may include: Exercises that stretch and strengthen the muscles and tendons in the wrist and hand. Nerve gliding or flossing exercises. These help keep nerves moving smoothly through the tissues around them. Occupational therapy. You'll learn how to use your hand again. Medicines for pain and swelling. You may have injections in your wrist. A wrist splint or brace. Surgery. Follow these instructions at home: If you have a splint or brace: Wear the splint or brace as told. Take it off only if your provider says you can. Check the skin around it every day. Tell your provider if you see problems. Loosen the splint or brace if your fingers tingle, are numb, or turn cold and blue. Keep the splint or brace clean and dry. If the splint or brace isn't waterproof: Do not let it get wet. Cover it when you take a bath or shower. Use a cover that doesn't let any water in. Managing pain, stiffness, and swelling  Use ice or an ice pack as told. If you have a splint or brace that you can take off, remove it only as told. Place a towel between your skin and the ice. Leave the ice on for 20 minutes, 2-3 times a day. If your skin turns red, take off the ice right away to prevent skin damage. The risk  of damage is higher if you can't feel pain, heat, or cold. Move your fingers often to reduce stiffness and swelling. General instructions Take your medicines only as told. Rest your wrist and hand from activity that may cause pain. If your CTS is caused by things you do at work, talk with your employer about making changes. For example, you may need a wrist pad to use while typing. Exercise as told. Follow instructions on how to do nerve gliding or flossing exercises. These help keep nerves in moving smoothly through the tissues around  them. Keep all follow-up visits. This is important. Where to find more information American Academy of Orthopedic Surgeons: orthoinfo.aaos.[employer] of Neurological Disorders and Stroke: BasicFM.no Contact a health care provider if: You have new symptoms. Your pain is not controlled with medicines. Your symptoms get worse. Get help right away if: Your hand or wrist tingles or is numb, and the symptoms become very bad. This information is not intended to replace advice given to you by your health care provider. Make sure you discuss any questions you have with your health care provider. Document Revised: 02/09/2023 Document Reviewed: 11/27/2022 Elsevier Patient Education  2024 [employer].

## 2023-11-04 NOTE — Progress Notes (Signed)
 Subjective:    Patient ID: Margaret Owens, female    DOB: Aug 17, 1970, 53 y.o.   MRN: [id number]  HPI 53 year old female with chronic pain syndrome, chronic wrist and hand pain.  She does not bilateral first CMC osteoarthritis.  She was complaining of numbness and tingling in the 4th and 5th digits of the left hand as well as of the thumbs bilaterally.  She had EMG/NCV performed on 10/07/2023 which demonstrated median neuropathy at the wrist affecting both sensory and motor conductions but no denervation on needle EMG.  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  She also had a left ulnar neuropathy at the elbow. She has been going through outpatient OT and has been making very good progress.  She is quite happy with her current level of functioning.  She does have occasional triggering of the left fourth digit. She denies any elbow pain no neck pain. She is having pain medications prescribed by [facility]. Pain Inventory Average Pain 10 Pain Right Now 3 My pain is sharp, burning, stabbing, and aching  In the last 24 hours, has pain interfered with the following? General activity 3 Relation with others 3 Enjoyment of life 3 What TIME of day is your pain at its worst? morning  and night Sleep (in general) Poor  Pain is worse with: walking, bending, sitting, standing, and some activites Pain improves with: heat/ice, therapy/exercise, medication, and injections Relief from Meds: 7  Family History  Problem Relation Age of Onset   Osteoarthritis Mother    Breast cancer Maternal Grandmother        spread to lungs    Healthy Son    Healthy Son    Irregular heart beat Daughter    Asthma Daughter    Social History   Socioeconomic History   Marital status: Single    Spouse name: Not on file   Number of children: Not on file   Years of education: Not on file   Highest education level: Not on file  Occupational History   Not on file  Tobacco  Use   Smoking status: Never    Passive exposure: Past   Smokeless tobacco: Never  Vaping Use   Vaping status: Never Used  Substance and Sexual Activity   Alcohol use: Not Currently    Comment: 1/week   Drug use: No   Sexual activity: Yes    Partners: Male    Birth control/protection: None  Other Topics Concern   Not on file  Social History Narrative   Not on file   Social Drivers of Health   Financial Resource Strain: Not on file  Food Insecurity: Food Insecurity Present (05/21/2023)   Hunger Vital Sign    Worried About Running Out of Food in the Last Year: Sometimes true    Ran Out of Food in the Last Year: Sometimes true  Transportation Needs: No Transportation Needs (05/21/2023)   PRAPARE - Administrator, Civil Service (Medical): No    Lack of Transportation (Non-Medical): No  Physical Activity: Not on file  Stress: Not on file  Social Connections: Not on file   Past Surgical History:  Procedure Laterality Date   colonoscopy     Past Surgical History:  Procedure Laterality Date   colonoscopy     Past Medical History:  Diagnosis Date   Anxiety    Arthritis    Asthma    Bursitis    Depression    GERD (  gastroesophageal reflux disease)    Hypertension    Muscle spasms of both lower extremities    PTSD (post-traumatic stress disorder)    Sciatica    Seizures (HCC)    Uterine fibroid    BP (!) 140/63   Pulse 67   Ht 5' 9 (1.753 m)   Wt 209 lb 6.4 oz (95 kg)   LMP 11/15/2021   SpO2 98%   BMI 30.92 kg/m   Opioid Risk Score:   Fall Risk Score:  `1  Depression screen [facility] 2/9     11/04/2023    1:59 PM 08/19/2023    2:46 PM 08/19/2023    2:40 PM 07/22/2023    1:49 PM 06/24/2023    1:38 PM 05/13/2023    1:49 PM 05/30/2021    2:40 PM  Depression screen PHQ 2/9  Decreased Interest 0 1 0 0 0 0 1  Down, Depressed, Hopeless 0 1 0 0 0 0   PHQ - 2 Score 0 2 0 0 0 0 1     Review of Systems  Musculoskeletal:        Bilateral hands  All other  systems reviewed and are negative.      Objective:   Physical Exam Vitals and nursing note reviewed.  Constitutional:      Appearance: She is obese.  HENT:     Head: Normocephalic and atraumatic.  Eyes:     Extraocular Movements: Extraocular movements intact.     Conjunctiva/sclera: Conjunctivae normal.     Pupils: Pupils are equal, round, and reactive to light.  Musculoskeletal:     Comments: There is pain with extension of the carpometacarpal joint on the right hand as well as the first MCP of the right hand. No evidence of hand deformity. There is no tenderness over the A1 pulley of the left fourth digit no evidence of triggering elicited on examination.  Skin:    General: Skin is warm and dry.  Neurological:     Mental Status: She is alert.     Comments: Sensation intact bilateral C5 C6-C7-C8 dermatomal distribution to pinprick and light touch.   Strength is 5/5 bilateral deltoid bicep tricep grip        Assessment & Plan:  1.  Chronic wrist and hand pain multifactorial.  She does have osteoarthritis of the carpometacarpal joints.  In addition she does have electrodiagnostic evidence of carpal tunnel syndrome as well as left ulnar neuropathy.  At this point she feels like she is doing better with physical therapy and Occupational Therapy.  We discussed the possible next steps if she has an increase of her discomfort.  We could consider carpal tunnel injection with corticosteroid.  At this time we will hold off on this will see her back in 3 months.

## 2023-11-08 ENCOUNTER — Ambulatory Visit: Admitting: Occupational Therapy

## 2023-11-08 ENCOUNTER — Encounter: Payer: Self-pay | Admitting: Occupational Therapy

## 2023-11-08 DIAGNOSIS — R2689 Other abnormalities of gait and mobility: Secondary | ICD-10-CM | POA: Diagnosis not present

## 2023-11-08 DIAGNOSIS — R208 Other disturbances of skin sensation: Secondary | ICD-10-CM

## 2023-11-08 DIAGNOSIS — R29898 Other symptoms and signs involving the musculoskeletal system: Secondary | ICD-10-CM

## 2023-11-08 DIAGNOSIS — M6281 Muscle weakness (generalized): Secondary | ICD-10-CM

## 2023-11-08 DIAGNOSIS — M79641 Pain in right hand: Secondary | ICD-10-CM

## 2023-11-08 DIAGNOSIS — M79642 Pain in left hand: Secondary | ICD-10-CM

## 2023-11-08 NOTE — Therapy (Signed)
 OUTPATIENT OCCUPATIONAL THERAPY NEURO TREATMENT  Patient Name: Margaret Owens MRN: [id number] DOB:1971/01/22, 53 y.o., female 31 Date: 11/08/2023  PCP: Shelda Atlas, MD REFERRING PROVIDER: Carilyn Prentice FORBES, MD  END OF SESSION:  OT End of Session - 11/08/23 1405     Visit Number 4    Number of Visits 16    Date for OT Re-Evaluation 12/19/23    Authorization Type [employer] - Approved 16 O.T. visits    Authorization Time Period 10/27/23 - 12/29/23    Authorization - Number of Visits 16    OT Start Time 1402    OT Stop Time 1453    OT Time Calculation (min) 51 min    Activity Tolerance Patient tolerated treatment well    Behavior During Therapy WFL for tasks assessed/performed          Past Medical History:  Diagnosis Date   Anxiety    Arthritis    Asthma    Bursitis    Depression    GERD (gastroesophageal reflux disease)    Hypertension    Muscle spasms of both lower extremities    PTSD (post-traumatic stress disorder)    Sciatica    Seizures (HCC)    Uterine fibroid    Past Surgical History:  Procedure Laterality Date   colonoscopy     Patient Active Problem List   Diagnosis Date Noted   Gait disorder 08/19/2023   Positive ANA (antinuclear antibody) 12/03/2021   Urticaria 12/03/2021   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 12/03/2021   Spinal stenosis of lumbar region with radiculopathy 01/23/2021   COVID-19 vaccination declined 05/16/2020   Prediabetes 05/16/2020   Obesity (BMI 35.0-39.9 without comorbidity) 05/16/2020   Primary osteoarthritis of left knee 07/04/2018   Uterine fibroid 06/09/2017   Endometrial stromal nodule 06/09/2017    ONSET DATE: 09/13/2023 (referral date)   REFERRING DIAG: M79.641,M79.642 (ICD-10-CM) - Bilateral hand pain  THERAPY DIAG:  Pain in left hand  Pain in right hand  Muscle weakness (generalized)  Other disturbances of skin sensation  Other symptoms and signs involving the  musculoskeletal system  Rationale for Evaluation and Treatment: Rehabilitation  SUBJECTIVE:   SUBJECTIVE STATEMENT: Pain Rt thumb 5/10 today but that's only because I lifted my backpack the wrong way. The Band-Aid helped the Lt ring finger from catching.  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 Also reports Lt long and ring finger catching - ? 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 Yes bilateral hands 3/10 Rt thumb worse than Lt, Lt ring and long finger  FALLS: Has patient fallen in last 6 months? No  Lives with: lives with their daughter Lives in: first floor apt Stairs: No Has following equipment at home: Counselling psychologist, Environmental consultant - 2 wheeled, and Shower bench Has following equipment at home: Single point cane and rollator  PLOF: Independent with basic ADLs and filing for disability  PATIENT GOALS: decrease hand pain and increase strength  OBJECTIVE:  Note: Objective measures were completed at Evaluation unless otherwise noted.  DIAGNOSTIC FINDINGS: MRI of lumbar spine from 08/2020   IMPRESSION: 1. Degenerative changes of the lumbar spine with progressed anterolisthesis at L4-5 where there is mild-to-moderate spinal  canal stenosis with narrowing of the bilateral subarticular zones, severe right and moderate left neural foraminal narrowing. 2. Moderate right and severe left neural foraminal narrowing at L5-S1, unchanged.  HAND DOMINANCE: Right  ADLs: Eating: independent  Grooming: independent UB Dressing: independent  LB Dressing: independent  Toileting: independent Bathing: independent Tub Shower transfers: mod I w/ shower chair Equipment: Shower seat with  back  IADLs: Shopping: uses Field seismologist (takes public transportation)  [employer]: pt does Meal Prep: independent Community mobility: pt does not currently drive (d/t finances)  Medication management: independent Handwriting: denies change  MOBILITY STATUS: sometimes walks with cane   FUNCTIONAL OUTCOME MEASURES: Quick Dash: 41% deficit  UPPER EXTREMITY ROM:  BUE AROM WFL's - more difficulty making tight fist with Lt hand   UPPER EXTREMITY MMT:   WFL's  HAND FUNCTION: Grip strength: Right: 28.2 lbs; Left: 7 lbs,  Lateral pinch: Right: 6 lbs, Left: 4 lbs, and  3 point pinch: Right: 2 lbs, Left: 4 lbs  COORDINATION: 9 Hole Peg test: Right: 25.77 sec; Left: 26.51 sec  SENSATION: Numbness Lt long and ring fingers (volar and dorsal)   EDEMA: mild at joints (Rt > Lt long finger DIP joint w/ arthritic changes)   COGNITION: Overall cognitive status: WFL's, very verbose t/o evaluation and needs redirection  VISION: Subjective report: no changes Baseline vision: Wears glasses for reading only Visual history: none   PERCEPTION: Not tested  PRAXIS: Not tested  OBSERVATIONS: +Finkl[place]ins for DeQuervains tenosynovitis Rt side, but also arthritic changes to Rt CMC joint; lesser Lt CMC joint. Pt also reports catching Lt long and ring finger (? Trigger finger) however did not see during evaluation as it doesn't always catch. Pt reports notices more after tight sustained grip                                                                                                                              TREATMENT DATE: 11/08/23   Noted in pt's chart that last MD note reports bilateral CTS, and NCV back in June did demo mild CTS and Lt ulnar neuropathy at elbow, however pt does not seem to have numbness usually associated with CTS or cubital tunnel syndrome nerve distribution - pt's may complaint is Rt thumb CMC and Lt ring finger catching   However, did K-tape today Rt  thumb/radial wrist to forearm, as well as for CTS RUE and instructed in wear and care.   Pt also shown Lt elbow braces for night time to prevent full elbow flexion and elbow pad to wear during the day as needed.  Reviewed sleep positioning and avoiding sleeping with hands under her to prevent CTS and cubital tunnel symptoms.   Pt also issued CTS conservative treatment handout including: things to avoid and ex's - pt return demo of each   PATIENT EDUCATION: Education details: SEE ABOVE Person educated: Patient Education method: Explanation Education comprehension: verbalized understanding  HOME EXERCISE PROGRAM: 10/27/23: joint  protection techniques, A/E recommendations 11/02/23: A/ROM HEP, brace/splint recommendations 11/08/23: CTS conservative management handout including ex's   GOALS: Goals reviewed with patient? Yes  SHORT TERM GOALS: Target date: 11/18/23  Pt to verbalize understanding with joint protection techniques, task modifications, and possible A/E needs to reduce pain and protect joints Baseline: not yet addressed/issued Goal status: MET  2.  Pt to verbalize understanding with EC techniques and sleep positioning techniques Baseline: not yet addressed/issued Goal status: IN PROGRESS  3.  Pt to be independent with ROM HEP (thumb separate from wrist) Baseline: not yet addressed/issued Goal status: MET   4.  Pain to reduce down to 8/10 or less bilateral hands Baseline: 10/10  Goal status: MET    LONG TERM GOALS: Target date: 12/19/23  Independent with updated strengthening HEP for bilateral hands Baseline: not yet addressed/issued Goal status: INITIAL  2.  Pt to improve Rt grip by 5 lbs and Lt grip by 10 lbs or more to improve function and ease with opening tight jars/containers Baseline: Rt = 28 lbs, Lt = 7 lbs Goal status: INITIAL  3.  Pt to improve lateral pinch strength bilaterally by 2 lbs or greater Baseline: Rt = 6 lbs, Lt = 4 lbs Goal status:  INITIAL  4.  Pt to improve 3 tip pinch strength bilaterally by 2 lbs or greater Baseline: Rt = 2 lbs, Lt = 4 lbs Goal status: INITIAL  5.  Pt to improve BUE deficits as evidenced by reducing deficit on Quick Dash by 10% Baseline: 41% deficit Goal status: INITIAL   ASSESSMENT:  CLINICAL IMPRESSION: Patient seen today for occupational therapy treatment for bilateral hand pain. Pt has arthritic changes in joints, as well as what appears to be DeQuervains tenosynovitis Rt thumb and possible trigger finger Lt long and ring fingers. Pt also with diagnosed CTS bilaterally however does not report numbness.  Pt has met 3/4 STG's at this time. Pain is much improved usually averaging 3-5/10.  SABRA   PERFORMANCE DEFICITS: in functional skills including ADLs, IADLs, coordination, dexterity, sensation, edema, ROM, strength, pain, Fine motor control, body mechanics, decreased knowledge of use of DME, and UE functional use.   IMPAIRMENTS: are limiting patient from ADLs, IADLs, rest and sleep, and leisure.   CO-MORBIDITIES: may have co-morbidities  that affects occupational performance. Patient will benefit from skilled OT to address above impairments and improve overall function.  MODIFICATION OR ASSISTANCE TO COMPLETE EVALUATION: No modification of tasks or assist necessary to complete an evaluation.  OT OCCUPATIONAL PROFILE AND HISTORY: Detailed assessment: Review of records and additional review of physical, cognitive, psychosocial history related to current functional performance.  CLINICAL DECISION MAKING: Moderate - several treatment options, min-mod task modification necessary  REHAB POTENTIAL: Good  EVALUATION COMPLEXITY: Moderate    PLAN:  OT FREQUENCY: 1-2x/week  OT DURATION: 8 weeks  PLANNED INTERVENTIONS: 97535 self care/ADL training, 02889 therapeutic exercise, 97530 therapeutic activity, 97140 manual therapy, 97113 aquatic therapy, 97035 ultrasound, 97018 paraffin, 02960  fluidotherapy, 97010 moist heat, 97010 cryotherapy, 97034 contrast bath, 97760 Orthotic Initial, 97763 Orthotic/Prosthetic subsequent, passive range of motion, patient/family education, and DME and/or AE instructions  RECOMMENDED OTHER SERVICES: none at this time - currently seeing P.T.   CONSULTED AND AGREED WITH PLAN OF CARE: Patient  PLAN FOR NEXT SESSION: address/assess remaining STG, assess if K-tape helped. If tape has fallen off, pt instructed to remove all rings prior to therapy for fluidotherapy. However, if K-tape still on, pt can leave on until Friday 11/12/23.  ?  If she ordered any braces that were recommended (Rt thumb/wrist, Lt elbow)    For all possible CPT codes, reference the Planned Interventions line above.     Check all conditions that are expected to impact treatment: {Conditions expected to impact treatment:Neurological condition and/or seizures and Psychological or psychiatric disorders   If treatment provided at initial evaluation, no treatment charged due to lack of authorization.        Burnard JINNY Roads, OT 11/08/2023, 3:10 PM

## 2023-11-10 ENCOUNTER — Ambulatory Visit: Admitting: Occupational Therapy

## 2023-11-10 DIAGNOSIS — M79642 Pain in left hand: Secondary | ICD-10-CM

## 2023-11-10 DIAGNOSIS — M6281 Muscle weakness (generalized): Secondary | ICD-10-CM

## 2023-11-10 DIAGNOSIS — R29898 Other symptoms and signs involving the musculoskeletal system: Secondary | ICD-10-CM

## 2023-11-10 DIAGNOSIS — R2689 Other abnormalities of gait and mobility: Secondary | ICD-10-CM | POA: Diagnosis not present

## 2023-11-10 DIAGNOSIS — R208 Other disturbances of skin sensation: Secondary | ICD-10-CM

## 2023-11-10 DIAGNOSIS — M79641 Pain in right hand: Secondary | ICD-10-CM

## 2023-11-10 NOTE — Patient Instructions (Addendum)
1    Energy Conservation  What is Patent attorney?  After being in the hospital, it is normal to feel tired and weak. You may also feel short of breath and have less energy to do the activities you are used to doing at home. Learning how to conserve your energy helps you build up your strength to take part in your daily activities and other things you enjoy doing.  When you learn to conserve energy, you also reduce strain on your heart, fatigue, shortness of breath and stress related pain.  Learning to conserve your energy is all about finding a good balance between work, rest and leisure in order to decrease the amount of energy demand on your body.   Remember and Practice the 4 Ps  1. Prioritize  Decide what needs to be done today and what can be done at a later date or time. For example, going to a doctor's appointment would take priority over dusting the living room.  When you have more than one thing to do, begin with the most important to make sure it gets done.   2. Plan  Plan your activities first to avoid extra trips. Gather the supplies and  equipment you need before doing the job. For example, get your  garden supplies and tools ready before you start to plant flowers.  Plan to alternate heavy and light tasks.  Plan activities throughout the week to avoid doing too many activities in one day. Put a schedule on the refrigerator to remind you and others who is doing what.  Plan to get a good rest each night.  Use family and friends or pay for help to complete tasks you may struggle with or that require too much energy.   3. Pace  Maintain a slow and steady pace. Never rush.  2   3. Pace (continued)  Rest often. Rest before you feel tired.  Avoid holding your breath. Practice breathing slow and steady.  Use pursed lip breathing. Breathe in through your nose for a count of 2 and out from your mouth for a count of 4. This is like blowing out a candle on a cake.  Remember  that you may have to ask for help to do some tasks and that is okay.  Listen to your body and know your limits.   4. Position  Too much bending and reaching can cause fatigue and shortness of breath. Use a reacher, sock aid, long handled shoe horn and/or  elastic shoelaces. Avoid bending and reaching too much.  Always maintain a nice upright posture when sitting and  standing. This helps you get more oxygen into your lungs and  around your body to work better.  Sit when you can. Sitting supports your body so you can focus  on your breathing and activities while conserving your energy.  Sitting reduces energy use by 25%.   Energy Conservation Tips  Dressing and Hygiene  Sit when you can.  Organize and lay out clothing the night before.  Begin dressing your lower half first as this uses more energy.  Avoid bending and reaching. Instead, use a reacher, sock aid  or long handled shoe horn or lift your legs up onto the bed or chair.  Dry off with terry cloth robe. You use less energy than drying off with a towel.  If you have a weaker limb or limbs, it is easier to dress the weaker limb first. It is easier to undress your  strong limb first.  Wear clothes that are easy to put on and take off. For example, use clothes and shoes with velcro instead of small buttons, clasps or laces.  3    Avoid using scented products such as hair products and lotions. These can irritate your lungs and cause shortness of breath for you and others around you. Many people are allergic to scents. These types of products are not allowed in the hospital.  Be cautious when bathing. Use warm, not hot water. This helps eliminate shortness of breath from a buildup of steam and condensation.  Use the bathroom equipment suggested by your Occupational Therapist. For example using a bath bench, bath stool, grab bars or a raised toilet seat can make bathing and toileting easier and safer.   Shopping  Bring a prepared list  of things you need to buy.  Organize your shopping list by aisle or section of the  store.  Transport items in a buggy or shopping cart rather than  carrying them in a basket.  Load and carry grocery bags that are only half full or shop with someone who can help pack and carry bags.  Avoid going out during rush hour when stores and streets are crowded.  Consider using a delivery service.   Housework  Divide activities and do them throughout the week. Balance light with heavy tasks.  Make one side of the bed at a time. Sit to change pillow cases and unfold linen.  Avoid spray cleaners that may irritate your lungs.  Clean the bathtub by sitting or kneeling.  Clean one whole room at a time instead of going back and forth between rooms to do each job.  Consider asking for help from family members or  hiring a cleaning service or housekeeper.  After washing dishes, allow them to air dry.  Have work in front of you rather than at your side.  Slide rather than lift objects.  Use long handled dustpans and cleaning sponges to decrease the need for bending.  4    Make a weekly plan for major jobs such as laundry, cleaning and  changing sheets on beds. Do one job each day.  Keep a trash can in every room to avoid too much walking.  Buy more than one of each item you use around the house.  For example, keep sink cleaner in the bathroom and kitchen.  Keep a vacuum on each level of your home.   Cooking  Cook and bake in steps to reduce energy use.  Gather all ingredients and utensils before starting.  Plan ahead with meal preparation.  Make large meals and freeze in servings for later use.  Use lightweight cookware and dishes to conserve energy.  Use paper plates and cups to eliminate dishwashing.  Use electric appliances such as can openers, blenders, food processors and dishwasher to conserve energy.  Consider buying easy to prepare or frozen meals, or using a meal delivery service.    Key Points:  Prioritize activities of the day. Do heavier tasks when you have more energy.  Plan your days' and weeks' activities. Set up your work area so you do not have to move around a lot looking for items to complete the task. Plan rest times.  Pace yourself. Do not try to complete the whole task in one session. Break it into smaller, easy to do steps. A good guide to follow is to take 10 minutes each hour to rest. Do not rush.  Position and Posture are important. Sit to work when you can to use 25% less energy. Sit and stand as upright as you can. Practice deep breathing exercises while you work to maintain your breathing rate and stay relaxed.  Use assistive devices when recommended to save energy and make it more comfortable and easy taking care of yourself.   Remember.  The most important energy conservation tip is to listen to your body.  Stop and rest BEFORE you get tired. Plan rest times. Rest often.   PD [id number] (Rev 02-2012) File: peyles

## 2023-11-10 NOTE — Therapy (Signed)
 OUTPATIENT OCCUPATIONAL THERAPY NEURO TREATMENT  Patient Name: Margaret Owens MRN: [id number] DOB:27-Nov-1970, 53 y.o., female Today's Date: 11/10/2023  PCP: Shelda Atlas, MD REFERRING PROVIDER: Carilyn Prentice FORBES, MD  END OF SESSION:  OT End of Session - 11/10/23 1519     Visit Number 5    Number of Visits 16    Date for OT Re-Evaluation 12/19/23    Authorization Type [employer] - Approved 16 O.T. visits    Authorization Time Period 10/27/23 - 12/29/23    Authorization - Number of Visits 16    OT Start Time 1457    OT Stop Time 1535    OT Time Calculation (min) 38 min    Activity Tolerance Patient tolerated treatment well    Behavior During Therapy WFL for tasks assessed/performed          Past Medical History:  Diagnosis Date   Anxiety    Arthritis    Asthma    Bursitis    Depression    GERD (gastroesophageal reflux disease)    Hypertension    Muscle spasms of both lower extremities    PTSD (post-traumatic stress disorder)    Sciatica    Seizures (HCC)    Uterine fibroid    Past Surgical History:  Procedure Laterality Date   colonoscopy     Patient Active Problem List   Diagnosis Date Noted   Gait disorder 08/19/2023   Positive ANA (antinuclear antibody) 12/03/2021   Urticaria 12/03/2021   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 12/03/2021   Spinal stenosis of lumbar region with radiculopathy 01/23/2021   COVID-19 vaccination declined 05/16/2020   Prediabetes 05/16/2020   Obesity (BMI 35.0-39.9 without comorbidity) 05/16/2020   Primary osteoarthritis of left knee 07/04/2018   Uterine fibroid 06/09/2017   Endometrial stromal nodule 06/09/2017    ONSET DATE: 09/13/2023 (referral date)   REFERRING DIAG: M79.641,M79.642 (ICD-10-CM) - Bilateral hand pain  THERAPY DIAG:  Pain in left hand  Pain in right hand  Muscle weakness (generalized)  Other disturbances of skin sensation  Other symptoms and signs involving the  musculoskeletal system  Bilateral hand pain  Rationale for Evaluation and Treatment: Rehabilitation  SUBJECTIVE:   SUBJECTIVE STATEMENT: Pt reports she has noticed improvement with B hand pain and paresthesias.   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 No  FALLS: Has patient fallen in last 6 months? No  Lives with: lives with their daughter Lives in: first floor apt Stairs: No Has following equipment at home: Counselling psychologist, Environmental consultant - 2 wheeled, and Shower bench Has following equipment at home: Single point cane and rollator  PLOF: Independent with basic ADLs and filing for disability  PATIENT GOALS: decrease hand pain and increase strength  OBJECTIVE:  Note: Objective measures were completed at Evaluation unless otherwise noted.  DIAGNOSTIC FINDINGS: MRI of lumbar spine from 08/2020   IMPRESSION: 1. Degenerative changes of the lumbar spine with progressed anterolisthesis at L4-5 where there is mild-to-moderate spinal canal stenosis with narrowing of the bilateral subarticular zones, severe right and moderate left neural foraminal narrowing. 2. Moderate right and severe left neural foraminal narrowing at L5-S1, unchanged.  HAND DOMINANCE: Right  ADLs: Eating: independent  Grooming: independent UB Dressing: independent  LB Dressing: independent  Toileting: independent Bathing: independent Tub  Shower transfers: mod I w/ shower chair Equipment: Shower seat with back  IADLs: Shopping: uses Field seismologist (takes public transportation)  [employer]: pt does Meal Prep: independent Community mobility: pt does not currently drive (d/t finances)  Medication management: independent Handwriting: denies change  MOBILITY STATUS: sometimes walks with  cane   FUNCTIONAL OUTCOME MEASURES: Quick Dash: 41% deficit  UPPER EXTREMITY ROM:  BUE AROM WFL's - more difficulty making tight fist with Lt hand   UPPER EXTREMITY MMT:   WFL's  HAND FUNCTION: Grip strength: Right: 28.2 lbs; Left: 7 lbs,  Lateral pinch: Right: 6 lbs, Left: 4 lbs, and  3 point pinch: Right: 2 lbs, Left: 4 lbs  COORDINATION: 9 Hole Peg test: Right: 25.77 sec; Left: 26.51 sec  SENSATION: Numbness Lt long and ring fingers (volar and dorsal)   EDEMA: mild at joints (Rt > Lt long finger DIP joint w/ arthritic changes)   COGNITION: Overall cognitive status: WFL's, very verbose t/o evaluation and needs redirection  VISION: Subjective report: no changes Baseline vision: Wears glasses for reading only Visual history: none   PERCEPTION: Not tested  PRAXIS: Not tested  OBSERVATIONS: +Finklesteins for DeQuervains tenosynovitis Rt side, but also arthritic changes to Rt CMC joint; lesser Lt CMC joint. Pt also reports catching Lt long and ring finger (? Trigger finger) however did not see during evaluation as it doesn't always catch. Pt reports notices more after tight sustained grip                                                                                                                              TREATMENT:   - Therapeutic exercises completed for duration as noted below including:  Reviewed tendon gliding exercises/handout with review of motions to isolate DIP, PIP and MCP joints for straight finger position, hook (DIP/PIP flexion), fist (DIP/PIP/MCP flexion), taco/duck (MCP flexion only) and flat fist (MCP and PIP flexion). Education provided on purpose of tendon glide exercises ie) to increase the circulation to the hand and wrist, reduce swelling and promote healthier soft tissue for increased AROM and not to build hand or wrist strength at this time. Min cues for proper completion.    Pt placed BUE in Fluidotherapy machine with supervised ROM x 15  min. Pt was educated to complete AROM and tendon glides during modality time to improve ROM and decrease pain/stiffness of affected extremity by use of the machine's massaging action and thermal properties.   - Self-care/home management completed for duration as noted below including:  OT educated patient on the 4 Ps of energy conservation including positioning, pace, prioritizing, and planning to maximize efficiency and safety with completion of functional activities as desired.  Patient verbalized understanding of all information provided and was given a packet to take home for review.   PATIENT EDUCATION: Education details: SEE ABOVE Person educated: Patient Education method: Explanation, Demonstration, and Verbal cues Education comprehension: verbalized understanding, returned demonstration, and verbal cues required  HOME EXERCISE PROGRAM: 10/27/23: joint protection techniques, A/E recommendations 11/02/23: A/ROM HEP, brace/splint recommendations 11/08/23: CTS conservative management handout including ex's 11/10/23: EC strategies  GOALS: Goals reviewed with patient? Yes  SHORT TERM GOALS: Target date: 11/18/23  Pt to verbalize understanding with joint protection techniques, task modifications, and possible A/E needs to reduce pain and protect joints Baseline: not yet addressed/issued Goal status: MET  2.  Pt to verbalize understanding with EC techniques and sleep positioning techniques Baseline: not yet addressed/issued Goal status: MET  3.  Pt to be independent with ROM HEP (thumb separate from wrist) Baseline: not yet addressed/issued Goal status: MET   4.  Pain to reduce down to 8/10 or less bilateral hands Baseline: 10/10  Goal status: MET   LONG TERM GOALS: Target date: 12/19/23  Independent with updated strengthening HEP for bilateral hands Baseline: not yet addressed/issued Goal status: INITIAL  2.  Pt to improve Rt grip by 5 lbs and Lt grip by 10 lbs or more to  improve function and ease with opening tight jars/containers Baseline: Rt = 28 lbs, Lt = 7 lbs Goal status: INITIAL  3.  Pt to improve lateral pinch strength bilaterally by 2 lbs or greater Baseline: Rt = 6 lbs, Lt = 4 lbs Goal status: INITIAL  4.  Pt to improve 3 tip pinch strength bilaterally by 2 lbs or greater Baseline: Rt = 2 lbs, Lt = 4 lbs Goal status: INITIAL  5.  Pt to improve BUE deficits as evidenced by reducing deficit on Quick Dash by 10% Baseline: 41% deficit Goal status: INITIAL  ASSESSMENT:  CLINICAL IMPRESSION: Patient demonstrates and verbalizes good understanding of HEP and strategies as needed for conservative management of CTS. Will continue to progress towards goals a written. SABRA  PERFORMANCE DEFICITS: in functional skills including ADLs, IADLs, coordination, dexterity, sensation, edema, ROM, strength, pain, Fine motor control, body mechanics, decreased knowledge of use of DME, and UE functional use.   IMPAIRMENTS: are limiting patient from ADLs, IADLs, rest and sleep, and leisure.   CO-MORBIDITIES: may have co-morbidities  that affects occupational performance. Patient will benefit from skilled OT to address above impairments and improve overall function.  REHAB POTENTIAL: Good  PLAN:  OT FREQUENCY: 1-2x/week  OT DURATION: 8 weeks  PLANNED INTERVENTIONS: 97535 self care/ADL training, 02889 therapeutic exercise, 97530 therapeutic activity, 97140 manual therapy, 97113 aquatic therapy, 97035 ultrasound, 97018 paraffin, 02960 fluidotherapy, 97010 moist heat, 97010 cryotherapy, 97034 contrast bath, 97760 Orthotic Initial, 97763 Orthotic/Prosthetic subsequent, passive range of motion, patient/family education, and DME and/or AE instructions  RECOMMENDED OTHER SERVICES: none at this time - currently seeing P.T.   CONSULTED AND AGREED WITH PLAN OF CARE: Patient  PLAN FOR NEXT SESSION: Continue to LTGs ? If she ordered any braces that were recommended (Rt  thumb/wrist, Lt elbow) - pt was waiting on finances; light strengthening   For all possible CPT codes, reference the Planned Interventions line above.     Check all conditions that are expected to impact treatment: {Conditions expected to impact treatment:Neurological condition and/or seizures and Psychological or psychiatric disorders   If treatment provided at initial evaluation, no treatment charged due to lack of authorization.        Jocelyn CHRISTELLA Bottom, OT 11/10/2023, 3:35 PM

## 2023-11-15 ENCOUNTER — Encounter: Payer: Self-pay | Admitting: Occupational Therapy

## 2023-11-15 ENCOUNTER — Ambulatory Visit: Attending: Physical Medicine & Rehabilitation | Admitting: Occupational Therapy

## 2023-11-15 DIAGNOSIS — M6281 Muscle weakness (generalized): Secondary | ICD-10-CM | POA: Diagnosis present

## 2023-11-15 DIAGNOSIS — R29898 Other symptoms and signs involving the musculoskeletal system: Secondary | ICD-10-CM | POA: Insufficient documentation

## 2023-11-15 DIAGNOSIS — M79642 Pain in left hand: Secondary | ICD-10-CM | POA: Diagnosis present

## 2023-11-15 DIAGNOSIS — M79641 Pain in right hand: Secondary | ICD-10-CM | POA: Insufficient documentation

## 2023-11-15 DIAGNOSIS — R208 Other disturbances of skin sensation: Secondary | ICD-10-CM | POA: Diagnosis present

## 2023-11-15 NOTE — Therapy (Signed)
 OUTPATIENT OCCUPATIONAL THERAPY NEURO TREATMENT  Patient Name: Margaret Owens MRN: [id number] DOB:12-15-1970, 53 y.o., female Today's Date: 11/15/2023  PCP: Shelda Atlas, MD REFERRING PROVIDER: Carilyn Prentice FORBES, MD  END OF SESSION:  OT End of Session - 11/15/23 1319     Visit Number 6    Number of Visits 16    Date for OT Re-Evaluation 12/19/23    Authorization Type [employer] - Approved 16 O.T. visits    Authorization Time Period 10/27/23 - 12/29/23    Authorization - Visit Number 5    Authorization - Number of Visits 16    OT Start Time 1318    OT Stop Time 1400    OT Time Calculation (min) 42 min    Activity Tolerance Patient tolerated treatment well    Behavior During Therapy WFL for tasks assessed/performed          Past Medical History:  Diagnosis Date   Anxiety    Arthritis    Asthma    Bursitis    Depression    GERD (gastroesophageal reflux disease)    Hypertension    Muscle spasms of both lower extremities    PTSD (post-traumatic stress disorder)    Sciatica    Seizures (HCC)    Uterine fibroid    Past Surgical History:  Procedure Laterality Date   colonoscopy     Patient Active Problem List   Diagnosis Date Noted   Gait disorder 08/19/2023   Positive ANA (antinuclear antibody) 12/03/2021   Urticaria 12/03/2021   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 12/03/2021   Spinal stenosis of lumbar region with radiculopathy 01/23/2021   COVID-19 vaccination declined 05/16/2020   Prediabetes 05/16/2020   Obesity (BMI 35.0-39.9 without comorbidity) 05/16/2020   Primary osteoarthritis of left knee 07/04/2018   Uterine fibroid 06/09/2017   Endometrial stromal nodule 06/09/2017    ONSET DATE: 09/13/2023 (referral date)   REFERRING DIAG: M79.641,M79.642 (ICD-10-CM) - Bilateral hand pain  THERAPY DIAG:  Pain in left hand  Pain in right hand  Muscle weakness (generalized)  Other disturbances of skin sensation  Other  symptoms and signs involving the musculoskeletal system  Bilateral hand pain  Rationale for Evaluation and Treatment: Rehabilitation  SUBJECTIVE:   SUBJECTIVE STATEMENT: Pt reports she took it easy this weekend so pain is only 2/10 bilateral hands   Pt accompanied by: self  PERTINENT HISTORY: anxiety, GERD, HTN, PTSD, sciatica, seizures, 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 No  FALLS: Has patient fallen in last 6 months? No  Lives with: lives with their daughter Lives in: first floor apt Stairs: No Has following equipment at home: Counselling psychologist, Environmental consultant - 2 wheeled, and Shower bench Has following equipment at home: Single point cane and rollator  PLOF: Independent with basic ADLs and filing for disability  PATIENT GOALS: decrease hand pain and increase strength  OBJECTIVE:  Note: Objective measures were completed at Evaluation unless otherwise noted.  DIAGNOSTIC FINDINGS: MRI of lumbar spine from 08/2020   IMPRESSION: 1. Degenerative changes of the lumbar spine with progressed anterolisthesis at L4-5 where there is mild-to-moderate spinal canal stenosis with narrowing of the bilateral subarticular zones, severe right and moderate left neural foraminal narrowing. 2. Moderate right and severe left neural foraminal narrowing at L5-S1, unchanged.  HAND DOMINANCE: Right  ADLs: Eating: independent  Grooming: independent UB Dressing:  independent  LB Dressing: independent  Toileting: independent Bathing: independent Tub Shower transfers: mod I w/ shower chair Equipment: Shower seat with back  IADLs: Shopping: uses Field seismologist (takes public transportation)  [employer]: pt does Meal Prep: independent Community mobility: pt does not currently drive (d/t finances)  Medication management: independent Handwriting: denies  change  MOBILITY STATUS: sometimes walks with cane   FUNCTIONAL OUTCOME MEASURES: Quick Dash: 41% deficit  UPPER EXTREMITY ROM:  BUE AROM WFL's - more difficulty making tight fist with Lt hand   UPPER EXTREMITY MMT:   WFL's  HAND FUNCTION: Grip strength: Right: 28.2 lbs; Left: 7 lbs,  Lateral pinch: Right: 6 lbs, Left: 4 lbs, and  3 point pinch: Right: 2 lbs, Left: 4 lbs  COORDINATION: 9 Hole Peg test: Right: 25.77 sec; Left: 26.51 sec  SENSATION: Numbness Lt long and ring fingers (volar and dorsal)   EDEMA: mild at joints (Rt > Lt long finger DIP joint w/ arthritic changes)   COGNITION: Overall cognitive status: WFL's, very verbose t/o evaluation and needs redirection  VISION: Subjective report: no changes Baseline vision: Wears glasses for reading only Visual history: none   PERCEPTION: Not tested  PRAXIS: Not tested  OBSERVATIONS: +Finklesteins for DeQuervains tenosynovitis Rt side, but also arthritic changes to Rt CMC joint; lesser Lt CMC joint. Pt also reports catching Lt long and ring finger (? Trigger finger) however did not see during evaluation as it doesn't always catch. Pt reports notices more after tight sustained grip                                                                                                                              TREATMENT:    Fluidotherapy x 10 minutes for bilateral hands to address pain, swelling, and stiffness. No adverse reactions.   Grip strength: Rt = 20.7 lbs, Lt = 13.0 lbs  Pt issued putty HEP and yellow resistance putty for home use. Pt return demo of each x 10-15 reps (as indicated) bilateral hands. Worked on grip strength, 3 tip pinch strength, and lateral pinch - see pt instructions for details  PATIENT EDUCATION: Education details: SEE ABOVE Person educated: Patient Education method: Explanation, Demonstration, and Verbal cues Education comprehension: verbalized understanding, returned demonstration, and  verbal cues required  HOME EXERCISE PROGRAM: 10/27/23: joint protection techniques, A/E recommendations 11/02/23: A/ROM HEP, brace/splint recommendations 11/08/23: CTS conservative management handout including ex's 11/10/23: EC strategies 11/15/23: putty HEP   GOALS: Goals reviewed with patient? Yes  SHORT TERM GOALS: Target date: 11/18/23  Pt to verbalize understanding with joint protection techniques, task modifications, and possible A/E needs to reduce pain and protect joints Baseline: not yet addressed/issued Goal status: MET  2.  Pt to verbalize understanding with EC techniques and sleep positioning techniques Baseline: not yet addressed/issued Goal status: MET  3.  Pt to be independent with ROM HEP (thumb separate from wrist) Baseline: not yet addressed/issued Goal status: MET   4.  Pain to reduce down to 8/10 or less bilateral hands Baseline: 10/10  Goal status: MET   LONG TERM GOALS: Target date: 12/19/23  Independent with updated strengthening HEP for bilateral hands Baseline: not yet addressed/issued Goal status: IN PROGRESS  2.  Pt to improve Rt grip by 5 lbs and Lt grip by 10 lbs or more to improve function and ease with opening tight jars/containers Baseline: Rt = 28 lbs, Lt = 7 lbs Goal status: INITIAL  3.  Pt to improve lateral pinch strength bilaterally by 2 lbs or greater Baseline: Rt = 6 lbs, Lt = 4 lbs Goal status: INITIAL  4.  Pt to improve 3 tip pinch strength bilaterally by 2 lbs or greater Baseline: Rt = 2 lbs, Lt = 4 lbs Goal status: INITIAL  5.  Pt to improve BUE deficits as evidenced by reducing deficit on Quick Dash by 10% Baseline: 41% deficit Goal status: INITIAL   ASSESSMENT:  CLINICAL IMPRESSION: Patient demonstrates and verbalizes good understanding of HEP and strategies as needed for conservative management of CTS. Pt has met all STG's. Tolerating putty HEP well today. Will continue to progress towards goals as written. SABRA  PERFORMANCE  DEFICITS: in functional skills including ADLs, IADLs, coordination, dexterity, sensation, edema, ROM, strength, pain, Fine motor control, body mechanics, decreased knowledge of use of DME, and UE functional use.   IMPAIRMENTS: are limiting patient from ADLs, IADLs, rest and sleep, and leisure.   CO-MORBIDITIES: may have co-morbidities  that affects occupational performance. Patient will benefit from skilled OT to address above impairments and improve overall function.  REHAB POTENTIAL: Good  PLAN:  OT FREQUENCY: 1-2x/week  OT DURATION: 8 weeks  PLANNED INTERVENTIONS: 97535 self care/ADL training, 02889 therapeutic exercise, 97530 therapeutic activity, 97140 manual therapy, 97113 aquatic therapy, 97035 ultrasound, 97018 paraffin, 02960 fluidotherapy, 97010 moist heat, 97010 cryotherapy, 97034 contrast bath, 97760 Orthotic Initial, 97763 Orthotic/Prosthetic subsequent, passive range of motion, patient/family education, and DME and/or AE instructions  RECOMMENDED OTHER SERVICES: none at this time - currently seeing P.T.   CONSULTED AND AGREED WITH PLAN OF CARE: Patient  PLAN FOR NEXT SESSION:  Continue fluido, Progress towards LTG's, review putty HEP PRN  For all possible CPT codes, reference the Planned Interventions line above.     Check all conditions that are expected to impact treatment: {Conditions expected to impact treatment:Neurological condition and/or seizures and Psychological or psychiatric disorders   If treatment provided at initial evaluation, no treatment charged due to lack of authorization.        Burnard JINNY Roads, OT 11/15/2023, 1:20 PM

## 2023-11-15 NOTE — Patient Instructions (Addendum)
 1. Grip Strengthening (Resistive Putty)   Squeeze putty using thumb and all fingers. Repeat _10-15___ times. Do __1-2__ sessions per day.   2. Roll putty into tube on table and pinch between first two fingers and thumb x 10 reps. Do 1-2 sessions per day  3. Lateral Pinch Strengthening (Resistive Putty)    Squeeze between thumb and side of index finger (thumb on top).  Repeat __10__ times. Do __1-2__ sessions per day.

## 2023-11-17 ENCOUNTER — Encounter: Payer: Self-pay | Admitting: Occupational Therapy

## 2023-11-17 ENCOUNTER — Ambulatory Visit: Admitting: Occupational Therapy

## 2023-11-17 DIAGNOSIS — R208 Other disturbances of skin sensation: Secondary | ICD-10-CM

## 2023-11-17 DIAGNOSIS — M79642 Pain in left hand: Secondary | ICD-10-CM | POA: Diagnosis not present

## 2023-11-17 DIAGNOSIS — R29898 Other symptoms and signs involving the musculoskeletal system: Secondary | ICD-10-CM

## 2023-11-17 DIAGNOSIS — M6281 Muscle weakness (generalized): Secondary | ICD-10-CM

## 2023-11-17 DIAGNOSIS — M79641 Pain in right hand: Secondary | ICD-10-CM

## 2023-11-17 NOTE — Therapy (Signed)
 OUTPATIENT OCCUPATIONAL THERAPY NEURO TREATMENT  Patient Name: Margaret Owens MRN: [id number] DOB:1970/09/21, 53 y.o., female Today's Date: 11/17/2023  PCP: Shelda Atlas, MD REFERRING PROVIDER: Carilyn Prentice FORBES, MD  END OF SESSION:  OT End of Session - 11/17/23 1329     Visit Number 7    Number of Visits 16    Date for OT Re-Evaluation 12/19/23    Authorization Type [employer] - Approved 16 O.T. visits    Authorization Time Period 10/27/23 - 12/29/23    Authorization - Number of Visits 16    OT Start Time 1320    OT Stop Time 1400    OT Time Calculation (min) 40 min    Activity Tolerance Patient tolerated treatment well    Behavior During Therapy WFL for tasks assessed/performed          Past Medical History:  Diagnosis Date   Anxiety    Arthritis    Asthma    Bursitis    Depression    GERD (gastroesophageal reflux disease)    Hypertension    Muscle spasms of both lower extremities    PTSD (post-traumatic stress disorder)    Sciatica    Seizures (HCC)    Uterine fibroid    Past Surgical History:  Procedure Laterality Date   colonoscopy     Patient Active Problem List   Diagnosis Date Noted   Gait disorder 08/19/2023   Positive ANA (antinuclear antibody) 12/03/2021   Urticaria 12/03/2021   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 12/03/2021   Spinal stenosis of lumbar region with radiculopathy 01/23/2021   COVID-19 vaccination declined 05/16/2020   Prediabetes 05/16/2020   Obesity (BMI 35.0-39.9 without comorbidity) 05/16/2020   Primary osteoarthritis of left knee 07/04/2018   Uterine fibroid 06/09/2017   Endometrial stromal nodule 06/09/2017    ONSET DATE: 09/13/2023 (referral date)   REFERRING DIAG: M79.641,M79.642 (ICD-10-CM) - Bilateral hand pain  THERAPY DIAG:  Pain in left hand  Pain in right hand  Muscle weakness (generalized)  Other disturbances of skin sensation  Other symptoms and signs involving the  musculoskeletal system  Rationale for Evaluation and Treatment: Rehabilitation  SUBJECTIVE:   SUBJECTIVE STATEMENT: Pt reports Lt knee pain from rainy weather. No pain in hands today but stiff. The putty is going well w/ pain up to 3/10 w/ repetition.   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 No  FALLS: Has patient fallen in last 6 months? No  Lives with: lives with their daughter Lives in: first floor apt Stairs: No Has following equipment at home: Counselling psychologist, Environmental consultant - 2 wheeled, and Shower bench Has following equipment at home: Single point cane and rollator  PLOF: Independent with basic ADLs and filing for disability  PATIENT GOALS: decrease hand pain and increase strength  OBJECTIVE:  Note: Objective measures were completed at Evaluation unless otherwise noted.  DIAGNOSTIC FINDINGS: MRI of lumbar spine from 08/2020   IMPRESSION: 1. Degenerative changes of the lumbar spine with progressed anterolisthesis at L4-5 where there is mild-to-moderate spinal canal stenosis with narrowing of the bilateral subarticular zones, severe right and moderate left neural foraminal narrowing. 2. Moderate right and severe left neural foraminal narrowing at L5-S1, unchanged.  HAND DOMINANCE: Right  ADLs: Eating: independent  Grooming: independent UB Dressing:  independent  LB Dressing: independent  Toileting: independent Bathing: independent Tub Shower transfers: mod I w/ shower chair Equipment: Shower seat with back  IADLs: Shopping: uses Field seismologist (takes public transportation)  [employer]: pt does Meal Prep: independent Community mobility: pt does not currently drive (d/t finances)  Medication management: independent Handwriting: denies  change  MOBILITY STATUS: sometimes walks with cane   FUNCTIONAL OUTCOME MEASURES: Quick Dash: 41% deficit  UPPER EXTREMITY ROM:  BUE AROM WFL's - more difficulty making tight fist with Lt hand   UPPER EXTREMITY MMT:   WFL's  HAND FUNCTION: Grip strength: Right: 28.2 lbs; Left: 7 lbs,  Lateral pinch: Right: 6 lbs, Left: 4 lbs, and  3 point pinch: Right: 2 lbs, Left: 4 lbs 11/15/23: Grip: Rt = 20.7 lbs, Lt = 13.0 lbs   COORDINATION: 9 Hole Peg test: Right: 25.77 sec; Left: 26.51 sec  SENSATION: Numbness Lt long and ring fingers (volar and dorsal)   EDEMA: mild at joints (Rt > Lt long finger DIP joint w/ arthritic changes)   COGNITION: Overall cognitive status: WFL's, very verbose t/o evaluation and needs redirection  VISION: Subjective report: no changes Baseline vision: Wears glasses for reading only Visual history: none   PERCEPTION: Not tested  PRAXIS: Not tested  OBSERVATIONS: +Finklesteins for DeQuervains tenosynovitis Rt side, but also arthritic changes to Rt CMC joint; lesser Lt CMC joint. Pt also reports catching Lt long and ring finger (? Trigger finger) however did not see during evaluation as it doesn't always catch. Pt reports notices more after tight sustained grip                                                                                                                              TREATMENT:    Fluidotherapy x 10 minutes for bilateral hands to address pain, swelling, and stiffness. No adverse reactions.   Reviewed putty HEP issued last session. Pt performed each x 10 reps with yellow putty  Pt working on pinch strength RUE, then LUE placing graded resistance clothespins (yellow to red) on antenna and then removing   PATIENT EDUCATION: Education details: SEE ABOVE Person educated: Patient Education method: Explanation, Demonstration, and Verbal cues Education comprehension: verbalized understanding, returned demonstration, and verbal cues  required  HOME EXERCISE PROGRAM: 10/27/23: joint protection techniques, A/E recommendations 11/02/23: A/ROM HEP, brace/splint recommendations 11/08/23: CTS conservative management handout including ex's 11/10/23: EC strategies 11/15/23: putty HEP   GOALS: Goals reviewed with patient? Yes  SHORT TERM GOALS: Target date: 11/18/23  Pt to verbalize understanding with joint protection techniques, task modifications, and possible A/E needs to reduce pain and protect joints Baseline: not yet addressed/issued Goal status: MET  2.  Pt to verbalize understanding with EC techniques and sleep positioning techniques Baseline: not yet addressed/issued Goal status: MET  3.  Pt to be independent with ROM HEP (thumb separate from wrist) Baseline: not yet addressed/issued Goal status: MET   4.  Pain to  reduce down to 8/10 or less bilateral hands Baseline: 10/10  Goal status: MET   LONG TERM GOALS: Target date: 12/19/23  Independent with updated strengthening HEP for bilateral hands Baseline: not yet addressed/issued Goal status: IN PROGRESS  2.  Pt to improve Rt grip by 5 lbs and Lt grip by 10 lbs or more to improve function and ease with opening tight jars/containers Baseline: Rt = 28 lbs, Lt = 7 lbs Goal status: INITIAL  3.  Pt to improve lateral pinch strength bilaterally by 2 lbs or greater Baseline: Rt = 6 lbs, Lt = 4 lbs Goal status: INITIAL  4.  Pt to improve 3 tip pinch strength bilaterally by 2 lbs or greater Baseline: Rt = 2 lbs, Lt = 4 lbs Goal status: INITIAL  5.  Pt to improve BUE deficits as evidenced by reducing deficit on Quick Dash by 10% Baseline: 41% deficit Goal status: INITIAL   ASSESSMENT:  CLINICAL IMPRESSION:  Pt has met all STG's. Tolerating putty HEP well today. Pain consistently minimal in hands. Will continue to progress towards LTG's. .  PERFORMANCE DEFICITS: in functional skills including ADLs, IADLs, coordination, dexterity, sensation, edema, ROM,  strength, pain, Fine motor control, body mechanics, decreased knowledge of use of DME, and UE functional use.   IMPAIRMENTS: are limiting patient from ADLs, IADLs, rest and sleep, and leisure.   CO-MORBIDITIES: may have co-morbidities  that affects occupational performance. Patient will benefit from skilled OT to address above impairments and improve overall function.  REHAB POTENTIAL: Good  PLAN:  OT FREQUENCY: 1-2x/week  OT DURATION: 8 weeks  PLANNED INTERVENTIONS: 97535 self care/ADL training, 02889 therapeutic exercise, 97530 therapeutic activity, 97140 manual therapy, 97113 aquatic therapy, 97035 ultrasound, 97018 paraffin, 02960 fluidotherapy, 97010 moist heat, 97010 cryotherapy, 97034 contrast bath, 97760 Orthotic Initial, 97763 Orthotic/Prosthetic subsequent, passive range of motion, patient/family education, and DME and/or AE instructions  RECOMMENDED OTHER SERVICES: none at this time - currently seeing P.T.   CONSULTED AND AGREED WITH PLAN OF CARE: Patient  PLAN FOR NEXT SESSION:  Continue fluido, Progress towards LTG's, gripper  For all possible CPT codes, reference the Planned Interventions line above.     Check all conditions that are expected to impact treatment: {Conditions expected to impact treatment:Neurological condition and/or seizures and Psychological or psychiatric disorders   If treatment provided at initial evaluation, no treatment charged due to lack of authorization.        Burnard JINNY Roads, OT 11/17/2023, 1:29 PM

## 2023-11-22 ENCOUNTER — Ambulatory Visit: Admitting: Occupational Therapy

## 2023-11-22 ENCOUNTER — Encounter: Payer: Self-pay | Admitting: Occupational Therapy

## 2023-11-22 DIAGNOSIS — R208 Other disturbances of skin sensation: Secondary | ICD-10-CM

## 2023-11-22 DIAGNOSIS — M6281 Muscle weakness (generalized): Secondary | ICD-10-CM

## 2023-11-22 DIAGNOSIS — M79642 Pain in left hand: Secondary | ICD-10-CM | POA: Diagnosis not present

## 2023-11-22 DIAGNOSIS — R29898 Other symptoms and signs involving the musculoskeletal system: Secondary | ICD-10-CM

## 2023-11-22 DIAGNOSIS — M79641 Pain in right hand: Secondary | ICD-10-CM

## 2023-11-22 NOTE — Therapy (Signed)
 OUTPATIENT OCCUPATIONAL THERAPY NEURO TREATMENT  Patient Name: Margaret Owens MRN: [id number] DOB:Dec 02, 1970, 53 y.o., female 24 Date: 11/22/2023  PCP: Shelda Atlas, MD REFERRING PROVIDER: Carilyn Prentice FORBES, MD  END OF SESSION:  OT End of Session - 11/22/23 1323     Visit Number 8    Number of Visits 16    Date for OT Re-Evaluation 12/19/23    Authorization Type [employer] - Approved 16 O.T. visits    Authorization Time Period 10/27/23 - 12/29/23    Authorization - Number of Visits 16    OT Start Time 1318    OT Stop Time 1400    OT Time Calculation (min) 42 min    Activity Tolerance Patient tolerated treatment well    Behavior During Therapy WFL for tasks assessed/performed          Past Medical History:  Diagnosis Date   Anxiety    Arthritis    Asthma    Bursitis    Depression    GERD (gastroesophageal reflux disease)    Hypertension    Muscle spasms of both lower extremities    PTSD (post-traumatic stress disorder)    Sciatica    Seizures (HCC)    Uterine fibroid    Past Surgical History:  Procedure Laterality Date   colonoscopy     Patient Active Problem List   Diagnosis Date Noted   Gait disorder 08/19/2023   Positive ANA (antinuclear antibody) 12/03/2021   Urticaria 12/03/2021   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 12/03/2021   Spinal stenosis of lumbar region with radiculopathy 01/23/2021   COVID-19 vaccination declined 05/16/2020   Prediabetes 05/16/2020   Obesity (BMI 35.0-39.9 without comorbidity) 05/16/2020   Primary osteoarthritis of left knee 07/04/2018   Uterine fibroid 06/09/2017   Endometrial stromal nodule 06/09/2017    ONSET DATE: 09/13/2023 (referral date)   REFERRING DIAG: M79.641,M79.642 (ICD-10-CM) - Bilateral hand pain  THERAPY DIAG:  Muscle weakness (generalized)  Other disturbances of skin sensation  Other symptoms and signs involving the musculoskeletal system  Bilateral hand  pain  Rationale for Evaluation and Treatment: Rehabilitation  SUBJECTIVE:   SUBJECTIVE STATEMENT: No pain today  Pt accompanied by: self  PERTINENT HISTORY: anxiety, GERD, HTN, PTSD, sciatica, seizures, 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 No  FALLS: Has patient fallen in last 6 months? No  Lives with: lives with their daughter Lives in: first floor apt Stairs: No Has following equipment at home: Counselling psychologist, Environmental consultant - 2 wheeled, and Shower bench Has following equipment at home: Single point cane and rollator  PLOF: Independent with basic ADLs and filing for disability  PATIENT GOALS: decrease hand pain and increase strength  OBJECTIVE:  Note: Objective measures were completed at Evaluation unless otherwise noted.  DIAGNOSTIC FINDINGS: MRI of lumbar spine from 08/2020   IMPRESSION: 1. Degenerative changes of the lumbar spine with progressed anterolisthesis at L4-5 where there is mild-to-moderate spinal canal stenosis with narrowing of the bilateral subarticular zones, severe right and moderate left neural foraminal narrowing. 2. Moderate right and severe left neural foraminal narrowing at L5-S1, unchanged.  HAND DOMINANCE: Right  ADLs: Eating: independent  Grooming: independent UB Dressing: independent  LB Dressing: independent  Toileting: independent Bathing: independent Tub Shower transfers: mod I w/ shower chair Equipment: Shower seat with back  IADLs: Shopping: uses Field seismologist (takes public  transportation)  Light housekeeping: pt does Meal Prep: independent Community mobility: pt does not currently drive (d/t finances)  Medication management: independent Handwriting: denies change  MOBILITY STATUS: sometimes walks with cane   FUNCTIONAL OUTCOME MEASURES: Quick Dash: 41% deficit  UPPER EXTREMITY ROM:  BUE AROM WFL's -  more difficulty making tight fist with Lt hand   UPPER EXTREMITY MMT:   WFL's  HAND FUNCTION: Grip strength: Right: 28.2 lbs; Left: 7 lbs,  Lateral pinch: Right: 6 lbs, Left: 4 lbs, and  3 point pinch: Right: 2 lbs, Left: 4 lbs 11/15/23: Grip: Rt = 20.7 lbs, Lt = 13.0 lbs   COORDINATION: 9 Hole Peg test: Right: 25.77 sec; Left: 26.51 sec  SENSATION: Numbness Lt long and ring fingers (volar and dorsal)   EDEMA: mild at joints (Rt > Lt long finger DIP joint w/ arthritic changes)   COGNITION: Overall cognitive status: WFL's, very verbose t/o evaluation and needs redirection  VISION: Subjective report: no changes Baseline vision: Wears glasses for reading only Visual history: none   PERCEPTION: Not tested  PRAXIS: Not tested  OBSERVATIONS: +Finklesteins for DeQuervains tenosynovitis Rt side, but also arthritic changes to Rt CMC joint; lesser Lt CMC joint. Pt also reports catching Lt long and ring finger (? Trigger finger) however did not see during evaluation as it doesn't always catch. Pt reports notices more after tight sustained grip                                                                                                                              TREATMENT:    Fluidotherapy x 10 minutes for bilateral hands to address pain, swelling, and stiffness. No adverse reactions.   Gripper set at level 2 resistance to pick up blocks Rt hand for sustained grip strength and function - pt advised to take 1 rest break to stretch hand prn Gripper set at level 1 resistance to pick up blocks Lt hand for sustained grip strength and function   Reviewed ways to minimize/reduce symptoms of pain and/or soreness. Discussed plan for next visit and possible d/c   Pt shown paraffin in prep for next session and benefits of paraffin for joint pain/stiffness and arthritis.    PATIENT EDUCATION: Education details: SEE ABOVE Person educated: Patient Education method: Explanation,  Demonstration, and Verbal cues Education comprehension: verbalized understanding, returned demonstration, and verbal cues required  HOME EXERCISE PROGRAM: 10/27/23: joint protection techniques, A/E recommendations 11/02/23: A/ROM HEP, brace/splint recommendations 11/08/23: CTS conservative management handout including ex's 11/10/23: EC strategies 11/15/23: putty HEP   GOALS: Goals reviewed with patient? Yes  SHORT TERM GOALS: Target date: 11/18/23  Pt to verbalize understanding with joint protection techniques, task modifications, and possible A/E needs to reduce pain and protect joints Baseline: not yet addressed/issued Goal status: MET  2.  Pt to verbalize understanding with EC techniques and sleep positioning techniques Baseline: not yet addressed/issued Goal status: MET  3.  Pt to be independent  with ROM HEP (thumb separate from wrist) Baseline: not yet addressed/issued Goal status: MET   4.  Pain to reduce down to 8/10 or less bilateral hands Baseline: 10/10  Goal status: MET   LONG TERM GOALS: Target date: 12/19/23  Independent with updated strengthening HEP for bilateral hands Baseline: not yet addressed/issued Goal status: IN PROGRESS  2.  Pt to improve Rt grip by 5 lbs and Lt grip by 10 lbs or more to improve function and ease with opening tight jars/containers Baseline: Rt = 28 lbs, Lt = 7 lbs Goal status: INITIAL  3.  Pt to improve lateral pinch strength bilaterally by 2 lbs or greater Baseline: Rt = 6 lbs, Lt = 4 lbs Goal status: INITIAL  4.  Pt to improve 3 tip pinch strength bilaterally by 2 lbs or greater Baseline: Rt = 2 lbs, Lt = 4 lbs Goal status: INITIAL  5.  Pt to improve BUE deficits as evidenced by reducing deficit on Quick Dash by 10% Baseline: 41% deficit Goal status: INITIAL   ASSESSMENT:  CLINICAL IMPRESSION:  Pt has met all STG's. Tolerating gripper activity well today. Pain consistently minimal in hands. Will continue to progress towards  LTG's. .  PERFORMANCE DEFICITS: in functional skills including ADLs, IADLs, coordination, dexterity, sensation, edema, ROM, strength, pain, Fine motor control, body mechanics, decreased knowledge of use of DME, and UE functional use.   IMPAIRMENTS: are limiting patient from ADLs, IADLs, rest and sleep, and leisure.   CO-MORBIDITIES: may have co-morbidities  that affects occupational performance. Patient will benefit from skilled OT to address above impairments and improve overall function.  REHAB POTENTIAL: Good  PLAN:  OT FREQUENCY: 1-2x/week  OT DURATION: 8 weeks  PLANNED INTERVENTIONS: 97535 self care/ADL training, 02889 therapeutic exercise, 97530 therapeutic activity, 97140 manual therapy, 97113 aquatic therapy, 97035 ultrasound, 97018 paraffin, 02960 fluidotherapy, 97010 moist heat, 97010 cryotherapy, 97034 contrast bath, 97760 Orthotic Initial, 97763 Orthotic/Prosthetic subsequent, passive range of motion, patient/family education, and DME and/or AE instructions  RECOMMENDED OTHER SERVICES: none at this time - currently seeing P.T.   CONSULTED AND AGREED WITH PLAN OF CARE: Patient  PLAN FOR NEXT SESSION:  Try paraffin, assess progress towards remaining LTG's - if needed, add 2-3 more visits, however may be able to d/c next session (next session is last scheduled session)  For all possible CPT codes, reference the Planned Interventions line above.     Check all conditions that are expected to impact treatment: {Conditions expected to impact treatment:Neurological condition and/or seizures and Psychological or psychiatric disorders   If treatment provided at initial evaluation, no treatment charged due to lack of authorization.        Burnard JINNY Roads, OT 11/22/2023, 1:23 PM

## 2023-11-24 ENCOUNTER — Encounter: Payer: Self-pay | Admitting: Occupational Therapy

## 2023-11-24 ENCOUNTER — Ambulatory Visit: Admitting: Occupational Therapy

## 2023-11-24 DIAGNOSIS — R29898 Other symptoms and signs involving the musculoskeletal system: Secondary | ICD-10-CM

## 2023-11-24 DIAGNOSIS — M6281 Muscle weakness (generalized): Secondary | ICD-10-CM

## 2023-11-24 DIAGNOSIS — M79642 Pain in left hand: Secondary | ICD-10-CM

## 2023-11-24 DIAGNOSIS — M79641 Pain in right hand: Secondary | ICD-10-CM

## 2023-11-24 DIAGNOSIS — R208 Other disturbances of skin sensation: Secondary | ICD-10-CM

## 2023-11-24 NOTE — Therapy (Signed)
 OUTPATIENT OCCUPATIONAL THERAPY NEURO TREATMENT  Patient Name: Margaret Owens MRN: [id number] DOB:08/28/1970, 53 y.o., female 63 Date: 11/24/2023  PCP: Shelda Atlas, MD REFERRING PROVIDER: Carilyn Prentice FORBES, MD  END OF SESSION:  OT End of Session - 11/24/23 1328     Visit Number 9    Number of Visits 16    Date for OT Re-Evaluation 12/19/23    Authorization Type [employer] - Approved 16 O.T. visits    Authorization Time Period 10/27/23 - 12/29/23    Authorization - Number of Visits 16    OT Start Time 1320    OT Stop Time 1400    OT Time Calculation (min) 40 min    Activity Tolerance Patient tolerated treatment well    Behavior During Therapy WFL for tasks assessed/performed          Past Medical History:  Diagnosis Date   Anxiety    Arthritis    Asthma    Bursitis    Depression    GERD (gastroesophageal reflux disease)    Hypertension    Muscle spasms of both lower extremities    PTSD (post-traumatic stress disorder)    Sciatica    Seizures (HCC)    Uterine fibroid    Past Surgical History:  Procedure Laterality Date   colonoscopy     Patient Active Problem List   Diagnosis Date Noted   Gait disorder 08/19/2023   Positive ANA (antinuclear antibody) 12/03/2021   Urticaria 12/03/2021   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 12/03/2021   Spinal stenosis of lumbar region with radiculopathy 01/23/2021   COVID-19 vaccination declined 05/16/2020   Prediabetes 05/16/2020   Obesity (BMI 35.0-39.9 without comorbidity) 05/16/2020   Primary osteoarthritis of left knee 07/04/2018   Uterine fibroid 06/09/2017   Endometrial stromal nodule 06/09/2017    ONSET DATE: 09/13/2023 (referral date)   REFERRING DIAG: M79.641,M79.642 (ICD-10-CM) - Bilateral hand pain  THERAPY DIAG:  Pain in left hand  Pain in right hand  Other symptoms and signs involving the musculoskeletal system  Other disturbances of skin sensation  Muscle  weakness (generalized)  Rationale for Evaluation and Treatment: Rehabilitation  SUBJECTIVE:   SUBJECTIVE STATEMENT: No pain today. 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 No  FALLS: Has patient fallen in last 6 months? No  Lives with: lives with their daughter Lives in: first floor apt Stairs: No Has following equipment at home: Counselling psychologist, Environmental consultant - 2 wheeled, and Shower bench Has following equipment at home: Single point cane and rollator  PLOF: Independent with basic ADLs and filing for disability  PATIENT GOALS: decrease hand pain and increase strength  OBJECTIVE:  Note: Objective measures were completed at Evaluation unless otherwise noted.  DIAGNOSTIC FINDINGS: MRI of lumbar spine from 08/2020   IMPRESSION: 1. Degenerative changes of the lumbar spine with progressed anterolisthesis at L4-5 where there is mild-to-moderate spinal canal stenosis with narrowing of the bilateral subarticular zones, severe right and moderate left neural foraminal narrowing. 2. Moderate right and severe left neural foraminal narrowing at L5-S1, unchanged.  HAND DOMINANCE: Right  ADLs: Eating: independent  Grooming: independent UB Dressing: independent  LB Dressing: independent  Toileting: independent Bathing: independent Tub Shower transfers:  mod I w/ shower chair Equipment: Shower seat with back  IADLs: Shopping: uses Field seismologist (takes public transportation)  Light housekeeping: pt does Meal Prep: independent Community mobility: pt does not currently drive (d/t finances)  Medication management: independent Handwriting: denies change  MOBILITY STATUS: sometimes walks with cane   FUNCTIONAL OUTCOME  MEASURES: Quick Dash: 41% deficit  UPPER EXTREMITY ROM:  BUE AROM WFL's - more difficulty making tight fist with Lt hand   UPPER EXTREMITY MMT:   WFL's  HAND FUNCTION: Grip strength: Right: 28.2 lbs; Left: 7 lbs,  Lateral pinch: Right: 6 lbs, Left: 4 lbs, and  3 point pinch: Right: 2 lbs, Left: 4 lbs 11/15/23: Grip: Rt = 20.7 lbs, Lt = 13.0 lbs   COORDINATION: 9 Hole Peg test: Right: 25.77 sec; Left: 26.51 sec  SENSATION: Numbness Lt long and ring fingers (volar and dorsal)   EDEMA: mild at joints (Rt > Lt long finger DIP joint w/ arthritic changes)   COGNITION: Overall cognitive status: WFL's, very verbose t/o evaluation and needs redirection  VISION: Subjective report: no changes Baseline vision: Wears glasses for reading only Visual history: none   PERCEPTION: Not tested  PRAXIS: Not tested  OBSERVATIONS: +Finklesteins for DeQuervains tenosynovitis Rt side, but also arthritic changes to Rt CMC joint; lesser Lt CMC joint. Pt also reports catching Lt long and ring finger (? Trigger finger) however did not see during evaluation as it doesn't always catch. Pt reports notices more after tight sustained grip                                                                                                                              TREATMENT:    Paraffin x 10 minutes for bilateral hands to address stiffness. No adverse reactions.   Assessed remaining LTG's and progress to date (see below):  Quick Dash: 9.09% deficit  Grip: Rt = 30.8 lbs,   Lt = 32.8 lbs  Fabricated and fitted thumb spica splint to support Rt thumb and wrist per pt report. Issued splint and reviewed wear and care. Pt instructed that softer pre-fab brace would be sufficient but pt wanted therapist to make one as she will be returning to school and more activity next week.  Pt instructed only to wear during aggravating activities and/or when pain is worse during the day.    PATIENT EDUCATION: Education  details: splint wear and care Person educated: Patient Education method: Explanation, Demonstration, and Verbal cues Education comprehension: verbalized understanding and verbal cues required  HOME EXERCISE PROGRAM: 10/27/23: joint protection techniques, A/E recommendations 11/02/23: A/ROM HEP, brace/splint recommendations 11/08/23: CTS conservative management handout including ex's 11/10/23: EC strategies 11/15/23: putty HEP   GOALS: Goals reviewed with patient? Yes  SHORT TERM GOALS: Target date: 11/18/23  Pt to verbalize understanding with joint protection techniques, task modifications, and possible A/E needs to reduce pain and protect joints Baseline: not yet addressed/issued Goal status: MET  2.  Pt to  verbalize understanding with EC techniques and sleep positioning techniques Baseline: not yet addressed/issued Goal status: MET  3.  Pt to be independent with ROM HEP (thumb separate from wrist) Baseline: not yet addressed/issued Goal status: MET   4.  Pain to reduce down to 8/10 or less bilateral hands Baseline: 10/10  Goal status: MET   LONG TERM GOALS: Target date: 12/19/23  Independent with updated strengthening HEP for bilateral hands Baseline: not yet addressed/issued Goal status: MET  2.  Pt to improve Rt grip by 5 lbs and Lt grip by 10 lbs or more to improve function and ease with opening tight jars/containers Baseline: Rt = 28 lbs, Lt = 7 lbs Goal status: PARTIALLY MET on Lt (11/24/23: Rt = 30, Lt = 32)   3.  Pt to improve lateral pinch strength bilaterally by 2 lbs or greater Baseline: Rt = 6 lbs, Lt = 4 lbs Goal status: MET (11/24/23: Rt = 8 lbs, Lt = 10)  4.  Pt to improve 3 tip pinch strength bilaterally by 2 lbs or greater Baseline: Rt = 2 lbs, Lt = 4 lbs Goal status:  MET (11/24/23: Rt = 6 lbs, Lt = 9 lbs)   5.  Pt to improve BUE deficits as evidenced by reducing deficit on Quick Dash by 10% Baseline: 41% deficit Goal status: MET (11/24/23: 9 %)     ASSESSMENT:  CLINICAL IMPRESSION:  Pt has met all STG's and LTG's. Pt politely requested splint today for Rt thumb and wrist support therefore will see one more session for splint check and adjustments prn.  SABRA  PERFORMANCE DEFICITS: in functional skills including ADLs, IADLs, coordination, dexterity, sensation, edema, ROM, strength, pain, Fine motor control, body mechanics, decreased knowledge of use of DME, and UE functional use.   IMPAIRMENTS: are limiting patient from ADLs, IADLs, rest and sleep, and leisure.   CO-MORBIDITIES: may have co-morbidities  that affects occupational performance. Patient will benefit from skilled OT to address above impairments and improve overall function.  REHAB POTENTIAL: Good  PLAN:  OT FREQUENCY: 1-2x/week  OT DURATION: 8 weeks  PLANNED INTERVENTIONS: 97535 self care/ADL training, 02889 therapeutic exercise, 97530 therapeutic activity, 97140 manual therapy, 97113 aquatic therapy, 97035 ultrasound, 97018 paraffin, 02960 fluidotherapy, 97010 moist heat, 97010 cryotherapy, 97034 contrast bath, 97760 Orthotic Initial, 97763 Orthotic/Prosthetic subsequent, passive range of motion, patient/family education, and DME and/or AE instructions  RECOMMENDED OTHER SERVICES: none at this time - currently seeing P.T.   CONSULTED AND AGREED WITH PLAN OF CARE: Patient  PLAN FOR NEXT SESSION:  Splint adjustments prn, review any education prn, D/C next session   For all possible CPT codes, reference the Planned Interventions line above.     Check all conditions that are expected to impact treatment: {Conditions expected to impact treatment:Neurological condition and/or seizures and Psychological or psychiatric disorders   If treatment provided at initial evaluation, no treatment charged due to lack of authorization.        Burnard JINNY Roads, OT 11/24/2023, 1:28 PM

## 2023-12-07 ENCOUNTER — Ambulatory Visit: Admitting: Occupational Therapy

## 2023-12-07 ENCOUNTER — Encounter: Payer: Self-pay | Admitting: Occupational Therapy

## 2023-12-07 DIAGNOSIS — M79642 Pain in left hand: Secondary | ICD-10-CM

## 2023-12-07 DIAGNOSIS — M6281 Muscle weakness (generalized): Secondary | ICD-10-CM

## 2023-12-07 DIAGNOSIS — R208 Other disturbances of skin sensation: Secondary | ICD-10-CM

## 2023-12-07 DIAGNOSIS — M79641 Pain in right hand: Secondary | ICD-10-CM

## 2023-12-07 DIAGNOSIS — R29898 Other symptoms and signs involving the musculoskeletal system: Secondary | ICD-10-CM

## 2023-12-07 NOTE — Therapy (Signed)
 OUTPATIENT OCCUPATIONAL THERAPY NEURO TREATMENT/DISCHARGE   Patient Name: Margaret Owens MRN: [id number] DOB:Nov 08, 1970, 53 y.o., female Today's Date: 12/07/2023  PCP: Shelda Atlas, MD REFERRING PROVIDER: Carilyn Prentice FORBES, MD  OCCUPATIONAL THERAPY DISCHARGE SUMMARY  Visits from Start of Care: 10  Current functional level related to goals / functional outcomes: SEE BELOW   Remaining deficits: Mild pain occasionally Strength in hands   Education / Equipment: See below - pt issued HEPs, education in task modifications, joint protection, A/E recommendations, pain reduction strategies, and splint wear and care   Patient agrees to discharge. Patient goals were met. Patient is being discharged due to meeting the stated rehab goals..     END OF SESSION:  OT End of Session - 12/07/23 1321     Visit Number 10    Number of Visits 16    Date for OT Re-Evaluation 12/19/23    Authorization Type [employer] - Approved 16 O.T. visits    Authorization Time Period 10/27/23 - 12/29/23    Authorization - Number of Visits 16    OT Start Time 1320    OT Stop Time 1350    OT Time Calculation (min) 30 min    Activity Tolerance Patient tolerated treatment well    Behavior During Therapy WFL for tasks assessed/performed          Past Medical History:  Diagnosis Date   Anxiety    Arthritis    Asthma    Bursitis    Depression    GERD (gastroesophageal reflux disease)    Hypertension    Muscle spasms of both lower extremities    PTSD (post-traumatic stress disorder)    Sciatica    Seizures (HCC)    Uterine fibroid    Past Surgical History:  Procedure Laterality Date   colonoscopy     Patient Active Problem List   Diagnosis Date Noted   Gait disorder 08/19/2023   Positive ANA (antinuclear antibody) 12/03/2021   Urticaria 12/03/2021   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 12/03/2021   Spinal stenosis of lumbar region with radiculopathy 01/23/2021    COVID-19 vaccination declined 05/16/2020   Prediabetes 05/16/2020   Obesity (BMI 35.0-39.9 without comorbidity) 05/16/2020   Primary osteoarthritis of left knee 07/04/2018   Uterine fibroid 06/09/2017   Endometrial stromal nodule 06/09/2017    ONSET DATE: 09/13/2023 (referral date)   REFERRING DIAG: M79.641,M79.642 (ICD-10-CM) - Bilateral hand pain  THERAPY DIAG:  Pain in left hand  Pain in right hand  Other symptoms and signs involving the musculoskeletal system  Other disturbances of skin sensation  Muscle weakness (generalized)  Rationale for Evaluation and Treatment: Rehabilitation  SUBJECTIVE:   SUBJECTIVE STATEMENT: No pain in hands. (Pain in Lt knee today 9/10). The splint is going well - no adjustments needed.   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 No  FALLS: Has patient Margaret in last 6 months? No  Lives with: lives with their daughter Lives in: first floor apt Stairs: No Has following equipment at home: Counselling psychologist, Environmental consultant - 2 wheeled, and Shower bench Has following equipment at home: Single point cane and rollator  PLOF: Independent with basic ADLs and filing for disability  PATIENT GOALS: decrease hand pain and increase strength  OBJECTIVE:  Note: Objective measures  were completed at Evaluation unless otherwise noted.  DIAGNOSTIC FINDINGS: MRI of lumbar spine from 08/2020   IMPRESSION: 1. Degenerative changes of the lumbar spine with progressed anterolisthesis at L4-5 where there is mild-to-moderate spinal canal stenosis with narrowing of the bilateral subarticular zones, severe right and moderate left neural foraminal narrowing. 2. Moderate right and severe left neural foraminal narrowing at L5-S1, unchanged.  HAND DOMINANCE:  Right  ADLs: Eating: independent  Grooming: independent UB Dressing: independent  LB Dressing: independent  Toileting: independent Bathing: independent Tub Shower transfers: mod I w/ shower chair Equipment: Shower seat with back  IADLs: Shopping: uses Field seismologist (takes public transportation)  [employer]: pt does Meal Prep: independent Community mobility: pt does not currently drive (d/t finances)  Medication management: independent Handwriting: denies change  MOBILITY STATUS: sometimes walks with cane   FUNCTIONAL OUTCOME MEASURES: Quick Dash: 41% deficit  UPPER EXTREMITY ROM:  BUE AROM WFL's - more difficulty making tight fist with Lt hand   UPPER EXTREMITY MMT:   WFL's  HAND FUNCTION: Grip strength: Right: 28.2 lbs; Left: 7 lbs,  Lateral pinch: Right: 6 lbs, Left: 4 lbs, and  3 point pinch: Right: 2 lbs, Left: 4 lbs 11/15/23: Grip: Rt = 20.7 lbs, Lt = 13.0 lbs   COORDINATION: 9 Hole Peg test: Right: 25.77 sec; Left: 26.51 sec  SENSATION: Numbness Lt long and ring fingers (volar and dorsal)   EDEMA: mild at joints (Rt > Lt long finger DIP joint w/ arthritic changes)   COGNITION: Overall cognitive status: WFL's, very verbose t/o evaluation and needs redirection  VISION: Subjective report: no changes Baseline vision: Wears glasses for reading only Visual history: none   PERCEPTION: Not tested  PRAXIS: Not tested  OBSERVATIONS: +Finklesteins for DeQuervains tenosynovitis Rt side, but also arthritic changes to Rt CMC joint; lesser Lt CMC joint. Pt also reports catching Lt long and ring finger (? Trigger finger) however did not see during evaluation as it doesn't always catch. Pt reports notices more after tight sustained grip                                                                                                                              TREATMENT:    Pt reports thumb spica splint fabricated last session going well with no further  adjustments needed. Reviewed recommendations of wearing only during aggravating activities or if/when in more pain Rt thumb and wrist.   Reviewed strategies for reducing trigger finger symptoms including: ice, band aid method for bracing, and avoiding repetitive or sustained gripping and pinching. Recommended pt see orthopedic hand specialist if symptoms don't improve with these conservative measures.   Reviewed proper use of paraffin if pt should purchase paraffin machine in future. Pt advised not to use paraffin more than 2 times per day.  Also recommended pt look into using a pool at the [facility] or other facility to do previously issued ex's by P.T. (pt has participated in  aquatic therapy)  Answered any remaining questions pt had including: use of MCD visits and how many she had left for the year if she needed them for something else, however re-iterated that we do not need to use them all as she has met goals and all education addressed at this time.   Fluidotherapy (per pt request) x 12 minutes for both hands to address pain, swelling, and stiffness. No adverse reactions.       PATIENT EDUCATION: Education details: splint wear and care Person educated: Patient Education method: Explanation, Demonstration, and Verbal cues Education comprehension: verbalized understanding and verbal cues required  HOME EXERCISE PROGRAM: 10/27/23: joint protection techniques, A/E recommendations 11/02/23: A/ROM HEP, brace/splint recommendations 11/08/23: CTS conservative management handout including ex's 11/10/23: EC strategies 11/15/23: putty HEP   GOALS: Goals reviewed with patient? Yes  SHORT TERM GOALS: Target date: 11/18/23  Pt to verbalize understanding with joint protection techniques, task modifications, and possible A/E needs to reduce pain and protect joints Baseline: not yet addressed/issued Goal status: MET  2.  Pt to verbalize understanding with EC techniques and sleep positioning  techniques Baseline: not yet addressed/issued Goal status: MET  3.  Pt to be independent with ROM HEP (thumb separate from wrist) Baseline: not yet addressed/issued Goal status: MET   4.  Pain to reduce down to 8/10 or less bilateral hands Baseline: 10/10  Goal status: MET   LONG TERM GOALS: Target date: 12/19/23  Independent with updated strengthening HEP for bilateral hands Baseline: not yet addressed/issued Goal status: MET  2.  Pt to improve Rt grip by 5 lbs and Lt grip by 10 lbs or more to improve function and ease with opening tight jars/containers Baseline: Rt = 28 lbs, Lt = 7 lbs Goal status: PARTIALLY MET on Lt (11/24/23: Rt = 30, Lt = 32)   3.  Pt to improve lateral pinch strength bilaterally by 2 lbs or greater Baseline: Rt = 6 lbs, Lt = 4 lbs Goal status: MET (11/24/23: Rt = 8 lbs, Lt = 10)  4.  Pt to improve 3 tip pinch strength bilaterally by 2 lbs or greater Baseline: Rt = 2 lbs, Lt = 4 lbs Goal status:  MET (11/24/23: Rt = 6 lbs, Lt = 9 lbs)   5.  Pt to improve BUE deficits as evidenced by reducing deficit on Quick Dash by 10% Baseline: 41% deficit Goal status: MET (11/24/23: 9 %)    ASSESSMENT:  CLINICAL IMPRESSION:  Pt has met all STG's and LTG's. Pt has improved in overall function   PERFORMANCE DEFICITS: in functional skills including ADLs, IADLs, coordination, dexterity, sensation, edema, ROM, strength, pain, Fine motor control, body mechanics, decreased knowledge of use of DME, and UE functional use.   IMPAIRMENTS: are limiting patient from ADLs, IADLs, rest and sleep, and leisure.   CO-MORBIDITIES: may have co-morbidities  that affects occupational performance. Patient will benefit from skilled OT to address above impairments and improve overall function.  REHAB POTENTIAL: Good  PLAN:  OT FREQUENCY: 1-2x/week  OT DURATION: 8 weeks  PLANNED INTERVENTIONS: 97535 self care/ADL training, 02889 therapeutic exercise, 97530 therapeutic activity, 97140  manual therapy, 97113 aquatic therapy, 97035 ultrasound, 97018 paraffin, 02960 fluidotherapy, 97010 moist heat, 97010 cryotherapy, 97034 contrast bath, 97760 Orthotic Initial, 97763 Orthotic/Prosthetic subsequent, passive range of motion, patient/family education, and DME and/or AE instructions  RECOMMENDED OTHER SERVICES: none at this time - currently seeing P.T.   CONSULTED AND AGREED WITH PLAN OF CARE: Patient  PLAN  D/C  Margaret Owens, OT 12/07/2023, 1:50 PM

## 2024-01-27 ENCOUNTER — Ambulatory Visit

## 2024-01-27 DIAGNOSIS — Z23 Encounter for immunization: Secondary | ICD-10-CM

## 2024-01-27 NOTE — Congregational Nurse Program (Signed)
 Seen today in [facility] for flu vaccine.  Was asked if she wanted to see [facility] for counseling.  Reports she is seeking independent agency for counseling.  Has not begun yet.  Will followup to see if appt completed.  Merlynn Pines, RN, CN, [phone].

## 2024-02-04 ENCOUNTER — Encounter: Payer: Self-pay | Admitting: Physical Medicine & Rehabilitation

## 2024-02-04 ENCOUNTER — Encounter: Attending: Physical Medicine & Rehabilitation | Admitting: Physical Medicine & Rehabilitation

## 2024-02-04 VITALS — Ht 69.0 in | Wt 211.0 lb

## 2024-02-04 DIAGNOSIS — M25571 Pain in right ankle and joints of right foot: Secondary | ICD-10-CM | POA: Insufficient documentation

## 2024-02-04 NOTE — Patient Instructions (Signed)
  VISIT SUMMARY: Today, you were seen for recurrent lower back pain and right ankle tenderness. We discussed your symptoms and planned the next steps for your treatment.  YOUR PLAN: SCIATICA: You have recurrent right-sided sciatica symptoms. Your previous L3-L4 epidural injection provided relief for seven months. -We will obtain authorization for a repeat epidural injection.  RIGHT ANKLE PAIN: You have tenderness and pain in your right ankle, which may be due to arthritis or other abnormalities. -We will order an x-ray of your right ankle to assess the condition. -If you experience symptoms, we recommend using a Velcro ankle splint for support.  CHRONIC WRIST AND HAND PAIN: Your wrist and hand pain have improved with therapy and the use of a custom brace. -Continue using the custom brace and therapy as it has been beneficial.  FOLLOW-UP: We need to follow up on your spine injection and manage your condition based on the x-ray results. -Please go to [facility] for your right ankle x-ray. -We will provide you with a printout of the plan.                      Contains text generated by [employer]bridge.                                 Contains text generated by [employer]bridge.

## 2024-02-04 NOTE — Progress Notes (Signed)
 Subjective:    Patient ID: Margaret Owens, female    DOB: [birth year], 53 y.o.   MRN: [id number]  HPI CC:  Discussed the use of AI scribe software for clinical note transcription with the patient, who gave verbal consent to proceed.  History of Present Illness Margaret Owens is a 53 year old female who presents with recurrent lower back pain and right ankle tenderness.  She has been experiencing recurrent lower back pain over the past two months, described as 'aggravating' and 'sharp,' sometimes dull, primarily located on the right side of the lower back and tailbone area. She received an L3, L4 epidural injection in January, which provided relief for approximately seven months. No weakness or numbness in the right leg is reported.  In addition to her back pain, she has noticed tenderness in her right ankle over the past month. She recalls spraining this ankle twice in the past, once during elementary school and again while pregnant with her son, who is now 69 years old. The pain occurs 'all around the perimeter' of the ankle and at the back of the heel. She has not had recent x-rays of the ankle.  She has been undergoing therapy, including aqua therapy, which has been beneficial, particularly for her wrist and back. She uses a brace for her right hand, which has helped significantly.  No weakness or numbness in the right leg.   Pain Inventory Average Pain 5 Pain Right Now 6 My pain is intermittent, constant, dull, stabbing, and aching  In the last 24 hours, has pain interfered with the following? General activity 5 Relation with others 2 Enjoyment of life 5 What TIME of day is your pain at its worst? daytime and night Sleep (in general) Fair  Pain is worse with: bending, sitting, inactivity, standing, and some activites Pain improves with: heat/ice, therapy/exercise, pacing activities, medication, TENS, and injections Relief from Meds: 6  Family History  Problem Relation Age  of Onset   Osteoarthritis Mother    Breast cancer Maternal Grandmother        spread to lungs    Healthy Son    Healthy Son    Irregular heart beat Daughter    Asthma Daughter    Social History   Socioeconomic History   Marital status: Single    Spouse name: Not on file   Number of children: Not on file   Years of education: Not on file   Highest education level: Not on file  Occupational History   Not on file  Tobacco Use   Smoking status: Never    Passive exposure: Past   Smokeless tobacco: Never  Vaping Use   Vaping status: Never Used  Substance and Sexual Activity   Alcohol use: Not Currently    Comment: 1/week   Drug use: No   Sexual activity: Yes    Partners: Male    Birth control/protection: None  Other Topics Concern   Not on file  Social History Narrative   Not on file   Social Drivers of Health   Financial Resource Strain: Not on file  Food Insecurity: Food Insecurity Present (05/21/2023)   Hunger Vital Sign    Worried About Running Out of Food in the Last Year: Sometimes true    Ran Out of Food in the Last Year: Sometimes true  Transportation Needs: No Transportation Needs (05/21/2023)   PRAPARE - Administrator, Civil Service (Medical): No    Lack of Transportation (Non-Medical):  No  Physical Activity: Not on file  Stress: Not on file  Social Connections: Not on file   Past Surgical History:  Procedure Laterality Date   colonoscopy     Past Surgical History:  Procedure Laterality Date   colonoscopy     Past Medical History:  Diagnosis Date   Anxiety    Arthritis    Asthma    Bursitis    Depression    GERD (gastroesophageal reflux disease)    Hypertension    Muscle spasms of both lower extremities    PTSD (post-traumatic stress disorder)    Sciatica    Seizures (HCC)    Uterine fibroid    Ht 5' 9 (1.753 m)   Wt 211 lb (95.7 kg)   LMP 11/15/2021   BMI 31.16 kg/m   Opioid Risk Score:   Fall Risk Score:   `1  Depression screen [facility] 2/9     02/04/2024    3:11 PM 11/04/2023    1:59 PM 08/19/2023    2:46 PM 08/19/2023    2:40 PM 07/22/2023    1:49 PM 06/24/2023    1:38 PM 05/13/2023    1:49 PM  Depression screen PHQ 2/9  Decreased Interest 1 0 1 0 0 0 0  Down, Depressed, Hopeless 1 0 1 0 0 0 0  PHQ - 2 Score 2 0 2 0 0 0 0    Review of Systems  Musculoskeletal:  Positive for back pain and neck pain.       Right ankle pain, pain in both wrist and fingers, pain in both shoulders  All other systems reviewed and are negative.      Objective:   Physical Exam  Right ankle no pain with range of motion no swelling or deformity no tenderness to palpation no tenderness along the Achilles no pain with inversion or eversion no pain around the lateral or medial malleolus. Back has mild tenderness bilaterally at the lumbosacral paraspinal area Negative straight leg raise  Motor strength is 5/5 bilateral hip flexor knee extensor ankle dorsiflexion Sensation intact light touch bilateral lower extremities Ambulates without assistive device no evidence of toe drag or knee instability     Assessment & Plan:   Assessment and Plan Assessment & Plan Sciatica Recurrence of right-sided sciatica symptoms. Previous L3-L4 epidural injection provided relief for seven months. Eligible for repeat injection. - Obtain authorization for repeat epidural injection.  Right ankle pain Tenderness and pain in right ankle. X-ray needed to assess for arthritis or abnormalities. - Order x-ray of the right ankle. - Recommend Velcro ankle splint for support if symptomatic.  Chronic wrist and hand pain Improvement noted with therapy and custom brace use.  Follow-up Follow-up needed for spine injection and management based on x-ray results. - Send to [facility] for right ankle x-ray. - Provide printout of the plan.

## 2024-03-02 ENCOUNTER — Telehealth: Payer: Self-pay

## 2024-03-02 NOTE — Telephone Encounter (Signed)
Letter type

## 2024-03-07 ENCOUNTER — Encounter: Admitting: Physical Medicine & Rehabilitation

## 2024-04-17 NOTE — Progress Notes (Signed)
" °  PROCEDURE RECORD [facility]   Name: Margaret Owens DOB:02/16/1971 MRN: [id number]  Date:04/17/2024  Physician: Prentice Compton MD    Nurse/CMA: Tye Kitty  Allergies: Allergies[1]  Consent Signed: Yes.    Is patient diabetic? No.  CBG today? .  Pregnant: No. LMP: Patient's last menstrual period was 11/15/2021. (age [age])  Anticoagulants: no Anti-inflammatory: no Antibiotics: no  Procedure:Right L3-4 Epidural Steroid Injection  Position: Prone Start Time: 12:25 PM  End Time: 12:31 PM  Fluoro Time: 34  RN/CMA Lee. CMA [place] Perra MA    Time 11:58 am 12:38 PM    BP 119/70 122/81    Pulse 76 67    Respirations 16 16    O2 Sat 98 100    S/S 6 6    Pain Level 7/10 0/10     D/C home with transportation, patient A & O X 3, D/C instructions reviewed, and sits independently.           [1]  Allergies Allergen Reactions   Latex Hives   Penicillins Hives    Has patient had a PCN reaction causing immediate rash, facial/tongue/throat swelling, SOB or lightheadedness with hypotension: No  Has patient had a PCN reaction causing severe rash involving mucus membranes or skin necrosis: No  Has patient had a PCN reaction that required hospitalization No  Has patient had a PCN reaction occurring within the last 10 years: No  If all of the above answers are NO, then may proceed with Cephalosporin use.  Other Reaction(s): Unknown  Has patient had a PCN reaction causing immediate rash, facial/tongue/throat swelling, SOB or lightheadedness with hypotension: No, Has patient had a PCN reaction causing severe rash involving mucus membranes or skin necrosis: No, Has patient had a PCN reaction that required hospitalization No, Has patient had a PCN reaction occurring within the last 10 years: No, If all of the above answers are NO, then may proceed with Cephalosporin use., Has patient had a PCN reaction causing immediate rash, facial/   "

## 2024-04-18 ENCOUNTER — Encounter: Payer: Self-pay | Admitting: Physical Medicine & Rehabilitation

## 2024-04-18 ENCOUNTER — Encounter: Payer: Self-pay | Attending: Physical Medicine & Rehabilitation | Admitting: Physical Medicine & Rehabilitation

## 2024-04-18 VITALS — BP 119/70 | HR 76 | Temp 98.3°F | Ht 69.0 in | Wt 208.0 lb

## 2024-04-18 DIAGNOSIS — Z5181 Encounter for therapeutic drug level monitoring: Secondary | ICD-10-CM | POA: Diagnosis present

## 2024-04-18 DIAGNOSIS — Z79899 Other long term (current) drug therapy: Secondary | ICD-10-CM | POA: Insufficient documentation

## 2024-04-18 DIAGNOSIS — M48062 Spinal stenosis, lumbar region with neurogenic claudication: Secondary | ICD-10-CM | POA: Diagnosis not present

## 2024-04-18 DIAGNOSIS — G894 Chronic pain syndrome: Secondary | ICD-10-CM | POA: Insufficient documentation

## 2024-04-18 DIAGNOSIS — M1712 Unilateral primary osteoarthritis, left knee: Secondary | ICD-10-CM | POA: Diagnosis present

## 2024-04-18 MED ORDER — IOHEXOL 180 MG/ML  SOLN
3.0000 mL | Freq: Once | INTRAMUSCULAR | Status: AC
  Administered 2024-04-18: 3 mL via EPIDURAL

## 2024-04-18 MED ORDER — DEXAMETHASONE SOD PHOSPHATE PF 10 MG/ML IJ SOLN
10.0000 mg | Freq: Once | INTRAMUSCULAR | Status: AC
  Administered 2024-04-18: 10 mg via INTRAVENOUS

## 2024-04-18 MED ORDER — LIDOCAINE HCL (PF) 1 % IJ SOLN
2.0000 mL | Freq: Once | INTRAMUSCULAR | Status: AC
  Administered 2024-04-18: 2 mL

## 2024-04-18 MED ORDER — LIDOCAINE HCL 1 % IJ SOLN
5.0000 mL | Freq: Once | INTRAMUSCULAR | Status: AC
  Administered 2024-04-18: 5 mL

## 2024-04-18 NOTE — Patient Instructions (Signed)

## 2024-04-18 NOTE — Progress Notes (Signed)
 Right L3-L4 lumbar transforaminal epidural steroid injection under fluoroscopic guidance with contrast enhancement  Indication: Lumbosacral radiculitis is not relieved by medication management or other conservative care and interfering with self-care and mobility.   Informed consent was obtained after describing risk and benefits of the procedure with the patient, this includes bleeding, bruising, infection, paralysis and medication side effects.  The patient wishes to proceed and has given written consent.  Patient was placed in prone position.  The lumbar area was marked and prepped with Betadine.  It was entered with a 25-gauge 1-1/2 inch needle and one mL of 1% lidocaine  was injected into the skin and subcutaneous tissue.  Then a 22-gauge 5 inch spinal needle was inserted into the right L3-4 intervertebral foramen under AP, lateral, and oblique view.  Once needle tip was within the foramen on lateral views and or exceeding 6 o clock position on the pedicle on AP view, Omnipaque  180 was injected x 2ml Then a solution containing one mL of 10 mg per mL dexamethasone  and 2 mL of 1% lidocaine  was injected.  The patient tolerated procedure well.  Post procedure instructions were given.  Please see post procedure form. The patient's current physical medicine rehab physician Dr. Ernesta is retiring and wants to know whether we can prescribe her oxycodone .  I reviewed her PDMP information and she has been receiving oxycodone  IR 5 mg twice a day for the last several months.  No evidence of multiple prescribers. Denies history of alcohol or drug abuse or current use We discussed that she will need to come in for CSA as well as other screening.  Will see nurse practitioner in couple weeks.  She has enough medication till the end of this month

## 2024-05-02 ENCOUNTER — Encounter: Admitting: Registered Nurse

## 2024-05-02 ENCOUNTER — Encounter: Payer: Self-pay | Admitting: Registered Nurse

## 2024-05-02 VITALS — BP 136/83 | HR 92 | Ht 69.0 in | Wt 204.6 lb

## 2024-05-02 DIAGNOSIS — M48062 Spinal stenosis, lumbar region with neurogenic claudication: Secondary | ICD-10-CM | POA: Diagnosis not present

## 2024-05-02 DIAGNOSIS — G894 Chronic pain syndrome: Secondary | ICD-10-CM | POA: Diagnosis not present

## 2024-05-02 DIAGNOSIS — Z79899 Other long term (current) drug therapy: Secondary | ICD-10-CM

## 2024-05-02 DIAGNOSIS — M1712 Unilateral primary osteoarthritis, left knee: Secondary | ICD-10-CM | POA: Diagnosis not present

## 2024-05-02 DIAGNOSIS — Z5181 Encounter for therapeutic drug level monitoring: Secondary | ICD-10-CM

## 2024-05-02 NOTE — Progress Notes (Unsigned)
 "  Subjective:    Patient ID: Margaret Owens, female    DOB: 04-19-1970, 54 y.o.   MRN: [id number]  HPI: Margaret Owens is a 54 y.o. female who returns for follow up appointment for chronic pain and medication refill. states *** pain is located in  ***. rates pain ***. current exercise regime is walking and performing stretching exercises.  [name] Morphine  equivalent is *** MME.   Oral Swab was performed today.    Pain Inventory Average Pain 5-6 Pain Right Now 0 My pain is burning, dull, stabbing, tingling, and aching  In the last 24 hours, has pain interfered with the following? General activity 2 Relation with others 0 Enjoyment of life 0 What TIME of day is your pain at its worst? night Sleep (in general) Poor  Pain is worse with: walking, bending, sitting, inactivity, standing, and some activites Pain improves with: rest, heat/ice, therapy/exercise, medication, TENS, and injections Relief from Meds: 5-6  Family History  Problem Relation Age of Onset   Osteoarthritis Mother    Breast cancer Maternal Grandmother        spread to lungs    Healthy Son    Healthy Son    Irregular heart beat Daughter    Asthma Daughter    Social History   Socioeconomic History   Marital status: Single    Spouse name: Not on file   Number of children: Not on file   Years of education: Not on file   Highest education level: Not on file  Occupational History   Not on file  Tobacco Use   Smoking status: Never    Passive exposure: Past   Smokeless tobacco: Never  Vaping Use   Vaping status: Never Used  Substance and Sexual Activity   Alcohol use: Not Currently    Comment: 1/week   Drug use: No   Sexual activity: Yes    Partners: Male    Birth control/protection: None  Other Topics Concern   Not on file  Social History Narrative   Not on file   Social Drivers of Health   Tobacco Use: Low Risk (05/02/2024)   Patient History    Smoking Tobacco Use: Never    Smokeless  Tobacco Use: Never    Passive Exposure: Past  Financial Resource Strain: Not on file  Food Insecurity: Food Insecurity Present (05/21/2023)   Hunger Vital Sign    Worried About Running Out of Food in the Last Year: Sometimes true    Ran Out of Food in the Last Year: Sometimes true  Transportation Needs: No Transportation Needs (05/21/2023)   PRAPARE - Administrator, Civil Service (Medical): No    Lack of Transportation (Non-Medical): No  Physical Activity: Not on file  Stress: Not on file  Social Connections: Not on file  Depression (PHQ2-9): Low Risk (04/18/2024)   Depression (PHQ2-9)    PHQ-2 Score: 0  Alcohol Screen: Not on file  Housing: Low Risk (05/21/2023)   Housing Stability Vital Sign    Unable to Pay for Housing in the Last Year: No    Number of Times Moved in the Last Year: 0    Homeless in the Last Year: No  Utilities: Not At Risk (05/21/2023)   [employer] Utilities    Threatened with loss of utilities: No  Health Literacy: Not on file   Past Surgical History:  Procedure Laterality Date   colonoscopy     Past Surgical History:  Procedure Laterality Date  colonoscopy     Past Medical History:  Diagnosis Date   Anxiety    Arthritis    Asthma    Bursitis    Depression    GERD (gastroesophageal reflux disease)    Hypertension    Muscle spasms of both lower extremities    PTSD (post-traumatic stress disorder)    Sciatica    Seizures (HCC)    Uterine fibroid    BP 136/83 (BP Location: Left Arm, Patient Position: Sitting, Cuff Size: Large)   Pulse 92   Ht 5' 9 (1.753 m)   Wt 204 lb 9.6 oz (92.8 kg)   LMP 11/15/2021   SpO2 93%   BMI 30.21 kg/m   Opioid Risk Score:   Fall Risk Score:  `1  Depression screen [facility] 2/9     04/18/2024   12:05 PM 02/04/2024    3:11 PM 11/04/2023    1:59 PM 08/19/2023    2:46 PM 08/19/2023    2:40 PM 07/22/2023    1:49 PM 06/24/2023    1:38 PM  Depression screen PHQ 2/9  Decreased Interest 0 1 0 1 0 0 0  Down, Depressed,  Hopeless 0 1 0 1 0 0 0  PHQ - 2 Score 0 2 0 2 0 0 0      Review of Systems  Musculoskeletal:  Positive for arthralgias, back pain and myalgias.       Widespread back pain, bilateral arm pain, right thigh pain, left knee pain  All other systems reviewed and are negative.      Objective:   Physical Exam        Assessment & Plan:    "

## 2024-05-06 LAB — DRUG TOX MONITOR 1 W/CONF, ORAL FLD
" Methamphetamine": NEGATIVE ng/mL
Amphetamine: 66 ng/mL — ABNORMAL HIGH
Amphetamines: POSITIVE ng/mL — AB
Barbiturates: NEGATIVE ng/mL
Benzodiazepines: NEGATIVE ng/mL
Buprenorphine: NEGATIVE ng/mL
Cocaine: NEGATIVE ng/mL
Codeine: NEGATIVE ng/mL
Dihydrocodeine: NEGATIVE ng/mL
Fentanyl: NEGATIVE ng/mL
Heroin Metabolite: NEGATIVE ng/mL
Hydrocodone: NEGATIVE ng/mL
Hydromorphone: NEGATIVE ng/mL
MARIJUANA: NEGATIVE ng/mL
MDMA: NEGATIVE ng/mL
Meprobamate: NEGATIVE ng/mL
Methadone: NEGATIVE ng/mL
Morphine: NEGATIVE ng/mL
Nicotine Metabolite: NEGATIVE ng/mL
Norhydrocodone: NEGATIVE ng/mL
Noroxycodone: NEGATIVE ng/mL
Opiates: POSITIVE ng/mL — AB
Oxycodone: 4 ng/mL — ABNORMAL HIGH
Oxymorphone: NEGATIVE ng/mL
Phencyclidine: NEGATIVE ng/mL
Tapentadol: NEGATIVE ng/mL
Tramadol: NEGATIVE ng/mL
Zolpidem: NEGATIVE ng/mL

## 2024-05-06 LAB — DRUG TOX ALC METAB W/CON, ORAL FLD: Alcohol Metabolite: NEGATIVE ng/mL

## 2024-05-10 ENCOUNTER — Telehealth: Payer: Self-pay | Admitting: Registered Nurse

## 2024-05-10 NOTE — Telephone Encounter (Signed)
 Oral Swab results was reviewed: PDMP was Reviewed Results are Consistent.

## 2024-06-07 ENCOUNTER — Encounter: Admitting: Registered Nurse
